# Patient Record
Sex: Male | Born: 1955
Health system: Southern US, Community
[De-identification: ages and names within clinical notes are randomized; demographics above are authoritative.]

## PROBLEM LIST (undated history)

## (undated) DIAGNOSIS — E669 Obesity, unspecified: Secondary | ICD-10-CM

## (undated) DIAGNOSIS — E785 Hyperlipidemia, unspecified: Secondary | ICD-10-CM

## (undated) DIAGNOSIS — I10 Essential (primary) hypertension: Secondary | ICD-10-CM

## (undated) DIAGNOSIS — R0602 Shortness of breath: Secondary | ICD-10-CM

## (undated) DIAGNOSIS — I639 Cerebral infarction, unspecified: Secondary | ICD-10-CM

## (undated) DIAGNOSIS — G4700 Insomnia, unspecified: Secondary | ICD-10-CM

## (undated) DIAGNOSIS — F319 Bipolar disorder, unspecified: Secondary | ICD-10-CM

## (undated) DIAGNOSIS — R0902 Hypoxemia: Secondary | ICD-10-CM

## (undated) DIAGNOSIS — F1910 Other psychoactive substance abuse, uncomplicated: Secondary | ICD-10-CM

## (undated) DIAGNOSIS — K219 Gastro-esophageal reflux disease without esophagitis: Secondary | ICD-10-CM

## (undated) HISTORY — DX: Bipolar disorder, unspecified: F31.9

## (undated) HISTORY — DX: Other psychoactive substance abuse, uncomplicated: F19.10

## (undated) HISTORY — DX: Obesity, unspecified: E66.9

## (undated) HISTORY — DX: Gastro-esophageal reflux disease without esophagitis: K21.9

## (undated) HISTORY — DX: Hypoxemia: R09.02

## (undated) HISTORY — DX: Hyperlipidemia, unspecified: E78.5

## (undated) HISTORY — PX: TONSILLECTOMY: SUR1361

## (undated) HISTORY — DX: Essential (primary) hypertension: I10

## (undated) HISTORY — DX: Insomnia, unspecified: G47.00

## (undated) HISTORY — DX: Shortness of breath: R06.02

## (undated) HISTORY — PX: CARPAL TUNNEL RELEASE: SHX101

## (undated) HISTORY — DX: Cerebral infarction, unspecified: I63.9

---

## 2004-02-14 ENCOUNTER — Other Ambulatory Visit: Payer: Self-pay

## 2004-08-05 ENCOUNTER — Emergency Department: Payer: Self-pay | Admitting: Emergency Medicine

## 2006-04-27 ENCOUNTER — Ambulatory Visit: Payer: Self-pay | Admitting: Family Medicine

## 2010-04-08 ENCOUNTER — Inpatient Hospital Stay: Payer: Self-pay | Admitting: Psychiatry

## 2010-09-18 ENCOUNTER — Inpatient Hospital Stay: Payer: Self-pay | Admitting: Psychiatry

## 2011-02-21 ENCOUNTER — Emergency Department: Payer: Self-pay | Admitting: Emergency Medicine

## 2011-04-02 ENCOUNTER — Inpatient Hospital Stay: Payer: Self-pay | Admitting: Psychiatry

## 2011-04-11 ENCOUNTER — Inpatient Hospital Stay: Payer: Self-pay | Admitting: Psychiatry

## 2011-04-21 ENCOUNTER — Inpatient Hospital Stay: Payer: Self-pay | Admitting: Unknown Physician Specialty

## 2012-08-05 LAB — ETHANOL
Ethanol %: <0.003 % (ref 0.000–0.080)
Ethanol: <3 mg/dL

## 2012-08-05 LAB — COMPREHENSIVE METABOLIC PANEL
Albumin: 3.9 g/dL (ref 3.4–5.0)
Alkaline Phosphatase: 164 U/L — ABNORMAL HIGH (ref 50–136)
Anion Gap: 10 (ref 7–16)
BUN: 7 mg/dL (ref 7–18)
Bilirubin,Total: 0.2 mg/dL (ref 0.2–1.0)
Calcium, Total: 8.4 mg/dL — ABNORMAL LOW (ref 8.5–10.1)
Chloride: 102 mmol/L (ref 98–107)
Co2: 23 mmol/L (ref 21–32)
Creatinine: 0.84 mg/dL (ref 0.60–1.30)
EGFR (African American): >60
EGFR (Non-African Amer.): >60
Glucose: 88 mg/dL (ref 65–99)
Osmolality: 267 (ref 275–301)
Potassium: 3.5 mmol/L (ref 3.5–5.1)
SGOT(AST): 16 U/L (ref 15–37)
SGPT (ALT): 19 U/L (ref 12–78)
Sodium: 135 mmol/L — ABNORMAL LOW (ref 136–145)
Total Protein: 7.9 g/dL (ref 6.4–8.2)

## 2012-08-05 LAB — CBC
HCT: 40.4 % (ref 40.0–52.0)
HGB: 13.8 g/dL (ref 13.0–18.0)
MCH: 29.9 pg (ref 26.0–34.0)
MCHC: 34.3 g/dL (ref 32.0–36.0)
MCV: 87 fL (ref 80–100)
Platelet: 250 10*3/uL (ref 150–440)
RBC: 4.63 10*6/uL (ref 4.40–5.90)
RDW: 13.8 % (ref 11.5–14.5)
WBC: 13.7 10*3/uL — ABNORMAL HIGH (ref 3.8–10.6)

## 2012-08-05 LAB — CK TOTAL AND CKMB (NOT AT ARMC)
CK, Total: 94 U/L (ref 35–232)
CK-MB: 1.2 ng/mL (ref 0.5–3.6)

## 2012-08-05 LAB — TSH: Thyroid Stimulating Horm: 0.78 u[IU]/mL

## 2012-08-05 LAB — TROPONIN I: Troponin-I: <0.02 ng/mL

## 2012-08-06 ENCOUNTER — Inpatient Hospital Stay: Payer: Self-pay | Admitting: Psychiatry

## 2012-08-06 LAB — URINALYSIS, COMPLETE
Bacteria: NONE SEEN
Bilirubin,UR: NEGATIVE
Blood: NEGATIVE
Glucose,UR: NEGATIVE mg/dL (ref 0–75)
Hyaline Cast: 1
Ketone: NEGATIVE
Leukocyte Esterase: NEGATIVE
Nitrite: NEGATIVE
Ph: 7 (ref 4.5–8.0)
Protein: 30
RBC,UR: 1 /HPF (ref 0–5)
Specific Gravity: 1.011 (ref 1.003–1.030)
Squamous Epithelial: NONE SEEN
WBC UR: 1 /HPF (ref 0–5)

## 2012-08-06 LAB — DRUG SCREEN, URINE
Amphetamines, Ur Screen: NEGATIVE (ref ?–1000)
Barbiturates, Ur Screen: NEGATIVE (ref ?–200)
Benzodiazepine, Ur Scrn: NEGATIVE (ref ?–200)
Cannabinoid 50 Ng, Ur ~~LOC~~: NEGATIVE (ref ?–50)
Cocaine Metabolite,Ur ~~LOC~~: POSITIVE (ref ?–300)
MDMA (Ecstasy)Ur Screen: NEGATIVE (ref ?–500)
Methadone, Ur Screen: NEGATIVE (ref ?–300)
Opiate, Ur Screen: NEGATIVE (ref ?–300)
Phencyclidine (PCP) Ur S: NEGATIVE (ref ?–25)
Tricyclic, Ur Screen: NEGATIVE (ref ?–1000)

## 2012-08-06 LAB — CK TOTAL AND CKMB (NOT AT ARMC)
CK, Total: 85 U/L (ref 35–232)
CK-MB: 1.3 ng/mL (ref 0.5–3.6)

## 2012-08-06 LAB — TROPONIN I: Troponin-I: <0.02 ng/mL

## 2012-08-09 DIAGNOSIS — I639 Cerebral infarction, unspecified: Secondary | ICD-10-CM

## 2012-08-09 HISTORY — DX: Cerebral infarction, unspecified: I63.9

## 2013-03-23 DIAGNOSIS — I10 Essential (primary) hypertension: Secondary | ICD-10-CM | POA: Diagnosis not present

## 2013-03-23 DIAGNOSIS — H60399 Other infective otitis externa, unspecified ear: Secondary | ICD-10-CM | POA: Diagnosis not present

## 2013-03-23 DIAGNOSIS — R000 Tachycardia, unspecified: Secondary | ICD-10-CM | POA: Diagnosis not present

## 2013-03-30 DIAGNOSIS — H60399 Other infective otitis externa, unspecified ear: Secondary | ICD-10-CM | POA: Diagnosis not present

## 2013-03-30 DIAGNOSIS — R5381 Other malaise: Secondary | ICD-10-CM | POA: Diagnosis not present

## 2013-03-30 DIAGNOSIS — R7309 Other abnormal glucose: Secondary | ICD-10-CM | POA: Diagnosis not present

## 2013-03-30 DIAGNOSIS — I10 Essential (primary) hypertension: Secondary | ICD-10-CM | POA: Diagnosis not present

## 2013-04-26 DIAGNOSIS — R5381 Other malaise: Secondary | ICD-10-CM | POA: Diagnosis not present

## 2013-04-26 DIAGNOSIS — I10 Essential (primary) hypertension: Secondary | ICD-10-CM | POA: Diagnosis not present

## 2013-05-02 DIAGNOSIS — F39 Unspecified mood [affective] disorder: Secondary | ICD-10-CM | POA: Diagnosis not present

## 2013-05-04 DIAGNOSIS — Z23 Encounter for immunization: Secondary | ICD-10-CM | POA: Diagnosis not present

## 2013-05-04 DIAGNOSIS — J069 Acute upper respiratory infection, unspecified: Secondary | ICD-10-CM | POA: Diagnosis not present

## 2013-05-04 DIAGNOSIS — E785 Hyperlipidemia, unspecified: Secondary | ICD-10-CM | POA: Diagnosis not present

## 2013-05-04 DIAGNOSIS — I10 Essential (primary) hypertension: Secondary | ICD-10-CM | POA: Diagnosis not present

## 2013-05-04 DIAGNOSIS — R7309 Other abnormal glucose: Secondary | ICD-10-CM | POA: Diagnosis not present

## 2013-05-16 DIAGNOSIS — F39 Unspecified mood [affective] disorder: Secondary | ICD-10-CM | POA: Diagnosis not present

## 2013-09-14 DIAGNOSIS — I10 Essential (primary) hypertension: Secondary | ICD-10-CM | POA: Diagnosis not present

## 2013-09-14 DIAGNOSIS — E785 Hyperlipidemia, unspecified: Secondary | ICD-10-CM | POA: Diagnosis not present

## 2013-09-14 DIAGNOSIS — R079 Chest pain, unspecified: Secondary | ICD-10-CM | POA: Diagnosis not present

## 2013-09-14 DIAGNOSIS — R7309 Other abnormal glucose: Secondary | ICD-10-CM | POA: Diagnosis not present

## 2013-09-17 DIAGNOSIS — R079 Chest pain, unspecified: Secondary | ICD-10-CM | POA: Diagnosis not present

## 2013-09-19 DIAGNOSIS — I10 Essential (primary) hypertension: Secondary | ICD-10-CM | POA: Diagnosis not present

## 2013-09-19 DIAGNOSIS — J449 Chronic obstructive pulmonary disease, unspecified: Secondary | ICD-10-CM | POA: Diagnosis not present

## 2013-09-19 DIAGNOSIS — R079 Chest pain, unspecified: Secondary | ICD-10-CM | POA: Diagnosis not present

## 2013-09-20 ENCOUNTER — Inpatient Hospital Stay: Payer: Self-pay | Admitting: Internal Medicine

## 2013-09-20 DIAGNOSIS — R55 Syncope and collapse: Secondary | ICD-10-CM | POA: Diagnosis not present

## 2013-09-20 DIAGNOSIS — M79609 Pain in unspecified limb: Secondary | ICD-10-CM | POA: Diagnosis not present

## 2013-09-20 DIAGNOSIS — F17200 Nicotine dependence, unspecified, uncomplicated: Secondary | ICD-10-CM | POA: Diagnosis not present

## 2013-09-20 DIAGNOSIS — I10 Essential (primary) hypertension: Secondary | ICD-10-CM | POA: Diagnosis not present

## 2013-09-20 DIAGNOSIS — I2699 Other pulmonary embolism without acute cor pulmonale: Secondary | ICD-10-CM | POA: Diagnosis not present

## 2013-09-20 DIAGNOSIS — E785 Hyperlipidemia, unspecified: Secondary | ICD-10-CM | POA: Diagnosis not present

## 2013-09-20 DIAGNOSIS — R0789 Other chest pain: Secondary | ICD-10-CM | POA: Diagnosis not present

## 2013-09-20 DIAGNOSIS — J449 Chronic obstructive pulmonary disease, unspecified: Secondary | ICD-10-CM | POA: Diagnosis not present

## 2013-09-20 DIAGNOSIS — I200 Unstable angina: Secondary | ICD-10-CM | POA: Diagnosis not present

## 2013-09-20 DIAGNOSIS — R079 Chest pain, unspecified: Secondary | ICD-10-CM | POA: Diagnosis not present

## 2013-09-20 LAB — CBC WITH DIFFERENTIAL/PLATELET
Basophil #: 0.2 10*3/uL — ABNORMAL HIGH (ref 0.0–0.1)
Basophil %: 2.7 %
Eosinophil #: 0.5 10*3/uL (ref 0.0–0.7)
Eosinophil %: 5.7 %
HCT: 37.2 % — ABNORMAL LOW (ref 40.0–52.0)
HGB: 12.8 g/dL — ABNORMAL LOW (ref 13.0–18.0)
Lymphocyte #: 2.3 10*3/uL (ref 1.0–3.6)
Lymphocyte %: 25.4 %
MCH: 30.5 pg (ref 26.0–34.0)
MCHC: 34.4 g/dL (ref 32.0–36.0)
MCV: 88 fL (ref 80–100)
Monocyte #: 0.9 x10 3/mm (ref 0.2–1.0)
Monocyte %: 10.4 %
Neutrophil #: 5 10*3/uL (ref 1.4–6.5)
Neutrophil %: 55.8 %
Platelet: 237 10*3/uL (ref 150–440)
RBC: 4.21 10*6/uL — ABNORMAL LOW (ref 4.40–5.90)
RDW: 15.2 % — ABNORMAL HIGH (ref 11.5–14.5)
WBC: 9 10*3/uL (ref 3.8–10.6)

## 2013-09-20 LAB — DRUG SCREEN, URINE
Amphetamines, Ur Screen: NEGATIVE (ref ?–1000)
Barbiturates, Ur Screen: NEGATIVE (ref ?–200)
Benzodiazepine, Ur Scrn: POSITIVE (ref ?–200)
Cannabinoid 50 Ng, Ur ~~LOC~~: NEGATIVE (ref ?–50)
Cocaine Metabolite,Ur ~~LOC~~: POSITIVE (ref ?–300)
MDMA (Ecstasy)Ur Screen: NEGATIVE (ref ?–500)
Methadone, Ur Screen: NEGATIVE (ref ?–300)
Opiate, Ur Screen: NEGATIVE (ref ?–300)
Phencyclidine (PCP) Ur S: NEGATIVE (ref ?–25)
Tricyclic, Ur Screen: NEGATIVE (ref ?–1000)

## 2013-09-20 LAB — COMPREHENSIVE METABOLIC PANEL
Albumin: 3.5 g/dL (ref 3.4–5.0)
Alkaline Phosphatase: 107 U/L
Anion Gap: 7 (ref 7–16)
BUN: 16 mg/dL (ref 7–18)
Bilirubin,Total: 0.2 mg/dL (ref 0.2–1.0)
Calcium, Total: 8.5 mg/dL (ref 8.5–10.1)
Chloride: 105 mmol/L (ref 98–107)
Co2: 26 mmol/L (ref 21–32)
Creatinine: 1.01 mg/dL (ref 0.60–1.30)
EGFR (African American): >60
EGFR (Non-African Amer.): >60
Glucose: 81 mg/dL (ref 65–99)
Osmolality: 276 (ref 275–301)
Potassium: 3.5 mmol/L (ref 3.5–5.1)
SGOT(AST): 9 U/L — ABNORMAL LOW (ref 15–37)
SGPT (ALT): 16 U/L (ref 12–78)
Sodium: 138 mmol/L (ref 136–145)
Total Protein: 7.3 g/dL (ref 6.4–8.2)

## 2013-09-20 LAB — TROPONIN I
Troponin-I: <0.02 ng/mL
Troponin-I: <0.02 ng/mL
Troponin-I: <0.02 ng/mL

## 2013-09-20 LAB — URINALYSIS, COMPLETE
Bacteria: NONE SEEN
Bilirubin,UR: NEGATIVE
Blood: NEGATIVE
Glucose,UR: NEGATIVE mg/dL (ref 0–75)
Ketone: NEGATIVE
Leukocyte Esterase: NEGATIVE
Nitrite: NEGATIVE
Ph: 6 (ref 4.5–8.0)
Protein: NEGATIVE
RBC,UR: NONE SEEN /HPF (ref 0–5)
Specific Gravity: 1.01 (ref 1.003–1.030)
Squamous Epithelial: NONE SEEN
WBC UR: NONE SEEN /HPF (ref 0–5)

## 2013-09-20 LAB — APTT
Activated PTT: 39.2 secs — ABNORMAL HIGH (ref 23.6–35.9)
Activated PTT: 42.8 secs — ABNORMAL HIGH (ref 23.6–35.9)

## 2013-09-20 LAB — PRO B NATRIURETIC PEPTIDE: B-Type Natriuretic Peptide: 1167 pg/mL — ABNORMAL HIGH (ref 0–125)

## 2013-09-21 DIAGNOSIS — I2699 Other pulmonary embolism without acute cor pulmonale: Secondary | ICD-10-CM | POA: Diagnosis not present

## 2013-09-21 DIAGNOSIS — R079 Chest pain, unspecified: Secondary | ICD-10-CM | POA: Diagnosis not present

## 2013-09-21 DIAGNOSIS — R222 Localized swelling, mass and lump, trunk: Secondary | ICD-10-CM | POA: Diagnosis not present

## 2013-09-21 DIAGNOSIS — R0602 Shortness of breath: Secondary | ICD-10-CM | POA: Diagnosis not present

## 2013-09-21 DIAGNOSIS — I200 Unstable angina: Secondary | ICD-10-CM | POA: Diagnosis not present

## 2013-09-21 DIAGNOSIS — J9600 Acute respiratory failure, unspecified whether with hypoxia or hypercapnia: Secondary | ICD-10-CM | POA: Diagnosis not present

## 2013-09-21 LAB — LIPID PANEL
Cholesterol: 210 mg/dL — ABNORMAL HIGH (ref 0–200)
HDL Cholesterol: 59 mg/dL (ref 40–60)
Ldl Cholesterol, Calc: 130 mg/dL — ABNORMAL HIGH (ref 0–100)
Triglycerides: 106 mg/dL (ref 0–200)
VLDL Cholesterol, Calc: 21 mg/dL (ref 5–40)

## 2013-09-21 LAB — BASIC METABOLIC PANEL
Anion Gap: 4 — ABNORMAL LOW (ref 7–16)
BUN: 17 mg/dL (ref 7–18)
Calcium, Total: 8.5 mg/dL (ref 8.5–10.1)
Chloride: 103 mmol/L (ref 98–107)
Co2: 28 mmol/L (ref 21–32)
Creatinine: 1.12 mg/dL (ref 0.60–1.30)
EGFR (African American): >60
EGFR (Non-African Amer.): >60
Glucose: 146 mg/dL — ABNORMAL HIGH (ref 65–99)
Osmolality: 274 (ref 275–301)
Potassium: 4.1 mmol/L (ref 3.5–5.1)
Sodium: 135 mmol/L — ABNORMAL LOW (ref 136–145)

## 2013-09-21 LAB — CBC WITH DIFFERENTIAL/PLATELET
Basophil #: 0.1 10*3/uL (ref 0.0–0.1)
Basophil %: 1.1 %
Eosinophil #: 0 10*3/uL (ref 0.0–0.7)
Eosinophil %: 0 %
HCT: 36.4 % — ABNORMAL LOW (ref 40.0–52.0)
HGB: 12.2 g/dL — ABNORMAL LOW (ref 13.0–18.0)
Lymphocyte #: 1.1 10*3/uL (ref 1.0–3.6)
Lymphocyte %: 9.7 %
MCH: 29.7 pg (ref 26.0–34.0)
MCHC: 33.3 g/dL (ref 32.0–36.0)
MCV: 89 fL (ref 80–100)
Monocyte #: 0.3 x10 3/mm (ref 0.2–1.0)
Monocyte %: 2.3 %
Neutrophil #: 9.8 10*3/uL — ABNORMAL HIGH (ref 1.4–6.5)
Neutrophil %: 86.9 %
Platelet: 250 10*3/uL (ref 150–440)
RBC: 4.09 10*6/uL — ABNORMAL LOW (ref 4.40–5.90)
RDW: 15.6 % — ABNORMAL HIGH (ref 11.5–14.5)
WBC: 11.3 10*3/uL — ABNORMAL HIGH (ref 3.8–10.6)

## 2013-09-21 LAB — APTT
Activated PTT: 54.2 secs — ABNORMAL HIGH (ref 23.6–35.9)
Activated PTT: 45.8 secs — ABNORMAL HIGH (ref 23.6–35.9)

## 2013-09-24 DIAGNOSIS — E785 Hyperlipidemia, unspecified: Secondary | ICD-10-CM | POA: Diagnosis not present

## 2013-09-24 DIAGNOSIS — R7309 Other abnormal glucose: Secondary | ICD-10-CM | POA: Diagnosis not present

## 2013-09-24 DIAGNOSIS — M949 Disorder of cartilage, unspecified: Secondary | ICD-10-CM | POA: Diagnosis not present

## 2013-09-24 DIAGNOSIS — F209 Schizophrenia, unspecified: Secondary | ICD-10-CM | POA: Diagnosis not present

## 2013-09-24 DIAGNOSIS — R079 Chest pain, unspecified: Secondary | ICD-10-CM | POA: Diagnosis not present

## 2013-09-24 DIAGNOSIS — Z79899 Other long term (current) drug therapy: Secondary | ICD-10-CM | POA: Diagnosis not present

## 2013-09-24 DIAGNOSIS — R0789 Other chest pain: Secondary | ICD-10-CM | POA: Diagnosis not present

## 2013-09-24 DIAGNOSIS — F17200 Nicotine dependence, unspecified, uncomplicated: Secondary | ICD-10-CM | POA: Diagnosis not present

## 2013-09-24 DIAGNOSIS — J441 Chronic obstructive pulmonary disease with (acute) exacerbation: Secondary | ICD-10-CM | POA: Diagnosis not present

## 2013-09-24 DIAGNOSIS — Z86711 Personal history of pulmonary embolism: Secondary | ICD-10-CM | POA: Diagnosis not present

## 2013-09-24 DIAGNOSIS — F319 Bipolar disorder, unspecified: Secondary | ICD-10-CM | POA: Diagnosis not present

## 2013-09-24 DIAGNOSIS — I10 Essential (primary) hypertension: Secondary | ICD-10-CM | POA: Diagnosis not present

## 2013-09-24 DIAGNOSIS — R0602 Shortness of breath: Secondary | ICD-10-CM | POA: Diagnosis not present

## 2013-09-24 DIAGNOSIS — R918 Other nonspecific abnormal finding of lung field: Secondary | ICD-10-CM | POA: Diagnosis not present

## 2013-09-24 DIAGNOSIS — I2699 Other pulmonary embolism without acute cor pulmonale: Secondary | ICD-10-CM | POA: Diagnosis not present

## 2013-09-24 DIAGNOSIS — M899 Disorder of bone, unspecified: Secondary | ICD-10-CM | POA: Diagnosis not present

## 2013-09-24 DIAGNOSIS — J449 Chronic obstructive pulmonary disease, unspecified: Secondary | ICD-10-CM | POA: Diagnosis not present

## 2013-09-26 ENCOUNTER — Inpatient Hospital Stay: Payer: Self-pay | Admitting: Internal Medicine

## 2013-09-26 DIAGNOSIS — I6350 Cerebral infarction due to unspecified occlusion or stenosis of unspecified cerebral artery: Secondary | ICD-10-CM | POA: Diagnosis not present

## 2013-09-26 DIAGNOSIS — I10 Essential (primary) hypertension: Secondary | ICD-10-CM | POA: Diagnosis not present

## 2013-09-26 DIAGNOSIS — R29818 Other symptoms and signs involving the nervous system: Secondary | ICD-10-CM | POA: Diagnosis not present

## 2013-09-26 DIAGNOSIS — I6529 Occlusion and stenosis of unspecified carotid artery: Secondary | ICD-10-CM | POA: Diagnosis not present

## 2013-09-26 DIAGNOSIS — R4789 Other speech disturbances: Secondary | ICD-10-CM | POA: Diagnosis not present

## 2013-09-26 DIAGNOSIS — R5381 Other malaise: Secondary | ICD-10-CM | POA: Diagnosis not present

## 2013-09-26 DIAGNOSIS — I658 Occlusion and stenosis of other precerebral arteries: Secondary | ICD-10-CM | POA: Diagnosis not present

## 2013-09-26 DIAGNOSIS — M6281 Muscle weakness (generalized): Secondary | ICD-10-CM | POA: Diagnosis not present

## 2013-09-26 LAB — CBC
HCT: 36.3 % — ABNORMAL LOW (ref 40.0–52.0)
HGB: 12.5 g/dL — ABNORMAL LOW (ref 13.0–18.0)
MCH: 30.7 pg (ref 26.0–34.0)
MCHC: 34.4 g/dL (ref 32.0–36.0)
MCV: 89 fL (ref 80–100)
Platelet: 230 10*3/uL (ref 150–440)
RBC: 4.06 10*6/uL — ABNORMAL LOW (ref 4.40–5.90)
RDW: 15.4 % — ABNORMAL HIGH (ref 11.5–14.5)
WBC: 9.3 10*3/uL (ref 3.8–10.6)

## 2013-09-26 LAB — DRUG SCREEN, URINE
Amphetamines, Ur Screen: NEGATIVE (ref ?–1000)
Barbiturates, Ur Screen: NEGATIVE (ref ?–200)
Benzodiazepine, Ur Scrn: POSITIVE (ref ?–200)
Cannabinoid 50 Ng, Ur ~~LOC~~: NEGATIVE (ref ?–50)
Cocaine Metabolite,Ur ~~LOC~~: POSITIVE (ref ?–300)
MDMA (Ecstasy)Ur Screen: NEGATIVE (ref ?–500)
Methadone, Ur Screen: NEGATIVE (ref ?–300)
Opiate, Ur Screen: NEGATIVE (ref ?–300)
Phencyclidine (PCP) Ur S: NEGATIVE (ref ?–25)
Tricyclic, Ur Screen: NEGATIVE (ref ?–1000)

## 2013-09-26 LAB — URINALYSIS, COMPLETE
Bacteria: NONE SEEN
Bilirubin,UR: NEGATIVE
Blood: NEGATIVE
Glucose,UR: NEGATIVE mg/dL (ref 0–75)
Hyaline Cast: 1
Ketone: NEGATIVE
Leukocyte Esterase: NEGATIVE
Nitrite: NEGATIVE
Ph: 6 (ref 4.5–8.0)
Protein: NEGATIVE
RBC,UR: 1 /HPF (ref 0–5)
Specific Gravity: 1.015 (ref 1.003–1.030)
Squamous Epithelial: NONE SEEN
WBC UR: 1 /HPF (ref 0–5)

## 2013-09-26 LAB — PROTIME-INR
INR: 1
Prothrombin Time: 12.9 secs (ref 11.5–14.7)

## 2013-09-26 LAB — COMPREHENSIVE METABOLIC PANEL
Albumin: 3.4 g/dL (ref 3.4–5.0)
Alkaline Phosphatase: 108 U/L
Anion Gap: 3 — ABNORMAL LOW (ref 7–16)
BUN: 11 mg/dL (ref 7–18)
Bilirubin,Total: 0.1 mg/dL — ABNORMAL LOW (ref 0.2–1.0)
Calcium, Total: 8.2 mg/dL — ABNORMAL LOW (ref 8.5–10.1)
Chloride: 106 mmol/L (ref 98–107)
Co2: 30 mmol/L (ref 21–32)
Creatinine: 1.12 mg/dL (ref 0.60–1.30)
EGFR (African American): >60
EGFR (Non-African Amer.): >60
Glucose: 86 mg/dL (ref 65–99)
Osmolality: 276 (ref 275–301)
Potassium: 3.3 mmol/L — ABNORMAL LOW (ref 3.5–5.1)
SGOT(AST): 19 U/L (ref 15–37)
SGPT (ALT): 15 U/L (ref 12–78)
Sodium: 139 mmol/L (ref 136–145)
Total Protein: 6.9 g/dL (ref 6.4–8.2)

## 2013-09-26 LAB — TROPONIN I: Troponin-I: <0.02 ng/mL

## 2013-09-27 LAB — BASIC METABOLIC PANEL
Anion Gap: 4 — ABNORMAL LOW (ref 7–16)
BUN: 8 mg/dL (ref 7–18)
Calcium, Total: 8.6 mg/dL (ref 8.5–10.1)
Chloride: 107 mmol/L (ref 98–107)
Co2: 27 mmol/L (ref 21–32)
Creatinine: 0.95 mg/dL (ref 0.60–1.30)
EGFR (African American): >60
EGFR (Non-African Amer.): >60
Glucose: 82 mg/dL (ref 65–99)
Osmolality: 273 (ref 275–301)
Potassium: 3.6 mmol/L (ref 3.5–5.1)
Sodium: 138 mmol/L (ref 136–145)

## 2013-09-27 LAB — CBC WITH DIFFERENTIAL/PLATELET
Basophil #: 0.1 10*3/uL (ref 0.0–0.1)
Basophil %: 0.6 %
Eosinophil #: 0.3 10*3/uL (ref 0.0–0.7)
Eosinophil %: 3.7 %
HCT: 37 % — ABNORMAL LOW (ref 40.0–52.0)
HGB: 12.6 g/dL — ABNORMAL LOW (ref 13.0–18.0)
Lymphocyte #: 1.7 10*3/uL (ref 1.0–3.6)
Lymphocyte %: 18.3 %
MCH: 30.3 pg (ref 26.0–34.0)
MCHC: 34.1 g/dL (ref 32.0–36.0)
MCV: 89 fL (ref 80–100)
Monocyte #: 0.8 x10 3/mm (ref 0.2–1.0)
Monocyte %: 8.3 %
Neutrophil #: 6.4 10*3/uL (ref 1.4–6.5)
Neutrophil %: 69.1 %
Platelet: 207 10*3/uL (ref 150–440)
RBC: 4.16 10*6/uL — ABNORMAL LOW (ref 4.40–5.90)
RDW: 15.8 % — ABNORMAL HIGH (ref 11.5–14.5)
WBC: 9.2 10*3/uL (ref 3.8–10.6)

## 2013-09-27 LAB — LIPID PANEL
Cholesterol: 182 mg/dL (ref 0–200)
HDL Cholesterol: 34 mg/dL — ABNORMAL LOW (ref 40–60)
Ldl Cholesterol, Calc: 81 mg/dL (ref 0–100)
Triglycerides: 334 mg/dL — ABNORMAL HIGH (ref 0–200)
VLDL Cholesterol, Calc: 67 mg/dL — ABNORMAL HIGH (ref 5–40)

## 2013-09-28 ENCOUNTER — Other Ambulatory Visit: Payer: Self-pay | Admitting: Physical Medicine and Rehabilitation

## 2013-09-28 ENCOUNTER — Inpatient Hospital Stay (HOSPITAL_COMMUNITY)
Admission: RE | Admit: 2013-09-28 | Discharge: 2013-10-11 | DRG: 945 | Disposition: A | Discharge status: Home Health | Payer: Medicare Other | Source: Intra-hospital | Attending: Physical Medicine & Rehabilitation | Admitting: Physical Medicine & Rehabilitation

## 2013-09-28 ENCOUNTER — Encounter: Payer: Self-pay | Admitting: Physical Medicine and Rehabilitation

## 2013-09-28 ENCOUNTER — Encounter: Payer: Self-pay | Admitting: *Deleted

## 2013-09-28 DIAGNOSIS — J440 Chronic obstructive pulmonary disease with acute lower respiratory infection: Secondary | ICD-10-CM | POA: Diagnosis not present

## 2013-09-28 DIAGNOSIS — R51 Headache: Secondary | ICD-10-CM | POA: Diagnosis present

## 2013-09-28 DIAGNOSIS — I639 Cerebral infarction, unspecified: Secondary | ICD-10-CM | POA: Insufficient documentation

## 2013-09-28 DIAGNOSIS — R29810 Facial weakness: Secondary | ICD-10-CM | POA: Diagnosis present

## 2013-09-28 DIAGNOSIS — J4489 Other specified chronic obstructive pulmonary disease: Secondary | ICD-10-CM | POA: Diagnosis present

## 2013-09-28 DIAGNOSIS — D72829 Elevated white blood cell count, unspecified: Secondary | ICD-10-CM | POA: Diagnosis not present

## 2013-09-28 DIAGNOSIS — R29898 Other symptoms and signs involving the musculoskeletal system: Secondary | ICD-10-CM | POA: Diagnosis present

## 2013-09-28 DIAGNOSIS — I6930 Unspecified sequelae of cerebral infarction: Secondary | ICD-10-CM | POA: Diagnosis present

## 2013-09-28 DIAGNOSIS — F17200 Nicotine dependence, unspecified, uncomplicated: Secondary | ICD-10-CM | POA: Diagnosis present

## 2013-09-28 DIAGNOSIS — Z91199 Patient's noncompliance with other medical treatment and regimen due to unspecified reason: Secondary | ICD-10-CM

## 2013-09-28 DIAGNOSIS — G4700 Insomnia, unspecified: Secondary | ICD-10-CM | POA: Diagnosis present

## 2013-09-28 DIAGNOSIS — J209 Acute bronchitis, unspecified: Secondary | ICD-10-CM | POA: Diagnosis not present

## 2013-09-28 DIAGNOSIS — Z5189 Encounter for other specified aftercare: Principal | ICD-10-CM

## 2013-09-28 DIAGNOSIS — G8190 Hemiplegia, unspecified affecting unspecified side: Secondary | ICD-10-CM | POA: Diagnosis present

## 2013-09-28 DIAGNOSIS — K219 Gastro-esophageal reflux disease without esophagitis: Secondary | ICD-10-CM | POA: Diagnosis present

## 2013-09-28 DIAGNOSIS — R471 Dysarthria and anarthria: Secondary | ICD-10-CM | POA: Diagnosis present

## 2013-09-28 DIAGNOSIS — J449 Chronic obstructive pulmonary disease, unspecified: Secondary | ICD-10-CM | POA: Diagnosis present

## 2013-09-28 DIAGNOSIS — G2581 Restless legs syndrome: Secondary | ICD-10-CM | POA: Diagnosis present

## 2013-09-28 DIAGNOSIS — F32A Depression, unspecified: Secondary | ICD-10-CM

## 2013-09-28 DIAGNOSIS — R4789 Other speech disturbances: Secondary | ICD-10-CM | POA: Diagnosis present

## 2013-09-28 DIAGNOSIS — I6350 Cerebral infarction due to unspecified occlusion or stenosis of unspecified cerebral artery: Secondary | ICD-10-CM | POA: Insufficient documentation

## 2013-09-28 DIAGNOSIS — E785 Hyperlipidemia, unspecified: Secondary | ICD-10-CM | POA: Diagnosis present

## 2013-09-28 DIAGNOSIS — F1410 Cocaine abuse, uncomplicated: Secondary | ICD-10-CM | POA: Diagnosis present

## 2013-09-28 DIAGNOSIS — F319 Bipolar disorder, unspecified: Secondary | ICD-10-CM | POA: Diagnosis present

## 2013-09-28 DIAGNOSIS — I10 Essential (primary) hypertension: Secondary | ICD-10-CM | POA: Diagnosis not present

## 2013-09-28 DIAGNOSIS — Z9119 Patient's noncompliance with other medical treatment and regimen: Secondary | ICD-10-CM | POA: Diagnosis not present

## 2013-09-28 DIAGNOSIS — E871 Hypo-osmolality and hyponatremia: Secondary | ICD-10-CM | POA: Diagnosis not present

## 2013-09-28 DIAGNOSIS — F329 Major depressive disorder, single episode, unspecified: Secondary | ICD-10-CM

## 2013-09-28 LAB — GLUCOSE, CAPILLARY
Glucose-Capillary: 87 mg/dL (ref 70–99)
Glucose-Capillary: 79 mg/dL (ref 70–99)

## 2013-09-28 MED ORDER — ALUM & MAG HYDROXIDE-SIMETH 200-200-20 MG/5ML PO SUSP: 30.0000 mL | ORAL | Status: DC | PRN

## 2013-09-28 MED ORDER — GUAIFENESIN-DM 100-10 MG/5ML PO SYRP
5.0000 mL | ORAL_SOLUTION | Freq: Four times a day (QID) | ORAL | Status: DC | PRN
  Administered 2013-10-02 – 2013-10-08 (×10): 10 mL via ORAL
  Administered 2013-10-09 – 2013-10-10 (×2): 5 mL via ORAL
  Filled 2013-09-28 (×5): qty 10
  Filled 2013-09-28: qty 5
  Filled 2013-09-28 (×3): qty 10
  Filled 2013-09-28: qty 5
  Filled 2013-09-28 (×2): qty 10

## 2013-09-28 MED ORDER — LISINOPRIL 20 MG PO TABS
20.0000 mg | ORAL_TABLET | Freq: Every day | ORAL | Status: DC
  Filled 2013-09-28 (×2): qty 1

## 2013-09-28 MED ORDER — DIAZEPAM 5 MG PO TABS
10.0000 mg | ORAL_TABLET | Freq: Two times a day (BID) | ORAL | Status: DC
  Administered 2013-09-28 – 2013-10-11 (×26): 10 mg via ORAL
  Filled 2013-09-28 (×26): qty 2

## 2013-09-28 MED ORDER — PROCHLORPERAZINE EDISYLATE 5 MG/ML IJ SOLN
5.0000 mg | Freq: Four times a day (QID) | INTRAMUSCULAR | Status: DC | PRN
  Filled 2013-09-28: qty 2

## 2013-09-28 MED ORDER — ASPIRIN EC 325 MG PO TBEC
325.0000 mg | DELAYED_RELEASE_TABLET | Freq: Every day | ORAL | Status: DC
  Administered 2013-09-28 – 2013-10-11 (×14): 325 mg via ORAL
  Filled 2013-09-28 (×15): qty 1

## 2013-09-28 MED ORDER — INSULIN ASPART 100 UNIT/ML ~~LOC~~ SOLN
0.0000 [IU] | Freq: Three times a day (TID) | SUBCUTANEOUS | Status: DC
  Administered 2013-09-30 – 2013-10-01 (×2): 1 [IU] via SUBCUTANEOUS

## 2013-09-28 MED ORDER — FLUOXETINE HCL 20 MG PO CAPS
20.0000 mg | ORAL_CAPSULE | Freq: Every day | ORAL | Status: DC
  Administered 2013-09-29 – 2013-10-11 (×13): 20 mg via ORAL
  Filled 2013-09-28 (×14): qty 1

## 2013-09-28 MED ORDER — PANTOPRAZOLE SODIUM 40 MG PO TBEC
40.0000 mg | DELAYED_RELEASE_TABLET | Freq: Every day | ORAL | Status: DC
  Administered 2013-09-28 – 2013-10-11 (×14): 40 mg via ORAL
  Filled 2013-09-28 (×15): qty 1

## 2013-09-28 MED ORDER — BISACODYL 10 MG RE SUPP
10.0000 mg | Freq: Every day | RECTAL | Status: DC | PRN
  Filled 2013-09-28: qty 1

## 2013-09-28 MED ORDER — DIPHENHYDRAMINE HCL 12.5 MG/5ML PO ELIX
12.5000 mg | ORAL_SOLUTION | Freq: Four times a day (QID) | ORAL | Status: DC | PRN
  Administered 2013-09-30 – 2013-10-10 (×14): 25 mg via ORAL
  Filled 2013-09-28 (×16): qty 10

## 2013-09-28 MED ORDER — ENOXAPARIN SODIUM 40 MG/0.4ML ~~LOC~~ SOLN
40.0000 mg | SUBCUTANEOUS | Status: DC
  Administered 2013-09-28 – 2013-10-07 (×10): 40 mg via SUBCUTANEOUS
  Filled 2013-09-28 (×11): qty 0.4

## 2013-09-28 MED ORDER — PROCHLORPERAZINE 25 MG RE SUPP
12.5000 mg | Freq: Four times a day (QID) | RECTAL | Status: DC | PRN
  Filled 2013-09-28: qty 1

## 2013-09-28 MED ORDER — ZOLPIDEM TARTRATE 5 MG PO TABS: 10.0000 mg | ORAL_TABLET | Freq: Every evening | ORAL | Status: DC | PRN

## 2013-09-28 MED ORDER — HYDROCHLOROTHIAZIDE 12.5 MG PO CAPS
12.5000 mg | ORAL_CAPSULE | Freq: Every day | ORAL | Status: DC
  Filled 2013-09-28 (×2): qty 1

## 2013-09-28 MED ORDER — ACETAMINOPHEN 325 MG PO TABS
325.0000 mg | ORAL_TABLET | ORAL | Status: DC | PRN
  Administered 2013-09-28 – 2013-10-11 (×29): 650 mg via ORAL
  Filled 2013-09-28 (×33): qty 2

## 2013-09-28 MED ORDER — METOPROLOL TARTRATE 50 MG PO TABS
150.0000 mg | ORAL_TABLET | Freq: Two times a day (BID) | ORAL | Status: DC
  Administered 2013-09-28 – 2013-10-10 (×25): 150 mg via ORAL
  Filled 2013-09-28 (×28): qty 1

## 2013-09-28 MED ORDER — CARBAMAZEPINE 200 MG PO TABS: 200.0000 mg | ORAL_TABLET | Freq: Two times a day (BID) | ORAL | Status: DC

## 2013-09-28 MED ORDER — CARBAMAZEPINE 200 MG PO TABS
200.0000 mg | ORAL_TABLET | Freq: Every day | ORAL | Status: DC
  Administered 2013-09-28 – 2013-10-11 (×14): 200 mg via ORAL
  Filled 2013-09-28 (×15): qty 1

## 2013-09-28 MED ORDER — FLEET ENEMA 7-19 GM/118ML RE ENEM: 1.0000 | ENEMA | Freq: Once | RECTAL | Status: AC | PRN

## 2013-09-28 MED ORDER — ISOSORBIDE MONONITRATE ER 30 MG PO TB24
30.0000 mg | ORAL_TABLET | Freq: Every day | ORAL | Status: DC
  Administered 2013-09-29 – 2013-10-07 (×9): 30 mg via ORAL
  Filled 2013-09-28 (×11): qty 1

## 2013-09-28 MED ORDER — CARBAMAZEPINE 200 MG PO TABS
400.0000 mg | ORAL_TABLET | Freq: Every day | ORAL | Status: DC
  Administered 2013-09-28 – 2013-10-10 (×13): 400 mg via ORAL
  Filled 2013-09-28 (×14): qty 2

## 2013-09-28 MED ORDER — PROCHLORPERAZINE MALEATE 5 MG PO TABS
5.0000 mg | ORAL_TABLET | Freq: Four times a day (QID) | ORAL | Status: DC | PRN
  Filled 2013-09-28: qty 2

## 2013-09-28 MED ORDER — ATORVASTATIN CALCIUM 40 MG PO TABS
40.0000 mg | ORAL_TABLET | Freq: Every day | ORAL | Status: DC
  Administered 2013-09-28 – 2013-10-10 (×13): 40 mg via ORAL
  Filled 2013-09-28 (×14): qty 1

## 2013-09-28 MED ORDER — ZOLPIDEM TARTRATE 5 MG PO TABS
10.0000 mg | ORAL_TABLET | Freq: Every day | ORAL | Status: DC
  Administered 2013-09-28 – 2013-10-10 (×13): 10 mg via ORAL
  Filled 2013-09-28 (×14): qty 2

## 2013-09-28 MED ORDER — ALBUTEROL SULFATE (2.5 MG/3ML) 0.083% IN NEBU
3.0000 mL | INHALATION_SOLUTION | Freq: Four times a day (QID) | RESPIRATORY_TRACT | Status: DC | PRN
  Administered 2013-10-07: 3 mL via RESPIRATORY_TRACT
  Filled 2013-09-28: qty 3

## 2013-09-28 MED ORDER — PNEUMOCOCCAL VAC POLYVALENT 25 MCG/0.5ML IJ INJ
0.5000 mL | INJECTION | INTRAMUSCULAR | Status: DC
  Filled 2013-09-28: qty 0.5

## 2013-09-28 NOTE — PMR Pre-admission (Signed)
[place] PMR Admission Coordinator Pre-Admission Assessment  Patient: David Hall is an 58 y.o., male MRN: [id number] DOB: 08/19/1955 Height: 5' 10"  (177.8 cm) Weight: 89.812 kg (198 lb)  Insurance Information HMO:     PPO:      PCP:      IPA:      80/20:      OTHER:  PRIMARY: Medicare A & B      Policy#: [id number]      Subscriber: self  CM Name:       Phone#:      Fax#:  Pre-Cert#: verified in Visual merchandiser: on disability Benefits:  Phone #:      Name:  Eff. Date: A & B: 09-09-12     Deduct: $1260      Out of Pocket Max: none      Life Max: unlimited CIR: 100%      SNF: 100% days 1-20; 80% days 21-100 Outpatient: 80%     Co-Pay: 20% Home Health: 100%      Co-Pay: none DME: 80%     Co-Pay: 20% Providers: pt's preference SECONDARY: Medicaid [place] Access      Policy#: [id number]      Subscriber: self  Confirmed with PCP: Carlyon Prows. NPI: [id number] per Terri 09/28/13  Emergency Contact Information Contact Information   Name Relation Home Work Mobile   [place] Daughter   [phone]      Current Medical History  Patient Admitting Diagnosis: L Frontal and L corona radiata CVA  History of Present Illness: This 58 yo male presented to [facility] on 09-26-13 with right sided weakness and slurred speech. Pt's symptoms occurred for more than 12 hours prior to admit. Pt did not call EMS because he felt he could get over it. With no improvement, he called EMS eventually. Pt presented to ER with new weakness in right UE/LE, impaired slurred speech and unsteady gait. Initial head CT negative for acute findings. MRI demonstrated L frontal and corona radiata CVA. Note that pt had recent admit at [facility] on 09-20-13 for chest pain and shortness of breath and received treatment for PE. Pt left AMA during that admit. Pt does have history of using cocaine. Pt has been participating well with skilled PT, OT and SLP and further acute  inpatient rehab was recommended. Pt is motivated to maximize his independence at inpatient rehab.   Patient's medical record from [facility] has been reviewed by the rehabilitation admission coordinator and physician.  Past Medical History  Hypertension Hyperlipidemia Prediabetes GERD Bipolar H/O Substance abuse ? H/O PE  Family History  CAD CVA  Prior Rehab/Hospitalizations: none   Current Medications See MAR  Patients Current Diet:  Mechanical soft, thin liquids, strict aspiration precautions, meds in puree  Precautions / Restrictions Precautions Precautions: Fall Precaution Comments: Aspiration precautions Restrictions Other Position/Activity Restrictions: flaccid R UE   Prior Activity Level Limited Community (1-2x/wk): was driving, independent with single point cane and mainly stayed at home (has been on disability due to HTN and is a household ambulator)  Development worker, international aid / [place] Devices/Equipment:  (pt lives in handicap accessible home (built that way for his mother)   Prior Functional Level Current Functional Level  Bed Mobility  Independent  Min assist   Transfers  Independent  Mod assist (Mod assist x 2) Hand held assist x 2  Mobility - Walk/Wheelchair  Independent (used a single point cane)  Other   Upper Body Dressing  Independent  Mod assist   Lower Body Dressing  Independent  Mod assist   Grooming  Independent  Mod assist   Eating/Drinking  Independent  Min assist (using L hand)   Toilet Transfer  Independent  Mod assist (mod assist x 2 to [facility])   Bladder Continence   WFL  using foley catheter   Bowel Management  WFL  very small BM on 09-27-13 Using [facility]  Stair Climbing  Independent   (not assessed)   Communication  WFL  dysarthric   Memory  [facility]  WFL   Cooking/Meal Prep  Independent      Housework  Independent    Money Management  Independent    Driving   Independent     Special needs/care consideration BiPAP/CPAP no CPM no Continuous Drip IV no Dialysis no          Life Vest no Oxygen no Special Bed no Trach Size no Wound Vac (area) no      Skin - has dry skin                            Bowel mgmt: very small BM on 09-27-13 using [facility] (mod A x 2) Bladder mgmt: using foley catheter Diabetic mgmt no  Previous Home Environment Living Arrangements: Children  Lives With: Daughter Available Help at Discharge: Family (Dtr Vishnu Moeller is avail. 24-7) Type of Home: [place]: One level Home Access: Stairs to enter Entrance Stairs-Rails: None Entrance Stairs-Number of Steps: 1 Bathroom Accessibility: Yes How Accessible: Accessible via wheelchair  Discharge Living Setting Plans for Discharge Living Setting: Patient's home Type of Home at Discharge: House Discharge Home Layout: One level Discharge Home Access: Stairs to enter Entrance Stairs-Rails: None Discharge Bathroom Accessibility: Yes How Accessible: Accessible via wheelchair Does the patient have any problems obtaining your medications?: No  Social/Family/Support Systems Contact Information: Cliff Damiani, dtr Anticipated Caregiver: Dtr Anticipated Caregiver's Contact Information: cell [phone] Ability/Limitations of Caregiver: no limitations, [name] is avail 24-7 Caregiver Availability: 24/7 Discharge Plan Discussed with Primary Caregiver: Yes Is Caregiver In Agreement with Plan?:  (discussed with pt and [name] on 09-27-13) Does Caregiver/Family have Issues with Lodging/Transportation while Pt is in Rehab?: No  Goals/Additional Needs Patient/Family Goal for Rehab: Min A to sup with PT/OT and sup w/ SLP Expected length of stay: 14-18 days Cultural Considerations: none Dietary Needs: mech soft, thin liquids, strict aspiration precautions, meds in puree Equipment Needs: to be determined Pt/Family Agrees to Admission and willing to participate: Yes Program  Orientation Provided & Reviewed with Pt/Caregiver Including Roles  & Responsibilities: Yes  Patient Condition: I met with pt and his daugther at [facility] on 09-27-13 and explained the purpose of acute inpatient rehabilitation. This patient was previously independent in mobility and in basic ADLs prior to his recent L frontal and corona radiata CVA. Pt is currently requiring moderate assist of 2 for limited transfers and basic ADL tasks. In addition, his speech is dysarthric and he is on a mechanical soft diet with strict aspiration precautions. Patient will also benefit from further skilled speech services to determine any cognitive issues associated with his frontal lobe CVA. He will benefit greatly from the multi-disciplinary team of skilled PT, OT,  SLP and rehab nursing to maximize his functional return following this new CVA. PT, OT, and rehab nursing to focus on increasing strength for greater independence in transfers, bed mobility, and ambulation.  In addition, pt will benefit from rehab physician intervention to monitor his recently diagnosed PE, hx of Bipolar and HTN issues in light of his new L frontal and corona radiata CVA. Discussed case with Dr. Naaman Plummer who stated that pt is a great inpatient rehab candidate and received medical clearance from Dr. Verdell Carmine at [facility]. Pt and his daughter are motivated to come to inpatient rehab and will benefit from the intensive services of skilled therapy under rehab physician guidance. Pt will be admitted today on 09-28-13.   Preadmission Screen Completed By:  Nanetta Batty, PT, 09/28/2013 10:32 AM ______________________________________________________________________   Discussed status with Dr. Naaman Plummer on 09-28-13 at 1110 and received telephone approval for admission today.  Admission Coordinator:  Nanetta Batty, PT time 1110/Date 09-28-13    Assessment/Plan: Diagnosis: left CVA 1. Does the need for close, 24 hr/day  Medical supervision in concert with the  patient's rehab needs make it unreasonable for this patient to be served in a less intensive setting? Yes 2. Co-Morbidities requiring supervision/potential complications: htn, GERD, bipolar 3. Due to bladder management, bowel management, safety, skin/wound care, disease management, medication administration, pain management and patient education, does the patient require 24 hr/day rehab nursing? Yes 4. Does the patient require coordinated care of a physician, rehab nurse, PT (1-2 hrs/day, 5 days/week), OT (1-2 hrs/day, 5 days/week) and SLP (1-2 hrs/day, 5 days/week) to address physical and functional deficits in the context of the above medical diagnosis(es)? Yes Addressing deficits in the following areas: balance, endurance, locomotion, strength, transferring, bowel/bladder control, bathing, dressing, feeding, grooming, toileting, cognition, speech and psychosocial support 5. Can the patient actively participate in an intensive therapy program of at least 3 hrs of therapy 5 days a week? Yes 6. The potential for patient to make measurable gains while on inpatient rehab is excellent 7. Anticipated functional outcomes upon discharge from inpatients are supervision to min assist PT, supervision to min assist OT, supervisionSLP 8. Estimated rehab length of stay to reach the above functional goals is: 14-18 days 9. Does the patient have adequate social supports to accommodate these discharge functional goals? Yes 10. Anticipated D/C setting: Home 11. Anticipated post D/C treatments: [place] therapy 12. Overall Rehab/Functional Prognosis: excellent    RECOMMENDATIONS: This patient's condition is appropriate for continued rehabilitative care in the following setting: CIR Patient has agreed to participate in recommended program. Yes Note that insurance prior authorization may be required for reimbursement for recommended care.  Comment: Admit to inpatient rehab today  Meredith Staggers, MD, [place], [place] 09/28/2013

## 2013-09-28 NOTE — H&P (Signed)
Physical Medicine and Rehabilitation Admission H&P      CC: Right sided weakness and slurred speech.    HPI: [name] is a 58 year old male with history of HTN, bipolar disorder, Crack cocaine use who was admitted on 02/181/5 with right sided weakness and slurred speech. Patient with history of medical non-compliance and left AMA past diagnosis of PE but reported to have negative work up at [facility]. UDS positive for cocaine (reported to have used 2 days PTA)  and benzos. MRI brain with acute lacunar infarct left corona radiata involving lentiform neucleus as well as IC and small subcortical infarct left anterior frontal lobe. Carotid dopplers with less than 50% B- ICA stenosis. 2D echo with EF of 50-55% with mild LVH, mild MVR. Patient placed on ASA for secondary stroke prevention and with resultant right sided weakness, right facial droop and mild dysarthria. He was evaluated by rehab team and CIR recommended for follow up therapies.    Review of Systems  HENT: Negative for hearing loss.   Eyes: Negative for blurred vision and double vision.  Respiratory: Positive for shortness of breath. Negative for cough.   Cardiovascular: Negative for chest pain and palpitations.  Gastrointestinal: Positive for heartburn and nausea. Negative for vomiting.  Genitourinary: Negative for urgency and frequency.  Musculoskeletal: Negative for back pain and myalgias.  Neurological: Positive for speech change and focal weakness. Negative for headaches.  Psychiatric/Behavioral: The patient is nervous/anxious and has insomnia.      Past Medical History   Diagnosis  Date   .  Hypertension     .  GERD (gastroesophageal reflux disease)     .  Bipolar disorder     .  COPD (chronic obstructive pulmonary disease)     .  Shortness of breath     .  Insomnia, controlled      No past surgical history on file.    Family History   Problem  Relation  Age of Onset   .  CAD  Mother     .  Lung disease  Father         Social History:  Lives with daughter. Disabled due to " medical issues". Used cane for ambulation PTA.  Smokes 1PPD. Does not use alcohol. Uses crack cocaine occasionally.       Allergies   Allergen  Reactions   .  Seroquel [Quetiapine Fumarate]      (Not in a hospital admission)   Home:    Functional History: Independent with cane PTA.   Functional Status:   Mobility: Min assist for bed mobility. Mod assist +2 persons with cues for safety/sequecing. Mod assist +2 to stand and step to chair. R-knee instability with weightbearing.          ADL:   Cognition:       Physical Exam  Nursing note and vitals reviewed. Constitutional: He is oriented to person, place, and time. He appears well-developed and well-nourished.  HENT:   Head: Normocephalic and atraumatic. Ear ring left ear Eyes: Conjunctivae are normal. Pupils are equal, round, and reactive to light.  Neck: Normal range of motion. Neck supple.  Cardiovascular: Normal rate and regular rhythm.  No murmurs Respiratory: Effort normal and breath sounds normal. No respiratory distress. He has no wheezes. No rales GI: Soft. Bowel sounds are normal. He exhibits no distension. There is no tenderness.  Musculoskeletal: He exhibits trace edema RUE.[name]  Neurological: He is alert and oriented to person, place, and time.  Mild dysarthria with occasional word finding difficulties. Able to ID 3/3 simple objects, but sometimes stutters.  Mild facial weakness with minimal tongue deviation.  Follows basic commands without difficulty. Right hemiparesis: RUE 0/5 deltoid, bicep, tricep, HI. RLE 2- HF, 1+ KE, 1+ ADF and APF. Sensation appears grossly intact RUE and RLE. DTR's 2+ Right arm and leg. 1+ Left side. Reasonable insight and awareness. Skin: Skin is warm and dry.  Psych: generally pleasant and cooperative   No results found for this or any previous visit (from the past 48 hour(s)). Na-138   K+ 3.6    Cl-107     Co2-27  BUN-8  Cr-0.95 Chol 210    Trig- 106    HDL- 59   LDL- 130   Post Admission Physician Evaluation: Functional deficits secondary  to left lacunar CR , BG, and IC infarcts, left subcortical anterior frontal infarct. Patient is admitted to receive collaborative, interdisciplinary care between the physiatrist, rehab nursing staff, and therapy team. Patient's level of medical complexity and substantial therapy needs in context of that medical necessity cannot be provided at a lesser intensity of care such as a SNF. Patient has experienced substantial functional loss from his/her baseline which was documented above under the "Functional History" and "Functional Status" headings.  Judging by the patient's diagnosis, physical exam, and functional history, the patient has potential for functional progress which will result in measurable gains while on inpatient rehab.  These gains will be of substantial and practical use upon discharge  in facilitating mobility and self-care at the household level. Physiatrist will provide 24 hour management of medical needs as well as oversight of the therapy plan/treatment and provide guidance as appropriate regarding the interaction of the two. 24 hour rehab nursing will assist with bladder management, bowel management, safety, skin/wound care, disease management, medication administration, pain management and patient education  and help integrate therapy concepts, techniques,education, etc. PT will assess and treat for/with: Lower extremity strength, range of motion, stamina, balance, functional mobility, safety, adaptive techniques and equipment, NMR, education.   Goals are: mod I to supervision. OT will assess and treat for/with: ADL's, functional mobility, safety, upper extremity strength, adaptive techniques and equipment, NMR, education.   Goals are: mod I to supervision. SLP will assess and treat for/with: language, dysphagia.  Goals are: mod I. Case  Management and Social Worker will assess and treat for psychological issues and discharge planning. Team conference will be held weekly to assess progress toward goals and to determine barriers to discharge. Patient will receive at least 3 hours of therapy per day at least 5 days per week. ELOS: 12-18 days        Prognosis:  excellent     Medical Problem List and Plan: 1. DVT Prophylaxis/Anticoagulation: Pharmaceutical: Lovenox 2. Pain Management: prn tylenol.   3. Bipolar disorder/Mood:  Continue tegretol, ambien,  as well as Ambien for chronic insomnia. Team to provide ego support. LSCW to follow for evaluation and support.   4. Neuropsych: This patient is capable of making decisions on his own behalf. 5. HTN: Reports that it's been hard to manage. Continue prinizide, metoprolol and imdur 6. COPD: will add albuterol MDI prn SOB. 7. Hyperglycemia: will check Hgb A1C--check CBG for trends.   8. Dyslipidemia: continue lipitor.  9. Drug/Tobacco abuse: education provided today regarding stroke risks, etc. Education ongoing     Meredith Staggers, MD, [facility]  09/28/2013

## 2013-09-29 ENCOUNTER — Inpatient Hospital Stay (HOSPITAL_COMMUNITY): Payer: Medicare Other

## 2013-09-29 ENCOUNTER — Inpatient Hospital Stay (HOSPITAL_COMMUNITY): Payer: Medicare Other | Admitting: *Deleted

## 2013-09-29 ENCOUNTER — Inpatient Hospital Stay (HOSPITAL_COMMUNITY): Payer: Medicare Other | Admitting: Physical Therapy

## 2013-09-29 DIAGNOSIS — I6350 Cerebral infarction due to unspecified occlusion or stenosis of unspecified cerebral artery: Secondary | ICD-10-CM

## 2013-09-29 DIAGNOSIS — I10 Essential (primary) hypertension: Secondary | ICD-10-CM

## 2013-09-29 DIAGNOSIS — F319 Bipolar disorder, unspecified: Secondary | ICD-10-CM

## 2013-09-29 LAB — GLUCOSE, CAPILLARY
Glucose-Capillary: 85 mg/dL (ref 70–99)
Glucose-Capillary: 111 mg/dL — ABNORMAL HIGH (ref 70–99)
Glucose-Capillary: 107 mg/dL — ABNORMAL HIGH (ref 70–99)
Glucose-Capillary: 86 mg/dL (ref 70–99)

## 2013-09-29 LAB — HEMOGLOBIN A1C
Hgb A1c MFr Bld: 5.4 % (ref <5.7)
Mean Plasma Glucose: 108 mg/dL (ref <117)

## 2013-09-29 MED ORDER — LISINOPRIL 20 MG PO TABS
20.0000 mg | ORAL_TABLET | ORAL | Status: AC
  Administered 2013-09-29: 20 mg via ORAL
  Filled 2013-09-29: qty 1

## 2013-09-29 MED ORDER — LISINOPRIL 40 MG PO TABS
40.0000 mg | ORAL_TABLET | Freq: Every day | ORAL | Status: DC
  Administered 2013-09-30 – 2013-10-03 (×4): 40 mg via ORAL
  Filled 2013-09-29 (×5): qty 1

## 2013-09-29 MED ORDER — HYDROCHLOROTHIAZIDE 25 MG PO TABS
25.0000 mg | ORAL_TABLET | Freq: Every day | ORAL | Status: DC
  Filled 2013-09-29: qty 1

## 2013-09-29 MED ORDER — HYDROCHLOROTHIAZIDE 25 MG PO TABS
25.0000 mg | ORAL_TABLET | Freq: Every day | ORAL | Status: DC
  Administered 2013-09-29 – 2013-10-03 (×5): 25 mg via ORAL
  Filled 2013-09-29 (×6): qty 1

## 2013-09-29 NOTE — Progress Notes (Signed)
OccupationalTherapy Note  Patient Details  Name: David Hall MRN: [id number] Date of Birth: Oct 05, 1955 Today's Date: 09/29/2013 Time:1630-1730  (60 min) Pain:  none Individual session Engaged in sitting balance static and dynamic, standing balance, transfers.  Pt. Lying in bed and came from supine to sit with verbal cues and minimal assist.  Sat EOB unsupported for 30 minutes while engaging in trunk exercises, dynamic sitting.  Addressed RUE NMRE with self ROM, weight bearing  Exercises.   Transferred bed to chair with minimal assist, stand pivot and cues for weight bearing and transitional movements.  Pt. Left in chair with safety belt on and dinner tray.       Lisa Roca 09/29/2013, 6:04 PM

## 2013-09-29 NOTE — Evaluation (Signed)
Physical Therapy Assessment and Plan  Patient Details  Name: David Hall MRN: [id number] Date of Birth: Jan 08, 1956  PT Diagnosis: Abnormality of gait, Hemiplegia dominant and Pain in Hall (headache due to HBP) Rehab Potential: Good ELOS: 14 to 18 days   Today's Date: 09/29/2013 Time: 0800-0900 Time Calculation (min): 60 min  Problem List:  Patient Active Problem List   Diagnosis Date Noted  . CVA (cerebral infarction) 09/28/2013    Past Medical History:  Past Medical History  Diagnosis Date  . Hypertension   . GERD (gastroesophageal reflux disease)   . Bipolar disorder   . COPD (chronic obstructive pulmonary disease)   . Shortness of breath   . Insomnia, controlled    Past Surgical History: No past surgical history on file.  Assessment & Plan Clinical Impression: [name] is a 58 year old male with history of HTN, bipolar disorder, Crack cocaine use who was admitted on 02/181/5 with right sided weakness and slurred speech. Patient with history of medical non-compliance and left AMA past diagnosis of PE but reported to have negative work up at [facility]. UDS positive for cocaine (reported to have used 2 days PTA) and benzos. MRI brain with acute lacunar infarct left corona radiata involving lentiform neucleus as well as IC and small subcortical infarct left anterior frontal lobe. Carotid dopplers with less than 50% B- ICA stenosis. 2D echo with EF of 50-55% with mild LVH, mild MVR. Patient placed on ASA for secondary stroke prevention and with resultant right sided weakness, right facial droop and mild dysarthria.   Patient transferred to CIR on 09/28/2013 .   Patient currently requires mod with mobility secondary to abnormal tone.  Prior to hospitalization, patient was independent  with mobility and lived with Daughter (64yo not working during the day) in a House home.  Home access is 1/2 step to enterStairs to enter.  Patient will benefit from skilled PT intervention to  maximize safe functional mobility, minimize fall risk and decrease caregiver burden for planned discharge home with 24 hour supervision.  Anticipate patient will benefit from follow up [place] at discharge.  PT - End of Session Activity Tolerance: Tolerates 30+ min activity with multiple rests Endurance Deficit: Yes PT Assessment Rehab Potential: Good Barriers to Discharge: Decreased caregiver support (37yo daughter to provide 24/7 support) PT Patient demonstrates impairments in the following area(s): Balance;Endurance;Motor;Safety PT Transfers Functional Problem(s): Bed Mobility;Bed to Chair;Car;Furniture;Floor PT Locomotion Functional Problem(s): Ambulation;Wheelchair Mobility PT Plan PT Intensity: Minimum of 1-2 x/day ,45 to 90 minutes PT Frequency: 5 out of 7 days PT Duration Estimated Length of Stay: 14 to 18 days PT Treatment/Interventions: Ambulation/gait training;Balance/vestibular training;DME/adaptive equipment instruction;Functional mobility training;Neuromuscular re-education;Patient/family education;Therapeutic Activities;Therapeutic Exercise;UE/LE Strength taining/ROM;UE/LE Coordination activities;Wheelchair propulsion/positioning PT Transfers Anticipated Outcome(s): S/Mod-I PT Locomotion Anticipated Outcome(s): S/Mod-I using LRAD (?hemiwalker) x 30'. Propel w/c Independently inside home. PT Recommendation Follow Up Recommendations: Home health PT Patient destination: Home Equipment Recommended: Wheelchair (measurements);Wheelchair cushion (measurements);Other (comment) (To be determined as patient progresses for LRAD)   PT Evaluation Precautions/Restrictions Precautions Precautions: Fall Precaution Comments: impulsive Restrictions Weight Bearing Restrictions: No General Chart Reviewed: Yes Family/Caregiver Present: No  Vital SignsTherapy Vitals BP: 181/89 mmHg Pain Pain Assessment Pain Assessment: 0-10 Pain Score: 8  Pain Type: Acute pain Pain Location: Hall Pain  Orientation: Upper Pain Descriptors / Indicators: Constant;Pressure;Throbbing Pain Onset: On-going Patients Stated Pain Goal: 3 Pain Intervention(s): Medication (See eMAR);RN made aware;Repositioned Multiple Pain Sites: No Home Living/Prior Functioning Home Living Available Help at Discharge: Family Type  of Home: House Home Access: Stairs to enter [employer] of Steps: 1/2 step to enter Entrance Stairs-Rails: None Home Layout: One level  Lives With: Daughter (30yo not working during the day) Prior Function Level of Independence: Independent with basic ADLs;Independent with gait;Independent with transfers;Requires assistive device for independence (occasionally used cane for ambulation)  Able to Take Stairs?: Yes Driving: Yes Vocation: On disability 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) Leisure: Hobbies-no Vision/Perception  Vision - History Baseline Vision: Wears glasses all the time Patient Visual Report: No change from baseline Vision - Assessment Eye Alignment: Within Functional Limits Perception Perception: Within Functional Limits Praxis Praxis: Intact  Cognition Overall Cognitive Status: Impaired/Different from baseline (impulsive) Arousal/Alertness: Awake/alert Orientation Level: Oriented X4 Attention: Alternating Alternating Attention: Appears intact Memory: Appears intact Awareness: Appears intact Problem Solving: Impaired Problem Solving Impairment: Functional basic Safety/Judgment: Appears intact Sensation Sensation Light Touch: Appears Intact Stereognosis: Impaired Detail Stereognosis Impaired Details: Absent RUE Hot/Cold: Appears Intact Proprioception: Appears Intact Coordination Gross Motor Movements are Fluid and Coordinated: No (R UE/LE hemiparesis) Fine Motor Movements are Fluid and Coordinated: No Coordination and Movement Description:  (R LE hemiparesis) Motor  Motor Motor: Hemiplegia (Right  hemiplegia)  Mobility Transfers Sit to Stand: 3: Mod assist Sit to Stand Details: Verbal cues for precautions/safety;Verbal cues for safe use of DME/AE;Tactile cues for placement Stand to Sit: 3: Mod assist Stand to Sit Details (indicate cue type and reason): Tactile cues for placement Locomotion  Ambulation Ambulation: No Gait Gait: No Stairs / Additional Locomotion Stairs: No Wheelchair Mobility Wheelchair Mobility: No  Trunk/Postural Assessment  Cervical Assessment Cervical Assessment: Within Functional Limits Thoracic Assessment Thoracic Assessment: Within Functional Limits Lumbar Assessment Lumbar Assessment: Within Functional Limits Postural Control Postural Control: Within Functional Limits  Balance Balance Balance Assessed: Yes Static Sitting Balance Static Sitting - Balance Support: Feet supported;Left upper extremity supported Static Sitting - Level of Assistance: 5: Stand by assistance Dynamic Sitting Balance Dynamic Sitting - Balance Support: Feet supported;Left upper extremity supported Dynamic Sitting - Level of Assistance: 4: Min assist Dynamic Sitting - Balance Activities: Lateral lean/weight shifting;Forward lean/weight shifting Static Standing Balance Static Standing - Balance Support: Left upper extremity supported Static Standing - Level of Assistance: 3: Mod assist Extremity Assessment  RLE Assessment RLE Assessment: Exceptions to WFL (ROM WFL, MMT grossly 1+ to 2-/5) LLE Assessment LLE Assessment: Within Functional Limits  FIM:  FIM - Bed/Chair Transfer Bed/Chair Transfer: 3: Supine > Sit: Mod A (lifting assist/Pt. 50-74%/lift 2 legs;3: Sit > Supine: Mod A (lifting assist/Pt. 50-74%/lift 2 legs);3: Bed > Chair or W/C: Mod A (lift or lower assist);3: Chair or W/C > Bed: Mod A (lift or lower assist) FIM - Locomotion: Wheelchair Locomotion: Wheelchair: 0: Activity did not occur FIM - Locomotion: Ambulation Locomotion: Ambulation: 0: Activity did  not occur FIM - Locomotion: Stairs Locomotion: Stairs: 0: Activity did not occur (no stairs at home (landing to enter))   Refer to Care Plan for Long Term Goals  Recommendations for other services: None  Discharge Criteria: Patient will be discharged from PT if patient refuses treatment 3 consecutive times without medical reason, if treatment goals not met, if there is a change in medical status, if patient makes no progress towards goals or if patient is discharged from hospital.  The above assessment, treatment plan, treatment alternatives and goals were discussed and mutually agreed upon: by patient  Clearence Ped 09/29/2013, 5:33 PM

## 2013-09-29 NOTE — Evaluation (Signed)
Occupational Therapy Assessment and Plan  Patient Details  Name: David Hall MRN: [id number] Date of Birth: Mar 23, 1956  OT Diagnosis: flaccid hemiplegia and hemiparesis Rehab Potential: Rehab Potential: Good ELOS: 14-18 days   Today's Date: 09/29/2013 Time: 0800-0900 Time Calculation (min): 60 min  Problem List:  Patient Active Problem List   Diagnosis Date Noted  . CVA (cerebral infarction) 09/28/2013    Past Medical History:  Past Medical History  Diagnosis Date  . Hypertension   . GERD (gastroesophageal reflux disease)   . Bipolar disorder   . COPD (chronic obstructive pulmonary disease)   . Shortness of breath   . Insomnia, controlled    Past Surgical History: No past surgical history on file.  Assessment & Plan Clinical Impression: Patient is a 58 y.o. year old male who presented to [facility] on 09-26-13 with right sided weakness and slurred speech. Pt's symptoms occurred for more than 12 hours prior to admit. Pt did not call EMS because he felt he could get over it. With no improvement, he called EMS eventually. Pt presented to ER with new weakness in right UE/LE, impaired slurred speech and unsteady gait. Initial head CT negative for acute findings. MRI demonstrated L frontal and corona radiata CVA. Note that pt had recent admit at [facility] on 09-20-13 for chest pain and shortness of breath and received treatment for PE. Pt left AMA during that admit. Pt does have history of using cocaine. Pt has been participating well with skilled PT, OT and SLP and further acute inpatient rehab was recommended. Pt is motivated to maximize his independence at inpatient rehab.  Patient transferred to CIR on 09/28/2013.  Patient currently requires max assist with basic self-care skills secondary to unbalanced muscle activation and decreased coordination.  Prior to hospitalization, patient could complete BADL independently.  Patient will benefit from skilled  intervention to increase independence with basic self-care skills prior to discharge home with care partner.  Anticipate patient will require minimal physical assistance and follow up home health.  OT - End of Session Activity Tolerance: Improving Endurance Deficit: Yes OT Assessment Rehab Potential: Good OT Patient demonstrates impairments in the following area(s): Balance;Endurance;Motor;Safety OT Basic ADL's Functional Problem(s): Eating;Grooming;Bathing;Dressing;Toileting OT Advanced ADL's Functional Problem(s): Simple Meal Preparation OT Transfers Functional Problem(s): Toilet;Tub/Shower OT Plan OT Intensity: Minimum of 1-2 x/day, 45 to 90 minutes OT Frequency: 5 out of 7 days OT Duration/Estimated Length of Stay: 14-18 days OT Treatment/Interventions: Balance/vestibular training;Self Care/advanced ADL retraining;Therapeutic Activities;Therapeutic Exercise;Wheelchair propulsion/positioning;Neuromuscular re-education;Discharge planning;Functional mobility training;Patient/family education;DME/adaptive equipment instruction;UE/LE Coordination activities OT Self Feeding Anticipated Outcome(s): Mod I OT Basic Self-Care Anticipated Outcome(s): Min A OT Toileting Anticipated Outcome(s): Mod I OT Bathroom Transfers Anticipated Outcome(s): Supervision OT Recommendation Patient destination: Home Equipment Recommended: To be determined   Skilled Therapeutic Intervention 1:1 OT initial evaluation completed with treatment provided to focus on functional transfers, management of flaccid R-UE, and adapted bathing and dressing skills.   Patient was aware of deficits, attentive to instruction and able to participate in bathing and dressing session at w/c level, sink side, sitting and standing supported, with mod assist to maintain standing balance, and hand-over-hand guidance with verbal cues for technique for right upper and lower extremities.  OT Evaluation Precautions/Restrictions   Precautions Precautions: Fall Precaution Comments: Aspiration precautions Restrictions Weight Bearing Restrictions: No  General Chart Reviewed: Yes Family/Caregiver Present: No  Vital Signs Therapy Vitals Temp: 97 F (36.1 C) Temp src: Oral Pulse Rate: 60 Resp: 17 BP: 191/86 mmHg Oxygen Therapy  SpO2: 95 % O2 Device: None (Room air)  Pain Pain Assessment Pain Assessment: No/denies pain  Home Living/Prior Functioning Home Living Available Help at Discharge: Family Type of Home: House Home Access: Stairs to enter [employer] of Steps: 1/2 step up Entrance Stairs-Rails: None Home Layout: One level  Lives With: Daughter IADL History Homemaking Responsibilities: Yes Meal Prep Responsibility: Primary Laundry Responsibility: Secondary Cleaning Responsibility: Secondary Bill Paying/Finance Responsibility: Primary Shopping Responsibility: Secondary Child Care Responsibility: No Current License: Yes Mode of Transportation: Car Education: 10th grade Occupation: On disability Type of Occupation: Cabin crew, Probation officer Leisure and Hobbies: watch TV, history channel Prior Function Level of Independence: Independent with basic ADLs;Requires assistive device for independence (intermittent use of cane at home)  Able to Take Stairs?: Yes Driving: Yes Vocation: On disability Leisure: Hobbies-no  ADL ADL ADL Comments: see FIM  Vision/Perception  Vision - History Baseline Vision: Wears glasses all the time Patient Visual Report: No change from baseline Vision - Assessment Eye Alignment: Within Functional Limits Perception Perception: Within Functional Limits Praxis Praxis: Intact   Cognition Overall Cognitive Status: Within Functional Limits for tasks assessed Arousal/Alertness: Awake/alert Orientation Level: Oriented X4 Attention: Alternating Alternating Attention: Appears intact Memory: Appears intact Awareness: Impaired Awareness  Impairment: Intellectual Behavior: Impaired Behavioral Impairment: Impulsive Problem Solving: Impaired Problem Solving Impairment: Functional basic Safety/Judgment: Impaired  Sensation Sensation Light Touch: Appears Intact Stereognosis: Impaired Detail Stereognosis Impaired Details: Absent RUE Hot/Cold: Appears Intact Proprioception: Appears Intact Coordination Gross Motor Movements are Fluid and Coordinated: No Fine Motor Movements are Fluid and Coordinated: No Coordination and Movement Description: flaccid R-UE  Motor  Motor Motor: Hemiplegia (Right hemiplegia)  Mobility  Transfers Transfers: Sit to Stand;Stand to Sit Sit to Stand: 3: Mod assist Sit to Stand Details: Verbal cues for precautions/safety;Verbal cues for safe use of DME/AE;Tactile cues for placement Stand to Sit: 3: Mod assist Stand to Sit Details (indicate cue type and reason): Tactile cues for placement   Trunk/Postural Assessment  Cervical Assessment Cervical Assessment: Within Functional Limits Thoracic Assessment Thoracic Assessment: Within Functional Limits Lumbar Assessment Lumbar Assessment: Within Functional Limits Postural Control Postural Control: Within Functional Limits   Balance    Extremity/Trunk Assessment RUE Assessment RUE Assessment: Exceptions to [facility] RUE Strength RUE Overall Strength: Deficits RUE Tone RUE Tone: Flaccid LUE Assessment LUE Assessment: Within Functional Limits  FIM:  FIM - Eating Eating Activity: 5: Set-up assist for open containers FIM - Grooming Grooming Steps: Wash, rinse, dry face;Wash, rinse, dry hands Grooming: 3: Patient completes 2 of 4 or 3 of 5 steps FIM - Bathing Bathing Steps Patient Completed: Chest;Right Arm;Abdomen;Front perineal area Bathing: 2: Max-Patient completes 3-4 [id number] parts or 25-49% FIM - Upper Body Dressing/Undressing Upper body dressing/undressing steps patient completed: Thread/unthread right sleeve of pullover  shirt/dresss;Thread/unthread left sleeve of pullover shirt/dress;Put head through opening of pull over shirt/dress Upper body dressing/undressing: 4: Min-Patient completed 75 plus % of tasks FIM - Lower Body Dressing/Undressing Lower body dressing/undressing steps patient completed: Thread/unthread left pants leg Lower body dressing/undressing: 1: Total-Patient completed less than 25% of tasks FIM - TRadio producerDevices: Grab bars Toilet Transfers: 3-To toilet/BSC: Mod A (lift or lower assist);3-From toilet/BSC: Mod A (lift or lower assist) FIM - Tub/Shower Transfers Tub/shower Transfers: 0-Activity did not occur or was simulated   Refer to Care Plan for Long Term Goals  Recommendations for other services: None  Discharge Criteria: Patient will be discharged from OT if patient refuses treatment 3 consecutive times without medical  reason, if treatment goals not met, if there is a change in medical status, if patient makes no progress towards goals or if patient is discharged from hospital.  The above assessment, treatment plan, treatment alternatives and goals were discussed and mutually agreed upon: by patient  [facility] 09/29/2013, 4:47 PM

## 2013-09-29 NOTE — Progress Notes (Signed)
Physical Therapy Session Note  Patient Details  Name: David Hall MRN: [id number] Date of Birth: November 27, 1955  Today's Date: 09/29/2013 Time: 1600-1630 Time Calculation (min): 30 min   Therapy Documentation Precautions:  Precautions Precautions: Fall Precaution Comments: impulsive Restrictions Weight Bearing Restrictions: No Pain: denies pain  Therapeutic Activity:(15') bed mobility min/Mod-assist, transfers bed<->w/c and w/c<->chair with Mod-assist Therapeutic Exercise:(15') Nu-Step x 10' Level 4 with L hand grip attachment and rest break x1.   Therapy/Group: Individual Therapy  Clearence Ped 09/29/2013, 5:51 PM

## 2013-09-29 NOTE — Progress Notes (Addendum)
         Subjective/Complaints: No complaints. bp high. Able to sleep. A 12 point review of systems has been performed and if not noted above is otherwise negative.   Objective: Vital Signs: Blood pressure 191/86, pulse 60, temperature 97 F (36.1 C), temperature source Oral, resp. rate 17, SpO2 95.00%. No results found. No results found for this basename: WBC, HGB, HCT, PLT,  in the last 72 hours No results found for this basename: NA, K, CL, CO, GLUCOSE, BUN, CREATININE, CALCIUM,  in the last 72 hours CBG (last 3)   Recent Labs  09/28/13 1836 09/28/13 2115 09/29/13 0718  GLUCAP 87 79 85    Wt Readings from Last 3 Encounters:  09/28/13 89.812 kg (198 lb)    Physical Exam:  Constitutional: . He appears well-developed and well-nourished.  HENT:  Head: Normocephalic and atraumatic. Ear ring left ear  Eyes: Conjunctivae are normal. Pupils are equal, round, and reactive to light.  Neck: Normal range of motion. Neck supple.  Cardiovascular: Normal rate and regular rhythm. No murmurs  Respiratory: Effort normal and breath sounds normal. No respiratory distress. He has no wheezes. No rales  GI: Soft. Bowel sounds are normal. He exhibits no distension.  no tenderness.  Musculoskeletal: He exhibits trace edema RUE.[name]  Neurological: exam unchanged: He is alert and oriented to person, place, and time.  Mild dysarthria with occasional word finding difficulties. Able to ID 3/3 simple objects, but sometimes stutters. Mild facial weakness with minimal tongue deviation. Follows basic commands without difficulty. Right hemiparesis: RUE 0/5 deltoid, bicep, tricep, HI. RLE 2- HF, 1+ KE, 1+ ADF and APF. Sensation appears grossly intact RUE and RLE. DTR's 2+ Right arm and leg. 1+ Left side. Reasonable insight and awareness.  Skin: Skin is warm and dry.  Psych: just waking up   Assessment/Plan: 1. Functional deficits secondary to left lacunar CR, BG, and IC infarcts, left subcortical frontal  infarct which require 3+ hours per day of interdisciplinary therapy in a comprehensive inpatient rehab setting. Physiatrist is providing close team supervision and 24 hour management of active medical problems listed below. Physiatrist and rehab team continue to assess barriers to discharge/monitor patient progress toward functional and medical goals. FIM:                                  Medical Problem List and Plan:  1. DVT Prophylaxis/Anticoagulation: Pharmaceutical: Lovenox  2. Pain Management: prn tylenol.  3. Bipolar disorder/Mood: Continue tegretol, ambien, as well as Ambien for chronic insomnia. Team to provide ego support. LSCW to follow for evaluation and support.  4. Neuropsych: This patient is capable of making decisions on his own behalf.  5. HTN: continued poor control. increase prinizide to 40, continue metoprolol and imdur. Increase HCTZ to 25mg   6. COPD: will add albuterol MDI prn SOB.  7. Hyperglycemia: will check Hgb A1C--check CBG for trends.  8. Dyslipidemia: continue lipitor.  9. Drug/Tobacco abuse: education provided today regarding stroke risks, etc. Education ongoing  LOS (Days) 1 A FACE TO FACE EVALUATION WAS PERFORMED  Carren Blakley T 09/29/2013 8:32 AM

## 2013-09-30 DIAGNOSIS — I6350 Cerebral infarction due to unspecified occlusion or stenosis of unspecified cerebral artery: Secondary | ICD-10-CM | POA: Diagnosis not present

## 2013-09-30 DIAGNOSIS — F319 Bipolar disorder, unspecified: Secondary | ICD-10-CM | POA: Diagnosis not present

## 2013-09-30 DIAGNOSIS — I10 Essential (primary) hypertension: Secondary | ICD-10-CM | POA: Diagnosis not present

## 2013-09-30 LAB — GLUCOSE, CAPILLARY
Glucose-Capillary: 84 mg/dL (ref 70–99)
Glucose-Capillary: 113 mg/dL — ABNORMAL HIGH (ref 70–99)
Glucose-Capillary: 124 mg/dL — ABNORMAL HIGH (ref 70–99)
Glucose-Capillary: 113 mg/dL — ABNORMAL HIGH (ref 70–99)

## 2013-09-30 NOTE — IPOC Note (Addendum)
Overall Plan of Care [facility]) Patient Details Name: David Hall MRN: [id number] DOB: 12-Mar-1956  Admitting Diagnosis: Frontal CVA  Hospital Problems: Active Problems:   CVA (cerebral infarction)     Functional Problem List: Nursing Endurance;Medication Management;Motor;Pain;Safety;Skin Integrity  PT Balance;Endurance;Motor;Safety  OT Balance;Endurance;Motor;Safety  SLP    TR  activity tolerance, balance, safety       Basic ADL's: OT Eating;Grooming;Bathing;Dressing;Toileting     Advanced  ADL's: OT Simple Meal Preparation     Transfers: PT Bed Mobility;Bed to Chair;Car;Furniture;Floor  OT Toilet;Tub/Shower     Locomotion: PT Ambulation;Wheelchair Mobility     Additional Impairments: OT    SLP        TR      Anticipated Outcomes Item Anticipated Outcome  Self Feeding Mod I  Swallowing      Basic self-care  Min A  Toileting  Mod I   Bathroom Transfers Supervision  Bowel/Bladder  patient will be continent of bowel and bladder with min assist  Transfers  S/Mod-I  Locomotion  S/Mod-I using LRAD (?hemiwalker) x 30'. Propel w/c Independently inside home.  Communication     Cognition     Pain  pain will be at a level of 3 or less on a scale of 0-10  Safety/Judgment  Pt will remain free from falls/injury with min assist   Therapy Plan: PT Intensity: Minimum of 1-2 x/day ,45 to 90 minutes PT Frequency: 5 out of 7 days PT Duration Estimated Length of Stay: 14 to 18 days OT Intensity: Minimum of 1-2 x/day, 45 to 90 minutes OT Frequency: 5 out of 7 days OT Duration/Estimated Length of Stay: 14-18 days         Team Interventions: Nursing Interventions Patient/Family Education;Disease Management/Prevention;Pain Management;Medication Management;Skin Care/Wound Management;Bladder Management;Bowel Management  PT interventions Ambulation/gait training;Balance/vestibular training;DME/adaptive equipment instruction;Functional mobility training;Neuromuscular  re-education;Patient/family education;Therapeutic Activities;Therapeutic Exercise;UE/LE Strength taining/ROM;UE/LE Coordination activities;Wheelchair propulsion/positioning  OT Interventions Balance/vestibular training;Self Care/advanced ADL retraining;Therapeutic Activities;Therapeutic Exercise;Wheelchair propulsion/positioning;Neuromuscular re-education;Discharge planning;Functional mobility training;Patient/family education;DME/adaptive equipment instruction;UE/LE Coordination activities  SLP Interventions    TR Interventions  rec/leisure participation, therapeutic activities, UE/LE strength/coordination, community reintegration, pt/family education, discharge planning, leisure education, adaptive equipment use, psychosocial support  SW/CM Interventions      Team Discharge Planning: Destination: PT-Home ,OT- Home , SLP-  Projected Follow-up: PT-Home health PT, OT-   , SLP-  Projected Equipment Needs: PT-Wheelchair (measurements);Wheelchair cushion (measurements);Other (comment) (To be determined as patient progresses for LRAD), OT- To be determined, SLP-  Equipment Details: PT- , OT-  Patient/family involved in discharge planning: PT- Patient,  OT-Patient, SLP-   MD ELOS: 14-18 days Medical Rehab Prognosis:  Excellent Assessment: The patient has been admitted for CIR therapies. The team will be addressing, functional mobility, strength, stamina, balance, safety, adaptive techniques/equipment, self-care, bowel and bladder mgt, patient and caregiver education, NMR, secondary prevention education. Goals have been set at supervision to mod I with mobility and min assist to mod I with basic self-care.    Meredith Staggers, MD, FAAPMR      See Team Conference Notes for weekly updates to the plan of care

## 2013-09-30 NOTE — Progress Notes (Signed)
Subjective/Complaints: Had a good day yesterday. Things went well. He reports no problems. Eating breakfast this am. A 12 point review of systems has been performed and if not noted above is otherwise negative.   Objective: Vital Signs: Blood pressure 174/79, pulse 63, temperature 98 F (36.7 C), temperature source Oral, resp. rate 19, SpO2 97.00%. No results found. No results found for this basename: WBC, HGB, HCT, PLT,  in the last 72 hours No results found for this basename: NA, K, CL, CO, GLUCOSE, BUN, CREATININE, CALCIUM,  in the last 72 hours CBG (last 3)   Recent Labs  09/29/13 1707 09/29/13 2044 09/30/13 0722  GLUCAP 107* 86 84    Wt Readings from Last 3 Encounters:  09/28/13 89.812 kg (198 lb)    Physical Exam:  Constitutional: . He appears well-developed and well-nourished.  HENT:  Head: Normocephalic and atraumatic. Ear ring left ear  Eyes: Conjunctivae are normal. Pupils are equal, round, and reactive to light.  Neck: Normal range of motion. Neck supple.  Cardiovascular: Normal rate and regular rhythm. No murmurs  Respiratory: Effort normal and breath sounds normal. No respiratory distress. He has no wheezes. No rales  GI: Soft. Bowel sounds are normal. He exhibits no distension.  no tenderness.  Musculoskeletal: He exhibits trace edema RUE.[name]  Neurological: exam unchanged: He is alert and oriented to person, place, and time.  Mild dysarthria with occasional word finding difficulties. Able to ID 3/3 simple objects, but sometimes stutters. Mild facial weakness with minimal tongue deviation. Follows basic commands without difficulty. Right hemiparesis: RUE 0/5 deltoid, bicep, tricep, HI. RLE 2- HF, 1+ KE, 1+ ADF and APF. Sensation appears grossly intact RUE and RLE. DTR's 2+ Right arm and leg. 1+ Left side. Reasonable insight and awareness.  Skin: Skin is warm and dry.  Psych: just waking up   Assessment/Plan: 1. Functional deficits secondary to left  lacunar CR, BG, and IC infarcts, left subcortical frontal infarct which require 3+ hours per day of interdisciplinary therapy in a comprehensive inpatient rehab setting. Physiatrist is providing close team supervision and 24 hour management of active medical problems listed below. Physiatrist and rehab team continue to assess barriers to discharge/monitor patient progress toward functional and medical goals. FIM: FIM - Bathing Bathing Steps Patient Completed: Chest;Right Arm;Abdomen;Front perineal area Bathing: 2: Max-Patient completes 3-4 54f 10 parts or 25-49%  FIM - Upper Body Dressing/Undressing Upper body dressing/undressing steps patient completed: Thread/unthread right sleeve of pullover shirt/dresss;Thread/unthread left sleeve of pullover shirt/dress;Put head through opening of pull over shirt/dress Upper body dressing/undressing: 4: Min-Patient completed 75 plus % of tasks FIM - Lower Body Dressing/Undressing Lower body dressing/undressing steps patient completed: Thread/unthread left pants leg Lower body dressing/undressing: 1: Total-Patient completed less than 25% of tasks     FIM - Radio producer Devices: Grab bars Toilet Transfers: 3-To toilet/BSC: Mod A (lift or lower assist);3-From toilet/BSC: Mod A (lift or lower assist)  FIM - Bed/Chair Transfer Bed/Chair Transfer: 3: Supine > Sit: Mod A (lifting assist/Pt. 50-74%/lift 2 legs;3: Sit > Supine: Mod A (lifting assist/Pt. 50-74%/lift 2 legs);3: Bed > Chair or W/C: Mod A (lift or lower assist);3: Chair or W/C > Bed: Mod A (lift or lower assist)  FIM - Locomotion: Wheelchair Locomotion: Wheelchair: 0: Activity did not occur FIM - Locomotion: Ambulation Locomotion: Ambulation: 0: Activity did not occur  Comprehension Comprehension Mode: Auditory Comprehension: 5-Follows basic conversation/direction: With extra time/assistive device  Expression Expression Mode: Verbal  Expression: 5-Expresses  basic 90% of the time/requires cueing < 10% of the time.  Social Interaction Social Interaction: 5-Interacts appropriately 90% of the time - Needs monitoring or encouragement for participation or interaction.  Problem Solving Problem Solving: 4-Solves basic 75 - 89% of the time/requires cueing 10 - 24% of the time  Memory Memory: 7-Complete Independence: No helper  Medical Problem List and Plan:  1. DVT Prophylaxis/Anticoagulation: Pharmaceutical: Lovenox  2. Pain Management: prn tylenol.  3. Bipolar disorder/Mood: Continue tegretol, ambien, as well as Ambien for chronic insomnia. Team to provide ego support. LSCW to follow for evaluation and support.  4. Neuropsych: This patient is capable of making decisions on his own behalf.  5. HTN: continued poor control. increase prinizide to 40, continue metoprolol and imdur. Increased HCTZ to 25mg  yesterday 6. COPD: will add albuterol MDI prn SOB.  7. Hyperglycemia:  check Hgb A1C--check CBG for trends.  8. Dyslipidemia: continue lipitor.  9. Drug/Tobacco abuse: education provided today regarding stroke risks, etc. Education ongoing  LOS (Days) 2 A FACE TO FACE EVALUATION WAS PERFORMED  David Hall T 09/30/2013 10:20 AM

## 2013-10-01 ENCOUNTER — Inpatient Hospital Stay (HOSPITAL_COMMUNITY): Payer: Medicare Other | Admitting: Physical Therapy

## 2013-10-01 ENCOUNTER — Inpatient Hospital Stay (HOSPITAL_COMMUNITY): Payer: Medicare Other

## 2013-10-01 ENCOUNTER — Encounter (HOSPITAL_COMMUNITY): Payer: Medicare Other | Admitting: Occupational Therapy

## 2013-10-01 DIAGNOSIS — Z8673 Personal history of transient ischemic attack (TIA), and cerebral infarction without residual deficits: Secondary | ICD-10-CM | POA: Diagnosis not present

## 2013-10-01 DIAGNOSIS — I6350 Cerebral infarction due to unspecified occlusion or stenosis of unspecified cerebral artery: Secondary | ICD-10-CM | POA: Diagnosis not present

## 2013-10-01 DIAGNOSIS — I10 Essential (primary) hypertension: Secondary | ICD-10-CM

## 2013-10-01 DIAGNOSIS — F319 Bipolar disorder, unspecified: Secondary | ICD-10-CM | POA: Diagnosis not present

## 2013-10-01 LAB — COMPREHENSIVE METABOLIC PANEL
ALT: 17 U/L (ref 0–53)
AST: 27 U/L (ref 0–37)
Albumin: 3.5 g/dL (ref 3.5–5.2)
Alkaline Phosphatase: 84 U/L (ref 39–117)
BUN: 17 mg/dL (ref 6–23)
CO2: 23 mEq/L (ref 19–32)
Calcium: 8.7 mg/dL (ref 8.4–10.5)
Chloride: 96 mEq/L (ref 96–112)
Creatinine, Ser: 0.96 mg/dL (ref 0.50–1.35)
GFR calc Af Amer: >90 mL/min (ref >90)
GFR calc non Af Amer: >90 mL/min (ref >90)
Glucose, Bld: 103 mg/dL — ABNORMAL HIGH (ref 70–99)
Potassium: 3.7 mEq/L (ref 3.7–5.3)
Sodium: 132 mEq/L — ABNORMAL LOW (ref 137–147)
Total Bilirubin: 0.2 mg/dL — ABNORMAL LOW (ref 0.3–1.2)
Total Protein: 6.8 g/dL (ref 6.0–8.3)

## 2013-10-01 LAB — CBC WITH DIFFERENTIAL/PLATELET
Basophils Absolute: 0 10*3/uL (ref 0.0–0.1)
Basophils Relative: 0 % (ref 0–1)
Eosinophils Absolute: 0.4 10*3/uL (ref 0.0–0.7)
Eosinophils Relative: 5 % (ref 0–5)
HCT: 36.9 % — ABNORMAL LOW (ref 39.0–52.0)
Hemoglobin: 12.4 g/dL — ABNORMAL LOW (ref 13.0–17.0)
Lymphocytes Relative: 26 % (ref 12–46)
Lymphs Abs: 2.1 10*3/uL (ref 0.7–4.0)
MCH: 29.5 pg (ref 26.0–34.0)
MCHC: 33.6 g/dL (ref 30.0–36.0)
MCV: 87.6 fL (ref 78.0–100.0)
Monocytes Absolute: 0.9 10*3/uL (ref 0.1–1.0)
Monocytes Relative: 11 % (ref 3–12)
Neutro Abs: 4.6 10*3/uL (ref 1.7–7.7)
Neutrophils Relative %: 58 % (ref 43–77)
Platelets: 210 10*3/uL (ref 150–400)
RBC: 4.21 MIL/uL — ABNORMAL LOW (ref 4.22–5.81)
RDW: 14.5 % (ref 11.5–15.5)
WBC: 7.9 10*3/uL (ref 4.0–10.5)

## 2013-10-01 LAB — GLUCOSE, CAPILLARY
Glucose-Capillary: 79 mg/dL (ref 70–99)
Glucose-Capillary: 132 mg/dL — ABNORMAL HIGH (ref 70–99)

## 2013-10-01 NOTE — Care Management Note (Signed)
Inpatient [facility] Individual Statement of Services  Patient Name:  [name]  Date:  10/01/2013  Welcome to the [facility].  Our goal is to provide you with an individualized program based on your diagnosis and situation, designed to meet your specific needs.  With this comprehensive rehabilitation program, you will be expected to participate in at least 3 hours of rehabilitation therapies Monday-Friday, with modified therapy programming on the weekends.  Your rehabilitation program will include the following services:  Physical Therapy (PT), Occupational Therapy (OT), Speech Therapy (ST), 24 hour per day rehabilitation nursing, Case Management (Social Worker), Rehabilitation Medicine, Nutrition Services and Pharmacy Services  Weekly team conferences will be held on Wednesday to discuss your progress.  Your Social Worker will talk with you frequently to get your input and to update you on team discussions.  Team conferences with you and your family in attendance may also be held.  Expected length of stay: 14-18 days Overall anticipated outcome: supervision/mod/i level  Depending on your progress and recovery, your program may change. Your Social Worker will coordinate services and will keep you informed of any changes. Your Social Worker's name and contact numbers are listed  below.  The following services may also be recommended but are not provided by the [facility] will be made to provide these services after discharge if needed.  Arrangements include referral to agencies that provide these services.  Your insurance has been verified to be:  Medicare & Medicaid Your primary doctor is:  Dr Park Liter  Pertinent information will be shared with your doctor and your insurance company.  Social Worker:  Ovidio Kin, [place] or [phone]  Information discussed with and copy given to patient by: [name], 10/01/2013, 9:48 AM

## 2013-10-01 NOTE — Progress Notes (Signed)
Patient information reviewed and entered into eRehab system by Stassi Fadely, RN, CRRN, PPS Coordinator.  Information including medical coding and functional independence measure will be reviewed and updated through discharge.     Per nursing patient was given "Data Collection Information Summary for Patients in Inpatient Rehabilitation Facilities with attached "Privacy Act Statement-Health Care Records" upon admission.  

## 2013-10-01 NOTE — Progress Notes (Signed)
Occupational Therapy Session Note  Patient Details  Name: David Hall MRN: [id number] Date of Birth: 1955/10/14  Today's Date: 10/01/2013 Time: 4627-0350 Time Calculation (min): 28 min  Short Term Goals: Week 1:  OT Short Term Goal 1 (Week 1): Patient will complete upper body bathing and dressing with setup assist OT Short Term Goal 2 (Week 1): Patient will complete lower body bathing and dressing with mod assist OT Short Term Goal 3 (Week 1): Patient will complete transfer, w/c <> toilet, with min assist OT Short Term Goal 4 (Week 1): Patient will complete seated grooming and hygiene with min assist  Skilled Therapeutic Interventions/Progress Updates:    Pt seen for 1:1 OT session with focus on NMR to RUE. Engaged in ROM exercises and PNF patterns while supine on mat table to decrease compensatory movements at shoulder and facilitate motor learning. Participated in reaching activity while weight bearing through RUE in side lying and focus on strengthening core and using RUE to assist with pushing up to sitting upright during rest breaks. Provided muscle tapping and AAROM to RUE while sitting upright. Pt demonstrating good tricep activation and some bicep activation following muscle tapping. Pt with min shoulder flexion noted. At end of session pt returned to room and left with all needs in reach.   Therapy Documentation Precautions:  Precautions Precautions: Fall Precaution Comments: impulsive Restrictions Weight Bearing Restrictions: No General:   Vital Signs:   Pain: No report of pain during therapy session.   See FIM for current functional status  Therapy/Group: Individual Therapy  Duayne Cal 10/01/2013, 2:35 PM

## 2013-10-01 NOTE — Progress Notes (Signed)
Subjective/Complaints: Practicing movements, able to push R arm forward, denies numbness on Right side. A 12 point review of systems has been performed and if not noted above is otherwise negative.   Objective: Vital Signs: Blood pressure 152/74, pulse 54, temperature 97.6 F (36.4 C), temperature source Oral, resp. rate 18, SpO2 96.00%. No results found. No results found for this basename: WBC, HGB, HCT, PLT,  in the last 72 hours No results found for this basename: NA, K, CL, CO, GLUCOSE, BUN, CREATININE, CALCIUM,  in the last 72 hours CBG (last 3)   Recent Labs  09/30/13 1112 09/30/13 1636 09/30/13 2051  GLUCAP 113* 124* 113*    Wt Readings from Last 3 Encounters:  09/28/13 89.812 kg (198 lb)    Physical Exam:  Constitutional: . He appears well-developed and well-nourished.  HENT:  Head: Normocephalic and atraumatic. Ear ring left ear  Eyes: Conjunctivae are normal. Pupils are equal, round, and reactive to light.  Neck: Normal range of motion. Neck supple.  Cardiovascular: Normal rate and regular rhythm. No murmurs  Respiratory: Effort normal and breath sounds normal. No respiratory distress. He has no wheezes. No rales  GI: Soft. Bowel sounds are normal. He exhibits no distension.  no tenderness.  Musculoskeletal: He exhibits trace edema RUE.[name]  Neurological: exam unchanged: He is alert and oriented to person, place, and time.  Mild dysarthria with occasional word finding difficulties. . Mild facial weakness with minimal tongue deviation. Follows basic commands without difficulty. Right hemiparesis: RUE 0/5 deltoid, bicep,trace tricep,0 HI. RLE 2- HF, 1+ KE, 1+ ADF and APF. Sensation appears grossly intact RUE and RLE. DTR's 2+ Right arm and leg. 1+ Left side. Reasonable insight and awareness.  Skin: Skin is warm and dry.  Psych: no lability   Assessment/Plan: 1. Functional deficits secondary to left lacunar CR, BG, and IC infarcts, left subcortical frontal  infarct which require 3+ hours per day of interdisciplinary therapy in a comprehensive inpatient rehab setting. Physiatrist is providing close team supervision and 24 hour management of active medical problems listed below. Physiatrist and rehab team continue to assess barriers to discharge/monitor patient progress toward functional and medical goals. FIM: FIM - Bathing Bathing Steps Patient Completed: Chest;Right Arm;Abdomen;Front perineal area Bathing: 2: Max-Patient completes 3-4 21f 10 parts or 25-49%  FIM - Upper Body Dressing/Undressing Upper body dressing/undressing steps patient completed: Thread/unthread right sleeve of pullover shirt/dresss;Thread/unthread left sleeve of pullover shirt/dress;Put head through opening of pull over shirt/dress Upper body dressing/undressing: 4: Min-Patient completed 75 plus % of tasks FIM - Lower Body Dressing/Undressing Lower body dressing/undressing steps patient completed: Thread/unthread left pants leg Lower body dressing/undressing: 1: Total-Patient completed less than 25% of tasks     FIM - Radio producer Devices: Grab bars Toilet Transfers: 3-To toilet/BSC: Mod A (lift or lower assist);3-From toilet/BSC: Mod A (lift or lower assist)  FIM - Bed/Chair Transfer Bed/Chair Transfer: 3: Supine > Sit: Mod A (lifting assist/Pt. 50-74%/lift 2 legs;3: Sit > Supine: Mod A (lifting assist/Pt. 50-74%/lift 2 legs);3: Bed > Chair or W/C: Mod A (lift or lower assist);3: Chair or W/C > Bed: Mod A (lift or lower assist)  FIM - Locomotion: Wheelchair Locomotion: Wheelchair: 0: Activity did not occur FIM - Locomotion: Ambulation Locomotion: Ambulation: 0: Activity did not occur  Comprehension Comprehension Mode: Auditory Comprehension: 5-Follows basic conversation/direction: With extra time/assistive device  Expression Expression Mode: Verbal Expression: 5-Expresses basic 90% of the time/requires cueing < 10% of the  time.  Social Interaction Social Interaction: 5-Interacts appropriately 90% of the time - Needs monitoring or encouragement for participation or interaction.  Problem Solving Problem Solving: 4-Solves basic 75 - 89% of the time/requires cueing 10 - 24% of the time  Memory Memory: 7-Complete Independence: No helper  Medical Problem List and Plan:  1. DVT Prophylaxis/Anticoagulation: Pharmaceutical: Lovenox  2. Pain Management: prn tylenol.  3. Bipolar disorder/Mood: Continue tegretol, ambien, as well as Ambien for chronic insomnia. Team to provide ego support. LSCW to follow for evaluation and support.  4. Neuropsych: This patient is capable of making decisions on his own behalf.  5. HTN: continued poor control. increase prinizide to 40, continue metoprolol and imdur. Increased HCTZ to 25mg  yesterday 6. COPD: will add albuterol MDI prn SOB.  7. Hyperglycemia:  check Hgb A1C--check CBG for trends.  8. Dyslipidemia: continue lipitor.  9. Drug/Tobacco abuse: education provided today regarding stroke risks, etc. Education ongoing  LOS (Days) 3 A FACE TO Isabel E 10/01/2013 6:24 AM

## 2013-10-01 NOTE — Evaluation (Signed)
Speech Language Pathology Assessment and Plan  Patient Details  Name: David Hall MRN: [id number] Date of Birth: 08-13-1955  SLP Diagnosis: Dysarthria;Dysphagia  Rehab Potential: Excellent ELOS: 14-18 days   Today's Date: 10/01/2013 Time: 1300-1400 Time Calculation (min): 60 min  Problem List:  Patient Active Problem List   Diagnosis Date Noted  . CVA (cerebral infarction) 09/28/2013   Past Medical History:  Past Medical History  Diagnosis Date  . Hypertension   . GERD (gastroesophageal reflux disease)   . Bipolar disorder   . COPD (chronic obstructive pulmonary disease)   . Shortness of breath   . Insomnia, controlled    Past Surgical History: No past surgical history on file.  Assessment / Plan / Recommendation Clinical Impression Pt is a 58 year old male with h/o HTN, bipolar disorder, Crack cocaine use who was admitted on 02/18 with right sided weakness and slurred speech. Patient with h/o medical non-compliance and left AMA past diagnosis of PE but reported to have negative work up at [facility]. UDS positive for cocaine (reported to have used 2 days PTA) and benzos. MRI brain with acute lacunar infarct left corona radiata involving lentiform neucleus as well as IC and small subcortical infarct left anterior frontal lobe. Carotid dopplers with less than 50% B- ICA stenosis. 2D echo with EF of 50-55% with mild LVH, mild MVR. Patient placed on ASA for secondary stroke prevention and with resultant right sided weakness, right facial droop and mild dysarthria. He was evaluated by rehab team and CIR recommended for follow up therapies. SLP bedside-swallow evaluation revealed oropharyngeal swallow that appears [facility]. Pt was advanced to regular textures and pills whole with thin liquid. Will follow briefly for tolerance. SLP cognitive-linguistic evaluation also reveals cognitive-linguistic functional that appears to be at baseline level of functioning. Pt does exhibit a mild-moderate  dysarthria characterized primarily by imprecise articulation. Pt will benefit from skilled SLP services to maximize swallowing safety and functional communication prior to discharge home with family.   Skilled Therapeutic Interventions          SLP bedside-swallow and cognitive-linguistic evaluations were administered with results and recommendations reviewed with patient. Education was also offered regarding general reflux precautions and safe swallowing strategies.   SLP Assessment  Patient will need skilled Speech Lanaguage Pathology Services during CIR admission    Recommendations  Diet Recommendations: Regular;Thin liquid Liquid Administration via: Cup;Straw Medication Administration: Whole meds with liquid Supervision: Patient able to self feed;Intermittent supervision to cue for compensatory strategies Compensations: Slow rate;Small sips/bites Postural Changes and/or Swallow Maneuvers: Seated upright 90 degrees;Upright 30-60 min after meal Oral Care Recommendations: Oral care BID Patient destination: Home Follow up Recommendations: Home Health SLP;Outpatient SLP Equipment Recommended: None recommended by SLP    SLP Frequency 5 out of 7 days   SLP Treatment/Interventions Cueing hierarchy;Dysphagia/aspiration precaution training;Functional tasks;Patient/family education;Speech/Language facilitation;Therapeutic Activities    Pain Pain Assessment Pain Assessment: No/denies pain Prior Functioning Cognitive/Linguistic Baseline: Within functional limits Type of Home: House  Lives With: Daughter Available Help at Discharge: Family Education: 10th grade Vocation: On disability  Short Term Goals: Week 1: SLP Short Term Goal 1 (Week 1): Pt will uutilize safe swallowing strategies with current diet with Mod I SLP Short Term Goal 2 (Week 1): Pt will utilize speech intelligibility strategies at the conversational level with supervision level verbal cues.  See FIM for current functional  status Refer to Care Plan for Long Term Goals  Recommendations for other services: None  Discharge Criteria: Patient will be discharged  from SLP if patient refuses treatment 3 consecutive times without medical reason, if treatment goals not met, if there is a change in medical status, if patient makes no progress towards goals or if patient is discharged from hospital.  The above assessment, treatment plan, treatment alternatives and goals were discussed and mutually agreed upon: by patient   Germain Osgood, M.A. CCC-SLP [phone]  Germain Osgood 10/01/2013, 2:25 PM

## 2013-10-01 NOTE — Progress Notes (Signed)
Physical Therapy Session Note  Patient Details  Name: David Hall MRN: [id number] Date of Birth: 1956/02/16  Today's Date: 10/01/2013 Time: 0800-0858 Time Calculation (min): 58 min  Short Term Goals: Week 1:  PT Short Term Goal 1 (Week 1): Patient will be able to perform Bed Mobility with min-assist. PT Short Term Goal 2 (Week 1): Patient will be able to perform Transfers with min-assist. PT Short Term Goal 3 (Week 1): Patient will be able to propel manual w/c x 100' on flat surfaces with min-assist. PT Short Term Goal 4 (Week 1): Patient will be able to perform ambulate 30' with Mod-assist x 2 using LRAD.  Skilled Therapeutic Interventions/Progress Updates:    Pt received semi-reclined in bed; agreeable to therapy. Session focused on initiating gait training, stair negotiation, w/c mobility as well as addressing stability/independence with functional transfers and bed mobility. Supine<>sit with min A, HOB flat, no rails. Therapist explained L hemi-technique for w/c propulsion. Pt performed w/c mobility x130' in controlled environment with mod A using R hemi technique; manual facilitation of RLE technique provided during initial 40'; tactile cueing at R knee provided during subsequent 30' to increase RLE weightbearing, facilitate effectiveness of RLE propulsion technique. Pt performed w/c mobility for remainder of w/c mobility trial.  Gait x15' in controlled environment with LUE support at L halllway hand rail with +2A for w/c follow. Therapist positioned at pt's R side providing weight shifting (bilaterally). Pt able to actively initiate RLE advancement; however, therapist manually positioned RLE step secondary to limited step length, narrow BOS. Negotiation of 5 stairs with L hand rail, forward-facing with step-to pattern and +2A for safety, min verbal cues for technique, sequencing with effective pt carryover. Min manual stabilization provided anterior to L knee to prevent L knee buckling,  manual placement of RLE required during descent. Pt transported to room in w/c secondary to pt fatigue. Therapist departed with pt semi-reclined in bed with 3 bed rails up, bed alarm on, and all needs within reach.  Long term goals addressing dynamic sitting balance and w/c mobility downgraded to modified independent secondary to necessity of assistive devices, increased time for said aspects of mobility.  Therapy Documentation Precautions:  Fall; impulsive Pain:  7/10 headache pain in upper head; RN made aware Locomotion : Ambulation Ambulation/Gait Assistance: 1: +2 Total assist;Other (comment) (w/c follow) Wheelchair Mobility Distance: 130   See FIM for current functional status  Therapy/Group: Individual Therapy  Hobble, Malva Cogan 10/01/2013, 12:22 PM

## 2013-10-01 NOTE — Progress Notes (Signed)
Occupational Therapy Session Note  Patient Details  Name: David Hall MRN: [id number] Date of Birth: [birth year]  Today's Date: 10/01/2013 Time: 1020-1115 Time Calculation (min): 55 min  Short Term Goals: Week 1:  OT Short Term Goal 1 (Week 1): Patient will complete upper body bathing and dressing with setup assist OT Short Term Goal 2 (Week 1): Patient will complete lower body bathing and dressing with mod assist OT Short Term Goal 3 (Week 1): Patient will complete transfer, w/c <> toilet, with min assist OT Short Term Goal 4 (Week 1): Patient will complete seated grooming and hygiene with min assist  Skilled Therapeutic Interventions/Progress Updates:  Upon entering room, patient found supine in bed. Patient engaged in bed mobility with min assist and sat EOB. From here, patient transferred EOB> w/c with minimal assistance. Patient a bit impulsive, therapist talked with patient on this and patient receptive to following directions. Therapist explained all therapy disciplines and their role. Patient concerned with loosing his independence. Patient doffed shirt with supervision, completed UB bathing with set-up, then donned shirt with supervision. From here, patient completed grooming tasks seated at sink in w/c with minimal assistance from therapist. Therapist educated patient on some RUE exercises (PROM using left UE to assist). Patient donned bilateral socks and bilateral shoes, but needed assistance with tying shoes. Recommend elastic shoe strings as a compensatory strategy to increase independence with donning of shoes. Therapist donned bilateral leg rests & hemi tray and left patient seated in w/c beside bed with all needed items within reach.   Precautions:  Precautions Precautions: Fall Precaution Comments: impulsive Restrictions Weight Bearing Restrictions: No  See FIM for current functional status  Therapy/Group: Individual Therapy  Jordon Kristiansen 10/01/2013, 12:05 PM

## 2013-10-01 NOTE — Progress Notes (Signed)
Social Work Assessment and Plan Social Work Assessment and Plan  Patient Details  Name: David Hall MRN: [id number] Date of Birth: Jun 19, 1956  Today's Date: 10/01/2013  Problem List:  Patient Active Problem List   Diagnosis Date Noted  . CVA (cerebral infarction) 09/28/2013   Past Medical History:  Past Medical History  Diagnosis Date  . Hypertension   . GERD (gastroesophageal reflux disease)   . Bipolar disorder   . COPD (chronic obstructive pulmonary disease)   . Shortness of breath   . Insomnia, controlled    Past Surgical History: No past surgical history on file. Social History:  reports that he has been smoking Cigarettes.  He has been smoking about 1.00 pack per day. He does not have any smokeless tobacco history on file. He reports that he uses illicit drugs ("Crack" cocaine). His alcohol history is not on file.  Family / Support Systems Marital Status: Divorced Patient Roles: Parent Children: David Hall-daughter  [phone]  Other Supports: Two other children but they are busy with jobs and children Anticipated Caregiver: David Hall Ability/Limitations of Caregiver: David Hall is currently unemployed and able to provide assist to pt. Caregiver Availability: 24/7 Family Dynamics: Pt is close with David Hall and does see his other two children but they are busy with jobs and childtren.  He has a sister he has no contact with, his parents are deceased.  He has friends who are supportive also.  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     Abuse/Neglect Physical Abuse: Denies Verbal Abuse: Denies Sexual Abuse: Denies Exploitation of patient/patient's  resources: Denies Self-Neglect: Denies  Emotional Status Pt's affect, behavior adn adjustment status: Pt is encouraged by the progress he has made already, he was not able to move his right side when he got here, but is moving now.  He feels his length of stay will be shortened due to his recovery is quicker than staff thought.  He exercises in the bed and feels this is helping him.  He feels better regaridng his recovery now. Recent Psychosocial Issues: Other medical issues-been dealing with for years Pyschiatric History: Chart states-Bipolar pt denies-reports not treated for or takes any medicines for this.  He feels he does ok on his own and takes each day at a time.  Deferred depression screen due to pt felt it was not necessary.  Will monitor and ask team for inputinto pt's coping. Substance Abuse History: Pt denies drug use-and reports it was in the past.  Discussed the resources in the community and he felt they were not necessary.  Aware of risks and has ben spoken with by MD regarding this.  Patient / Family Perceptions, Expectations & Goals Pt/Family understanding of illness & functional limitations: Pt is able to explain his stroke and deficits.  He is pleased with the progress he has alreayd made and feels he will not be here long.  He doesn;'t like hospitals and his daughter will help him at home.  Will ask daughte rto come in and observe pt in therapies to prepare her for him coming home. Premorbid pt/family roles/activities: Father, Retiree, Home owner, Friend, etc Anticipated changes in roles/activities/participation: resume these roles once home Pt/family expectations/goals: Pt states: " I want to be as independent as I can be before  I leave, dont' want to burden my daughter."  David Hall states; " I will help with what he needs me to do."  [employer]: None Premorbid Home Care/DME Agencies: None Transportation available at discharge: David Hall can transport-pt  drove PTA Resource referrals recommended: Support group (specify) (CVA Support group)  Discharge Planning Living Arrangements: Children Support Systems: Children;Friends/neighbors Type of Residence: Private residence Insurance Resources: Medicare;Medicaid (specify county) 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  Pt hopes he will not require care at discharge and will do what he can to prevent this.  Will ask daughter to come in soon since pt will be a short length of stay due to fast progress he is making. Social Work Anticipated Follow Up Needs: HH/OP;Support Group  Clinical Impression Pleasant gentleman who is motivated and will do what he can to regain his independence before he leaves here.  His daughter is supportive and willing to assist him at home. Will encourage her to come in and go through therapies with pt so will be ready once discharge, pt moving quickly and will probably be a short length of stay.  Will try to address drug issues As build a rapport with pt.  David Hall 10/01/2013, 10:46 AM

## 2013-10-02 ENCOUNTER — Inpatient Hospital Stay (HOSPITAL_COMMUNITY): Payer: Medicare Other | Admitting: Occupational Therapy

## 2013-10-02 ENCOUNTER — Inpatient Hospital Stay (HOSPITAL_COMMUNITY): Payer: Medicare Other | Admitting: Physical Therapy

## 2013-10-02 ENCOUNTER — Inpatient Hospital Stay (HOSPITAL_COMMUNITY): Payer: Medicare Other

## 2013-10-02 DIAGNOSIS — I6350 Cerebral infarction due to unspecified occlusion or stenosis of unspecified cerebral artery: Secondary | ICD-10-CM | POA: Diagnosis not present

## 2013-10-02 DIAGNOSIS — I10 Essential (primary) hypertension: Secondary | ICD-10-CM | POA: Diagnosis not present

## 2013-10-02 DIAGNOSIS — F319 Bipolar disorder, unspecified: Secondary | ICD-10-CM | POA: Diagnosis not present

## 2013-10-02 NOTE — Progress Notes (Signed)
Occupational Therapy Session Note  Patient Details  Name: David Hall MRN: [id number] Date of Birth: 1956/03/08  Today's Date: 10/02/2013 Time: 1000-1100 Time Calculation (min): 60 min  Short Term Goals: Week 1:  OT Short Term Goal 1 (Week 1): Patient will complete upper body bathing and dressing with setup assist OT Short Term Goal 2 (Week 1): Patient will complete lower body bathing and dressing with mod assist OT Short Term Goal 3 (Week 1): Patient will complete transfer, w/c <> toilet, with min assist OT Short Term Goal 4 (Week 1): Patient will complete seated grooming and hygiene with min assist  Skilled Therapeutic Interventions/Progress Updates:  Patient resting in w/c upon arrival and declined shower.  Engaged in self care retraining to include sponge bath at sink, dress and groom.  Focused session on forced use and management of RUE and RLE, sit><stands, dynamic sitting and standing balance,  RLE and trunk sustained muscle activation during postural control challenges, lateral weight shifts and weight bearing in standing.  Patient able to lock right w/c break after set up and able to activate shoulder horizontal ADduction to initiate use of RUE for bathing.  Static standing assistance varied from Supervision to mod assist.  Therapy Documentation Precautions:  Precautions Precautions: Fall Precaution Comments: impulsive Restrictions Weight Bearing Restrictions: No Pain: Denies pain ADL: See FIM for current functional status  Therapy/Group: Individual Therapy  Jerik Falletta 10/02/2013, 12:12 PM

## 2013-10-02 NOTE — Progress Notes (Signed)
Subjective/Complaints: Practicing movements, able to push R arm forward, denies numbness on Right side. A 12 point review of systems has been performed and if not noted above is otherwise negative.   Objective: Vital Signs: Blood pressure 174/63, pulse 60, temperature 98.1 F (36.7 C), temperature source Oral, resp. rate 18, SpO2 98.00%. No results found.  Recent Labs  10/01/13 0615  WBC 7.9  HGB 12.4*  HCT 36.9*  PLT 210    Recent Labs  10/01/13 0615  NA 132*  K 3.7  CL 96  GLUCOSE 103*  BUN 17  CREATININE 0.96  CALCIUM 8.7   CBG (last 3)   Recent Labs  09/30/13 2051 10/01/13 0744 10/01/13 1149  GLUCAP 113* 79 132*    Wt Readings from Last 3 Encounters:  09/28/13 89.812 kg (198 lb)    Physical Exam:  Constitutional: . He appears well-developed and well-nourished.  HENT:  Head: Normocephalic and atraumatic. Ear ring left ear  Eyes: Conjunctivae are normal. Pupils are equal, round, and reactive to light.  Neck: Normal range of motion. Neck supple.  Cardiovascular: Normal rate and regular rhythm. No murmurs  Respiratory: Effort normal and breath sounds normal. No respiratory distress. He has occ wheezes. No rales  GI: Soft. Bowel sounds are normal. He exhibits no distension.  no tenderness.  Musculoskeletal: He exhibits trace edema RUE.[name]  Neurological: exam unchanged: He is alert and oriented to person, place, and time.  Mild dysarthria with occasional word finding difficulties. . Mild facial weakness with minimal tongue deviation. Follows basic commands without difficulty. Right hemiparesis: RUE 0/5 deltoid, bicep,trace tricep,0 HI. RLE 2- HF, 1+ KE, 1+ ADF and APF. Sensation appears grossly intact RUE and RLE. DTR's 2+ Right arm and leg. 1+ Left side. Reasonable insight and awareness.  Skin: Skin is warm and dry.  Psych: no lability   Assessment/Plan: 1. Functional deficits secondary to left lacunar CR, BG, and IC infarcts, left subcortical  frontal infarct which require 3+ hours per day of interdisciplinary therapy in a comprehensive inpatient rehab setting. Physiatrist is providing close team supervision and 24 hour management of active medical problems listed below. Physiatrist and rehab team continue to assess barriers to discharge/monitor patient progress toward functional and medical goals. FIM: FIM - Bathing Bathing Steps Patient Completed: Chest;Right Arm;Left Arm;Abdomen Bathing: 2: Max-Patient completes 3-4 7f 10 parts or 25-49%  FIM - Upper Body Dressing/Undressing Upper body dressing/undressing steps patient completed: Thread/unthread right sleeve of pullover shirt/dresss;Thread/unthread left sleeve of pullover shirt/dress;Put head through opening of pull over shirt/dress;Pull shirt over trunk Upper body dressing/undressing: 5: Supervision: Safety issues/verbal cues FIM - Lower Body Dressing/Undressing Lower body dressing/undressing steps patient completed: Don/Doff right sock;Don/Doff left sock;Don/Doff right shoe;Don/Doff left shoe Lower body dressing/undressing: 4: Min-Patient completed 75 plus % of tasks (min assist for socks and shoes only)     FIM - Radio producer Devices: Grab bars Toilet Transfers: 3-To toilet/BSC: Mod A (lift or lower assist);3-From toilet/BSC: Mod A (lift or lower assist)  FIM - Bed/Chair Transfer Bed/Chair Transfer Assistive Devices: Arm rests Bed/Chair Transfer: 3: Bed > Chair or W/C: Mod A (lift or lower assist);4: Supine > Sit: Min A (steadying Pt. > 75%/lift 1 leg);4: Sit > Supine: Min A (steadying pt. > 75%/lift 1 leg);4: Chair or W/C > Bed: Min A (steadying Pt. > 75%)  FIM - Locomotion: Wheelchair Distance: 130 Locomotion: Wheelchair: 2: Travels 50 - 149 ft with moderate assistance (Pt: 50 -  74%) FIM - Locomotion: Ambulation Locomotion: Ambulation Assistive Devices: Other (comment) (L hallway hand rail) Ambulation/Gait Assistance: 1: +2 Total  assist;Other (comment) (w/c follow) Locomotion: Ambulation: 1: Two helpers  Comprehension Comprehension Mode: Auditory Comprehension: 6-Follows complex conversation/direction: With extra time/assistive device  Expression Expression Mode: Verbal Expression: 4-Expresses basic 75 - 89% of the time/requires cueing 10 - 24% of the time. Needs helper to occlude trach/needs to repeat words.  Social Interaction Social Interaction: 6-Interacts appropriately with others with medication or extra time (anti-anxiety, antidepressant).  Problem Solving Problem Solving: 6-Solves complex problems: With extra time  Memory Memory: 6-More than reasonable amt of time  Medical Problem List and Plan:  1. DVT Prophylaxis/Anticoagulation: Pharmaceutical: Lovenox  2. Pain Management: prn tylenol.  3. Bipolar disorder/Mood: Continue tegretol, ambien, as well as Ambien for chronic insomnia. Team to provide ego support. LSCW to follow for evaluation and support.  4. Neuropsych: This patient is capable of making decisions on his own behalf.  5. HTN: continued poor control. increase prinizide to 40, continue metoprolol and imdur. Increased HCTZ to 25mg  yesterday 6. COPD: will add albuterol MDI prn SOB.  7. Hyperglycemia:  check Hgb A1C--check CBG , am value low today, rec hs snack.  8. Dyslipidemia: continue lipitor.  9. Drug/Tobacco abuse: education provided today regarding stroke risks, etc. Education ongoing  LOS (Days) [place] E 10/02/2013 7:33 AM

## 2013-10-02 NOTE — Progress Notes (Signed)
Speech Language Pathology Daily Session Note  Patient Details  Name: David Hall MRN: [id number] Date of Birth: 10/11/1955  Today's Date: 10/02/2013 Time: 0830-0900 Time Calculation (min): 30 min  Short Term Goals: Week 1: SLP Short Term Goal 1 (Week 1): Pt will uutilize safe swallowing strategies with current diet with Mod I SLP Short Term Goal 2 (Week 1): Pt will utilize speech intelligibility strategies at the conversational level with supervision level verbal cues.  Skilled Therapeutic Interventions: Skilled treatment focused on speech goals. SLP facilitated session with introduction of speech intelligibility strategies and education including examples regarding how to use them. Pt practiced strategies at the word level, and then utilized strategies at the conversational level with Min cues. 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 He continues to report that his speech is more slurred now, and that after he talks for a while he feels more fatigued. Pt also reported that he has been consuming regular textures without incidence, and that he prefers these textures. Continue plan of care.   FIM:  Comprehension Comprehension Mode: Auditory Comprehension: 6-Follows complex conversation/direction: With extra time/assistive device Expression Expression Mode: Verbal Expression: 4-Expresses basic 75 - 89% of the time/requires cueing 10 - 24% of the time. Needs helper to occlude trach/needs to repeat words. Social Interaction Social Interaction: 6-Interacts appropriately with others with medication or extra time (anti-anxiety, antidepressant). Problem Solving Problem Solving: 5-Solves basic problems: With no assist Memory Memory: 6-More than reasonable amt of time  Pain Pain Assessment Pain Assessment: No/denies pain  Therapy/Group: Individual Therapy   Germain Osgood, M.A.  CCC-SLP [phone]  Germain Osgood 10/02/2013, 9:49 AM

## 2013-10-02 NOTE — Progress Notes (Signed)
Physical Therapy Session Note  Patient Details  Name: David Hall MRN: [id number] Date of Birth: April 10, 1956  Today's Date: 10/02/2013 Time: [phone] and 1125-1204 Time Calculation (min): 57 min and 39 min  Short Term Goals: Week 1:  PT Short Term Goal 1 (Week 1): Patient will be able to perform Bed Mobility with min-assist. PT Short Term Goal 2 (Week 1): Patient will be able to perform Transfers with min-assist. PT Short Term Goal 3 (Week 1): Patient will be able to propel manual w/c x 100' on flat surfaces with min-assist. PT Short Term Goal 4 (Week 1): Patient will be able to perform ambulate 30' with Mod-assist x 2 using LRAD.  Skilled Therapeutic Interventions/Progress Updates:    Treatment Session 1: Pt received seated in w/c with quick release belt on. Pt agreeable to therapy. Session focused on increasingly stability/independence with gait, functional transfers. Pt performed self-propulsion of w/c x150' in controlled environment with L hemi technique requiring min A, tactile cueing at L knee to increase LLE weightbearing.   Performed gait x30' with LUE support at hallway hand rail with min-mod A. Pt able to initiate RLE advancement but requires assist for effective placement (increased BOS, increase step length). Attempted gait x15' with rolling walker, R hand orthosis with max A for stability/balance, bilat weight shifting, RLE placement. Gait trial ended secondary to pt fatigue, difficulty actively initiating RLE step length despite adequate L weight shift.  Pt performed w/c mobility x150' in controlled environment with supervision to return to room. Once in room, this PT demonstrated, explained technique for squat pivot transfer. Pt performed squat pivot from bed<>w/c with mod A. Pt performed multiple sit<>stand transfers from EOB without UE support initially with max A, manual facilitation of anterior weight shift, and verbal cueing for L hand placement on bed. Final sit<>stand  transfer performed with min A. Static standing x4 trials (x10-30 seconds each) with LUE support, min-mod A. Performed stand pivot from bed>w/c with min A. Therapist departed room with pt seated in w/c with quick release belt on for safety and all needs within reach.   Treatment Session 2: Pt received seated in w/c with quick release belt on ; agreeable to therapy. Session focused on RLE NMR. Pt performed self-propulsion of w/c x150' in controlled environment with L hemi technique requiring min A. Once in gym, pt performed w/c<>mat table with min A, supine<>sit on mat table with min A. Performed supine<>sit again with mod verbal cueing to decrease speed of movement, multimodal cueing for technique. Pt performed supine<>sit third time with supervision, effective carryover of technique. Once in L side lying on mat, performed RLE NMR. See below for detailed description of interventions. Returned to room via w/c mobility x150' in controlled environment with L hemi technique, supervision. Performed toilet transfer with mod A using grab bar. Static sitting on toilet x60 seconds without UE support, supervision for stability/balance. Per RN, pt has been utilizing call bell appropriately for past 2 days; therefore, safety plan modified to omit quick release belt. Therapist departed room with pt seated in w/c with all needs within reach.   Therapy Documentation Precautions:  Precautions Precautions: Fall Precaution Comments: impulsive Restrictions Weight Bearing Restrictions: No Vital Signs: Therapy Vitals Pulse Rate: 59 BP: 164/68 mmHg Patient Position, if appropriate: Sitting Pain: Pain Assessment Pain Assessment: No/denies pain Pain Score: 0-No pain Pain Type: Acute pain Pain Location: Head Pain Descriptors / Indicators: Headache Pain Frequency: Intermittent Pain Onset: Gradual Patients Stated Pain Goal: 3 Pain Intervention(s): Medication (See  eMAR) Locomotion : Ambulation Ambulation/Gait  Assistance: 2: Max Oceanographer: 150  NMR: L side lying on mat with mirror anterior to pt for visual feedback: performed RLE AAROM using powder board to eliminate friction, gravity. Pt exhibits ability to perform active R hip flexion/extension and knee flexion through full ROM. Pt able to perform active R knee extension, ankle plantarflexon through >50% of ROM. Trace R ankle dorsiflexion noted.  See FIM for current functional status  Therapy/Group: Individual Therapy  Lakecia Deschamps, Malva Cogan 10/02/2013, 12:19 PM

## 2013-10-02 NOTE — Progress Notes (Signed)
Patient states he has had Restless Leg Syndrome for many years and did not realize that there was medication for this type of pain. Is there anything that we can give him to help with his shaking legs and restlessness?

## 2013-10-02 NOTE — Progress Notes (Signed)
Occupational Therapy Session Note  Patient Details  Name: TROI FLORENDO MRN: [id number] Date of Birth: 1955-12-27  Today's Date: 10/02/2013 Time: 1330-1400 Time Calculation (min): 30 min  Skilled Therapeutic Interventions/Progress Updates:    Pt went down to the OT gym via wheelchair with therapist pushing secondary to short session.  Transferred to mat stand pivot with mod facilitation as the right knee buckled during transfer to the left.  Began first by working on closed chain weightbearing placing the RUE on the mat and having the pt reach across with his left hand, while supporting himself with the right arm.  Pt tends to use more of his left trunk to self correct his balance so had him just work on coming 1/2 way down onto his right arm and then pushing back to midline while keeping his right ear down to his shoulder.  This eliminated the use of his left trunk and put more emphasis on a lateral weight shift in the right trunk as well as activation of the right arm.  Progressed to using tilted stool and having him push the stool forward and backward in controlled motion to target without over compensation as the trunk.  He was able to advance the stool with minimal compensation.  Returned to chair with mod assist stand pivot to finish session.    Therapy Documentation Precautions:  Precautions Precautions: Fall Precaution Comments: impulsive Restrictions Weight Bearing Restrictions: No  Pain: Pain Assessment Pain Assessment: No/denies pain Pain Score: 7  Pain Type: Acute pain Pain Location: Head Pain Descriptors / Indicators: Headache Pain Frequency: Intermittent Pain Onset: Gradual Patients Stated Pain Goal: 3 Pain Intervention(s): Medication (See eMAR)  See FIM for current functional status  Therapy/Group: Individual Therapy  Renea Schoonmaker OTR/L 10/02/2013, 3:35 PM

## 2013-10-03 ENCOUNTER — Inpatient Hospital Stay (HOSPITAL_COMMUNITY): Payer: Medicare Other

## 2013-10-03 ENCOUNTER — Inpatient Hospital Stay (HOSPITAL_COMMUNITY): Payer: Medicare Other | Admitting: Physical Therapy

## 2013-10-03 ENCOUNTER — Inpatient Hospital Stay (HOSPITAL_COMMUNITY): Payer: Medicare Other | Admitting: Occupational Therapy

## 2013-10-03 DIAGNOSIS — F319 Bipolar disorder, unspecified: Secondary | ICD-10-CM | POA: Diagnosis not present

## 2013-10-03 DIAGNOSIS — I10 Essential (primary) hypertension: Secondary | ICD-10-CM | POA: Diagnosis not present

## 2013-10-03 DIAGNOSIS — I6350 Cerebral infarction due to unspecified occlusion or stenosis of unspecified cerebral artery: Secondary | ICD-10-CM | POA: Diagnosis not present

## 2013-10-03 LAB — BASIC METABOLIC PANEL
BUN: 17 mg/dL (ref 6–23)
CO2: 25 mEq/L (ref 19–32)
Calcium: 8.6 mg/dL (ref 8.4–10.5)
Chloride: 96 mEq/L (ref 96–112)
Creatinine, Ser: 1.11 mg/dL (ref 0.50–1.35)
GFR calc Af Amer: 83 mL/min — ABNORMAL LOW (ref >90)
GFR calc non Af Amer: 72 mL/min — ABNORMAL LOW (ref >90)
Glucose, Bld: 94 mg/dL (ref 70–99)
Potassium: 4.2 mEq/L (ref 3.7–5.3)
Sodium: 135 mEq/L — ABNORMAL LOW (ref 137–147)

## 2013-10-03 MED ORDER — LISINOPRIL 20 MG PO TABS
20.0000 mg | ORAL_TABLET | Freq: Two times a day (BID) | ORAL | Status: DC
  Administered 2013-10-04 – 2013-10-11 (×15): 20 mg via ORAL
  Filled 2013-10-03 (×17): qty 1

## 2013-10-03 MED ORDER — ROPINIROLE HCL 0.25 MG PO TABS
0.2500 mg | ORAL_TABLET | Freq: Every day | ORAL | Status: DC
  Administered 2013-10-03 – 2013-10-10 (×8): 0.25 mg via ORAL
  Filled 2013-10-03 (×9): qty 1

## 2013-10-03 MED ORDER — ROPINIROLE HCL 0.25 MG PO TABS: 0.2500 mg | ORAL_TABLET | Freq: Every day | ORAL | Status: DC

## 2013-10-03 MED ORDER — TRAMADOL HCL 50 MG PO TABS
50.0000 mg | ORAL_TABLET | Freq: Four times a day (QID) | ORAL | Status: DC | PRN
  Administered 2013-10-03 – 2013-10-11 (×19): 50 mg via ORAL
  Filled 2013-10-03 (×21): qty 1

## 2013-10-03 MED ORDER — HYDROCHLOROTHIAZIDE 12.5 MG PO CAPS
12.5000 mg | ORAL_CAPSULE | Freq: Every day | ORAL | Status: DC
  Administered 2013-10-04: 12.5 mg via ORAL
  Filled 2013-10-03 (×3): qty 1

## 2013-10-03 NOTE — Progress Notes (Signed)
Physical Therapy Note  Patient Details  Name: David Hall MRN: [id number] Date of Birth: 1955/12/26 Today's Date: 10/03/2013  1355-1425 (30 minutes) individual Pain : no reported pain Focus of treatment: gait training Treatment: Sit to stand with vcs for hand placement; gait 25 feet x 2, 50 feet X 1 RW + hand splint Rt + walk on AFO min/mod assist to guide AD + min vcs for Rt knee extension in stance .  Elois Averitt,JIM 10/03/2013, 2:17 PM

## 2013-10-03 NOTE — Progress Notes (Signed)
Occupational Therapy Session Note  Patient Details  Name: David Hall MRN: [id number] Date of Birth: [birth year]  Today's Date: 10/03/2013 Time: 0930-1030 Time Calculation (min): 60 min  Short Term Goals: Week 1:  OT Short Term Goal 1 (Week 1): Patient will complete upper body bathing and dressing with setup assist OT Short Term Goal 2 (Week 1): Patient will complete lower body bathing and dressing with mod assist OT Short Term Goal 3 (Week 1): Patient will complete transfer, w/c <> toilet, with min assist OT Short Term Goal 4 (Week 1): Patient will complete seated grooming and hygiene with min assist  Skilled Therapeutic Interventions/Progress Updates:  Patient resting in w/c upon arrival.  Engaged in shower and dressing with focus on safe stand pivot transfers w/c><shower chair in walk in shower and heavily relying on grab bar.  Also focused on sit><stands and dynamic standing balance. Patient chose to remain seated today for entire bath and performed a lean to wash his buttocks.  Patient requires min cues to manage RUE as he often leave it hanging off the side of the w/c during BADL tasks.  Therapy Documentation Precautions:  Precautions Precautions: Fall Precaution Comments: impulsive Restrictions Weight Bearing Restrictions: No  Pain: Denies pain ADL: See FIM for current functional status  Therapy/Group: Individual Therapy  Annibelle Brazie 10/03/2013, 12:25 PM

## 2013-10-03 NOTE — Progress Notes (Signed)
Social Work Patient ID: David Hall, male   DOB: 25-Jun-1956, 58 y.o.   MRN: [id number] Met with pt to inform of team conference goals-mod/i to supervision for ambulation.  Discharge date on 3/6.  Discussed having daughter come in next Week and go through therapies with pt prior to discharge.  He was agreeable to this and will talk with daughter about a good day next week.  He is pleased With his progress and feels 3/6 is a good date.

## 2013-10-03 NOTE — Progress Notes (Signed)
Subjective/Complaints: Practicing movements, able to push R arm forward, denies numbness on Right side. Discussed deficits with speech, no aphasia, mainly dysarthria A 12 point review of systems has been performed and if not noted above is otherwise negative.   Objective: Vital Signs: Blood pressure 167/72, pulse 55, temperature 98 F (36.7 C), temperature source Oral, resp. rate 18, weight 90.266 kg (199 lb), SpO2 97.00%. No results found.  Recent Labs  10/01/13 0615  WBC 7.9  HGB 12.4*  HCT 36.9*  PLT 210    Recent Labs  10/01/13 0615  NA 132*  K 3.7  CL 96  GLUCOSE 103*  BUN 17  CREATININE 0.96  CALCIUM 8.7   CBG (last 3)   Recent Labs  09/30/13 2051 10/01/13 0744 10/01/13 1149  GLUCAP 113* 79 132*    Wt Readings from Last 3 Encounters:  10/03/13 90.266 kg (199 lb)  09/28/13 89.812 kg (198 lb)    Physical Exam:  Constitutional: . He appears well-developed and well-nourished.  HENT:  Head: Normocephalic and atraumatic. Ear ring left ear  Eyes: Conjunctivae are normal. Pupils are equal, round, and reactive to light.  Neck: Normal range of motion. Neck supple.  Cardiovascular: Normal rate and regular rhythm. No murmurs  Respiratory: Effort normal and breath sounds normal. No respiratory distress. He has occ wheezes. No rales  GI: Soft. Bowel sounds are normal. He exhibits no distension.  no tenderness.  Musculoskeletal: He exhibits trace edema RUE.[name]  Neurological: exam unchanged: He is alert and oriented to person, place, and time.  Mild dysarthria with occasional word finding difficulties. . Mild facial weakness with minimal tongue deviation. Follows basic commands without difficulty. Right hemiparesis: RUE 0/5 deltoid, bicep,trace tricep,0 HI. RLE 2- HF, 1+ KE, 1+ ADF and APF. Sensation appears grossly intact RUE and RLE. DTR's 2+ Right arm and leg. 1+ Left side. Reasonable insight and awareness.  Skin: Skin is warm and dry.  Psych: no  lability   Assessment/Plan: 1. Functional deficits secondary to left lacunar CR, BG, and IC infarcts, left subcortical frontal infarct which require 3+ hours per day of interdisciplinary therapy in a comprehensive inpatient rehab setting. Physiatrist is providing close team supervision and 24 hour management of active medical problems listed below. Physiatrist and rehab team continue to assess barriers to discharge/monitor patient progress toward functional and medical goals. FIM: FIM - Bathing Bathing Steps Patient Completed: Chest;Right Arm;Left Arm;Abdomen;Front perineal area;Buttocks;Right upper leg;Left upper leg;Right lower leg (including foot);Left lower leg (including foot) Bathing: 4: Steadying assist  FIM - Upper Body Dressing/Undressing Upper body dressing/undressing steps patient completed: Thread/unthread right sleeve of pullover shirt/dresss;Thread/unthread left sleeve of pullover shirt/dress;Put head through opening of pull over shirt/dress;Pull shirt over trunk Upper body dressing/undressing: 5: Supervision: Safety issues/verbal cues FIM - Lower Body Dressing/Undressing Lower body dressing/undressing steps patient completed: Don/Doff right sock;Don/Doff left sock;Don/Doff right shoe;Don/Doff left shoe;Thread/unthread right pants leg;Thread/unthread left pants leg;Pull pants up/down Lower body dressing/undressing: 4: Steadying Assist  FIM - Toileting Toileting steps completed by patient: Adjust clothing prior to toileting;Adjust clothing after toileting;Performs perineal hygiene Toileting Assistive Devices: Grab bar or rail for support Toileting: 6: Assistive device: No helper  FIM - Radio producer Devices: Grab bars Toilet Transfers: 3-To toilet/BSC: Mod A (lift or lower assist);3-From toilet/BSC: Mod A (lift or lower assist)  FIM - Bed/Chair Transfer Bed/Chair Transfer Assistive Devices: Arm rests Bed/Chair Transfer: 3: Bed > Chair or W/C:  Mod A (lift or lower  assist);3: Chair or W/C > Bed: Mod A (lift or lower assist)  FIM - Locomotion: Wheelchair Distance: 150 Locomotion: Wheelchair: 4: Travels 150 ft or more: maneuvers on rugs and over door sillls with minimal assistance (Pt.>75%) FIM - Locomotion: Ambulation Locomotion: Ambulation Assistive Devices: Other (comment);Orthosis;Walker - Rolling (L hallway hand rail then rolling walker with R hand orthosis) Ambulation/Gait Assistance: 2: Max assist Locomotion: Ambulation: 1: Travels less than 50 ft with maximal assistance (Pt: 25 - 49%)  Comprehension Comprehension Mode: Auditory Comprehension: 6-Follows complex conversation/direction: With extra time/assistive device  Expression Expression Mode: Verbal Expression: 4-Expresses basic 75 - 89% of the time/requires cueing 10 - 24% of the time. Needs helper to occlude trach/needs to repeat words.  Social Interaction Social Interaction: 6-Interacts appropriately with others with medication or extra time (anti-anxiety, antidepressant).  Problem Solving Problem Solving: 5-Solves basic problems: With no assist  Memory Memory: 6-More than reasonable amt of time  Medical Problem List and Plan:  1. DVT Prophylaxis/Anticoagulation: Pharmaceutical: Lovenox  2. Pain Management: prn tylenol.  3. Bipolar disorder/Mood: Continue tegretol, ambien, as well as Ambien for chronic insomnia. Team to provide ego support. LSCW to follow for evaluation and support.  4. Neuropsych: This patient is capable of making decisions on his own behalf.  5. HTN: continued poor control. increase prinizide to 40, continue metoprolol and imdur. Increased HCTZ to 25mg   6. COPD: will add albuterol MDI prn SOB.  7. Hyperglycemia:  check Hgb A1C--check CBG , am value low today, rec hs snack.  8. Dyslipidemia: continue lipitor.  9. Drug/Tobacco abuse: education provided today regarding stroke risks, etc. Education ongoing  LOS (Days) [place] E 10/03/2013 8:33 AM

## 2013-10-03 NOTE — Progress Notes (Signed)
Patient with history of RLS and disrupted sleep due to symptoms. Will start low dose Requip to help with symptoms.

## 2013-10-03 NOTE — Progress Notes (Signed)
Speech Language Pathology Daily Session Note  Patient Details  Name: David Hall MRN: [id number] Date of Birth: 06-Apr-1956  Today's Date: 10/03/2013 Time: 0830-0900 Time Calculation (min): 30 min  Short Term Goals: Week 1: SLP Short Term Goal 1 (Week 1): Pt will uutilize safe swallowing strategies with current diet with Mod I SLP Short Term Goal 2 (Week 1): Pt will utilize speech intelligibility strategies at the conversational level with supervision level verbal cues.  Skilled Therapeutic Interventions: Skilled treatment focused on communication goals. SLP facilitated session with review of speech intelligibility strategies. Pt utilized speech intelligibility strategies at the sentence-conversational level throughout 6-step sequencing task with Min cues. Pt completed sequencing task with extra time for self-correction. Pt communicated at the conversational level with SLP with supervision verbal cues. Continue plan of care.   FIM:  Comprehension Comprehension Mode: Auditory Comprehension: 6-Follows complex conversation/direction: With extra time/assistive device Expression Expression Mode: Verbal Expression: 4-Expresses basic 75 - 89% of the time/requires cueing 10 - 24% of the time. Needs helper to occlude trach/needs to repeat words. Social Interaction Social Interaction: 6-Interacts appropriately with others with medication or extra time (anti-anxiety, antidepressant). Problem Solving Problem Solving: 6-Solves complex problems: With extra time Memory Memory: 6-More than reasonable amt of time  Pain Pain Assessment Pain Assessment: 0-10 Pain Score: 7  Pain Type: Acute pain Pain Location: Head Pain Descriptors / Indicators: Headache Pain Frequency: Intermittent Pain Onset: Gradual Patients Stated Pain Goal: 3 Pain Intervention(s): Medication (See eMAR)  Therapy/Group: Individual Therapy   Germain Osgood, M.A. CCC-SLP [phone]  Germain Osgood 10/03/2013,  10:57 AM

## 2013-10-03 NOTE — Progress Notes (Signed)
Physical Therapy Session Note  Patient Details  Name: David Hall MRN: [id number] Date of Birth: 09/09/1955  Today's Date: 10/03/2013 Time: 1128-1206 and 1433-1500 Time Calculation (min): 38 min and 27 min  Short Term Goals: Week 1:  PT Short Term Goal 1 (Week 1): Patient will be able to perform Bed Mobility with min-assist. PT Short Term Goal 2 (Week 1): Patient will be able to perform Transfers with min-assist. PT Short Term Goal 3 (Week 1): Patient will be able to propel manual w/c x 100' on flat surfaces with min-assist. PT Short Term Goal 4 (Week 1): Patient will be able to perform ambulate 30' with Mod-assist x 2 using LRAD.  Skilled Therapeutic Interventions/Progress Updates:    Treatment Session 1: Pt received seated in w/c; agreeable to PT. Session focused on attempting gait with R AFO. Performed self-propulsion of w/c 2x150' in controlled environment using L hemi technique. Donned R AFO ([place]) to promote R ankle dorsiflexion. Performed sit<>stand x3 reps from w/c with LUE support at hallway hand rail with min A, tactile cueing at R knee to promote RLE weightbearing. Performed gait 1x10', 2x15' in controlled environment with LUE support at L hallway hand rial requiring mod A. Manual facilitation at R axilla, L ribs for upright posture, manual facilitation for bilat weight shifting, manual repositioning of RLE during initial 10' gait trial. Mod verbal cueing for upright posture and to promote RLE stance stability. Pt exhibits difficulty maintaining R knee extension during RLE stance phase of gait. Will re-attempt gait with R toe off to promote R anle dorsiflexion while allowing R knee extension during stance phase. Session ended in pt room, where pt was left seated in w/c with all needs within reach.  Treatment Session 2: Pt received seated in w/c in gym having just finished PT session. Daughter present. Pt agreeable to session. No c/o pain until post-session, when pt reported  headache; RN notified. Session focused on assessing/addressing gait stability with R AFO different from that used in AM session. Performed gait x41' in controlled environment wearing R AFO ([place] WalkOn for ankle dorsiflexion assist) with rolling walker, R hand orthosis requiring min A. Gait trial ended due to loss of balance secondary to R toe catch. Pt transported to room in w/c secondary to pt fatigue. Once in room, performed squat pivot transfer from w/c>bed with min A. Sit>supine with supervision, verbal cueing for technique. Therapist departed with pt semi-reclined in bed with 3 bed rails up, bed alarm on, daughter present, and all needs within reach.   Therapy Documentation Precautions:  Precautions Precautions: Fall Precaution Comments: impulsive Restrictions Weight Bearing Restrictions: No Pain: Pain Assessment Pain Assessment: No/denies pain Pain Score: 0-No pain Pain Type: Acute pain Pain Location: Head Pain Descriptors / Indicators: Headache Pain Frequency: Intermittent Pain Onset: Gradual Patients Stated Pain Goal: 3 Pain Intervention(s): Medication (See eMAR) Locomotion : Ambulation Ambulation/Gait Assistance: 3: Mod assist Wheelchair Mobility Distance: 150   See FIM for current functional status  Therapy/Group: Individual Therapy  Tammie Yanda, Malva Cogan 10/03/2013, 12:17 PM

## 2013-10-03 NOTE — Patient Care Conference (Signed)
Inpatient RehabilitationTeam Conference and Plan of Care Update Date: 10/03/2013   Time: 10;30 AM    Patient Name: David Hall      Medical Record Number: [id number]  Date of Birth: 1955-12-13 Sex: Male         Room/Bed: 4W07C/4W07C-01 Payor Info: Payor: MEDICARE / Plan: MEDICARE PART A AND B / Product Type: *No Product type* /    Admitting Diagnosis: Frontal CVA  Admit Date/Time:  09/28/2013  2:54 PM Admission Comments: No comment available   Primary Diagnosis:  <principal problem not specified> Principal Problem: <principal problem not specified>  Patient Active Problem List   Diagnosis Date Noted  . CVA (cerebral infarction) 09/28/2013    Expected Discharge Date: Expected Discharge Date: 10/12/13  Team Members Present: Physician leading conference: Dr. Alysia Penna Social Worker Present: Alfonse Alpers, LCSW;Becky Naphtali Riede, LCSW Nurse Present: Other (comment) Jearl Klinefelter) PT Present: Georjean Mode, PT;Other (comment) Jilda Roche) OT Present: Antony Salmon, Roland Earl, OT SLP Present: Germain Osgood, SLP PPS Coordinator present : Ileana Ladd, Lelan Pons, RN, CRRN     Current Status/Progress Goal Weekly Team Focus  Medical   low frustration tolerance  improve mood  neuropsych eval   Bowel/Bladder   Cont. Bowel and bladder, LBM 10/02/13   continent of bladder and bowel  Remain continent of bladder and bowel   Swallow/Nutrition/ Hydration   regular textures and thin liquids with intermittent supervision  Mod I with least restrictive PO  diet tolerance, education   ADL's   min assist bathing, grooming, and lower body dressing, supervision UB dressing, mod assist functional transfers  supervision bathing, shower transfer, and  LB dressing, Mod I toilet tranfers, toilet task, and UB dressing  R NMR, safety awareness, functional transfers, standing balance, coordination, activity tolerance   Mobility   Min-Mod A with transfers, Max A with gait,  +2A with stair negotiation, supervision with w/c mobility  Mod I with transfers and w/c mobility, supervision with gait  Transfer/gait training, standing balance, stair negotiation, R NMR, D/C planning, functional endurance   Communication   Min cues for speech strategies at the sentence-conversational level  Mod I  increase use of speech strategies   Safety/Cognition/ Behavioral Observations    No unsafe behaviors        Pain   No c/o pain   Pain <3  Assess for and treat pain q shift with prn pain meds   Skin   No skin breakdown  No skin breakdown  Assess skin q shift for any skin breakdown      *See Care Plan and progress notes for long and short-term goals.  Barriers to Discharge: needs to be Mod/I toileting    Possible Resolutions to Barriers:  daughter caregiver    Discharge Planning/Teaching Needs:  Home with daughter who can provide assist to pt.  Pt feels he is doing well      Team Discussion:  Restless leg-MD working on.  Neuro-psych to see on Monday.  Making good progress.  Will need family education next week with daughter.  Revisions to Treatment Plan:  None   Continued Need for Acute Rehabilitation Level of Care: The patient requires daily medical management by a physician with specialized training in physical medicine and rehabilitation for the following conditions: Daily direction of a multidisciplinary physical rehabilitation program to ensure safe treatment while eliciting the highest outcome that is of practical value to the patient.: Yes Daily medical management of patient stability for increased activity during participation in  an intensive rehabilitation regime.: Yes Daily analysis of laboratory values and/or radiology reports with any subsequent need for medication adjustment of medical intervention for : Neurological problems  Abrial Arrighi, Gardiner Rhyme 10/03/2013, 1:26 PM

## 2013-10-03 NOTE — Progress Notes (Signed)
Social Work [name], LCSW Social Worker Signed  Patient Care Conference Service date: 10/03/2013 1:26 PM  Inpatient RehabilitationTeam Conference and Plan of Care Update Date: 10/03/2013   Time: 10;30 AM     Patient Name: David Hall       Medical Record Number: [id number]   Date of Birth: 10-Jul-1956 Sex: Male         Room/Bed: 4W07C/4W07C-01 Payor Info: Payor: MEDICARE / Plan: MEDICARE PART A AND B / Product Type: *No Product type* /   Admitting Diagnosis: Frontal CVA   Admit Date/Time:  09/28/2013  2:54 PM Admission Comments: No comment available   Primary Diagnosis:  <principal problem not specified> Principal Problem: <principal problem not specified>    Patient Active Problem List     Diagnosis  Date Noted   .  CVA (cerebral infarction)  09/28/2013     Expected Discharge Date: Expected Discharge Date: 10/12/13  Team Members Present: Physician leading conference: Dr. Alysia Penna Social Worker Present: Alfonse Alpers, LCSW;Becky Tiwan Schnitker, LCSW Nurse Present: Other (comment) Jearl Klinefelter) PT Present: Georjean Mode, PT;Other (comment) Jilda Roche) OT Present: Antony Salmon, Roland Earl, OT SLP Present: Germain Osgood, SLP PPS Coordinator present : Ileana Ladd, Lelan Pons, RN, CRRN        Current Status/Progress  Goal  Weekly Team Focus   Medical     low frustration tolerance  improve mood  neuropsych eval   Bowel/Bladder     Cont. Bowel and bladder, LBM 10/02/13  continent of bladder and bowel  Remain continent of bladder and bowel   Swallow/Nutrition/ Hydration     regular textures and thin liquids with intermittent supervision  Mod I with least restrictive PO  diet tolerance, education   ADL's     min assist bathing, grooming, and lower body dressing, supervision UB dressing, mod assist functional transfers  supervision bathing, shower transfer, and  LB dressing, Mod I toilet tranfers, toilet task, and UB dressing  R NMR, safety  awareness, functional transfers, standing balance, coordination, activity tolerance   Mobility     Min-Mod A with transfers, Max A with gait, +2A with stair negotiation, supervision with w/c mobility  Mod I with transfers and w/c mobility, supervision with gait  Transfer/gait training, standing balance, stair negotiation, R NMR, D/C planning, functional endurance   Communication     Min cues for speech strategies at the sentence-conversational level  Mod I  increase use of speech strategies   Safety/Cognition/ Behavioral Observations    No unsafe behaviors       Pain     No c/o pain   Pain <3  Assess for and treat pain q shift with prn pain meds   Skin     No skin breakdown  No skin breakdown  Assess skin q shift for any skin breakdown     *See Care Plan and progress notes for long and short-term goals.    Barriers to Discharge:  needs to be Mod/I toileting      Possible Resolutions to Barriers:    daughter caregiver      Discharge Planning/Teaching Needs:    Home with daughter who can provide assist to pt.  Pt feels he is doing well      Team Discussion:    Restless leg-MD working on.  Neuro-psych to see on Monday.  Making good progress.  Will need family education next week with daughter.   Revisions to Treatment Plan:    None  Continued Need for Acute Rehabilitation Level of Care: The patient requires daily medical management by a physician with specialized training in physical medicine and rehabilitation for the following conditions: Daily direction of a multidisciplinary physical rehabilitation program to ensure safe treatment while eliciting the highest outcome that is of practical value to the patient.: Yes Daily medical management of patient stability for increased activity during participation in an intensive rehabilitation regime.: Yes Daily analysis of laboratory values and/or radiology reports with any subsequent need for medication adjustment of medical intervention  for : Neurological problems  [name] 10/03/2013, 1:26 PM          Patient ID: Lillia Pauls, male   DOB: 02-22-1956, 58 y.o.   MRN: [id number]

## 2013-10-04 ENCOUNTER — Inpatient Hospital Stay (HOSPITAL_COMMUNITY): Payer: Medicare Other | Admitting: Physical Therapy

## 2013-10-04 ENCOUNTER — Inpatient Hospital Stay (HOSPITAL_COMMUNITY): Payer: Medicare Other

## 2013-10-04 DIAGNOSIS — I10 Essential (primary) hypertension: Secondary | ICD-10-CM | POA: Diagnosis not present

## 2013-10-04 DIAGNOSIS — I6350 Cerebral infarction due to unspecified occlusion or stenosis of unspecified cerebral artery: Secondary | ICD-10-CM | POA: Diagnosis not present

## 2013-10-04 DIAGNOSIS — F319 Bipolar disorder, unspecified: Secondary | ICD-10-CM | POA: Diagnosis not present

## 2013-10-04 NOTE — Progress Notes (Signed)
Subjective/Complaints: Slept poor restless leg and headache Discussed deficits with speech, no aphasia, mainly dysarthria A 12 point review of systems has been performed and if not noted above is otherwise negative.   Objective: Vital Signs: Blood pressure 162/64, pulse 56, temperature 97.6 F (36.4 C), temperature source Oral, resp. rate 16, weight 90.266 kg (199 lb), SpO2 98.00%. No results found. No results found for this basename: WBC, HGB, HCT, PLT,  in the last 72 hours  Recent Labs  10/03/13 1355  NA 135*  K 4.2  CL 96  GLUCOSE 94  BUN 17  CREATININE 1.11  CALCIUM 8.6   CBG (last 3)   Recent Labs  10/01/13 1149  GLUCAP 132*    Wt Readings from Last 3 Encounters:  10/03/13 90.266 kg (199 lb)  09/28/13 89.812 kg (198 lb)    Physical Exam:  Constitutional: . He appears well-developed and well-nourished.  HENT:  Head: Normocephalic and atraumatic. Ear ring left ear  Eyes: Conjunctivae are normal. Pupils are equal, round, and reactive to light.  Neck: Normal range of motion. Neck supple.  Cardiovascular: Normal rate and regular rhythm. No murmurs  Respiratory: Effort normal and breath sounds normal. No respiratory distress. He has occ wheezes. No rales  GI: Soft. Bowel sounds are normal. He exhibits no distension.  no tenderness.  Musculoskeletal: He exhibits trace edema RUE.[name]  Neurological: exam unchanged: He is alert and oriented to person, place, and time.  Mild dysarthria with occasional word finding difficulties. . Mild facial weakness with minimal tongue deviation. Follows basic commands without difficulty. Right hemiparesis: RUE 0/5 deltoid, bicep,2- tricep,0 HI. RLE 2- HF, 3- KE, 1+ ADF and APF. Sensation appears grossly intact RUE and RLE. DTR's 2+ Right arm and leg. 1+ Left side. Reasonable insight and awareness.  Skin: Skin is warm and dry.  Psych: no lability   Assessment/Plan: 1. Functional deficits secondary to left lacunar CR, BG,  and IC infarcts, left subcortical frontal infarct which require 3+ hours per day of interdisciplinary therapy in a comprehensive inpatient rehab setting. Physiatrist is providing close team supervision and 24 hour management of active medical problems listed below. Physiatrist and rehab team continue to assess barriers to discharge/monitor patient progress toward functional and medical goals. FIM: FIM - Bathing Bathing Steps Patient Completed: Chest;Right Arm;Left Arm;Abdomen;Front perineal area;Buttocks;Right upper leg;Left upper leg;Right lower leg (including foot);Left lower leg (including foot) Bathing: 5: Supervision: Safety issues/verbal cues (lean while seated in shower to wash buttocks)  FIM - Upper Body Dressing/Undressing Upper body dressing/undressing steps patient completed: Thread/unthread right sleeve of pullover shirt/dresss;Thread/unthread left sleeve of pullover shirt/dress;Put head through opening of pull over shirt/dress;Pull shirt over trunk Upper body dressing/undressing: 5: Supervision: Safety issues/verbal cues FIM - Lower Body Dressing/Undressing Lower body dressing/undressing steps patient completed: Don/Doff right sock;Don/Doff left sock;Don/Doff right shoe;Don/Doff left shoe;Thread/unthread right pants leg;Thread/unthread left pants leg;Pull pants up/down Lower body dressing/undressing: 4: Min-Patient completed 75 plus % of tasks  FIM - Toileting Toileting steps completed by patient: Adjust clothing prior to toileting;Adjust clothing after toileting;Performs perineal hygiene Toileting Assistive Devices: Grab bar or rail for support Toileting: 6: Assistive device: No helper  FIM - Radio producer Devices: Grab bars Toilet Transfers: 3-To toilet/BSC: Mod A (lift or lower assist);3-From toilet/BSC: Mod A (lift or lower assist)  FIM - Engineer, site Assistive Devices: Arm rests Bed/Chair Transfer: 4: Chair or W/C >  Bed: Min A (steadying Pt. > 75%);5: Sit >  Supine: Supervision (verbal cues/safety issues)  FIM - Locomotion: Wheelchair Distance: 150 Locomotion: Wheelchair: 1: Total Assistance/staff pushes wheelchair (Pt<25%) FIM - Locomotion: Ambulation Locomotion: Ambulation Assistive Devices: Orthosis;Other (comment);Walker - Rolling (R hand orthosis, R AFO) Ambulation/Gait Assistance: 4: Min assist Locomotion: Ambulation: 1: Travels less than 50 ft with minimal assistance (Pt.>75%)  Comprehension Comprehension Mode: Auditory Comprehension: 6-Follows complex conversation/direction: With extra time/assistive device  Expression Expression Mode: Verbal Expression: 4-Expresses basic 75 - 89% of the time/requires cueing 10 - 24% of the time. Needs helper to occlude trach/needs to repeat words.  Social Interaction Social Interaction: 6-Interacts appropriately with others with medication or extra time (anti-anxiety, antidepressant).  Problem Solving Problem Solving: 6-Solves complex problems: With extra time  Memory Memory: 6-More than reasonable amt of time  Medical Problem List and Plan:  1. DVT Prophylaxis/Anticoagulation: Pharmaceutical: Lovenox  2. Pain Management: prn tylenol.  3. Bipolar disorder/Mood: Continue tegretol, ambien, as well as Ambien for chronic insomnia. Team to provide ego support. LSCW to follow for evaluation and support.  4. Neuropsych: This patient is capable of making decisions on his own behalf.  5. HTN: continued poor control. increase prinizide to 40, continue metoprolol and imdur. Increased HCTZ to 25mg   6. COPD: will add albuterol MDI prn SOB.  7. Hyperglycemia:  check Hgb A1C--check CBG , am value low today, rec hs snack.  8. Dyslipidemia: continue lipitor.  9. Drug/Tobacco abuse: education provided today regarding stroke risks, etc. Education ongoing 10.  RLS trial requip, no help last noc, cont current dose for now , may need to increase LOS (Days) 6 A FACE TO  FACE EVALUATION WAS PERFORMED  Kaitland Lewellyn E 10/04/2013 9:00 AM

## 2013-10-04 NOTE — Progress Notes (Signed)
Speech Language Pathology Daily Session Note  Patient Details  Name: David Hall MRN: [id number] Date of Birth: 1955-12-06  Today's Date: 10/04/2013 Time: 4854-6270 Time Calculation (min): 32 min  Short Term Goals: Week 1: SLP Short Term Goal 1 (Week 1): Pt will uutilize safe swallowing strategies with current diet with Mod I SLP Short Term Goal 2 (Week 1): Pt will utilize speech intelligibility strategies at the conversational level with supervision level verbal cues.  Skilled Therapeutic Interventions: Skilled treatment focused on swallowing and communication goals in group setting. SLP facilitated session with observation of thin liquids via cup sips as well as medication administered by RN, which pt consumed whole, multiple at a time, with thin liquid. Pt consumed trials with Mod I with no overt s/s of aspiration. Pt's speech was intelligible at the conversational level >90% of the time, without cues from SLP to utilize strategies. Pt reported that he was feeling more comfortable utilizing them. Continue plan of care.   FIM:  Comprehension Comprehension Mode: Auditory Comprehension: 6-Follows complex conversation/direction: With extra time/assistive device Expression Expression Mode: Verbal Expression: 5-Expresses basic needs/ideas: With extra time/assistive device Social Interaction Social Interaction: 6-Interacts appropriately with others with medication or extra time (anti-anxiety, antidepressant). Problem Solving Problem Solving: 5-Solves basic problems: With no assist Memory Memory: 6-More than reasonable amt of time FIM - Eating Eating Activity: 6: Swallowing techniques: self-managed  Pain Pain Assessment Pain Assessment: No/denies pain Pain Score: 0-No pain Pain Location: Head Pain Intervention(s): RN made aware Multiple Pain Sites: No  Therapy/Group: Group Therapy   Germain Osgood, M.A. CCC-SLP [phone]  Germain Osgood 10/04/2013, 12:33  PM

## 2013-10-04 NOTE — Progress Notes (Signed)
Occupational Therapy Session Note  Patient Details  Name: David Hall MRN: [id number] Date of Birth: June 21, 1956  Today's Date: 10/04/2013 Time: 0915-1000 Time Calculation (min): 45 min  Short Term Goals: Week 1:  OT Short Term Goal 1 (Week 1): Patient will complete upper body bathing and dressing with setup assist OT Short Term Goal 2 (Week 1): Patient will complete lower body bathing and dressing with mod assist OT Short Term Goal 3 (Week 1): Patient will complete transfer, w/c <> toilet, with min assist OT Short Term Goal 4 (Week 1): Patient will complete seated grooming and hygiene with min assist  Skilled Therapeutic Interventions/Progress Updates:    Pt seen for ADL retraining with focus on positioning of RUE during self-care tasks, dynamic standing balance, and safety awareness. Pt received sitting in w/c requesting to complete bathing at sink this AM. Pt slightly impulsive throughout session requiring min cues for safety awareness. Pt required min cues for positioning of RUE during sit<>stand however demonstrated improved awareness while sitting in w/c. Pt with carryover of technique to wash LUE via rubbing arm on wash cloth and therapist provided [facility] assist to RUE for thoroughness. Completed sit<>stand at sink approx 4x for hygiene and clothing management with min assist. Pt recalled hemi dressing techniques however required assist for shirt on this date as it was a long sleeved shirt. Completed shaving while sitting in w/c with HOH assist to apply shaving cream with LUE. Practiced tub transfer with min assist and discussed discharge plans. Pt reporting he was planning on getting TTB prior to admission here. Plans for family training with daughter next week. At end of session pt left sitting in w/c with all needs in reach and lap tray placed.   Therapy Documentation Precautions:  Precautions Precautions: Fall Precaution Comments: impulsive Restrictions Weight Bearing  Restrictions: No General:   Vital Signs: Therapy Vitals Pulse Rate: 52 BP: 135/61 mmHg Patient Position, if appropriate: Sitting Pain: No report of pain during therapy session.   See FIM for current functional status  Therapy/Group: Individual Therapy  Duayne Cal 10/04/2013, 12:15 PM

## 2013-10-04 NOTE — Progress Notes (Signed)
Physical Therapy Session Note  Patient Details  Name: David Hall MRN: [id number] Date of Birth: 1956-02-16  Today's Date: 10/04/2013 Time: 1119-1202 and 1856-3149 Time Calculation (min): 43 min and 60 min  Short Term Goals: Week 1:  PT Short Term Goal 1 (Week 1): Patient will be able to perform Bed Mobility with min-assist. PT Short Term Goal 2 (Week 1): Patient will be able to perform Transfers with min-assist. PT Short Term Goal 3 (Week 1): Patient will be able to propel manual w/c x 100' on flat surfaces with min-assist. PT Short Term Goal 4 (Week 1): Patient will be able to perform ambulate 30' with Mod-assist x 2 using LRAD.  Skilled Therapeutic Interventions/Progress Updates:    Treatment Session 1: Pt received seated in w/c; agreeable to session. Reports elevated BP this am. Pre-treatment BP WNL; see vitals signs for details. Session focused on increasing stability/independence with gait, functional transfers, and stair negotiation.   Pt performed w/c mobility 2x150' (to/from pt room) in controlled environment with supervision using L hemi technique; no rest breaks required. Donned R AFO ([place] WalkOn) for R ankle dorsiflexion assist. Performed gait x50' in controlled environment with mod A using rolling walker, R hand orthosis; manual facilitation at R axilla and L ribs for upright posture; manual facilitation of bilat weight shifting.   Performed multiple sit<>stand transfers with min A, tactile cueing at R knee to increase RLE weightbearing. Squat pivot transfers from w/c<>mat table with min A, effective between-session carryover of setup and technique. Negotiation of 3 stairs with LUE at L rail, forward-facing with step-to pattern and mod A, mod verbal/tactile cueing for sequencing. No assist required for R knee control; however, pt does exhibit significant  R knee flexion during RLE stance during stair training. Session ended in pt room, where R AFO was doffed and pt was left  seated in w/c with all needs within reach. Overall, pt exhibited improved safety awareness, decreased impulsivity during session.  Treatment Session 2: Pt received seated in w/c; agreeable to PT. Session focused on increasing stability/independence with functional transfers. Pt performed w/c mobility x180' in controlled environment with L hemi technique, supervision, increased time. Car transfer (squat pivot from w/c<>car) with min A, min cueing for safe hand placement, increased time. Performed squat pivot transfers x2 from w/c<>couch (apartment) with min A during initial trial, close supervision during subsequent trial.   Transported pt to gym in w/c, where pt performed multiple squat pivot transfers from w/c<>mat table (increased height with each transfer) with min A to close supervision. After initial transfer, donned R GivMorh sling secondary to protect RUE and control RUE movement during transfers.  Upon returning to pt room, performed squat pivot from w/c>bed with close supervision. Sit>supine with supervision, HOB flat, no rails. Therapist departed with pt semi-reclined in bed with 3 bed rails up, bed alarm on, and all needs within reach.  Therapy Documentation Precautions:  Precautions Precautions: Fall Precaution Comments: impulsive Restrictions Weight Bearing Restrictions: No Vital Signs: Therapy Vitals Pulse Rate: 52 BP: 135/61 mmHg Patient Position, if appropriate: Sitting Pain: Pain Assessment Pain Assessment: No/denies pain Pain Score: 0-No pain  See FIM for current functional status  Therapy/Group: Individual Therapy  Hobble, Malva Cogan 10/04/2013, 12:06 PM

## 2013-10-05 ENCOUNTER — Inpatient Hospital Stay (HOSPITAL_COMMUNITY): Payer: Medicare Other

## 2013-10-05 ENCOUNTER — Inpatient Hospital Stay (HOSPITAL_COMMUNITY): Payer: Medicare Other | Admitting: *Deleted

## 2013-10-05 ENCOUNTER — Inpatient Hospital Stay (HOSPITAL_COMMUNITY): Payer: Medicare Other | Admitting: Physical Therapy

## 2013-10-05 DIAGNOSIS — I10 Essential (primary) hypertension: Secondary | ICD-10-CM | POA: Diagnosis not present

## 2013-10-05 DIAGNOSIS — I6350 Cerebral infarction due to unspecified occlusion or stenosis of unspecified cerebral artery: Secondary | ICD-10-CM | POA: Diagnosis not present

## 2013-10-05 DIAGNOSIS — F319 Bipolar disorder, unspecified: Secondary | ICD-10-CM | POA: Diagnosis not present

## 2013-10-05 LAB — CREATININE, SERUM
Creatinine, Ser: 0.94 mg/dL (ref 0.50–1.35)
GFR calc Af Amer: >90 mL/min (ref >90)
GFR calc non Af Amer: >90 mL/min (ref >90)

## 2013-10-05 MED ORDER — HYDROCHLOROTHIAZIDE 25 MG PO TABS
25.0000 mg | ORAL_TABLET | Freq: Every day | ORAL | Status: DC
  Administered 2013-10-05 – 2013-10-11 (×7): 25 mg via ORAL
  Filled 2013-10-05 (×8): qty 1

## 2013-10-05 NOTE — Progress Notes (Signed)
Speech Language Pathology Daily Session Note  Patient Details  Name: David Hall MRN: [id number] Date of Birth: September 11, 1955  Today's Date: 10/05/2013 Time: 1130-1200 Time Calculation (min): 30 min  Short Term Goals: Week 1: SLP Short Term Goal 1 (Week 1): Pt will uutilize safe swallowing strategies with current diet with Mod I SLP Short Term Goal 2 (Week 1): Pt will utilize speech intelligibility strategies at the conversational level with supervision level verbal cues.  Skilled Therapeutic Interventions: Treatment focused on swallowing and speech goals. SLP facilitated session with observation of lunch meal consisting of regular textures and thin liquids. Pt utilized safe swallowing strategies with Mod I with no overt s/s of aspiration. Pt does not need further SLP intervention for dysphagia at this time. Pt utilized speech intelligibility strategies at the conversational level throughout meal with supervision level verbal cues. Continue plan of care.   FIM:  Comprehension Comprehension Mode: Auditory Comprehension: 6-Follows complex conversation/direction: With extra time/assistive device Expression Expression Mode: Verbal Expression: 5-Expresses basic needs/ideas: With no assist Social Interaction Social Interaction: 6-Interacts appropriately with others with medication or extra time (anti-anxiety, antidepressant). Problem Solving Problem Solving: 5-Solves basic problems: With no assist Memory Memory: 6-More than reasonable amt of time FIM - Eating Eating Activity: 7: Complete independence:no helper;6: Assistive device: dentures  Pain  No/denies pain  Therapy/Group: Individual Therapy   David Hall, M.A. CCC-SLP [phone]  David Hall 10/05/2013, 3:49 PM

## 2013-10-05 NOTE — Progress Notes (Signed)
Occupational Therapy Session Note  Patient Details  Name: David Hall MRN: [id number] Date of Birth: 11/20/1955  Today's Date: 10/05/2013 Time: 4098-1191 Time Calculation (min): 43 min  Short Term Goals: Week 1:  OT Short Term Goal 1 (Week 1): Patient will complete upper body bathing and dressing with setup assist OT Short Term Goal 2 (Week 1): Patient will complete lower body bathing and dressing with mod assist OT Short Term Goal 3 (Week 1): Patient will complete transfer, w/c <> toilet, with min assist OT Short Term Goal 4 (Week 1): Patient will complete seated grooming and hygiene with min assist  Skilled Therapeutic Interventions/Progress Updates:    Pt seen for 1:1 OT session with focus on NMR to RUE and standing balance. Pt received sitting in w/c declining bathing this AM. Engaged in card matching game in standing while weight bearing through [place]. Placed cards to right side to facilitate weight shift through RLE and increase weight bearing. Pt required 2 rest breaks during task. Transitioned to finishing task while sitting EOM and weight bearing through RUE in extension and external rotation then progressed to completing while in side lying on elbow for increased proprioceptive input. In sitting, pt with active shoulder protraction/retraction and extension, and tricep activation. Engaged in UE ranger exercises for shoulder protraction/retraction and abd/adduction with emphasis on slow controlled movement and manual facilitation to prevent compensatory movements with trunk. At end of session pt returned to room and left with all needs in reach. Pt very motivated by progress with UE movement.   Therapy Documentation Precautions:  Precautions Precautions: Fall Precaution Comments: impulsive Restrictions Weight Bearing Restrictions: No General:   Vital Signs:   Pain: Pt with no report of pain during therapy session.   See FIM for current functional status  Therapy/Group:  Individual Therapy  Duayne Cal 10/05/2013, 12:08 PM

## 2013-10-05 NOTE — Progress Notes (Signed)
Physical Therapy Weekly Progress Note  Patient Details  Name: David Hall MRN: [id number] Date of Birth: May 06, 1956  Today's Date: 10/05/2013 Time: 0804-0900 Time Calculation (min): 57 min  Patient has met 4 of 4 short term goals.  Pt demonstrates improved independence with bed mobility, functional transfers, gait, and w/c mobility secondary to ability to compensate for deficits, increased functional use of RLE, improved safety awareness, and increased postural stability.  Patient continues to demonstrate the following deficits: R hemiplegia, decreased postural stability, unbalanced muscle activation, impaired timing and sequencing and therefore will continue to benefit from skilled PT intervention to enhance overall performance with activity tolerance, balance, postural control, ability to compensate for deficits and functional use of  right lower extremity.  Patient progressing toward long term goals..  Plan of care revisions: long term goal addressing car transfer modified secondary to slow pt progress.  PT Short Term Goals Week 1:  PT Short Term Goal 1 (Week 1): Patient will be able to perform Bed Mobility with min-assist. PT Short Term Goal 1 - Progress (Week 1): Met PT Short Term Goal 2 (Week 1): Patient will be able to perform Transfers with min-assist. PT Short Term Goal 2 - Progress (Week 1): Met PT Short Term Goal 3 (Week 1): Patient will be able to propel manual w/c x 100' on flat surfaces with min-assist. PT Short Term Goal 3 - Progress (Week 1): Met PT Short Term Goal 4 (Week 1): Patient will be able to perform ambulate 30' with Mod-assist x 2 using LRAD. PT Short Term Goal 4 - Progress (Week 1): Met Week 2:  PT Short Term Goal 1 (Week 2): STG's=LTG's secondary to ELOS  Skilled Therapeutic Interventions/Progress Updates:     Pt received semi-reclined in bed; agreeable to PT. Session focused on basic transfers, car transfers, and gait training. Supine>sit with supervision,  HOB flat, no rails, min cueing for technique. Seated EOM, donned pants, shirt, R AFO (O[place]ock WalkOn), and R GivMohr sling. Squat pivot from bed<>w/c with min A; no cueing required for transfer setup. Pt performed w/c mobility x200' in controlled environment with L hemi technique and supervision.  Car transfer (stand pivot) x2. Initial transfer with rolling walker and mod A; subsequent transfer without rolling walker requiring min A. Doffed GivMohr sling. Performed gait x25' in controlled environment with rolling walker, R hand orthosis with mod A, manual facilitation at R axilla, L ribcage for upright posture; mod verbal cueing to promote R knee extension during RLE initial contact. Multimodal cueing for R stance stability (focus on hip extension). Transported pt to room via w/c secondary to pt fatigue. Therapist departed room with pt seated in w/c with all needs within reach.  Therapy Documentation Precautions:  Precautions Precautions: Fall Precaution Comments: impulsive Restrictions Weight Bearing Restrictions: No Vital Signs: Therapy Vitals Temp: 97.5 F (36.4 C) Temp src: Oral Pulse Rate: 63 Resp: 18 BP: 172/70 mmHg Patient Position, if appropriate: Sitting Oxygen Therapy SpO2: 100 % O2 Device: None (Room air) Pain: Pain Assessment Pain Score: 6  Pain Type: Acute pain Pain Location: Head Pain Descriptors / Indicators: Aching Pain Onset: Gradual Pain Intervention(s): Medication (See eMAR)  See FIM for current functional status  Therapy/Group: Individual Therapy  Stefano Gaul 10/05/2013, 6:39 PM

## 2013-10-05 NOTE — Progress Notes (Addendum)
Subjective/Complaints: Slept better , less restless leg symptoms A 12 point review of systems has been performed and if not noted above is otherwise negative.   Objective: Vital Signs: Blood pressure 164/79, pulse 57, temperature 97.5 F (36.4 C), temperature source Oral, resp. rate 18, weight 90.266 kg (199 lb), SpO2 97.00%. No results found. No results found for this basename: WBC, HGB, HCT, PLT,  in the last 72 hours  Recent Labs  10/03/13 1355 10/05/13 0458  NA 135*  --   K 4.2  --   CL 96  --   GLUCOSE 94  --   BUN 17  --   CREATININE 1.11 0.94  CALCIUM 8.6  --    CBG (last 3)  No results found for this basename: GLUCAP,  in the last 72 hours  Wt Readings from Last 3 Encounters:  10/03/13 90.266 kg (199 lb)  09/28/13 89.812 kg (198 lb)    Physical Exam:  Constitutional: . He appears well-developed and well-nourished.  HENT:  Head: Normocephalic and atraumatic. Ear ring left ear  Eyes: Conjunctivae are normal. Pupils are equal, round, and reactive to light.  Neck: Normal range of motion. Neck supple.  Cardiovascular: Normal rate and regular rhythm. No murmurs  Respiratory: Effort normal and breath sounds normal. No respiratory distress. He has occ wheezes. No rales  GI: Soft. Bowel sounds are normal. He exhibits no distension.  no tenderness.  Musculoskeletal: He exhibits trace edema RUE.[name]  Neurological: exam unchanged: He is alert and oriented to person, place, and time.  Mild dysarthria with occasional word finding difficulties. . Mild facial weakness with minimal tongue deviation. Follows basic commands without difficulty. Right hemiparesis: RUE 0/5 deltoid, bicep,2- tricep,0 HI. RLE 2- HF, 3- KE, 1+ ADF and APF. Sensation appears grossly intact RUE and RLE. DTR's 2+ Right arm and leg. 1+ Left side. Reasonable insight and awareness.  Skin: Skin is warm and dry.  Psych: no lability   Assessment/Plan: 1. Functional deficits secondary to left lacunar  CR, BG, and IC infarcts, left subcortical frontal infarct which require 3+ hours per day of interdisciplinary therapy in a comprehensive inpatient rehab setting. Physiatrist is providing close team supervision and 24 hour management of active medical problems listed below. Physiatrist and rehab team continue to assess barriers to discharge/monitor patient progress toward functional and medical goals. FIM: FIM - Bathing Bathing Steps Patient Completed: Chest;Abdomen;Front perineal area;Buttocks;Right upper leg;Left upper leg;Right lower leg (including foot);Left lower leg (including foot);Right Arm Bathing: 4: Min-Patient completes 8-9 86f 10 parts or 75+ percent  FIM - Upper Body Dressing/Undressing Upper body dressing/undressing steps patient completed: Thread/unthread left sleeve of pullover shirt/dress;Put head through opening of pull over shirt/dress;Pull shirt over trunk Upper body dressing/undressing: 4: Min-Patient completed 75 plus % of tasks FIM - Lower Body Dressing/Undressing Lower body dressing/undressing steps patient completed: Don/Doff right sock;Don/Doff left sock;Thread/unthread right pants leg;Thread/unthread left pants leg;Pull pants up/down Lower body dressing/undressing: 4: Steadying Assist  FIM - Toileting Toileting steps completed by patient: Adjust clothing prior to toileting;Adjust clothing after toileting;Performs perineal hygiene Toileting Assistive Devices: Grab bar or rail for support Toileting: 6: Assistive device: No helper  FIM - Radio producer Devices: Grab bars Toilet Transfers: 3-To toilet/BSC: Mod A (lift or lower assist);3-From toilet/BSC: Mod A (lift or lower assist)  FIM - Engineer, site Assistive Devices: Arm rests Bed/Chair Transfer: 4: Chair or W/C > Bed: Min A (steadying Pt. > 75%);4: Bed >  Chair or W/C: Min A (steadying Pt. > 75%);5: Sit > Supine: Supervision (verbal cues/safety issues)  FIM -  Locomotion: Wheelchair Distance: 180 Locomotion: Wheelchair: 5: Travels 150 ft or more: maneuvers on rugs and over door sills with supervision, cueing or coaxing FIM - Locomotion: Ambulation Locomotion: Ambulation Assistive Devices: Orthosis;Other (comment);Walker - Rolling (R hand orthosis, R AFO) Ambulation/Gait Assistance: 3: Mod assist Locomotion: Ambulation: 0: Activity did not occur  Comprehension Comprehension Mode: Auditory Comprehension: 6-Follows complex conversation/direction: With extra time/assistive device  Expression Expression Mode: Verbal Expression: 5-Expresses basic needs/ideas: With no assist  Social Interaction Social Interaction: 6-Interacts appropriately with others with medication or extra time (anti-anxiety, antidepressant).  Problem Solving Problem Solving: 5-Solves basic problems: With no assist  Memory Memory: 6-More than reasonable amt of time  Medical Problem List and Plan:  1. DVT Prophylaxis/Anticoagulation: Pharmaceutical: Lovenox  2. Pain Management: prn tylenol.  3. Bipolar disorder/Mood: Continue tegretol, ambien, as well as Ambien for chronic insomnia. Team to provide ego support. LSCW to follow for evaluation and support.  4. Neuropsych: This patient is capable of making decisions on his own behalf.  5. HTN: continued poor control. increase prinizide to 40, continue metoprolol and imdur. Increased HCTZ to 25mg  Monitor 6. COPD: will add albuterol MDI prn SOB.  7. Hyperglycemia:  check Hgb A1C--check CBG , am value low today, rec hs snack.  8. Dyslipidemia: continue lipitor.  9. Drug/Tobacco abuse: education provided today regarding stroke risks, etc. Education ongoing 10.  RLS trial requip, no help last noc, cont current dose for now , may need to increase LOS (Days) 7 A FACE TO FACE EVALUATION WAS PERFORMED  Charlett Blake 10/05/2013 7:16 AM

## 2013-10-05 NOTE — Progress Notes (Signed)
Occupational Therapy Note  Patient Details  Name: David Hall MRN: [id number] Date of Birth: 1956-05-22 Today's Date: 10/05/2013  Time:  1515-1615 (60 min) Pain: headache 7/10 Individual session  Engaged in [place], transfers, sit to stand, standing balance.  BP sitting=  168/87.  Transferred wc to bed with min assist.  Did RUE weight bearing with sit to stand (half stand).  Attempted half standing balance with weight on right UE and LE with pt lifting LLE.  Did stabilizing stool with RUE control. Addressed static holding big ball with right hand and max assist.  Transferred to right going to wc with minimal assist (squat pivot).  Pt propelled self to room and left with call bell in reach.   Lisa Roca 10/05/2013, 4:12 PM

## 2013-10-06 ENCOUNTER — Inpatient Hospital Stay (HOSPITAL_COMMUNITY): Payer: Medicare Other | Admitting: *Deleted

## 2013-10-06 ENCOUNTER — Inpatient Hospital Stay (HOSPITAL_COMMUNITY): Payer: Medicare Other | Admitting: Speech Pathology

## 2013-10-06 DIAGNOSIS — F329 Major depressive disorder, single episode, unspecified: Secondary | ICD-10-CM | POA: Diagnosis not present

## 2013-10-06 DIAGNOSIS — F3289 Other specified depressive episodes: Secondary | ICD-10-CM | POA: Diagnosis not present

## 2013-10-06 DIAGNOSIS — I10 Essential (primary) hypertension: Secondary | ICD-10-CM | POA: Diagnosis not present

## 2013-10-06 DIAGNOSIS — I6350 Cerebral infarction due to unspecified occlusion or stenosis of unspecified cerebral artery: Secondary | ICD-10-CM | POA: Diagnosis not present

## 2013-10-06 NOTE — Plan of Care (Signed)
Problem: RH PAIN MANAGEMENT Goal: RH STG PAIN MANAGED AT OR BELOW PT'S PAIN GOAL Pain level will be less than 3 on a scale of 0-10  Outcome: Not Progressing On going complaints of headache.

## 2013-10-06 NOTE — Progress Notes (Signed)
Speech Language Pathology Daily Session Note  Patient Details  Name: DREVON PLOG MRN: [id number] Date of Birth: [birth year]  Today's Date: 10/06/2013 Time: 2563-8937 Time Calculation (min): 30 min  Short Term Goals: Week 1: SLP Short Term Goal 1 (Week 1): Pt will uutilize safe swallowing strategies with current diet with Mod I SLP Short Term Goal 2 (Week 1): Pt will utilize speech intelligibility strategies at the conversational level with supervision level verbal cues.  Skilled Therapeutic Interventions: Therapeutic intervention complete with short term goals addressed. The patient required min verbal cues to use speech intelligibility strategies during conversational speech.  Continue with current treatment plan.    FIM:  Comprehension Comprehension Mode: Auditory Comprehension: 5-Follows basic conversation/direction: With extra time/assistive device Expression Expression Mode: Verbal Expression: 5-Expresses basic needs/ideas: With no assist  Pain Pain Assessment Pain Assessment: No/denies pain Pain Score: 7  Pain Type: Acute pain Pain Location: Head Pain Descriptors / Indicators: Headache Pain Frequency: Intermittent Pain Onset: On-going Patients Stated Pain Goal: 2 Pain Intervention(s): Medication (See eMAR)  Therapy/Group: Individual Therapy  David Hall 10/06/2013, 5:10 PM

## 2013-10-06 NOTE — Progress Notes (Signed)
David Hall is a 58 y.o. male 07/19/1956 [id number]  Subjective: No new complaints. No new problems. Slept well. Feeling OK.  Objective: Vital signs in last 24 hours: Temp:  [97.5 F (36.4 C)-97.8 F (36.6 C)] 97.8 F (36.6 C) (02/28 2778) Pulse Rate:  [60-63] 60 (02/28 0638) Resp:  [18-20] 20 (02/28 2423) BP: (154-199)/(70-87) 197/82 mmHg (02/28 0638) SpO2:  [100 %] 100 % (02/28 5361) Weight change:  Last BM Date: 10/04/13  Intake/Output from previous day: 02/27 0701 - 02/28 0700 In: 1680 [P.O.:1680] Out: 2500 [Urine:2500] Last cbgs: CBG (last 3)  No results found for this basename: GLUCAP,  in the last 72 hours   Physical Exam General: No apparent distress   HEENT: not dry Lungs: Normal effort. Lungs clear to auscultation, no crackles or wheezes. Cardiovascular: Regular rate and rhythm, no edema Abdomen: S/NT/ND; BS(+) Musculoskeletal:  unchanged Neurological: No new neurological deficits Wounds: N/A    Skin: clear  Aging changes Mental state: Alert, oriented, cooperative    Lab Results: BMET    Component Value Date/Time   NA 135* 10/03/2013 1355   K 4.2 10/03/2013 1355   CL 96 10/03/2013 1355   CO2 25 10/03/2013 1355   GLUCOSE 94 10/03/2013 1355   BUN 17 10/03/2013 1355   CREATININE 0.94 10/05/2013 0458   CALCIUM 8.6 10/03/2013 1355   GFRNONAA >90 10/05/2013 0458   GFRAA >90 10/05/2013 0458   CBC    Component Value Date/Time   WBC 7.9 10/01/2013 0615   RBC 4.21* 10/01/2013 0615   HGB 12.4* 10/01/2013 0615   HCT 36.9* 10/01/2013 0615   PLT 210 10/01/2013 0615   MCV 87.6 10/01/2013 0615   MCH 29.5 10/01/2013 0615   MCHC 33.6 10/01/2013 0615   RDW 14.5 10/01/2013 0615   LYMPHSABS 2.1 10/01/2013 0615   MONOABS 0.9 10/01/2013 0615   EOSABS 0.4 10/01/2013 0615   BASOSABS 0.0 10/01/2013 0615    Studies/Results: No results found.  Medications: I have reviewed the patient's current medications.  Assessment/Plan:  1. DVT Prophylaxis/Anticoagulation:  Pharmaceutical: Lovenox  2. Pain Management: prn tylenol.  3. Bipolar disorder/Mood: Continue tegretol, ambien, as well as Ambien for chronic insomnia. Team to provide ego support. LSCW to follow for evaluation and support.  4. Neuropsych: This patient is capable of making decisions on his own behalf.  5. HTN: continued poor control. increase prinizide to 40, continue metoprolol and imdur. Increased HCTZ to 25mg  Monitor  6. COPD: will add albuterol MDI prn SOB.  7. Hyperglycemia: check Hgb A1C--check CBG , am value low today, rec hs snack.  8. Dyslipidemia: continue lipitor.  9. Drug/Tobacco abuse: education provided today regarding stroke risks, etc. Education ongoing  10. RLS trial requip, no help last noc, cont current dose for now , may need to increase  Cont current Rx     Length of stay, days: 8  Walker Kehr , MD 10/06/2013, 10:52 AM

## 2013-10-06 NOTE — Progress Notes (Signed)
Occupational Therapy Note  Patient Details  Name: David Hall MRN: [id number] Date of Birth: 10-30-1955 Today's Date: 10/06/2013  Time:1330-1400 (30 min) Pain:  none Individual session   Engaged in EOB activities with focus on sit to half stand with both hands on yoga blocks, transferred to mat with minimal assist, and weight bearing on RUE.  Pt had minimal Right arm activation with pushing down, but more with pushing outward.  Returned to room and postioned in wc with call bell and phone in reach.     Lisa Roca 10/06/2013, 1:55 PM

## 2013-10-07 DIAGNOSIS — F3289 Other specified depressive episodes: Secondary | ICD-10-CM | POA: Diagnosis not present

## 2013-10-07 DIAGNOSIS — I10 Essential (primary) hypertension: Secondary | ICD-10-CM | POA: Diagnosis not present

## 2013-10-07 DIAGNOSIS — I6350 Cerebral infarction due to unspecified occlusion or stenosis of unspecified cerebral artery: Secondary | ICD-10-CM | POA: Diagnosis not present

## 2013-10-07 DIAGNOSIS — F329 Major depressive disorder, single episode, unspecified: Secondary | ICD-10-CM | POA: Diagnosis not present

## 2013-10-07 NOTE — Plan of Care (Signed)
Problem: RH PAIN MANAGEMENT Goal: RH STG PAIN MANAGED AT OR BELOW PT'S PAIN GOAL Pain level will be less than 3 on a scale of 0-10  Outcome: Not Progressing Pt complains of 7/10 pain even with tylenol and ultram

## 2013-10-07 NOTE — Progress Notes (Signed)
David Hall is a 58 y.o. male 08-23-1955 [id number]  Subjective: No new complaints. No new problems. Slept well. Feeling OK.  Objective: Vital signs in last 24 hours: Temp:  [98.4 F (36.9 C)-98.7 F (37.1 C)] 98.7 F (37.1 C) (03/01 1500) Pulse Rate:  [61-74] 74 (03/01 1500) Resp:  [16-18] 18 (03/01 1500) BP: (148-183)/(68-83) 175/83 mmHg (03/01 1500) SpO2:  [96 %-100 %] 100 % (03/01 1500) Weight change:  Last BM Date: 10/06/13  Intake/Output from previous day: 02/28 0701 - 03/01 0700 In: 960 [P.O.:960] Out: 1075 [Urine:1075] Last cbgs: CBG (last 3)  No results found for this basename: GLUCAP,  in the last 72 hours   Physical Exam General: No apparent distress   HEENT: not dry Lungs: Normal effort. Lungs clear to auscultation, no crackles or wheezes. Cardiovascular: Regular rate and rhythm, no edema Abdomen: S/NT/ND; BS(+) Musculoskeletal:  unchanged Neurological: No new neurological deficits Wounds: N/A    Skin: clear  Aging changes Mental state: Alert, oriented, cooperative    Lab Results: BMET    Component Value Date/Time   NA 135* 10/03/2013 1355   K 4.2 10/03/2013 1355   CL 96 10/03/2013 1355   CO2 25 10/03/2013 1355   GLUCOSE 94 10/03/2013 1355   BUN 17 10/03/2013 1355   CREATININE 0.94 10/05/2013 0458   CALCIUM 8.6 10/03/2013 1355   GFRNONAA >90 10/05/2013 0458   GFRAA >90 10/05/2013 0458   CBC    Component Value Date/Time   WBC 7.9 10/01/2013 0615   RBC 4.21* 10/01/2013 0615   HGB 12.4* 10/01/2013 0615   HCT 36.9* 10/01/2013 0615   PLT 210 10/01/2013 0615   MCV 87.6 10/01/2013 0615   MCH 29.5 10/01/2013 0615   MCHC 33.6 10/01/2013 0615   RDW 14.5 10/01/2013 0615   LYMPHSABS 2.1 10/01/2013 0615   MONOABS 0.9 10/01/2013 0615   EOSABS 0.4 10/01/2013 0615   BASOSABS 0.0 10/01/2013 0615    Studies/Results: No results found.  Medications: I have reviewed the patient's current medications.  Assessment/Plan:  1. DVT Prophylaxis/Anticoagulation:  Pharmaceutical: Lovenox  2. Pain Management: prn tylenol.  3. Bipolar disorder/Mood: Continue tegretol, ambien, as well as Ambien for chronic insomnia. Team to provide ego support. LSCW to follow for evaluation and support.  4. Neuropsych: This patient is capable of making decisions on his own behalf.  5. HTN: continued poor control. increase prinizide to 40, continue metoprolol and imdur. Increased HCTZ to 25mg  Monitor  6. COPD: will add albuterol MDI prn SOB.  7. Hyperglycemia: check Hgb A1C--check CBG , am value low today, rec hs snack.  8. Dyslipidemia: continue lipitor.  9. Drug/Tobacco abuse: education provided today regarding stroke risks, etc. Education ongoing  10. RLS trial requip, no help last noc, cont current dose for now , may need to increase   Cont current Rx     Length of stay, days: [place] , MD 10/07/2013, 3:34 PM

## 2013-10-07 NOTE — Plan of Care (Signed)
Problem: RH PAIN MANAGEMENT Goal: RH STG PAIN MANAGED AT OR BELOW PT'S PAIN GOAL Pain level will be less than 3 on a scale of 0-10  Outcome: Not Progressing Continues to complain of headache 6/10

## 2013-10-08 ENCOUNTER — Encounter (HOSPITAL_COMMUNITY): Payer: Medicare Other

## 2013-10-08 ENCOUNTER — Inpatient Hospital Stay (HOSPITAL_COMMUNITY): Payer: Medicare Other

## 2013-10-08 ENCOUNTER — Inpatient Hospital Stay (HOSPITAL_COMMUNITY): Payer: Medicare Other | Admitting: Physical Therapy

## 2013-10-08 DIAGNOSIS — I6350 Cerebral infarction due to unspecified occlusion or stenosis of unspecified cerebral artery: Secondary | ICD-10-CM | POA: Diagnosis not present

## 2013-10-08 DIAGNOSIS — F319 Bipolar disorder, unspecified: Secondary | ICD-10-CM | POA: Diagnosis not present

## 2013-10-08 DIAGNOSIS — R911 Solitary pulmonary nodule: Secondary | ICD-10-CM | POA: Diagnosis not present

## 2013-10-08 DIAGNOSIS — I10 Essential (primary) hypertension: Secondary | ICD-10-CM | POA: Diagnosis not present

## 2013-10-08 LAB — BASIC METABOLIC PANEL
BUN: 14 mg/dL (ref 6–23)
CO2: 25 mEq/L (ref 19–32)
Calcium: 8.8 mg/dL (ref 8.4–10.5)
Chloride: 94 mEq/L — ABNORMAL LOW (ref 96–112)
Creatinine, Ser: 1.2 mg/dL (ref 0.50–1.35)
GFR calc Af Amer: 76 mL/min — ABNORMAL LOW (ref >90)
GFR calc non Af Amer: 65 mL/min — ABNORMAL LOW (ref >90)
Glucose, Bld: 79 mg/dL (ref 70–99)
Potassium: 4.4 mEq/L (ref 3.7–5.3)
Sodium: 133 mEq/L — ABNORMAL LOW (ref 137–147)

## 2013-10-08 LAB — CBC
HCT: 33 % — ABNORMAL LOW (ref 39.0–52.0)
Hemoglobin: 11.3 g/dL — ABNORMAL LOW (ref 13.0–17.0)
MCH: 30.4 pg (ref 26.0–34.0)
MCHC: 34.2 g/dL (ref 30.0–36.0)
MCV: 88.7 fL (ref 78.0–100.0)
Platelets: 283 10*3/uL (ref 150–400)
RBC: 3.72 MIL/uL — ABNORMAL LOW (ref 4.22–5.81)
RDW: 14.1 % (ref 11.5–15.5)
WBC: 17.2 10*3/uL — ABNORMAL HIGH (ref 4.0–10.5)

## 2013-10-08 MED ORDER — FLUTICASONE PROPIONATE HFA 44 MCG/ACT IN AERO
1.0000 | INHALATION_SPRAY | Freq: Two times a day (BID) | RESPIRATORY_TRACT | Status: DC
  Administered 2013-10-08 – 2013-10-11 (×4): 1 via RESPIRATORY_TRACT
  Filled 2013-10-08 (×2): qty 10.6

## 2013-10-08 MED ORDER — IPRATROPIUM-ALBUTEROL 20-100 MCG/ACT IN AERS
1.0000 | INHALATION_SPRAY | Freq: Four times a day (QID) | RESPIRATORY_TRACT | Status: DC
Start: 2013-10-08 — End: 2013-10-08

## 2013-10-08 MED ORDER — IPRATROPIUM-ALBUTEROL 0.5-2.5 (3) MG/3ML IN SOLN
3.0000 mL | Freq: Four times a day (QID) | RESPIRATORY_TRACT | Status: DC
  Administered 2013-10-08 (×2): 3 mL via RESPIRATORY_TRACT
  Filled 2013-10-08: qty 3

## 2013-10-08 MED ORDER — ISOSORBIDE MONONITRATE ER 60 MG PO TB24
60.0000 mg | ORAL_TABLET | Freq: Every day | ORAL | Status: DC
  Administered 2013-10-08 – 2013-10-11 (×4): 60 mg via ORAL
  Filled 2013-10-08 (×5): qty 1

## 2013-10-08 NOTE — Progress Notes (Signed)
Patient reports some SOB last pm that resolved with nebulizer treatment. Feels better this morning and no change in chronic intermittent cough. Lungs with rhonchi and occasional wheeze. No fevers but noted to have lower BP today.  PT reports patient with malaise question due to BP issues.   Will check lytes to rule out dehydration. Will also check CXR to rule out acute process. Patient with COPD and was supposed to start an "inhaler". Called his pharmacy to check on meds--'Qvar" ordered. Will add steroid inhaler as well as Combivent and monitor.

## 2013-10-08 NOTE — Progress Notes (Signed)
Occupational Therapy Weekly Progress Note  Patient Details  Name: David Hall MRN: [id number] Date of Birth: March 25, 1956  Today's Date: 10/08/2013 Time: 0830-0930 Time Calculation (min): 60 min  Patient has met 3 of 4 short term goals.  Patient has progressed well in rehab this past week. He has improved his transfers to [facility] supervision and minimal cueing for positioning of RUE and RLE. Patient requires min assist for standing balance as he demonstrates posterior lean at times. Patient demonstrates good carryover of hemi dressing technique and has improved to setup assist for UB dressing.  Patient continues to demonstrate the following deficits: R hemiplegia, decreased standing balance, decreased functional use of RUE/RLE, decreased coordination, safety awareness, decreased postural control in standing, decreased balance reactions and therefore will continue to benefit from skilled OT intervention to enhance overall performance with BADLs, ability to compensate for deficits, safety awareness, standing balance, functional use of RUE.  Patient progressing toward long term goals..  Continue plan of care.  OT Short Term Goals Week 1:  OT Short Term Goal 1 (Week 1): Patient will complete upper body bathing and dressing with setup assist OT Short Term Goal 1 - Progress (Week 1): Partly met OT Short Term Goal 2 (Week 1): Patient will complete lower body bathing and dressing with mod assist OT Short Term Goal 2 - Progress (Week 1): Met OT Short Term Goal 3 (Week 1): Patient will complete transfer, w/c <> toilet, with min assist OT Short Term Goal 3 - Progress (Week 1): Met OT Short Term Goal 4 (Week 1): Patient will complete seated grooming and hygiene with min assist OT Short Term Goal 4 - Progress (Week 1): Met Week 2:  OT Short Term Goal 1 (Week 2): Focus on LTGs   Skilled Therapeutic Interventions/Progress Updates:  Pt seen for ADL retraining with focus on functional transfers,  standing balance, hemi dressing techniques, and safety awareness. Pt received supine in bed and agreeable to shower. Pt required min assist for sit<>stand throughout session d/t posterior lean. Pt utilizes grab bars to assist in shower and reports having grab bars of same placement in shower at home. Pt required min cues for awareness of RUE and RLE prior to transfers and sit<>stand. Provided HOH assist to wash LUE using RUE. Pt with min cues for use of crossover technique for LB dressing and recalled hemi dressing technique. Practiced toilet transfer using grab bar to assist with balance and min cues to push up from w/c arm rest rather then pulling self up. Pt initially min assist and progressed to close supervision for stand pivot transfer. At end of session pt completed shaving task from w/c at sink. Pt required min assist for brushing hair and reports he may cut his hair to allow him to be able to care for it himself. Pt left sitting in w/c with all needs in reach.   Therapy Documentation Precautions:  Precautions Precautions: Fall Precaution Comments: impulsive Restrictions Weight Bearing Restrictions: No General:   Vital Signs: Therapy Vitals Pulse Rate: 55 BP: 113/56 mmHg Patient Position, if appropriate: Sitting Oxygen Therapy SpO2: 97 % O2 Device: None (Room air) Pain: Pain Assessment Pain Assessment: 0-10 Pain Score: 8  Pain Type: Acute pain Pain Location: Head Pain Orientation: Upper Pain Descriptors / Indicators: Headache Pain Onset: On-going Pain Intervention(s): Medication (See eMAR) Multiple Pain Sites: No Other Treatments:    See FIM for current functional status  Therapy/Group: Individual Therapy  Duayne Cal 10/08/2013, 11:48 AM

## 2013-10-08 NOTE — Progress Notes (Signed)
Speech Language Pathology Weekly Progress and Session Notes  Patient Details  Name: David Hall MRN: [id number] Date of Birth: 06-May-1956  Today's Date: 10/08/2013 Time: 3149-7026 Time Calculation (min): 28 min  Short Term Goals: Week 1: SLP Short Term Goal 1 (Week 1): Pt will uutilize safe swallowing strategies with current diet with Mod I SLP Short Term Goal 1 - Progress (Week 1): Met SLP Short Term Goal 2 (Week 1): Pt will utilize speech intelligibility strategies at the conversational level with supervision level verbal cues. SLP Short Term Goal 2 - Progress (Week 1): Met    New Short Term Goals: Week 2: SLP Short Term Goal 1 (Week 2): Pt will utilize speech intelligibility strategies at the conversational level with Mod I  Weekly Progress Updates: Pt has met 2 out of 2 STGs during this reporting period. He is consuming a regular diet and thin liquids with Mod I, and no longer requires f/u for dysphagia. Pt is utilizing his speech intelligibility strategies at the sentence-conversational level with supervision level cueing. Education is ongoing. Pt will benefit from continued SLP services to maximize functional communication.   Intensity: Minumum of 1-2 x/day, 30 to 90 minutes Frequency: 5 out of 7 days Duration/Length of Stay: 14-18 days Treatment/Interventions: Cueing hierarchy;Functional tasks;Patient/family education;Speech/Language facilitation;Therapeutic Activities   Daily Session Skilled Therapeutic Interventions: Treatment focused on speech goals. SLP facilitated session with supervision level verbal cues to utilize speech intelligibility strategies throughout structured task. Pt was able to communicate his wants/needs throughout session with Mod I. Pt continued to utilize speech strategies with introduction of background noise with reminder x1 to increase vocal intensity.       FIM:  Comprehension Comprehension Mode: Auditory Comprehension: 6-Follows complex  conversation/direction: With extra time/assistive device Expression Expression Mode: Verbal Expression: 5-Expresses complex 90% of the time/cues < 10% of the time Social Interaction Social Interaction: 5-Interacts appropriately 90% of the time - Needs monitoring or encouragement for participation or interaction. Problem Solving Problem Solving: 5-Solves complex 90% of the time/cues < 10% of the time Memory Memory: 6-More than reasonable amt of time General    Pain Pain Assessment Pain Assessment: 0-10 Pain Score: Asleep Pain Type: Acute pain Pain Location: Head Pain Descriptors / Indicators: Headache Pain Frequency: Intermittent Pain Onset: On-going Patients Stated Pain Goal: 3 Pain Intervention(s): Medication (See eMAR);Repositioned;Emotional support Multiple Pain Sites: No  Therapy/Group: Individual Therapy   Germain Osgood, M.A. CCC-SLP [phone]  Germain Osgood 10/08/2013, 4:32 PM

## 2013-10-08 NOTE — Progress Notes (Signed)
Occupational Therapy Note  Patient Details  Name: David Hall MRN: [id number] Date of Birth: [birth year] Today's Date: 10/08/2013  Patient missing 30 min skilled OT intervention secondary to patient reporting "not feeling good" and "feeling drunk." BP 115/62 with HR 67 and RN notified. Respiratory therapist entered and pt requesting to complete breathing treatment at this time.   Marlaina Coburn N 10/08/2013, 1:21 PM

## 2013-10-08 NOTE — Progress Notes (Signed)
Subjective/Complaints: Slept better , no restless leg symptoms A 12 point review of systems has been performed and if not noted above is otherwise negative.   Objective: Vital Signs: Blood pressure 186/69, pulse 64, temperature 97.4 F (36.3 C), temperature source Oral, resp. rate 18, weight 90.266 kg (199 lb), SpO2 97.00%. No results found. No results found for this basename: WBC, HGB, HCT, PLT,  in the last 72 hours No results found for this basename: NA, K, CL, CO, GLUCOSE, BUN, CREATININE, CALCIUM,  in the last 72 hours CBG (last 3)  No results found for this basename: GLUCAP,  in the last 72 hours  Wt Readings from Last 3 Encounters:  10/03/13 90.266 kg (199 lb)  09/28/13 89.812 kg (198 lb)    Physical Exam:  Constitutional: . He appears well-developed and well-nourished.  HENT:  Head: Normocephalic and atraumatic. Ear ring left ear  Eyes: Conjunctivae are normal. Pupils are equal, round, and reactive to light.  Neck: Normal range of motion. Neck supple.  Cardiovascular: Normal rate and regular rhythm. No murmurs  Respiratory: Effort normal and breath sounds normal. No respiratory distress. He has occ wheezes. No rales  GI: Soft. Bowel sounds are normal. He exhibits no distension.  no tenderness.  Musculoskeletal: He exhibits trace edema RUE.[name]  Neurological: exam unchanged: He is alert and oriented to person, place, and time.  Mild dysarthria with occasional word finding difficulties. . Mild facial weakness with minimal tongue deviation. Follows basic commands without difficulty. Right hemiparesis: RUE 0/5 deltoid, bicep,2- tricep,0 HI. RLE 2- HF, 3- KE, 1+ ADF and APF. Sensation appears grossly intact RUE and RLE. DTR's 2+ Right arm and leg. 1+ Left side. Reasonable insight and awareness.  Skin: Skin is warm and dry.  Psych: no lability   Assessment/Plan: 1. Functional deficits secondary to left lacunar CR, BG, and IC infarcts, left subcortical frontal infarct  which require 3+ hours per day of interdisciplinary therapy in a comprehensive inpatient rehab setting. Physiatrist is providing close team supervision and 24 hour management of active medical problems listed below. Physiatrist and rehab team continue to assess barriers to discharge/monitor patient progress toward functional and medical goals. FIM: FIM - Bathing Bathing Steps Patient Completed: Chest;Abdomen;Front perineal area;Buttocks;Right upper leg;Left upper leg;Right lower leg (including foot);Left lower leg (including foot);Right Arm Bathing: 4: Min-Patient completes 8-9 20f 10 parts or 75+ percent  FIM - Upper Body Dressing/Undressing Upper body dressing/undressing steps patient completed: Thread/unthread left sleeve of pullover shirt/dress;Put head through opening of pull over shirt/dress;Pull shirt over trunk Upper body dressing/undressing: 4: Min-Patient completed 75 plus % of tasks FIM - Lower Body Dressing/Undressing Lower body dressing/undressing steps patient completed: Don/Doff right sock;Don/Doff left sock;Thread/unthread right pants leg;Thread/unthread left pants leg;Pull pants up/down Lower body dressing/undressing: 4: Steadying Assist  FIM - Toileting Toileting steps completed by patient: Adjust clothing prior to toileting;Performs perineal hygiene;Adjust clothing after toileting Toileting Assistive Devices: Grab bar or rail for support Toileting: 6: Assistive device: No helper  FIM - Radio producer Devices: Grab bars Toilet Transfers: 4-From toilet/BSC: Min A (steadying Pt. > 75%);4-To toilet/BSC: Min A (steadying Pt. > 75%)  FIM - Bed/Chair Transfer Bed/Chair Transfer Assistive Devices: Arm rests Bed/Chair Transfer: 4: Bed > Chair or W/C: Min A (steadying Pt. > 75%);4: Chair or W/C > Bed: Min A (steadying Pt. > 75%)  FIM - Locomotion: Wheelchair Distance: 200 Locomotion: Wheelchair: 5: Travels 150 ft or more: maneuvers on rugs and over  door sills with supervision, cueing or coaxing FIM - Locomotion: Ambulation Locomotion: Ambulation Assistive Devices: Orthosis;Other (comment);Walker - Rolling (R hand orthosis, R AFO) Ambulation/Gait Assistance: 4: Min assist Locomotion: Ambulation: 1: Travels less than 50 ft with minimal assistance (Pt.>75%)  Comprehension Comprehension Mode: Auditory Comprehension: 6-Follows complex conversation/direction: With extra time/assistive device  Expression Expression Mode: Verbal Expression: 5-Expresses complex 90% of the time/cues < 10% of the time  Social Interaction Social Interaction: 5-Interacts appropriately 90% of the time - Needs monitoring or encouragement for participation or interaction.  Problem Solving Problem Solving: 5-Solves complex 90% of the time/cues < 10% of the time  Memory Memory: 6-More than reasonable amt of time  Medical Problem List and Plan:  1. DVT Prophylaxis/Anticoagulation: Pharmaceutical: Lovenox  2. Pain Management: prn tylenol.  3. Bipolar disorder/Mood: Continue tegretol, ambien, as well as Ambien for chronic insomnia. Team to provide ego support. LSCW to follow for evaluation and support.  4. Neuropsych: This patient is capable of making decisions on his own behalf.  5. HTN: continued poor control. increase prinizide to 40, continue metoprolol and imdur. Increased HCTZ to 25mg  titrate meds 6. COPD: will add albuterol MDI prn SOB.  7. Hyperglycemia:  check Hgb A1C--check CBG , am value low today, rec hs snack.  8. Dyslipidemia: continue lipitor.  9. Drug/Tobacco abuse: education provided today regarding stroke risks, etc. Education ongoing 10.  RLS trial requip, no help last noc, cont current dose for now , may need to increase LOS (Days) 10 A FACE TO FACE EVALUATION WAS PERFORMED  Charlett Blake 10/08/2013 6:53 AM

## 2013-10-08 NOTE — Progress Notes (Signed)
Physical Therapy Session Note  Patient Details  Name: David Hall MRN: [id number] Date of Birth: 1956/01/15  Today's Date: 10/08/2013 Time: 0459-9774 Time Calculation (min): 69 min  Short Term Goals: Week 2:  PT Short Term Goal 1 (Week 2): STG's=LTG's secondary to ELOS  Skilled Therapeutic Interventions/Progress Updates:    Pt received seated in w/c; agreeable to session. Performed w/c mobility x215' total in controlled and home environments using L hemi technique with supervision, increased time. Donned R AFO (WalkOn) and shoes. Performed gait x20' in controlled environment with rolling walker, R hand orthosis requiring mod A, multimodal cueing for RLE stance stability (focus on hip/knee extension). Doffed R AFO, per pt report of AFO feeling "different," since last session. Performed gait x15' in controlled environment with LUE support at hallway hand rail, mod A, and manual facilitation at R axilla, L rib cage for postural normalization.   Pt requires increased assistance, time with functional mobility during current session as compared with previous sessions. When cued, pt states feeling "really tired" and "not quite right" today. Seated vitals reveal BP markedly lower than in past sessions; see vital signs for detailed readings. PA and RN notified of pt lethargy, vital signs.  Remainder of session focused on functional transfers. Pt performed multiple squat pivot transfers from mat table<>w/c<>couch (rehab apartment) with min guard-min A. Pt able to perform >80% of transfer setup without cueing; requires cues to decrease distance between w/c and mat table. Returned to pt room, where pt was left seated in w/c with all needs within reach.  Therapy Documentation Precautions:  Precautions Precautions: Fall Precaution Comments: impulsive Restrictions Weight Bearing Restrictions: No Vital Signs: Therapy Vitals Pulse Rate: 56 BP: 120/63 mmHg Patient Position, if appropriate:  Sitting Oxygen Therapy SpO2: 96 % O2 Device: None (Room air) Pain: Pain Assessment Pain Assessment: 0-10 Pain Score: 8  Pain Type: Acute pain Pain Location: Head Pain Orientation: Upper Pain Descriptors / Indicators: Headache Pain Onset: On-going Pain Intervention(s): Medication (See eMAR) Multiple Pain Sites: No  See FIM for current functional status  Therapy/Group: Individual Therapy  Hobble, Malva Cogan 10/08/2013, 5:42 PM

## 2013-10-09 ENCOUNTER — Inpatient Hospital Stay (HOSPITAL_COMMUNITY): Payer: Medicare Other | Admitting: *Deleted

## 2013-10-09 ENCOUNTER — Inpatient Hospital Stay (HOSPITAL_COMMUNITY): Payer: Medicare Other

## 2013-10-09 ENCOUNTER — Inpatient Hospital Stay (HOSPITAL_COMMUNITY): Payer: Medicare Other | Admitting: Physical Therapy

## 2013-10-09 DIAGNOSIS — F319 Bipolar disorder, unspecified: Secondary | ICD-10-CM | POA: Diagnosis not present

## 2013-10-09 DIAGNOSIS — I6350 Cerebral infarction due to unspecified occlusion or stenosis of unspecified cerebral artery: Secondary | ICD-10-CM | POA: Diagnosis not present

## 2013-10-09 DIAGNOSIS — I10 Essential (primary) hypertension: Secondary | ICD-10-CM | POA: Diagnosis not present

## 2013-10-09 LAB — URINALYSIS, ROUTINE W REFLEX MICROSCOPIC
Bilirubin Urine: NEGATIVE
Glucose, UA: NEGATIVE mg/dL
Hgb urine dipstick: NEGATIVE
Ketones, ur: NEGATIVE mg/dL
Leukocytes, UA: NEGATIVE
Nitrite: NEGATIVE
Protein, ur: NEGATIVE mg/dL
Specific Gravity, Urine: 1.016 (ref 1.005–1.030)
Urobilinogen, UA: 0.2 mg/dL (ref 0.0–1.0)
pH: 6.5 (ref 5.0–8.0)

## 2013-10-09 MED ORDER — LEVOFLOXACIN 500 MG PO TABS
500.0000 mg | ORAL_TABLET | Freq: Every day | ORAL | Status: DC
  Administered 2013-10-09 – 2013-10-11 (×3): 500 mg via ORAL
  Filled 2013-10-09 (×4): qty 1

## 2013-10-09 MED ORDER — LEVOFLOXACIN 750 MG PO TABS: 750.0000 mg | ORAL_TABLET | Freq: Every day | ORAL | Status: DC

## 2013-10-09 NOTE — Consult Note (Signed)
INITIAL DIAGNOSTIC EVALUATION - [facility]   MEDICAL NECESSITY:  Mr. Aeric Burnham was seen on the [facility] for an initial diagnostic evaluation owing to the patient's diagnosis of stroke.   According to medical records, Mr. Burch was admitted to the rehab unit owing to "functional deficits secondary to left lacunar CR, BG, and IC infarcts, left subcortical anterior frontal infarct." He has a history of HTN, bipolar disorder, and crack cocaine use. He was admitted on 09/26/13 with right sided weakness and slurred speech. Patient has a history of medical non-compliance and left AMA. UDS was positive for cocaine and benzodiazepines. MRI brain with acute lacunar infarct left corona radiata involving lentiform nucleus as well as IC and small subcortical infarct left anterior frontal lobe. He was placed on ASA for secondary stroke prevention.   During today's visit, Mr. Faircloth denied experiencing any cognitive issues. His only complaint was right-sided motor deficits and certain speech changes. And while he exhibited significantly slurred speech, he said that it has always been slurred.   Mr. Buckles described his mood as "exceptionally well" and better than he expected. He has a history of mental health treatment for Bipolar disorder and mentioned that he has been collecting disability benefits owing to this diagnosis. He reported being prescribed multiple benzodiazepines that he has taken for years and during this stay. He admits to feeling "drunk" today and that today is the only day he has not been feeling generally well. He said he discussed these feelings with his nurse and did not believe they required urgent care.     Mr. Mozley described himself as stubborn and very independent. He is not happy about the fact that he will have to rely on someone else upon discharge. He called himself a "cripple" but he said he understands  that he may be able to eventually function more independently after he has time to recover.   Mr. Dentremont said that the rehab staff has been "great" and he feels that he is making strides in therapy. He is glad that he decided to participate in rehab as he was initially going to try and just discharge home. He says he does not have a great social support system. He has few friends and family to rely on and actually said that his daughter recently moved in with him and he was planning to "help her."    PROCEDURES ADMINISTERED: [1 unit [phone] on 10/08/13] Diagnostic clinical interview  Review of available records  Emotional & Behavioral Evaluation: Mr. Yurkovich was appropriately dressed for season and situation. He was generally friendly and rapport was adequately established. His speech was very slurred and he tended to sway back and forth in his wheelchair. His affect was somewhat blunted. Attention and motivation were adequate.   From an emotional standpoint, Mr. Magnussen admitted to a history of treatment for Bipolar disorder. He has reportedly been taking multiple anxiolytics that he finds to be helpful. He is reportedly adjusting well to his hospital stay with little to no depressive or anxiety-related issues. Suicidal/homicidal ideation, plan or intent was denied. No manic or hypomanic episodes were reported. The patient denied ever experiencing any auditory/visual hallucinations. No major behavioral or personality changes were endorsed.    Overall, Mr. Schnitzer appears to be adjusting well to his hospital stay. He expressed mild trepidation about relying on others when he discharges home but understands the need. He does not identify any barriers to therapy and has been compliant.  No major issues with mood or cognition were endorsed. However, given the nature and placement of the strokes he suffered, neuropsychological testing as an outpatient would be valuable.   RECOMMENDATIONS    Follow-up as  needed during his admission.    Schedule outpatient neuropsychological evaluation for 8-12 months.    Rutha Bouchard, Psy.D.  Clinical Neuropsychologist

## 2013-10-09 NOTE — Progress Notes (Signed)
Subjective/Complaints: Slept better , + restless leg symptoms this am, felt "drunk" yesterday, pt states BP low at that time A 12 point review of systems has been performed and if not noted above is otherwise negative.   Objective: Vital Signs: Blood pressure 182/86, pulse 61, temperature 98.4 F (36.9 C), temperature source Oral, resp. rate 17, weight 90.266 kg (199 lb), SpO2 96.00%. Dg Chest 2 View  10/09/2013   CLINICAL DATA:  Dyspnea  EXAM: CHEST  2 VIEW  COMPARISON:  09/26/2013  FINDINGS: An ill-defined nodule persists at the the left base, indistinct appearance making size estimation difficult. The nodule correlates with abnormality seen on previous chest CT. No cardiomegaly. Unchanged upper mediastinal contours. No edema, consolidation, effusion, or pneumothorax.  IMPRESSION: 1. No edema or consolidation. 2. Persistent nodule at the left base, reference chest CT 09/20/2013.   Electronically Signed   By: Jorje Guild M.D.   On: 10/09/2013 05:52    Recent Labs  10/08/13 1218  WBC 17.2*  HGB 11.3*  HCT 33.0*  PLT 283    Recent Labs  10/08/13 1218  NA 133*  K 4.4  CL 94*  GLUCOSE 79  BUN 14  CREATININE 1.20  CALCIUM 8.8   CBG (last 3)  No results found for this basename: GLUCAP,  in the last 72 hours  Wt Readings from Last 3 Encounters:  10/03/13 90.266 kg (199 lb)  09/28/13 89.812 kg (198 lb)    Physical Exam:  Constitutional: . He appears well-developed and well-nourished.  HENT:  Head: Normocephalic and atraumatic. Ear ring left ear  Eyes: Conjunctivae are normal. Pupils are equal, round, and reactive to light.  Neck: Normal range of motion. Neck supple.  Cardiovascular: Normal rate and regular rhythm. No murmurs  Respiratory: Effort normal and breath sounds normal. No respiratory distress. He has occ wheezes. No rales  GI: Soft. Bowel sounds are normal. He exhibits no distension.  no tenderness.  Musculoskeletal: He exhibits trace edema RUE.[name]   Neurological: exam unchanged: He is alert and oriented to person, place, and time.  Mild dysarthria with occasional word finding difficulties. . Mild facial weakness with minimal tongue deviation. Follows basic commands without difficulty. Right hemiparesis: RUE 0/5 deltoid, bicep,2- tricep,0 HI. RLE 2- HF, 3- KE, 1+ ADF and APF. Sensation appears grossly intact RUE and RLE. DTR's 2+ Right arm and leg. 1+ Left side. Reasonable insight and awareness.  Skin: Skin is warm and dry.  Psych: no lability   Assessment/Plan: 1. Functional deficits secondary to left lacunar CR, BG, and IC infarcts, left subcortical frontal infarct which require 3+ hours per day of interdisciplinary therapy in a comprehensive inpatient rehab setting. Physiatrist is providing close team supervision and 24 hour management of active medical problems listed below. Physiatrist and rehab team continue to assess barriers to discharge/monitor patient progress toward functional and medical goals. FIM: FIM - Bathing Bathing Steps Patient Completed: Chest;Abdomen;Front perineal area;Buttocks;Right upper leg;Left upper leg;Right lower leg (including foot);Left lower leg (including foot);Right Arm Bathing: 4: Min-Patient completes 8-9 48f 10 parts or 75+ percent  FIM - Upper Body Dressing/Undressing Upper body dressing/undressing steps patient completed: Thread/unthread left sleeve of pullover shirt/dress;Put head through opening of pull over shirt/dress;Pull shirt over trunk;Thread/unthread right sleeve of pullover shirt/dresss Upper body dressing/undressing: 5: Set-up assist to: Obtain clothing/put away FIM - Lower Body Dressing/Undressing Lower body dressing/undressing steps patient completed: Don/Doff right sock;Don/Doff left sock;Thread/unthread right pants leg;Thread/unthread left pants leg;Pull pants up/down;Don/Doff right  shoe;Don/Doff left shoe Lower body dressing/undressing: 4: Min-Patient completed 75 plus % of tasks  FIM  - Toileting Toileting steps completed by patient: Adjust clothing prior to toileting;Performs perineal hygiene;Adjust clothing after toileting Toileting Assistive Devices: Grab bar or rail for support Toileting: 6: Assistive device: No helper  FIM - Radio producer Devices: Grab bars Toilet Transfers: 5-To toilet/BSC: Supervision (verbal cues/safety issues);4-To toilet/BSC: Min A (steadying Pt. > 75%);5-From toilet/BSC: Supervision (verbal cues/safety issues);4-From toilet/BSC: Min A (steadying Pt. > 75%)  FIM - Bed/Chair Transfer Bed/Chair Transfer Assistive Devices: Arm rests Bed/Chair Transfer: 4: Bed > Chair or W/C: Min A (steadying Pt. > 75%);4: Chair or W/C > Bed: Min A (steadying Pt. > 75%)  FIM - Locomotion: Wheelchair Distance: 225 Locomotion: Wheelchair: 5: Travels 150 ft or more: maneuvers on rugs and over door sills with supervision, cueing or coaxing FIM - Locomotion: Ambulation Locomotion: Ambulation Assistive Devices: Orthosis;Other (comment);Walker - Rolling (R AFO; R hand orthosis) Ambulation/Gait Assistance: 3: Mod assist Locomotion: Ambulation: 1: Travels less than 50 ft with moderate assistance (Pt: 50 - 74%)  Comprehension Comprehension Mode: Auditory Comprehension: 6-Follows complex conversation/direction: With extra time/assistive device  Expression Expression Mode: Verbal Expression: 5-Expresses complex 90% of the time/cues < 10% of the time  Social Interaction Social Interaction: 5-Interacts appropriately 90% of the time - Needs monitoring or encouragement for participation or interaction.  Problem Solving Problem Solving: 5-Solves complex 90% of the time/cues < 10% of the time  Memory Memory: 6-More than reasonable amt of time  Medical Problem List and Plan:  1. DVT Prophylaxis/Anticoagulation: Pharmaceutical: Lovenox  2. Pain Management: prn tylenol.  3. Bipolar disorder/Mood: Continue tegretol, ambien, as well as Ambien  for chronic insomnia. Team to provide ego support. LSCW to follow for evaluation and support.  4. Neuropsych: This patient is capable of making decisions on his own behalf.  5. HTN: continued poor control. increase prinizide to 40, continue metoprolol and imdur. Increased HCTZ to 25mg  titrate meds, BPs fluctuating, monitor 6. COPD: will add albuterol MDI prn SOB.  7. Hyperglycemia:  check Hgb A1C--check CBG , am value low today, rec hs snack.  8. Dyslipidemia: continue lipitor.  9. Drug/Tobacco abuse: education provided today regarding stroke risks, etc. Education ongoing 10.  RLS trial requip, no help last noc, cont current dose for now , may need to increase LOS (Days) 11 A FACE TO FACE EVALUATION WAS PERFORMED  Charlett Blake 10/09/2013 7:33 AM

## 2013-10-09 NOTE — Plan of Care (Signed)
Problem: RH Ambulation Goal: LTG Patient will ambulate in community environment (PT) LTG: Patient will ambulate in community environment, # of feet with assistance (PT).  Outcome: Not Applicable Date Met:  36/43/83 N/A, as community gait will not be functional for patient at time of discharge.

## 2013-10-09 NOTE — Progress Notes (Signed)
Physical Therapy Session Note  Patient Details  Name: David Hall MRN: [id number] Date of Birth: 1956-04-01  Today's Date: 10/09/2013 Time: 1000-1058 Time Calculation (min): 58 min  Short Term Goals: Week 2:  PT Short Term Goal 1 (Week 2): STG's=LTG's secondary to ELOS  Skilled Therapeutic Interventions/Progress Updates:    Pt received seated in w/c; agreeable to therapy. Pt reports feeling "better" today. Pt received R AFO (WalkOn) yesterday. Session focused on gait training using customized AFO. Pt performed w/c mobility x120' in controlled environment with supervision, increased time. Gait x8' with LUE support at parallel bars with mod A, multimodal cueing focused on R single limb stance stability. Tactile cueing provided at distal R quadriceps during RLE stance phase to facilitate R knee extension, R gluteus maximus during RLE stance phase to promote hip extension. Transitioned to performing multiple gait trials x10-15' in controlled environment with LUE support at hallway handrail with mod A, manual facilitation/cueing as described above. Final gait trial x12' in controlled environment with rolling walker, R hand orthosis, min-mod A, manual facilitation at R axilla, inferior to L ribcage for postural normalization; pt with effective within-session carryover of RLE extension during gait.   Pt performed w/c mobility 2x155' (to/from pt room) in controlled environment using L hemi technique with supervision. Performed multiple squat pivot transfers with close supervision, cueing for safety awareness (focus on hand placement, speed of movement) with ~50% of transfers. In pt room, pt performed sit>supine with supervision, no rails, HOB flat. Scooting to [place] with supervision, mod verbal cueing for technique. Therapist departed with pt semi-reclined in bed with 3 bed rails up, bed alarm on, and all needs within reach.   The following long term goals were downgraded secondary to slow pt progress:  gait in community and home environments and w/c mobility. Long term goal addressing community gait was discharged, as gait will not be a functional means of mobility in community at discharge.  Therapy Documentation Precautions:  Precautions Precautions: Fall Precaution Comments: impulsive Restrictions Weight Bearing Restrictions: No Pain: Pain Assessment Pain Assessment: No/denies pain Pain Score: 0-No pain Locomotion : Ambulation Ambulation/Gait Assistance: 3: Mod assist Wheelchair Mobility Distance: 155   See FIM for current functional status  Therapy/Group: Individual Therapy  Hobble, Malva Cogan 10/09/2013, 12:36 PM

## 2013-10-09 NOTE — Progress Notes (Signed)
Occupational Therapy Session Note  Patient Details  Name: David Hall MRN: [id number] Date of Birth: Feb 20, 1956  Today's Date: 10/09/2013 Time: [phone] and [phone]  Time Calculation (min): 55 min and 30 min   Short Term Goals: Week 1:  OT Short Term Goal 1 (Week 1): Patient will complete upper body bathing and dressing with setup assist OT Short Term Goal 1 - Progress (Week 1): Partly met OT Short Term Goal 2 (Week 1): Patient will complete lower body bathing and dressing with mod assist OT Short Term Goal 2 - Progress (Week 1): Met OT Short Term Goal 3 (Week 1): Patient will complete transfer, w/c <> toilet, with min assist OT Short Term Goal 3 - Progress (Week 1): Met OT Short Term Goal 4 (Week 1): Patient will complete seated grooming and hygiene with min assist OT Short Term Goal 4 - Progress (Week 1): Met Week 2: Focus on LTGs  Skilled Therapeutic Interventions/Progress Updates:    Session 1: Pt seen for ADL retraining with focus on functional transfers, functional use of RUE, and awareness of RUE/RLE. Pt received sitting EOB and requesting to sponge bathe this AM. BP 142/69 and HR 60. Pt reported feeling much better this AM. Completed squat pivot transfer bed>w/c to left with min guard. Completed bathing requiring min assist to wash upper LUE with HOH assist. Pt with min cues for positioning of RUE during bathing. Completed dressing with assist for donning orthosis and fastening shoes. Pt propelled self to ADL apartment with increased time and no rest break. Practiced tub transfer with min assist initially for squat pivot transfer then progressing to supervision with min verbal cues for positioning of RLE and anterior weight shift. Practiced squat pivot transfers bed<>w/c at supervision level. At end of session pt returned to room and left with all needs in reach. Pt's SpO2>95% throughout session and HR 58-61.   Session 2: Pt seen for 1:1 OT session with focus on safety  awareness, standing balance, and awareness of RUE/RLE during home management task. Engaged in simple meal prep task with focus on safe retrieval of items from various heights and transportation of food from w/c level. Discussed kitchen setup and keeping most used items on counter so pt can reach from w/c level. Pt very open to all suggestions from therapist and reports having most items in easy to reach places. Practiced standing at refrigerator to transport item from top freezer with min cues for not pulling up on handle of refrigerator. Pt verbalized why this was unsafe and demonstrated carryover throughout remainder of session. Practiced placing and removing pan from oven with good safety for positioning of w/c and using oven mitt. Discussed approaching oven from left side (unaffected) for increased safety and pt reports able to do so in his kitchen. Practiced transporting items to kitchen table and pt able to do so safely. Pt with min cues for positioning of w/c for sit<>stand at countertop to retrieve items from overhead cabinet. Pt required min cues for positioning of RUE throughout session. At end of session engaged in towel glide exercises to RUE with min assist for shoulder protraction and abduction and pt with active shoulder retraction and adduction. Pt returned to room and left with all items in reach.   Therapy Documentation Precautions:  Precautions Precautions: Fall Precaution Comments: impulsive Restrictions Weight Bearing Restrictions: No General:   Vital Signs: Therapy Vitals Temp: 98.4 F (36.9 C) Temp src: Oral Pulse Rate: 61 Resp: 17 BP: 182/86 mmHg Patient Position, if  appropriate: Standing Oxygen Therapy SpO2: 97 % O2 Device: None (Room air) Pain: Pt with report of headache and not providing numerical pain.   See FIM for current functional status  Therapy/Group: Individual Therapy  Duayne Cal 10/09/2013, 9:29 AM

## 2013-10-09 NOTE — Progress Notes (Signed)
Recreational Therapy Assessment and Plan  Patient Details  Name: David Hall MRN: [id number] Date of Birth: May 12, 1956 Today's Date: 10/09/2013  Rehab Potential: Good ELOS: 2 weeks   Assessment Clinical Impression:Problem List:  Patient Active Problem List    Diagnosis  Date Noted   .  CVA (cerebral infarction)  09/28/2013    Past Medical History:  Past Medical History   Diagnosis  Date   .  Hypertension    .  GERD (gastroesophageal reflux disease)    .  Bipolar disorder    .  COPD (chronic obstructive pulmonary disease)    .  Shortness of breath    .  Insomnia, controlled     Past Surgical History: No past surgical history on file.  Assessment & Plan  Clinical Impression: Patient is a 58 y.o. year old male who presented to [facility] on [facility] with right sided weakness and slurred speech. Pt's symptoms occurred for more than 12 hours prior to admit. Pt did not call EMS because he felt he could get over it. With no improvement, he called EMS eventually. Pt presented to ER with new weakness in right UE/LE, impaired slurred speech and unsteady gait. Initial head CT negative for acute findings. MRI demonstrated L frontal and corona radiata CVA. Note that pt had recent admit at [facility] on 09-20-13 for chest pain and shortness of breath and received treatment for PE. Pt left AMA during that admit. Pt does have history of using cocaine. Pt has been participating well with skilled PT, OT and SLP and further acute inpatient rehab was recommended. Pt is motivated to maximize his independence at inpatient rehab. Patient transferred to CIR on 09/28/2013.   Pt presents with decreased activity tolerance, decreased functional mobility, decreased balance, dysarthria, right sided weakness Limiting pt's independence with leisure/community pursuits.   Leisure History/Participation Premorbid leisure interest/current participation: [place] - Fishing;Community -  Grocery store;Community - Travel (Comment) Other Leisure Interests: Television Leisure Participation Style: Alone Futures trader Resources: Fair-identify 2 post discharge leisure resources Psychosocial / Spiritual Social interaction - Mood/Behavior: Cooperative Engineer, drilling for Education?: Yes Recreational Therapy Orientation Orientation -Reviewed with patient: Available activity resources Strengths/Weaknesses Patient Strengths/Abilities: Willingness to participate Patient weaknesses: Physical limitations;Minimal Premorbid Leisure Activity TR Patient demonstrates impairments in the following area(s): Endurance;Motor;Safety TR Additional Impairment(s): None  Plan Rec Therapy Plan Is patient appropriate for Therapeutic Recreation?: Yes Rehab Potential: Good Treatment times per week: Min 1 time per week >20 minutes Estimated Length of Stay: 2 weeks TR Treatment/Interventions: Adaptive equipment instruction;1:1 session;Balance/vestibular training;Functional mobility training;Community reintegration;Patient/family education;Therapeutic activities;Recreation/leisure participation;Therapeutic exercise;UE/LE Coordination activities Recommendations for other services: Neuropsych  Recommendations for other services: Neuropsych  Discharge Criteria: Patient will be discharged from TR if patient refuses treatment 3 consecutive times without medical reason.  If treatment goals not met, if there is a change in medical status, if patient makes no progress towards goals or if patient is discharged from hospital.  The above assessment, treatment plan, treatment alternatives and goals were discussed and mutually agreed upon: by patient  [place] 10/09/2013, 4:11 PM

## 2013-10-09 NOTE — Progress Notes (Signed)
Patient with increase in coughing episodes for the past few days with complaints of chest tightness. Will start on levaquin for bronchitis treatment. With with history of lung nodule and was in process of getting work up at time of admission. To follow up past discharge.

## 2013-10-09 NOTE — Progress Notes (Signed)
Speech Language Pathology Daily Session Note  Patient Details  Name: David Hall MRN: [id number] Date of Birth: [birth year]  Today's Date: 10/09/2013 Time: 8127-5170 Time Calculation (min): 42 min  Short Term Goals: Week 2: SLP Short Term Goal 1 (Week 2): Pt will utilize speech intelligibility strategies at the conversational level with Mod I  Skilled Therapeutic Interventions: Treatment focused on speech goals. SLP facilitated session with descriptive language task. Pt participated in task, utilizing speech strategies at the sentence-conversational level with supervision level cueing. During spontaneous, casual conversation about personal hobbies and interests, pt utilized his strategies with Mod I and was intelligible >90% of the time. Continue plan of care.   FIM:  Comprehension Comprehension Mode: Auditory Comprehension: 6-Follows complex conversation/direction: With extra time/assistive device Expression Expression Mode: Verbal Expression: 5-Expresses complex 90% of the time/cues < 10% of the time Social Interaction Social Interaction: 5-Interacts appropriately 90% of the time - Needs monitoring or encouragement for participation or interaction. Problem Solving Problem Solving: 5-Solves complex 90% of the time/cues < 10% of the time Memory Memory: 6-More than reasonable amt of time  Pain Pain Assessment Pain Assessment: No/denies pain  Therapy/Group: Individual Therapy   Germain Osgood, M.A. CCC-SLP [phone]  Germain Osgood 10/09/2013, 4:32 PM

## 2013-10-10 ENCOUNTER — Ambulatory Visit (HOSPITAL_COMMUNITY): Payer: Medicare Other | Admitting: *Deleted

## 2013-10-10 ENCOUNTER — Inpatient Hospital Stay (HOSPITAL_COMMUNITY): Payer: Medicare Other

## 2013-10-10 ENCOUNTER — Inpatient Hospital Stay (HOSPITAL_COMMUNITY): Payer: Medicare Other | Admitting: Physical Therapy

## 2013-10-10 DIAGNOSIS — I10 Essential (primary) hypertension: Secondary | ICD-10-CM | POA: Diagnosis not present

## 2013-10-10 DIAGNOSIS — I6350 Cerebral infarction due to unspecified occlusion or stenosis of unspecified cerebral artery: Secondary | ICD-10-CM | POA: Diagnosis not present

## 2013-10-10 DIAGNOSIS — F319 Bipolar disorder, unspecified: Secondary | ICD-10-CM | POA: Diagnosis not present

## 2013-10-10 LAB — URINE CULTURE
Colony Count: NO GROWTH
Culture: NO GROWTH

## 2013-10-10 MED ORDER — ENOXAPARIN SODIUM 40 MG/0.4ML ~~LOC~~ SOLN
40.0000 mg | Freq: Every day | SUBCUTANEOUS | Status: DC
  Administered 2013-10-10 – 2013-10-11 (×2): 40 mg via SUBCUTANEOUS
  Filled 2013-10-10 (×3): qty 0.4

## 2013-10-10 NOTE — Progress Notes (Addendum)
Physical Therapy Session Note  Patient Details  Name: David Hall MRN: [id number] Date of Birth: 10-24-1955  Today's Date: 10/10/2013 Time: 0805-0900 and 1431-1530 Time Calculation (min): 55 min and 59 min  Short Term Goals: Week 2:  PT Short Term Goal 1 (Week 2): STG's=LTG's secondary to ELOS  Skilled Therapeutic Interventions/Progress Updates:    Treatment Session 1: Co-treatment with rec therapist. Pt received seated EOB eating breakfast. No c/o pain. Session focused on negotiation of ramp with w/c, negotiation of small step to enter home. Seated EOM, donned pants, shirt with SBA for dynamic sitting balance. Therapist donned R AFO. Pt performed w/c mobility x220' total in controlled environment using L hemi-technique with supervision. Negotiated ramp x3 trials total; initial trial forward-facing to ascend/descend with min A, max verbal cueing for technique. Second trial backward to ascend, forward-facing to descend with min guard. Final trial of ramp negotiation: ascended forward-facing with min A and min verbal cueing; descended with supervision.  Negotiation single 4" step with rolling walker and mod A, mod verbal cueing for technique.  PT recommended that daughter bump w/c up/down small step to enter home. Pt verbalized agreement. Returned to pt room, where pt was left seated in w/c with all needs within reach.   Treatment Session 2: Individual treatment. Pt received seated on toilet with nurse tech present. Performed transfer from toilet>w/c with min A. Session focused on gait training and ramp negotiation in w/c. Pt performed w/c mobility x200' in controlled, home, and community environments with mod I.   Therapist modified (lowered) heel lift in R shoe in attempt to facilitate R knee extension during RLE initial contact, single limb stance phase of gait. Performed gait x20' in controlled environment with rolling walker, R hand orthosis, and R AFO with decreased height of heel lift; pt  with increased R knee extension during RLE stance phase of gait. Removed heel lift from shoe. Performed gait x35' in controlled environment with rolling walker, R hand orthosis, no heel lift, and min guard; R knee extension improved and pt reports perception of increased gait stability.   Transported pt outside hospital lobby in w/c, where pt negotiated 3% grade ramp in w/c (ascended backward, descended forward) with mod I. Transported pt back to rehab unit to return to pt room, where pt performed squat pivot transfer from w/c>bed with mod I, sit>supine with mod I. Therapist departed with pt supine in bed with bed alarm on, 3 bed rails up, and all needs within reach.   Long term goals addressing the following were modified: dynamic standing balance downgraded secondary to pt progress; negotiation of 1/2 step landing to enter home was discharged secondary to mod A required to negotiate landing during AM session. Long term goal added to address family member independently bumping w/c up small step.   Addendum: Downgraded long term goal addressing floor transfer to min A secondary to slow pt progress.  Therapy Documentation Precautions:  Precautions Precautions: Fall Precaution Comments: impulsive Restrictions Weight Bearing Restrictions: No Pain: Pain Assessment Pain Assessment: No/denies pain Locomotion : Wheelchair Mobility Distance: 220   See FIM for current functional status  Therapy/Group: Individual Therapy and Co-Treatment  Lucien Budney, Malva Cogan 10/10/2013, 12:38 PM

## 2013-10-10 NOTE — Progress Notes (Signed)
Speech Language Pathology Daily Session Note  Patient Details  Name: ESAU FRIDMAN MRN: [id number] Date of Birth: 1955/12/08  Today's Date: 10/10/2013 Time: 0900-0930 Time Calculation (min): 30 min  Short Term Goals: Week 2: SLP Short Term Goal 1 (Week 2): Pt will utilize speech intelligibility strategies at the conversational level with Mod I  Skilled Therapeutic Interventions: Treatment focused on speech goals. SLP facilitated session with tongue twister exercises. Pt used his speech intelligibility strategies during exercises and throughout functional conversation with Mod I. Continue plan of care.   FIM:  Comprehension Comprehension Mode: Auditory Comprehension: 6-Follows complex conversation/direction: With extra time/assistive device Expression Expression Mode: Verbal Expression: 6-Expresses complex ideas: With extra time/assistive device Social Interaction Social Interaction: 5-Interacts appropriately 90% of the time - Needs monitoring or encouragement for participation or interaction. Problem Solving Problem Solving: 5-Solves complex 90% of the time/cues < 10% of the time Memory Memory: 6-More than reasonable amt of time  Pain Pain Assessment Pain Assessment: No/denies pain  Therapy/Group: Individual Therapy   Germain Osgood, M.A. CCC-SLP [phone]  Germain Osgood 10/10/2013, 11:36 AM

## 2013-10-10 NOTE — Progress Notes (Signed)
Social Work Patient ID: David Hall, male   DOB: 1956-06-11, 59 y.o.   MRN: [id number] Pt requires a light weight wheelchair due to he is unable to self propel a standard weight wheelchair.  He uses the wheelchair for self care tasks also.

## 2013-10-10 NOTE — Progress Notes (Signed)
Social Work Patient ID: David Hall, male   DOB: 07/17/1956, 58 y.o.   MRN: [id number] Met with pt to discuss team conference and once family education completed tomorrow with daughter will be ready for discharge Friday. Pt is glad to be going home soon he is ready and tired of being in a hospital.  Will see how daughter does tomorrow.

## 2013-10-10 NOTE — Progress Notes (Signed)
Recreational Therapy Session Note  Patient Details  Name: VENCE LALOR MRN: [id number] Date of Birth: 1955-10-27 Today's Date: 10/10/2013  Pain: no c/o Skilled Therapeutic Interventions/Progress Updates: Session focused on w/c mobility and ramp negotiation in preparation for community reintegration.  Pt propelled w/c with supervision, min cues and required min assist & max cues for ramp negotiation.  Discussion/education provided about community pursuits. Pt stated understanding  Brieana Shimmin 10/10/2013, 4:48 PM

## 2013-10-10 NOTE — Progress Notes (Signed)
Occupational Therapy Session Note  Patient Details  Name: David Hall MRN: [id number] Date of Birth: 17-Aug-1955  Today's Date: 10/10/2013 Time: 1120-1201 Time Calculation (min): 41 min  Short Term Goals: Week 1:  OT Short Term Goal 1 (Week 1): Patient will complete upper body bathing and dressing with setup assist OT Short Term Goal 1 - Progress (Week 1): Partly met OT Short Term Goal 2 (Week 1): Patient will complete lower body bathing and dressing with mod assist OT Short Term Goal 2 - Progress (Week 1): Met OT Short Term Goal 3 (Week 1): Patient will complete transfer, w/c <> toilet, with min assist OT Short Term Goal 3 - Progress (Week 1): Met OT Short Term Goal 4 (Week 1): Patient will complete seated grooming and hygiene with min assist OT Short Term Goal 4 - Progress (Week 1): Met Focus on LTGs  Skilled Therapeutic Interventions/Progress Updates:    Pt seen for 1:1 OT session with focus on functional transfers, standing balance, and NMR to RUE. Pt received sitting in w/c reporting he already completed bathing at sink this AM. Practiced toilet transfer and simulated toilet task. Pt initially attempting to complete stand pivot transfer required min assist. Used questioning cues for correction of transfer technique and pt self corrected to squat pivot transfers completing 2x at supervision level. Pt completed sit<>stand each time with supervision for clothing management. Completed tub transfer with pt setting w/c up. Completed min assist initially for management of RLE into tub. Educated on using towel to assist with lifting RLE and pt successful. Pt then progressed to completing at supervision level. Engaged in reaching task in standing with focus on weight bearing through RUE for strengthening and proprioceptive input. At end of session pt left sitting in w/c with all needs in reach.   Therapy Documentation Precautions:  Precautions Precautions: Fall Precaution Comments:  impulsive Restrictions Weight Bearing Restrictions: No General:   Vital Signs:   Pain: No report of pain during therapy session.  See FIM for current functional status  Therapy/Group: Individual Therapy  Duayne Cal 10/10/2013, 12:10 PM

## 2013-10-10 NOTE — Progress Notes (Signed)
Subjective/Complaints: Slept better , + restless leg symptoms this am, felt "drunk" yesterday, pt states BP low at that time A 12 point review of systems has been performed and if not noted above is otherwise negative.   Objective: Vital Signs: Blood pressure 183/78, pulse 66, temperature 98 F (36.7 C), temperature source Oral, resp. rate 18, weight 90.3 kg (199 lb 1.2 oz), SpO2 96.00%. Dg Chest 2 View  10/09/2013   CLINICAL DATA:  Dyspnea  EXAM: CHEST  2 VIEW  COMPARISON:  09/26/2013  FINDINGS: An ill-defined nodule persists at the the left base, indistinct appearance making size estimation difficult. The nodule correlates with abnormality seen on previous chest CT. No cardiomegaly. Unchanged upper mediastinal contours. No edema, consolidation, effusion, or pneumothorax.  IMPRESSION: 1. No edema or consolidation. 2. Persistent nodule at the left base, reference chest CT 09/20/2013.   Electronically Signed   By: Jorje Guild M.D.   On: 10/09/2013 05:52    Recent Labs  10/08/13 1218  WBC 17.2*  HGB 11.3*  HCT 33.0*  PLT 283    Recent Labs  10/08/13 1218  NA 133*  K 4.4  CL 94*  GLUCOSE 79  BUN 14  CREATININE 1.20  CALCIUM 8.8   CBG (last 3)  No results found for this basename: GLUCAP,  in the last 72 hours  Wt Readings from Last 3 Encounters:  10/10/13 90.3 kg (199 lb 1.2 oz)  09/28/13 89.812 kg (198 lb)    Physical Exam:  Constitutional: . He appears well-developed and well-nourished.  HENT:  Head: Normocephalic and atraumatic. Ear ring left ear  Eyes: Conjunctivae are normal. Pupils are equal, round, and reactive to light.  Neck: Normal range of motion. Neck supple.  Cardiovascular: Normal rate and regular rhythm. No murmurs  Respiratory: Effort normal and breath sounds normal. No respiratory distress. He has occ wheezes. No rales  GI: Soft. Bowel sounds are normal. He exhibits no distension.  no tenderness.  Musculoskeletal: He exhibits trace edema  RUE.[name]  Neurological: exam unchanged: He is alert and oriented to person, place, and time.  Mild dysarthria with occasional word finding difficulties. . Mild facial weakness with minimal tongue deviation. Follows basic commands without difficulty. Right hemiparesis: RUE 0/5 deltoid, bicep,2- tricep,0 HI. RLE 2- HF, 3- KE, 1+ ADF and APF. Sensation appears grossly intact RUE and RLE. DTR's 2+ Right arm and leg. 1+ Left side. Reasonable insight and awareness.  Skin: Skin is warm and dry.  Psych: no lability   Assessment/Plan: 1. Functional deficits secondary to left lacunar CR, BG, and IC infarcts, left subcortical frontal infarct which require 3+ hours per day of interdisciplinary therapy in a comprehensive inpatient rehab setting. Physiatrist is providing close team supervision and 24 hour management of active medical problems listed below. Physiatrist and rehab team continue to assess barriers to discharge/monitor patient progress toward functional and medical goals. FIM: FIM - Bathing Bathing Steps Patient Completed: Chest;Abdomen;Front perineal area;Buttocks;Right upper leg;Left upper leg;Right lower leg (including foot);Left lower leg (including foot);Right Arm Bathing: 4: Min-Patient completes 8-9 19f 10 parts or 75+ percent  FIM - Upper Body Dressing/Undressing Upper body dressing/undressing steps patient completed: Thread/unthread left sleeve of pullover shirt/dress;Put head through opening of pull over shirt/dress;Pull shirt over trunk;Thread/unthread right sleeve of pullover shirt/dresss Upper body dressing/undressing: 5: Set-up assist to: Obtain clothing/put away FIM - Lower Body Dressing/Undressing Lower body dressing/undressing steps patient completed: Don/Doff right sock;Don/Doff left sock;Thread/unthread right pants leg;Thread/unthread left pants  leg;Pull pants up/down;Don/Doff left shoe Lower body dressing/undressing: 4: Min-Patient completed 75 plus % of tasks  FIM -  Toileting Toileting steps completed by patient: Adjust clothing prior to toileting;Performs perineal hygiene;Adjust clothing after toileting Toileting Assistive Devices: Grab bar or rail for support Toileting: 6: Assistive device: No helper  FIM - Radio producer Devices: Grab bars Toilet Transfers: 5-To toilet/BSC: Supervision (verbal cues/safety issues);4-To toilet/BSC: Min A (steadying Pt. > 75%);5-From toilet/BSC: Supervision (verbal cues/safety issues);4-From toilet/BSC: Min A (steadying Pt. > 75%)  FIM - Bed/Chair Transfer Bed/Chair Transfer Assistive Devices: Arm rests Bed/Chair Transfer: 5: Chair or W/C > Bed: Supervision (verbal cues/safety issues);5: Bed > Chair or W/C: Supervision (verbal cues/safety issues);5: Sit > Supine: Supervision (verbal cues/safety issues) (close supervision)  FIM - Locomotion: Wheelchair Distance: 155 Locomotion: Wheelchair: 5: Travels 150 ft or more: maneuvers on rugs and over door sills with supervision, cueing or coaxing FIM - Locomotion: Ambulation Locomotion: Ambulation Assistive Devices: Orthosis;Other (comment);Walker - Rolling (R AFO, R hand orthosis) Ambulation/Gait Assistance: 3: Mod assist Locomotion: Ambulation: 1: Travels less than 50 ft with moderate assistance (Pt: 50 - 74%)  Comprehension Comprehension Mode: Auditory Comprehension: 6-Follows complex conversation/direction: With extra time/assistive device  Expression Expression Mode: Verbal Expression: 5-Expresses complex 90% of the time/cues < 10% of the time  Social Interaction Social Interaction: 5-Interacts appropriately 90% of the time - Needs monitoring or encouragement for participation or interaction.  Problem Solving Problem Solving: 5-Solves complex 90% of the time/cues < 10% of the time  Memory Memory: 6-More than reasonable amt of time  Medical Problem List and Plan:  1. DVT Prophylaxis/Anticoagulation: Pharmaceutical: Lovenox  2. Pain  Management: prn tylenol.  3. Bipolar disorder/Mood: Continue tegretol, ambien, as well as Ambien for chronic insomnia. Team to provide ego support. LSCW to follow for evaluation and support.  4. Neuropsych: This patient is capable of making decisions on his own behalf.  5. HTN: continued poor control. increase prinizide to 40, continue metoprolol and imdur. Increased HCTZ to 25mg  titrate meds, BPs fluctuating, monitor 6. COPD: will add albuterol MDI prn SOB.  7. Hyperglycemia:  check Hgb A1C--check CBG , am value low today, rec hs snack.  8. Dyslipidemia: continue lipitor.  9. Drug/Tobacco abuse: education provided today regarding stroke risks, etc. Education ongoing 10.  RLS trial requip, no help last noc, cont current dose for now , may need to increase 11.  Lung nodule left base, repeat CT chest as outpt,f/u with PCP on this LOS (Days) 12 A FACE TO FACE EVALUATION WAS PERFORMED  Charlett Blake 10/10/2013 8:33 AM

## 2013-10-10 NOTE — Patient Care Conference (Signed)
Inpatient RehabilitationTeam Conference and Plan of Care Update Date: 10/10/2013   Time: 10;30 AM    Patient Name: David Hall      Medical Record Number: [id number]  Date of Birth: 07-30-1956 Sex: Male         Room/Bed: 4M08C/4M08C-01 Payor Info: Payor: MEDICARE / Plan: MEDICARE PART A AND B / Product Type: *No Product type* /    Admitting Diagnosis: Frontal CVA  Admit Date/Time:  09/28/2013  2:54 PM Admission Comments: No comment available   Primary Diagnosis:  <principal problem not specified> Principal Problem: <principal problem not specified>  Patient Active Problem List   Diagnosis Date Noted  . CVA (cerebral infarction) 09/28/2013    Expected Discharge Date: Expected Discharge Date: 10/12/13  Team Members Present: Physician leading conference: Dr. Alysia Penna Social Worker Present: Alfonse Alpers, LCSW;Becky Duey Liller, LCSW Nurse Present: Other (comment) Loree Fee Reardon-RN) PT Present: Georjean Mode, PT;Other (comment) Jilda Roche) OT Present: Antony Salmon, Roland Earl, OT SLP Present: Germain Osgood, SLP PPS Coordinator present : Ileana Ladd, Lelan Pons, RN, CRRN     Current Status/Progress Goal Weekly Team Focus  Medical   labile, resistance to recs  establish d/c rec  pt and family ed   Bowel/Bladder   cont. bowel and bladder, LBM 10/09/13  cont of bladder and bowel  remain cont of bladder and bowel   Swallow/Nutrition/ Hydration   regular textures and thin liquids with Mod I  Mod I with least restrictive PO  goal level met and education complete   ADL's   min assist bathing and grooming, setup/supervision tub transfer and UB dressing, steadying to sup standing balance during self-care tasks, min-sup toilet transfer  supervision shower transfer, and  LB dressing, Mod I toilet tranfers, toilet task, and UB dressing, min assist grooming  and bathing  R NMR, functional transfers, safety awareness, standing balacnce, family education/training    Mobility   Close supervision with transfers, Min-Mod A with gait, Min A with stair negotiation, supervision with w/c mobility  Mod I with transfers and w/c mobility, supervision with gait  Transfer training, gait training using AFO, stair negotiation, functional balance, R NMR, hands-on family training, activity tolerance   Communication   Mod I-supervision with speech strategies at the sentence-conversational level  Mod I  increase use of speech strategies, education   Safety/Cognition/ Behavioral Observations  no unsafe behavior         Pain   no c/o pain  pain < 3  assess pain q shift    Skin    no skin breakdown  no skin breakdown this admission  assess skin q shift     Rehab Goals Patient on target to meet rehab goals: Yes *See Care Plan and progress notes for long and short-term goals.  Barriers to Discharge: needs to be mod I toleting    Possible Resolutions to Barriers:  cant rehab    Discharge Planning/Teaching Needs:  Home with daughter assisting with his care-to come in for family education tomorrow.  Prepare for discharge on Friday      Team Discussion:  Progressing toward discharge for Friday.  Family education tomorrow with daughter then will be ready for discharge.  Dietician to see to answer questions.  Restless leg better with meds  Revisions to Treatment Plan:  Ready for discharge Friday   Continued Need for Acute Rehabilitation Level of Care: The patient requires daily medical management by a physician with specialized training in physical medicine and rehabilitation for the following  conditions: Daily direction of a multidisciplinary physical rehabilitation program to ensure safe treatment while eliciting the highest outcome that is of practical value to the patient.: Yes Daily medical management of patient stability for increased activity during participation in an intensive rehabilitation regime.: Yes Daily analysis of laboratory values and/or radiology  reports with any subsequent need for medication adjustment of medical intervention for : Neurological problems;Other  Rebekka Lobello, Gardiner Rhyme 10/10/2013, 1:43 PM

## 2013-10-10 NOTE — Plan of Care (Signed)
Problem: RH Stairs Goal: LTG Patient will ambulate up and down stairs w/assist (PT) LTG: Patient will ambulate up and down # of stairs with assistance (PT)  Outcome: Not Applicable Date Met:  11/00/34 N/A, as patient requires mod A to negotiate 1/2 step to enter home. Therapist and pt in agreement that it would be safer for daughter to bump w/c up/down step.

## 2013-10-10 NOTE — Progress Notes (Deleted)
Social Work Patient ID: David Hall, male   DOB: 06-04-1956, 58 y.o.   MRN: [id number] Met with pt and wife to discuss team conference progression toward goals and discharge moving up to this Friday 3/6 after therapy. Both are pleased with this and requested this.  Discussed DME and follow up home health, no preference.  Wife feels comfortable with the care He requires at home and will attend therapies until his discharge date.

## 2013-10-10 NOTE — Progress Notes (Signed)
RD consulted for diet education. Per RN, pt was put on Heart Healthy diet today and pt has questions.   Pt simply asked RD why he can't have french fries. Discussed with pt the reasons why french fries are not on heart healthy diet. Pt says he will go back to eating french fries when he goes home. Encouraged pt to bake instead of fry. Pt refuses additional nutrition information at this time.  Please re-consult RD if pt has any additional questions.  Pryor Ochoa RD, LDN Inpatient Clinical Dietitian Pager: [phone] After Hours Pager: [phone]

## 2013-10-10 NOTE — Progress Notes (Signed)
Social Work [name], LCSW Social Worker Signed  Patient Care Conference Service date: 10/10/2013 1:43 PM  Inpatient RehabilitationTeam Conference and Plan of Care Update Date: 10/10/2013   Time: 10;30 AM     Patient Name: David Hall       Medical Record Number: [id number]   Date of Birth: May 26, 1956 Sex: Male         Room/Bed: 4M08C/4M08C-01 Payor Info: Payor: MEDICARE / Plan: MEDICARE PART A AND B / Product Type: *No Product type* /   Admitting Diagnosis: Frontal CVA   Admit Date/Time:  09/28/2013  2:54 PM Admission Comments: No comment available   Primary Diagnosis:  <principal problem not specified> Principal Problem: <principal problem not specified>    Patient Active Problem List     Diagnosis  Date Noted   .  CVA (cerebral infarction)  09/28/2013     Expected Discharge Date: Expected Discharge Date: 10/12/13  Team Members Present: Physician leading conference: Dr. Alysia Penna Social Worker Present: Alfonse Alpers, LCSW;Becky Gurneet Matarese, LCSW Nurse Present: Other (comment) Loree Fee Reardon-RN) PT Present: Georjean Mode, PT;Other (comment) Jilda Roche) OT Present: Antony Salmon, Roland Earl, OT SLP Present: Germain Osgood, SLP PPS Coordinator present : Ileana Ladd, Lelan Pons, RN, CRRN        Current Status/Progress  Goal  Weekly Team Focus   Medical     labile, resistance to recs  establish d/c rec  pt and family ed   Bowel/Bladder     cont. bowel and bladder, LBM 10/09/13  cont of bladder and bowel  remain cont of bladder and bowel   Swallow/Nutrition/ Hydration     regular textures and thin liquids with Mod I  Mod I with least restrictive PO  goal level met and education complete   ADL's     min assist bathing and grooming, setup/supervision tub transfer and UB dressing, steadying to sup standing balance during self-care tasks, min-sup toilet transfer  supervision shower transfer, and  LB dressing, Mod I toilet tranfers, toilet task,  and UB dressing, min assist grooming  and bathing  R NMR, functional transfers, safety awareness, standing balacnce, family education/training   Mobility     Close supervision with transfers, Min-Mod A with gait, Min A with stair negotiation, supervision with w/c mobility  Mod I with transfers and w/c mobility, supervision with gait  Transfer training, gait training using AFO, stair negotiation, functional balance, R NMR, hands-on family training, activity tolerance   Communication     Mod I-supervision with speech strategies at the sentence-conversational level  Mod I  increase use of speech strategies, education   Safety/Cognition/ Behavioral Observations    no unsafe behavior       Pain     no c/o pain  pain < 3  assess pain q shift    Skin     no skin breakdown  no skin breakdown this admission  assess skin q shift     Rehab Goals Patient on target to meet rehab goals: Yes *See Care Plan and progress notes for long and short-term goals.    Barriers to Discharge:  needs to be mod I toleting      Possible Resolutions to Barriers:    cant rehab      Discharge Planning/Teaching Needs:    Home with daughter assisting with his care-to come in for family education tomorrow.  Prepare for discharge on Friday      Team Discussion:    Progressing toward discharge for Friday.  Family education tomorrow with daughter then will be ready for discharge.  Dietician to see to answer questions.  Restless leg better with meds   Revisions to Treatment Plan:    Ready for discharge Friday    Continued Need for Acute Rehabilitation Level of Care: The patient requires daily medical management by a physician with specialized training in physical medicine and rehabilitation for the following conditions: Daily direction of a multidisciplinary physical rehabilitation program to ensure safe treatment while eliciting the highest outcome that is of practical value to the patient.: Yes Daily medical  management of patient stability for increased activity during participation in an intensive rehabilitation regime.: Yes Daily analysis of laboratory values and/or radiology reports with any subsequent need for medication adjustment of medical intervention for : Neurological problems;Other  [name] 10/10/2013, 1:43 PM         [name], LCSW Social Worker Signed  Patient Care Conference Service date: 10/03/2013 1:26 PM  Inpatient RehabilitationTeam Conference and Plan of Care Update Date: 10/03/2013   Time: 10;30 AM     Patient Name: David Hall       Medical Record Number: [id number]   Date of Birth: October 18, 1955 Sex: Male         Room/Bed: 4W07C/4W07C-01 Payor Info: Payor: MEDICARE / Plan: MEDICARE PART A AND B / Product Type: *No Product type* /   Admitting Diagnosis: Frontal CVA   Admit Date/Time:  09/28/2013  2:54 PM Admission Comments: No comment available   Primary Diagnosis:  <principal problem not specified> Principal Problem: <principal problem not specified>    Patient Active Problem List     Diagnosis  Date Noted   .  CVA (cerebral infarction)  09/28/2013     Expected Discharge Date: Expected Discharge Date: 10/12/13  Team Members Present: Physician leading conference: Dr. Alysia Penna Social Worker Present: Alfonse Alpers, LCSW;Becky Chiana Wamser, LCSW Nurse Present: Other (comment) Jearl Klinefelter) PT Present: Georjean Mode, PT;Other (comment) Jilda Roche) OT Present: Antony Salmon, Roland Earl, OT SLP Present: Germain Osgood, SLP PPS Coordinator present : Ileana Ladd, Lelan Pons, RN, CRRN        Current Status/Progress  Goal  Weekly Team Focus   Medical     low frustration tolerance  improve mood  neuropsych eval   Bowel/Bladder     Cont. Bowel and bladder, LBM 10/02/13  continent of bladder and bowel  Remain continent of bladder and bowel   Swallow/Nutrition/ Hydration     regular textures and thin liquids with  intermittent supervision  Mod I with least restrictive PO  diet tolerance, education   ADL's     min assist bathing, grooming, and lower body dressing, supervision UB dressing, mod assist functional transfers  supervision bathing, shower transfer, and  LB dressing, Mod I toilet tranfers, toilet task, and UB dressing  R NMR, safety awareness, functional transfers, standing balance, coordination, activity tolerance   Mobility     Min-Mod A with transfers, Max A with gait, +2A with stair negotiation, supervision with w/c mobility  Mod I with transfers and w/c mobility, supervision with gait  Transfer/gait training, standing balance, stair negotiation, R NMR, D/C planning, functional endurance   Communication     Min cues for speech strategies at the sentence-conversational level  Mod I  increase use of speech strategies   Safety/Cognition/ Behavioral Observations    No unsafe behaviors       Pain     No c/o pain   Pain <3  Assess for and treat pain q shift with prn pain meds   Skin     No skin breakdown  No skin breakdown  Assess skin q shift for any skin breakdown     *See Care Plan and progress notes for long and short-term goals.    Barriers to Discharge:  needs to be Mod/I toileting      Possible Resolutions to Barriers:    daughter caregiver      Discharge Planning/Teaching Needs:    Home with daughter who can provide assist to pt.  Pt feels he is doing well      Team Discussion:    Restless leg-MD working on.  Neuro-psych to see on Monday.  Making good progress.  Will need family education next week with daughter.   Revisions to Treatment Plan:    None    Continued Need for Acute Rehabilitation Level of Care: The patient requires daily medical management by a physician with specialized training in physical medicine and rehabilitation for the following conditions: Daily direction of a multidisciplinary physical rehabilitation program to ensure safe treatment while eliciting the  highest outcome that is of practical value to the patient.: Yes Daily medical management of patient stability for increased activity during participation in an intensive rehabilitation regime.: Yes Daily analysis of laboratory values and/or radiology reports with any subsequent need for medication adjustment of medical intervention for : Neurological problems  [name] 10/03/2013, 1:26 PM          Patient ID: Lillia Pauls, male   DOB: 1955-10-30, 58 y.o.   MRN: [id number]

## 2013-10-11 ENCOUNTER — Inpatient Hospital Stay (HOSPITAL_COMMUNITY): Payer: Medicare Other

## 2013-10-11 ENCOUNTER — Inpatient Hospital Stay (HOSPITAL_COMMUNITY): Payer: Medicare Other | Admitting: Physical Therapy

## 2013-10-11 DIAGNOSIS — F319 Bipolar disorder, unspecified: Secondary | ICD-10-CM | POA: Diagnosis not present

## 2013-10-11 DIAGNOSIS — E785 Hyperlipidemia, unspecified: Secondary | ICD-10-CM | POA: Diagnosis present

## 2013-10-11 DIAGNOSIS — I6350 Cerebral infarction due to unspecified occlusion or stenosis of unspecified cerebral artery: Secondary | ICD-10-CM | POA: Diagnosis not present

## 2013-10-11 DIAGNOSIS — I10 Essential (primary) hypertension: Secondary | ICD-10-CM | POA: Insufficient documentation

## 2013-10-11 DIAGNOSIS — J209 Acute bronchitis, unspecified: Secondary | ICD-10-CM | POA: Diagnosis not present

## 2013-10-11 LAB — CBC
HCT: 34.3 % — ABNORMAL LOW (ref 39.0–52.0)
Hemoglobin: 11.7 g/dL — ABNORMAL LOW (ref 13.0–17.0)
MCH: 29.2 pg (ref 26.0–34.0)
MCHC: 34.1 g/dL (ref 30.0–36.0)
MCV: 85.5 fL (ref 78.0–100.0)
Platelets: 284 10*3/uL (ref 150–400)
RBC: 4.01 MIL/uL — ABNORMAL LOW (ref 4.22–5.81)
RDW: 13.1 % (ref 11.5–15.5)
WBC: 8.5 10*3/uL (ref 4.0–10.5)

## 2013-10-11 MED ORDER — OMEPRAZOLE 20 MG PO CPDR: 20.0000 mg | DELAYED_RELEASE_CAPSULE | Freq: Every day | ORAL | Status: DC

## 2013-10-11 MED ORDER — METOPROLOL TARTRATE 100 MG PO TABS: 100.0000 mg | ORAL_TABLET | Freq: Two times a day (BID) | ORAL | Status: DC

## 2013-10-11 MED ORDER — NITROGLYCERIN 0.3 MG SL SUBL: 0.3000 mg | SUBLINGUAL_TABLET | SUBLINGUAL | Status: DC | PRN

## 2013-10-11 MED ORDER — ISOSORBIDE MONONITRATE ER 60 MG PO TB24: 60.0000 mg | ORAL_TABLET | Freq: Every day | ORAL | Status: DC

## 2013-10-11 MED ORDER — FLUTICASONE PROPIONATE HFA 44 MCG/ACT IN AERO: 1.0000 | INHALATION_SPRAY | Freq: Two times a day (BID) | RESPIRATORY_TRACT | Status: DC

## 2013-10-11 MED ORDER — CARBAMAZEPINE 200 MG PO TABS: ORAL_TABLET | ORAL | Status: DC

## 2013-10-11 MED ORDER — AMLODIPINE BESYLATE 5 MG PO TABS
5.0000 mg | ORAL_TABLET | Freq: Every day | ORAL | Status: DC
  Administered 2013-10-11: 5 mg via ORAL
  Filled 2013-10-11 (×2): qty 1

## 2013-10-11 MED ORDER — TRAMADOL HCL 50 MG PO TABS: 50.0000 mg | ORAL_TABLET | Freq: Four times a day (QID) | ORAL | Status: DC | PRN

## 2013-10-11 MED ORDER — LISINOPRIL 20 MG PO TABS: 20.0000 mg | ORAL_TABLET | Freq: Two times a day (BID) | ORAL | Status: DC

## 2013-10-11 MED ORDER — ATORVASTATIN CALCIUM 40 MG PO TABS: 40.0000 mg | ORAL_TABLET | Freq: Every day | ORAL | Status: DC

## 2013-10-11 MED ORDER — AMLODIPINE BESYLATE 5 MG PO TABS: 5.0000 mg | ORAL_TABLET | Freq: Every day | ORAL | Status: DC

## 2013-10-11 MED ORDER — METOPROLOL TARTRATE 100 MG PO TABS
100.0000 mg | ORAL_TABLET | Freq: Two times a day (BID) | ORAL | Status: DC
  Administered 2013-10-11: 100 mg via ORAL
  Filled 2013-10-11 (×3): qty 1

## 2013-10-11 MED ORDER — ASPIRIN 325 MG PO TBEC: 325.0000 mg | DELAYED_RELEASE_TABLET | Freq: Every day | ORAL | Status: DC

## 2013-10-11 MED ORDER — FLUOXETINE HCL 20 MG PO CAPS: 20.0000 mg | ORAL_CAPSULE | Freq: Every day | ORAL | Status: DC

## 2013-10-11 MED ORDER — ROPINIROLE HCL 0.25 MG PO TABS: 0.2500 mg | ORAL_TABLET | Freq: Every day | ORAL | Status: DC

## 2013-10-11 MED ORDER — DIAZEPAM 10 MG PO TABS: 10.0000 mg | ORAL_TABLET | Freq: Two times a day (BID) | ORAL | Status: DC

## 2013-10-11 NOTE — Plan of Care (Signed)
Problem: RH Bathing Goal: LTG Patient will bathe with assist, cues/equipment (OT) LTG: Patient will bathe specified number of body parts with assist with/without cues using equipment (position) (OT)  Outcome: Not Met (add Reason) Patient requires assistance to wash LUE secondary to LUE hemiplegia

## 2013-10-11 NOTE — Progress Notes (Signed)
Patient discharged home.  Left floor via wheelchair, escorted by nursing staff and daughter.  Patient and daughter verbalized understanding of discharge instructions, no questions asked.  VSS.  Appears to be in no immediate distress at this time.  Brita Romp, RN

## 2013-10-11 NOTE — Progress Notes (Signed)
Social Work Patient ID: David Hall, male   DOB: May 03, 1956, 58 y.o.   MRN: [id number] Met with pt has spoke with daughter and she is on her way to go through therapies with pt.  He is relieved and aware team anticipates her not always being with him at home, so mod/i w/c level and if ambulating min assist.  He is aware and really wants to go home tomorrow.  See how education goes.

## 2013-10-11 NOTE — Discharge Instructions (Signed)
Inpatient Rehab Discharge Instructions  David Hall Discharge date and time: No discharge date for patient encounter.   Activities/Precautions/ Functional Status: Activity: activity as tolerated Diet: regular diet Wound Care: none needed Functional status:  ___ No restrictions     ___ Walk up steps independently _x__ 24/7 supervision/assistance   ___ Walk up steps with assistance ___ Intermittent supervision/assistance  ___ Bathe/dress independently ___ Walk with walker     ___ Bathe/dress with assistance ___ Walk Independently    ___ Shower independently _x__ Walk with assistance    ___ Shower with assistance ___ No alcohol     ___ Return to work/school ________  Special Instructions:  Followup CT of the chest as outpatient for evaluation of lung nodule   COMMUNITY REFERRALS UPON DISCHARGE:    Home Health:   PT,OT,RN   Agency:[facility] FBPZW:[phone] Date of last service:10/11/2013  Medical Equipment/Items Ordered:WHEELCHAIR, TUB BENCH, [place] RESOURCES FOR PATIENT/FAMILY: Support Groups:[facility] SUPPORT GROUP  My questions have been answered and I understand these instructions. I will adhere to these goals and the provided educational materials after my discharge from the hospital.  Patient/Caregiver Signature _______________________________ Date __________  Clinician Signature _______________________________________ Date __________  Please bring this form and your medication list with you to all your follow-up doctor's appointments.   STROKE/TIA DISCHARGE INSTRUCTIONS SMOKING Cigarette smoking nearly doubles your risk of having a stroke & is the single most alterable risk factor  If you smoke or have smoked in the last 12 months, you are advised to quit smoking for your health.  Most of the excess cardiovascular risk related to smoking disappears within a year of stopping.  Ask you  doctor about anti-smoking medications  [place] Quit Line: 1-800-QUIT NOW  Free Smoking Cessation Classes [phone]  CHOLESTEROL Know your levels; limit fat & cholesterol in your diet  Lipid Panel  No results found for this basename: chol, trig, hdl, cholhdl, vldl, ldlcalc      Many patients benefit from treatment even if their cholesterol is at goal.  Goal: Total Cholesterol (CHOL) less than 160  Goal:  Triglycerides (TRIG) less than 150  Goal:  HDL greater than 40  Goal:  LDL (LDLCALC) less than 100   BLOOD PRESSURE American Stroke Association blood pressure target is less that 120/80 mm/Hg  Your discharge blood pressure is:  BP: 189/79 mmHg  Monitor your blood pressure  Limit your salt and alcohol intake  Many individuals will require more than one medication for high blood pressure  DIABETES (A1c is a blood sugar average for last 3 months) Goal HGBA1c is under 7% (HBGA1c is blood sugar average for last 3 months)  Diabetes: No known diagnosis of diabetes    Lab Results  Component Value Date   HGBA1C 5.4 09/29/2013     Your HGBA1c can be lowered with medications, healthy diet, and exercise.  Check your blood sugar as directed by your physician  Call your physician if you experience unexplained or low blood sugars.  PHYSICAL ACTIVITY/REHABILITATION Goal is 30 minutes at least 4 days per week  Activity: No driving, Therapies: Physical Therapy: Home Health Return to work:   Activity decreases your risk of heart attack and stroke and makes your heart stronger.  It helps control your weight and blood pressure; helps you relax and can improve your mood.  Participate in a regular exercise program.  Talk with your doctor about the best form of  exercise for you (dancing, walking, swimming, cycling).  DIET/WEIGHT Goal is to maintain a healthy weight  Your discharge diet is: Cardiac  liquids Your height is:    Your current weight is: Weight: 90.3 kg (199 lb 1.2 oz) Your  Body Mass Index (BMI) is:     Following the type of diet specifically designed for you will help prevent another stroke.  Your goal weight range is:    Your goal Body Mass Index (BMI) is 19-24.  Healthy food habits can help reduce 3 risk factors for stroke:  High cholesterol, hypertension, and excess weight.  RESOURCES Stroke/Support Group:  Call [phone]   STROKE EDUCATION PROVIDED/REVIEWED AND GIVEN TO PATIENT Stroke warning signs and symptoms How to activate emergency medical system (call 911). Medications prescribed at discharge. Need for follow-up after discharge. Personal risk factors for stroke. Pneumonia vaccine given:  Flu vaccine given:  My questions have been answered, the writing is legible, and I understand these instructions.  I will adhere to these goals & educational materials that have been provided to me after my discharge from the hospital.

## 2013-10-11 NOTE — Discharge Summary (Signed)
Physician Discharge Summary  Patient ID: David Hall MRN: [id number] DOB/AGE: [id number] y.o.  Admit date: 09/28/2013 Discharge date: 10/11/2013  Discharge Diagnoses:  Principal Problem:   CVA (cerebral infarction) Active Problems:   HTN (hypertension)   Dyslipidemia   Acute bronchitis   Bipolar disorder, unspecified   Discharged Condition: Stable  Significant Diagnostic Studies: Dg Chest 2 View  10/09/2013   CLINICAL DATA:  Dyspnea  EXAM: CHEST  2 VIEW  COMPARISON:  09/26/2013  FINDINGS: An ill-defined nodule persists at the the left base, indistinct appearance making size estimation difficult. The nodule correlates with abnormality seen on previous chest CT. No cardiomegaly. Unchanged upper mediastinal contours. No edema, consolidation, effusion, or pneumothorax.  IMPRESSION: 1. No edema or consolidation. 2. Persistent nodule at the left base, reference chest CT 09/20/2013.   Electronically Signed   By: Jorje Guild M.D.   On: 10/09/2013 05:52    Labs:  Basic Metabolic Panel:  Recent Labs Lab 10/05/13 0458 10/08/13 1218  NA  --  133*  K  --  4.4  CL  --  94*  CO2  --  25  GLUCOSE  --  79  BUN  --  14  CREATININE 0.94 1.20  CALCIUM  --  8.8    CBC:  Recent Labs Lab 10/08/13 1218 10/11/13 0500  WBC 17.2* 8.5  HGB 11.3* 11.7*  HCT 33.0* 34.3*  MCV 88.7 85.5  PLT 283 284    CBG: No results found for this basename: GLUCAP,  in the last 168 hours   Brief HPI:   [name] is a 58 year old male with history of HTN, bipolar disorder, Crack cocaine use who was admitted on 09/26/13 with right sided weakness and slurred speech. UDS positive for cocaine (reported to have used 2 days PTA) and benzos. MRI brain with acute lacunar infarct left corona radiata involving lentiform neucleus as well as IC and small subcortical infarct left anterior frontal lobe.  Patient  with resultant right sided weakness, right facial droop and mild dysarthria. He was  placed on ASA for secondary stroke prevention.  He was admitted to [facility] for progressive therapies.    Hospital Course: David Hall was admitted to rehab 09/28/2013 for inpatient therapies to consist of PT, ST and OT at least three hours five days a week. Past admission physiatrist, therapy team and rehab RN have worked together to provide customized collaborative inpatient rehab. His mod has been stable and he has shown good motivation. Blood pressures were monitored on bid basis and medications were titrated for better control. Systolic blood pressures have ranged from 140-180 range.  Requip was added to help with RLS. He developed bronchitis flare and was treated with levaquin x a week. Chest x ray showed NAD and persistent LLL nodule. Check of lytes show mild hyponatremia that is stable on HCTZ and follow up CBC showed that leucocytosis has resolved. He has been educated on heart healthy diet as well as tobacco cessation.  He has made good progress and is independent at wheelchair level. He will follow up with primary MD for resumption of lung nodule workup.    Rehab course: During patient's stay in rehab weekly team conferences were held to monitor patient's progress, set goals and discuss barriers to discharge.Patient has had improvement in activity tolerance, balance, postural control, as well as ability to compensate for deficits. He is has had improvement in functional use RUE and RLE as well as improved awareness. He  requires min assist for LB bathing and dressing tasks. He requires supervision for tub transfers. He is modified independent for squat pivot transfers and is able to navigate WC for 200 feet in controlled environment. He is able to ambulate 64' in controlled environment with RW, R-hand orthosis with min assist.    Disposition: 01-Home or Self Care  Diet: Heart Healthy.   Special Instructions: 1. [facility] to provide PT, OT, RN   Future Appointments Provider  Department Dept Phone   11/13/2013 10:00 AM Charlett Blake, MD [facility] [place] [phone]       Medication List    STOP taking these medications       diphenhydrAMINE 25 MG tablet  Commonly known as:  SOMINEX     zolpidem 10 MG tablet  Commonly known as:  AMBIEN      TAKE these medications       amLODipine 5 MG tablet  Commonly known as:  NORVASC  Take 1 tablet (5 mg total) by mouth daily.     aspirin 325 MG EC tablet  Take 1 tablet (325 mg total) by mouth daily.     atorvastatin 40 MG tablet  Commonly known as:  LIPITOR  Take 1 tablet (40 mg total) by mouth daily.     carbamazepine 200 MG tablet  Commonly known as:  TEGRETOL  200 mg daily and 400 mg each bedtime     diazepam 10 MG tablet  Commonly known as:  VALIUM  Take 1 tablet (10 mg total) by mouth 2 (two) times daily.     FLUoxetine 20 MG capsule  Commonly known as:  PROZAC  Take 1 capsule (20 mg total) by mouth daily.     fluticasone 44 MCG/ACT inhaler  Commonly known as:  FLOVENT HFA  Inhale 1 puff into the lungs 2 (two) times daily.     isosorbide mononitrate 60 MG 24 hr tablet  Commonly known as:  IMDUR  Take 1 tablet (60 mg total) by mouth daily.     lisinopril 20 MG tablet  Commonly known as:  PRINIVIL,ZESTRIL  Take 1 tablet (20 mg total) by mouth 2 (two) times daily.     lisinopril-hydrochlorothiazide 20-12.5 MG per tablet  Commonly known as:  PRINZIDE,ZESTORETIC  Take 1 tablet by mouth daily.     metoprolol 100 MG tablet  Commonly known as:  LOPRESSOR  Take 1 tablet (100 mg total) by mouth 2 (two) times daily.     nitroGLYCERIN 0.3 MG SL tablet  Commonly known as:  NITROSTAT  Place 1 tablet (0.3 mg total) under the tongue every 5 (five) minutes as needed for chest pain.     omeprazole 20 MG capsule  Commonly known as:  PRILOSEC  Take 1 capsule (20 mg total) by mouth daily.     rOPINIRole 0.25 MG tablet  Commonly known as:  REQUIP  Take 1 tablet (0.25 mg total)  by mouth at bedtime.     traMADol 50 MG tablet  Commonly known as:  ULTRAM  Take 1 tablet (50 mg total) by mouth 4 (four) times daily as needed for moderate pain.     VITAMIN B 12 PO  Take 1 tablet by mouth daily.           Follow-up Information   Follow up with Charlett Blake, MD. (Be there at  9;30 am for 9:50 am  appointment)    Specialty:  Physical Medicine and Rehabilitation   Contact information:  [address] [place] [address] [phone]       Follow up On 11/13/2013.      Follow up with Raye Sorrow, MD On 10/26/2013. (APPT @ 1:30 PM)    Specialty:  Family Medicine   Contact information:   [place] [address] [phone]       Signed: Bary Leriche 10/11/2013, 4:37 PM

## 2013-10-11 NOTE — Progress Notes (Signed)
Recreational Therapy Discharge Summary Patient Details  Name: David Hall MRN: [id number] Date of Birth: 10-14-1955 Today's Date: 10/11/2013  Long term goals set: 1  Long term goals met: 1  Comments on progress toward goals: Pt is discharging home today with daughter at overall Mod I w/c level.  Session focused on use of leisure time post discharge & safety with community pursuit.  Pt stated understanding. Reasons for discharge: discharge from hospital Patient/family agrees with progress made and goals achieved: Yes  Anberlin Diez 10/11/2013, 2:45 PM

## 2013-10-11 NOTE — Progress Notes (Signed)
Occupational Therapy Discharge Summary  Patient Details  Name: David Hall MRN: [id number] Date of Birth: [birth year]  Today's Date: 10/11/2013 Time: 4827-0786 and 7544-9201 Time Calculation (min): 58 min and 26 min   Patient has met 10 of 11 long term goals due to improved activity tolerance, improved balance, postural control, ability to compensate for deficits, improved attention and improved awareness.  Patient to discharge at overall modified independent level toilet task, toilet transfer, UB dressing, min assist LB dressing and bathing, supervision level for tub transfer.  Patient's care partner is independent to provide the necessary physical and cognitive assistance at discharge.  Patient's daughter participated in family training and education. Daughter informed of therapist's recommendation of intermittent supervision from w/c level.   Reasons goals not met: Patient did not meet bathing goal as he requires min assist for washing LUE due to weakness in RUE. Daughter educated and assisted with this.    Recommendation:  Patient will benefit from ongoing skilled OT services in outpatient setting to continue to advance functional skills in the area of BADLs, functional use of RUE, postural contol in standing, and safety awareness.  Equipment: TTB  Reasons for discharge: treatment goals met and discharge from hospital  Patient/family agrees with progress made and goals achieved: Yes  Skilled Therapeutic Intervention Session 1: Pt seen for ADL retraining with focus on family training/education, functional transfers, standing balance, and safety awareness. Pt's daughter present for therapy session and actively participated throughout. Pt completed shower this AM in tub shower requiring supervision for transfer with min verbal cues for technique. Pt completed transfer with therapist and daughter. Completed bathing with min assist for washing L shoulder that daughter assisted with.  Daughter also provided close supervision for standing balance during buttocks hygiene and clothing management around waist. Completed dressing with mod I for UB and increased time and daughter providing min assist for donning R shoe with AFO and fastening shoes. Pt completed squat pivot toilet transfer and task at mod I left, demonstrating good awareness of RUE/RLE and positioning of w/c. Discussed with daughter about assisting pt with all meal tasks and she was agreeable of this. Discussed setup of kitchen to allow pt to reach items from w/c level as well. Daughter very attentive during therapy session and no questions or concerns about discharge at this time.  Session 2: Pt seen for 1:1 OT session with focus on functional transfers from new w/c. Pt's daughter present during therapy session. Provided orientation of w/c (locks, lap tray, leg rests). Pt completed squat pivot transfer at Mod I level w/c>new w/c. Pt ambulated to bathroom with less time using new w/c. Completed squat pivot toilet transfer 2x at Mod I level. Pt propelled self back to room with daughter. Daughter inquiring about setup of TTB. Provided education using TTB in room and she reported understanding how to set up. Pt and daughter with no questions or concerns about discharge at this time.  OT Discharge Precautions/Restrictions  Precautions Precautions: Fall Restrictions Weight Bearing Restrictions: No General   Vital Signs   Pain Pain Assessment Pain Assessment: No/denies pain Pain Score: 4  ADL ADL ADL Comments: see FIM Vision/Perception  Vision - History Baseline Vision: Wears glasses all the time Patient Visual Report: No change from baseline Vision - Assessment Eye Alignment: Within Functional Limits Perception Perception: Within Functional Limits Praxis Praxis: Intact  Cognition Overall Cognitive Status: Within Functional Limits for tasks assessed Arousal/Alertness: Awake/alert Orientation Level: Oriented  X4 Attention: Alternating Alternating Attention: Appears intact Memory:  Appears intact Awareness: Appears intact Problem Solving: Appears intact Behaviors: Impulsive Safety/Judgment: Appears intact Sensation Sensation Light Touch: Appears Intact Coordination Gross Motor Movements are Fluid and Coordinated: No Fine Motor Movements are Fluid and Coordinated: No (RUE/RLE hemiplegia) Motor    Mobility     Trunk/Postural Assessment     Balance   Extremity/Trunk Assessment      See FIM for current functional status  David Hall, David Hall 10/11/2013, 12:32 PM

## 2013-10-11 NOTE — Progress Notes (Signed)
Social Work Discharge Note Discharge Note  The overall goal for the admission was met for:   Discharge location: Yes-HOME WITH DAUGHTER WHO IS IN&OUT  Length of Stay: Yes-14 DAYS  Discharge activity level: Yes-MOD/I W/C LEVEL & MIN-AMBULATION WILL STAY IN [place]  Home/community participation: Yes  Services provided included: MD, RD, PT, OT, SLP, RN, CM, TR, Pharmacy, Neuropsych and SW  Financial Services: Medicare and Medicaid  Follow-up services arranged: Home Health: [place], DME: [facility]-WHEELCHAIR & TUB BENCH and Patient/Family has no preference for HH/DME agencies  Comments (or additional information):FAMILY EDUCATION COMPLETED WITH DAUGHTER WHEN FINALLY ARRIVED AND NOW WANTS TO [place] DUE TO FEELS DAUGHTER WILL NOT COME OUT IN THE [place].  PA AWARE AND MD CONTACTED.  FEELS COMFORTABLE WITH HIS CARE.  CANCELLED ROLLING WALKER CAN GET ONE FORM HIS COUSIN.  Patient/Family verbalized understanding of follow-up arrangements: Yes  Individual responsible for coordination of the follow-up plan: SELF  Confirmed correct DME delivered: [name] 10/11/2013    [name]

## 2013-10-11 NOTE — Progress Notes (Signed)
Speech Language Pathology Discharge Summary & Final Treatment Note  Patient Details  Name: MAURICE FOTHERINGHAM MRN: [id number] Date of Birth: June 29, 1956  Today's Date: 10/11/2013 Time: 0830-0900 Time Calculation (min): 30 min  Skilled Therapeutic Interventions:  Treatment focused on education with no family or caregivers present. SLP facilitated session with education regarding current functional status and recommendations. Pt verbalized his understanding and participated in functional conversation regarding discharge planning while using his strategies with Mod I. Pt briefly spoke on the phone without having to repeat himself to his communication partner on the other end of the line.     Patient has met 2 of 2 long term goals.  Patient to discharge at overall Modified Independent level.  Reasons goals not met: N/A   Clinical Impression/Discharge Summary: Pt has met 2 out of 2 LTGs during this admission due to increased swallowing and communication function. He is tolerating a regular diet and thin liquids with Mod I, and is using his speech intelligibility strategies at the sentence-conversational level with Mod I. His speech remains mildly slurred, though intelligible. He will benefit from additional OP SLP services to maximize functional communication. No further f/u recommended for swallowing.   Care Partner:  Caregiver Able to Provide Assistance: Other (comment) (N/A - Mod I)     Recommendation:  Outpatient SLP  Rationale for SLP Follow Up: Maximize functional communication   Equipment: N/A   Reasons for discharge: Treatment goals met;Discharged from hospital   Patient/Family Agrees with Progress Made and Goals Achieved: Yes   See FIM for current functional status  Germain Osgood, M.A. CCC-SLP [phone]  Germain Osgood 10/11/2013, 10:26 AM

## 2013-10-11 NOTE — Progress Notes (Addendum)
Physical Therapy Discharge Summary  Patient Details  Name: David Hall MRN: [id number] Date of Birth: 28-Jul-1956  Today's Date: 10/11/2013 Time: 0900-1012 Time Calculation (min): 72 min  Patient has met 13 of 13 long term goals due to improved activity tolerance, improved balance, improved postural control, increased strength, increased range of motion, ability to compensate for deficits, functional use of  right lower extremity and improved safety awareness.  Patient to discharge at a wheelchair level Modified Independent.   Patient's care partner is independent to provide the necessary physical assistance at discharge.  Reasons goals not met: N/A; all goals met.  Recommendation:  Patient will benefit from ongoing skilled PT services in outpatient setting to continue to advance safe functional mobility, address ongoing impairments in stability/independence with functional mobility and ambulation and minimize fall risk.  Equipment: w/c (18"x18") and cushion; lap tray; rolling walker; right hand orthosis; right ankle-foot orthosis  Reasons for discharge: treatment goals met and discharge from hospital  Patient/family agrees with progress made and goals achieved: Yes  Pt received seated EOB finishing breakfast. Pt agreeable to session but very upset by daughter's absence for scheduled family education. Session focused on fall recovery, floor transfers, and hands-on family training. Supine<>sit with mod l; HOB flat, no rail. Seated EOB, donned pants, shirt, R AFO. Squat pivot from bed<>w/c with mod I. Pt performed w/c mobility x200' in controlled environment with L hemi technique, mod I.    In rehab apartment, therapist verbally explained, demonstrated fall recovery strategy, floor transfer using couch. Pt performed floor transfer with min A, increased time; min cueing for energy conservation with effective pt carryover. After seated rest break, pt performed gait x30' in controlled and  home environments with rolling walker, R hand orthosis, min A; manual facilitation of bilat weight shifting.  Negotiated 5 stairs with LUE support at L hand rail; forward-facing with step-to pattern and min A, min verbal cueing for technique with effective pt carryover.  Daughter arrived with 18 minutes remaining in scheduled session. Therapist spent 30 minutes total providing education and hands-on training to daughter. Therapist, pt, and daughter engaged in interactive discussion of pt's physical impairments and assistance required with all aspects of functional mobility. PT explained that pt was mod I with w/c mobility and level transfers and would require assist with all other mobility, including transfers to surfaces higher than w/c. Therapist reiterated importance of pt ambulating only with assistance of a physical/occupational therapist after D/C; pt and daughter verbalized understanding and were in full agreement. Therapist explained/demonstrated the following: how to break down w/c, rolling walker; technique for bumping w/c up/down 4" step to enter home; stand pivot car transfer with rolling walker, R hand orthosis, min A. Daughter gave effective return demonstration of all said aspects of functional mobility. Session ended in pt room, where pt was left seated in w/c with all needs within reach and daughter present.  PT Discharge Precautions/Restrictions  Fall Pain  Pt reports no pain during session. Cognition Overall Cognitive Status: Within Functional Limits for tasks assessed Arousal/Alertness: Awake/alert Orientation Level: Oriented X4 Attention: Alternating Alternating Attention: Appears intact Memory: Appears intact Awareness: Appears intact Problem Solving: Appears intact Behaviors: Impulsive Safety/Judgment: Appears intact Sensation Sensation Light Touch: Appears Intact Coordination Gross Motor Movements are Fluid and Coordinated: No Fine Motor Movements are Fluid and  Coordinated: No (R hemiplegia) Motor  Motor Motor: Hemiplegia Motor - Discharge Observations: R hemiplegia  Mobility Bed Mobility Bed Mobility: Rolling Right;Rolling Left;Supine to Sit;Sitting - Scoot to [place] of  Bed;Sit to Supine;Scooting to [facility] Rolling Right: 6: Modified independent (Device/Increase time) Rolling Left: 6: Modified independent (Device/Increase time) Supine to Sit: 6: Modified independent (Device/Increase time) Sit to Supine: 6: Modified independent (Device/Increase time) Scooting to [facility]: 6: Modified independent (Device/Increase time) Transfers Transfers: Yes Sit to Stand: 4: Min guard Stand to Sit: 4: Min guard Stand Pivot Transfers: 4: Min guard;With armrests Squat Pivot Transfers: 6: Modified independent (Device/Increase time);With armrests Locomotion  Ambulation Ambulation: Yes Ambulation/Gait Assistance: 4: Min assist Ambulation Distance (Feet): 30 Feet Assistive device: Rolling walker;Other (Comment) (R hand orthosis) Ambulation/Gait Assistance Details: Manual facilitation for weight shifting;Tactile cues for posture Gait Gait: Yes Gait Pattern: Impaired Gait Pattern: Decreased step length - left;Step-through pattern;Decreased stance time - right;Decreased dorsiflexion - right;Decreased weight shift to right;Decreased weight shift to left;Right flexed knee in stance Gait velocity: Less than normal Stairs / Additional Locomotion Stairs: Yes Stairs Assistance: 4: Min assist Stair Management Technique: One rail Left;Step to pattern;Forwards Number of Stairs: 5 Architect: Yes Wheelchair Assistance: 6: Modified independent (Device/Increase time) Wheelchair Propulsion: Left upper extremity;Left lower extremity Wheelchair Parts Management: Independent Distance: 150  Trunk/Postural Assessment  Cervical Assessment Cervical Assessment: Within Functional Limits Thoracic Assessment Thoracic Assessment: Within Functional  Limits Lumbar Assessment Lumbar Assessment: Within Functional Limits Postural Control Postural Control: Within Functional Limits  Balance Balance Balance Assessed: Yes Static Sitting Balance Static Sitting - Balance Support: Feet supported;Left upper extremity supported Static Sitting - Level of Assistance: 7: Independent Dynamic Sitting Balance Dynamic Sitting - Balance Support: Feet supported;Left upper extremity supported Dynamic Sitting - Level of Assistance: 6: Modified independent (Device/Increase time) Dynamic Sitting - Balance Activities: Lateral lean/weight shifting;Forward lean/weight shifting;Reaching for objects Sitting balance - Comments: Removing bilat w/c leg rests while sitting in w/c; mod I. RUE supported at lap tray. Static Standing Balance Static Standing - Balance Support: Bilateral upper extremity supported Static Standing - Level of Assistance: 5: Stand by assistance;4: Min assist Static Standing - Comment/# of Minutes: Static standing with bilat UE support at rolling walker (R hand orthosis) >1 minute with close supervision to min guard; no LOB. Dynamic Standing Balance Dynamic Standing - Balance Support: Bilateral upper extremity supported;During functional activity Dynamic Standing - Level of Assistance: 4: Min assist Extremity Assessment  RUE Assessment RUE Assessment: Exceptions to [facility] (minimal shoulder flexion and extension; good shoulder adduction; minimal elbow flexion; good elbow extension) RUE Strength RUE Overall Strength: Deficits LUE Assessment LUE Assessment: Within Functional Limits RLE Assessment RLE Assessment: Exceptions to [facility] RLE Strength RLE Overall Strength: Deficits RLE Overall Strength Comments: 4-/5 hip extension and ankle plantarflexion; 3+/5 knee flexion; 3/5 hip flexion; 2+/5 knee extension;1/5 ankle dorsiflexion. LLE Assessment LLE Assessment: Within Functional Limits  See FIM for current functional status  David Hall, Malva Cogan 10/11/2013, 6:10 PM

## 2013-10-11 NOTE — Progress Notes (Signed)
Subjective/Complaints: Cannot contact daughter, wants to talk to SW, pt upset "you will never control my BP" A 12 point review of systems has been performed and if not noted above is otherwise negative.   Objective: Vital Signs: Blood pressure 189/79, pulse 57, temperature 97.5 F (36.4 C), temperature source Oral, resp. rate 18, weight 90.3 kg (199 lb 1.2 oz), SpO2 93.00%. No results found.  Recent Labs  10/08/13 1218 10/11/13 0500  WBC 17.2* 8.5  HGB 11.3* 11.7*  HCT 33.0* 34.3*  PLT 283 284    Recent Labs  10/08/13 1218  NA 133*  K 4.4  CL 94*  GLUCOSE 79  BUN 14  CREATININE 1.20  CALCIUM 8.8   CBG (last 3)  No results found for this basename: GLUCAP,  in the last 72 hours  Wt Readings from Last 3 Encounters:  10/10/13 90.3 kg (199 lb 1.2 oz)  09/28/13 89.812 kg (198 lb)    Physical Exam:  Constitutional: . He appears well-developed and well-nourished.  HENT:  Head: Normocephalic and atraumatic. Ear ring left ear  Eyes: Conjunctivae are normal. Pupils are equal, round, and reactive to light.  Neck: Normal range of motion. Neck supple.  Cardiovascular: Normal rate and regular rhythm. No murmurs  Respiratory: Effort normal and breath sounds normal. No respiratory distress. He has occ wheezes. No rales  GI: Soft. Bowel sounds are normal. He exhibits no distension.  no tenderness.  Musculoskeletal: He exhibits trace edema RUE.[name]  Neurological: exam unchanged: He is alert and oriented to person, place, and time.  Mild dysarthria with occasional word finding difficulties. . Mild facial weakness with minimal tongue deviation. Follows basic commands without difficulty. Right hemiparesis: RUE 0/5 deltoid, bicep,2- tricep,0 HI. RLE 2- HF, 3- KE, 1+ ADF and APF. Sensation appears grossly intact RUE and RLE. DTR's 2+ Right arm and leg. 1+ Left side. Reasonable insight and awareness.  Skin: Skin is warm and dry.  Psych: no lability   Assessment/Plan: 1.  Functional deficits secondary to left lacunar CR, BG, and IC infarcts, left subcortical frontal infarct which require 3+ hours per day of interdisciplinary therapy in a comprehensive inpatient rehab setting. Physiatrist is providing close team supervision and 24 hour management of active medical problems listed below. Physiatrist and rehab team continue to assess barriers to discharge/monitor patient progress toward functional and medical goals. FIM: FIM - Bathing Bathing Steps Patient Completed: Chest;Abdomen;Front perineal area;Buttocks;Right upper leg;Left upper leg;Right lower leg (including foot);Left lower leg (including foot);Right Arm Bathing: 4: Min-Patient completes 8-9 50f 10 parts or 75+ percent  FIM - Upper Body Dressing/Undressing Upper body dressing/undressing steps patient completed: Thread/unthread left sleeve of pullover shirt/dress;Put head through opening of pull over shirt/dress;Pull shirt over trunk;Thread/unthread right sleeve of pullover shirt/dresss Upper body dressing/undressing: 5: Set-up assist to: Obtain clothing/put away FIM - Lower Body Dressing/Undressing Lower body dressing/undressing steps patient completed: Don/Doff right sock;Don/Doff left sock;Thread/unthread right pants leg;Thread/unthread left pants leg;Pull pants up/down;Don/Doff left shoe Lower body dressing/undressing: 4: Min-Patient completed 75 plus % of tasks  FIM - Toileting Toileting steps completed by patient: Adjust clothing prior to toileting;Performs perineal hygiene;Adjust clothing after toileting Toileting Assistive Devices: Grab bar or rail for support Toileting: 6: Assistive device: No helper  FIM - Radio producer Devices: Grab bars Toilet Transfers: 4-From toilet/BSC: Min A (steadying Pt. > 75%)  FIM - Bed/Chair Transfer Bed/Chair Transfer Assistive Devices: Arm rests;Orthosis (R AFO) Bed/Chair Transfer: 6: Sit > Supine: No  assist;6: Chair or W/C > Bed: No  assist;5: Bed > Chair or W/C: Supervision (verbal cues/safety issues)  FIM - Locomotion: Wheelchair Distance: 200 Locomotion: Wheelchair: 6: Travels 150 ft or more, turns around, maneuvers to table, bed or toilet, negotiates 3% grade: maneuvers on rugs and over door sills independently FIM - Locomotion: Ambulation Locomotion: Ambulation Assistive Devices: Orthosis;Other (comment);Walker - Rolling (R AFO, R hand orthosis) Ambulation/Gait Assistance: 4: Min assist Locomotion: Ambulation: 1: Travels less than 50 ft with minimal assistance (Pt.>75%)  Comprehension Comprehension Mode: Auditory Comprehension: 6-Follows complex conversation/direction: With extra time/assistive device  Expression Expression Mode: Verbal Expression: 6-Expresses complex ideas: With extra time/assistive device  Social Interaction Social Interaction: 5-Interacts appropriately 90% of the time - Needs monitoring or encouragement for participation or interaction.  Problem Solving Problem Solving: 5-Solves basic 90% of the time/requires cueing < 10% of the time  Memory Memory: 6-More than reasonable amt of time  Medical Problem List and Plan:  1. DVT Prophylaxis/Anticoagulation: Pharmaceutical: Lovenox  2. Pain Management: prn tylenol.  3. Bipolar disorder/Mood: Continue tegretol, ambien, as well as Ambien for chronic insomnia. Team to provide ego support. LSCW to follow for evaluation and support.  4. Neuropsych: This patient is capable of making decisions on his own behalf.  5. HTN: continued poor control. increase prinivil to 40, reduce metoprolol and cont imdur. Increased HCTZ to 25mg  titrate meds, BPs fluctuating, pt stressed about daughter, also add amlodipine 6. COPD: will add albuterol MDI prn SOB.  7. Hyperglycemia:  check Hgb A1C--check CBG , am value low today, rec hs snack.  8. Dyslipidemia: continue lipitor.  9. Drug/Tobacco abuse: education provided today regarding stroke risks, etc. Education  ongoing 10.  RLS trial requip, no help last noc, cont current dose for now , may need to increase 11.  Lung nodule left base, repeat CT chest as outpt,f/u with PCP on this LOS (Days) 13 A FACE TO FACE EVALUATION WAS PERFORMED  KIRSTEINS,ANDREW E 10/11/2013 7:01 AM

## 2013-10-11 NOTE — Progress Notes (Signed)
Social Work Patient ID: David Hall, male   DOB: Dec 20, 1955, 58 y.o.   MRN: [id number] Pt wants to go home today due to impending weather coming.  Dan-PA aware and awaiting MD response. Will make sure DME here, daughter attended therapies with pt today.

## 2013-10-13 DIAGNOSIS — I10 Essential (primary) hypertension: Secondary | ICD-10-CM | POA: Diagnosis not present

## 2013-10-13 DIAGNOSIS — J449 Chronic obstructive pulmonary disease, unspecified: Secondary | ICD-10-CM | POA: Diagnosis not present

## 2013-10-13 DIAGNOSIS — I69922 Dysarthria following unspecified cerebrovascular disease: Secondary | ICD-10-CM | POA: Diagnosis not present

## 2013-10-13 DIAGNOSIS — E785 Hyperlipidemia, unspecified: Secondary | ICD-10-CM | POA: Diagnosis not present

## 2013-10-13 DIAGNOSIS — G2581 Restless legs syndrome: Secondary | ICD-10-CM | POA: Diagnosis not present

## 2013-10-13 DIAGNOSIS — I69959 Hemiplegia and hemiparesis following unspecified cerebrovascular disease affecting unspecified side: Secondary | ICD-10-CM | POA: Diagnosis not present

## 2013-10-13 DIAGNOSIS — K219 Gastro-esophageal reflux disease without esophagitis: Secondary | ICD-10-CM | POA: Diagnosis not present

## 2013-10-13 DIAGNOSIS — F319 Bipolar disorder, unspecified: Secondary | ICD-10-CM | POA: Diagnosis not present

## 2013-10-13 DIAGNOSIS — Z9181 History of falling: Secondary | ICD-10-CM | POA: Diagnosis not present

## 2013-10-15 DIAGNOSIS — I10 Essential (primary) hypertension: Secondary | ICD-10-CM | POA: Diagnosis not present

## 2013-10-15 DIAGNOSIS — I69922 Dysarthria following unspecified cerebrovascular disease: Secondary | ICD-10-CM | POA: Diagnosis not present

## 2013-10-15 DIAGNOSIS — J449 Chronic obstructive pulmonary disease, unspecified: Secondary | ICD-10-CM | POA: Diagnosis not present

## 2013-10-15 DIAGNOSIS — I69959 Hemiplegia and hemiparesis following unspecified cerebrovascular disease affecting unspecified side: Secondary | ICD-10-CM | POA: Diagnosis not present

## 2013-10-15 DIAGNOSIS — F319 Bipolar disorder, unspecified: Secondary | ICD-10-CM | POA: Diagnosis not present

## 2013-10-15 DIAGNOSIS — G2581 Restless legs syndrome: Secondary | ICD-10-CM | POA: Diagnosis not present

## 2013-10-16 ENCOUNTER — Telehealth: Payer: Self-pay

## 2013-10-16 DIAGNOSIS — I69922 Dysarthria following unspecified cerebrovascular disease: Secondary | ICD-10-CM | POA: Diagnosis not present

## 2013-10-16 DIAGNOSIS — J449 Chronic obstructive pulmonary disease, unspecified: Secondary | ICD-10-CM | POA: Diagnosis not present

## 2013-10-16 DIAGNOSIS — F319 Bipolar disorder, unspecified: Secondary | ICD-10-CM | POA: Diagnosis not present

## 2013-10-16 DIAGNOSIS — I10 Essential (primary) hypertension: Secondary | ICD-10-CM | POA: Diagnosis not present

## 2013-10-16 DIAGNOSIS — G2581 Restless legs syndrome: Secondary | ICD-10-CM | POA: Diagnosis not present

## 2013-10-16 DIAGNOSIS — I69959 Hemiplegia and hemiparesis following unspecified cerebrovascular disease affecting unspecified side: Secondary | ICD-10-CM | POA: Diagnosis not present

## 2013-10-16 NOTE — Telephone Encounter (Signed)
FYI:  Patient fell out of bed Sunday.  His shoulder and knee are sore and bruised but ok otherwise.  He was educated on bed transfer.  Ultimate goal is out patient therapy.

## 2013-10-16 NOTE — Telephone Encounter (Signed)
Noted  

## 2013-10-17 ENCOUNTER — Telehealth: Payer: Self-pay

## 2013-10-17 DIAGNOSIS — F319 Bipolar disorder, unspecified: Secondary | ICD-10-CM | POA: Diagnosis not present

## 2013-10-17 DIAGNOSIS — I69922 Dysarthria following unspecified cerebrovascular disease: Secondary | ICD-10-CM | POA: Diagnosis not present

## 2013-10-17 DIAGNOSIS — I10 Essential (primary) hypertension: Secondary | ICD-10-CM | POA: Diagnosis not present

## 2013-10-17 DIAGNOSIS — I69959 Hemiplegia and hemiparesis following unspecified cerebrovascular disease affecting unspecified side: Secondary | ICD-10-CM | POA: Diagnosis not present

## 2013-10-17 DIAGNOSIS — J449 Chronic obstructive pulmonary disease, unspecified: Secondary | ICD-10-CM | POA: Diagnosis not present

## 2013-10-17 DIAGNOSIS — G2581 Restless legs syndrome: Secondary | ICD-10-CM | POA: Diagnosis not present

## 2013-10-17 NOTE — Telephone Encounter (Signed)
Judeen Hammans an Therapist, sports with [facility] called on behalf of patient.  Patient is complaining of increased pain and the tramadol is not helping.  Judeen Hammans did try calling patients PCP but she is [place] of the [place].  Angelica Chessman to have patient do heat and ice.  Any other recommendations?

## 2013-10-17 NOTE — Telephone Encounter (Signed)
David Hall with [facility] called requesting a bedside commode.  Patient daughter also called regarding patients pain.  Spoke with patients daughter and he says his pain in on the side he has fallen, but all over mainly.  Patient daughter says they have been giving him 1000mg  with the tramadol.  Patient says it is not helping, he is staying up all night because of the pain.  Please advise.

## 2013-10-17 NOTE — Telephone Encounter (Signed)
Would use tylenol 650mg  QID along with tramadol ?where is pain

## 2013-10-17 NOTE — Telephone Encounter (Signed)
Looking at my note he has pain diagnosis. He had a CVA.  If he had a new fall needs to go to ED. Has hx of substance abuse, would not use anything stronger.  May get commode

## 2013-10-18 ENCOUNTER — Telehealth: Payer: Self-pay

## 2013-10-18 ENCOUNTER — Other Ambulatory Visit: Payer: Self-pay | Admitting: *Deleted

## 2013-10-18 DIAGNOSIS — I10 Essential (primary) hypertension: Secondary | ICD-10-CM | POA: Diagnosis not present

## 2013-10-18 DIAGNOSIS — R0902 Hypoxemia: Secondary | ICD-10-CM | POA: Diagnosis not present

## 2013-10-18 DIAGNOSIS — J449 Chronic obstructive pulmonary disease, unspecified: Secondary | ICD-10-CM | POA: Diagnosis not present

## 2013-10-18 DIAGNOSIS — G2581 Restless legs syndrome: Secondary | ICD-10-CM | POA: Diagnosis not present

## 2013-10-18 DIAGNOSIS — I69959 Hemiplegia and hemiparesis following unspecified cerebrovascular disease affecting unspecified side: Secondary | ICD-10-CM | POA: Diagnosis not present

## 2013-10-18 DIAGNOSIS — F319 Bipolar disorder, unspecified: Secondary | ICD-10-CM | POA: Diagnosis not present

## 2013-10-18 DIAGNOSIS — I69922 Dysarthria following unspecified cerebrovascular disease: Secondary | ICD-10-CM | POA: Diagnosis not present

## 2013-10-18 NOTE — Telephone Encounter (Signed)
I called and spoke with [name] about Mr Splawn and gave her Dr Letta Pate instructions that he needs to be seen at the ED or at least Urgent Care to follow up on his BP and they can assess his injuries from fall.  I also instructed her to increase his amlodipine from 5 to 10 mg daily.  She says the PCP is out of the country right now and will take him as instructed. I also gave instructions about the parameters.

## 2013-10-18 NOTE — Telephone Encounter (Signed)
Also increase amlodipine to 10mg  qam

## 2013-10-18 NOTE — Telephone Encounter (Signed)
See previous message where I talked with daughter [name].

## 2013-10-18 NOTE — Telephone Encounter (Signed)
This is not normal. He needs PCP followup If systolic greater than 403 should go to ED or urgent care Diastolic greater than 474 should go to the ED or urgent care

## 2013-10-18 NOTE — Telephone Encounter (Signed)
That is okay I also want patient to go to ER or at least urgent care

## 2013-10-18 NOTE — Telephone Encounter (Signed)
Robin with [facility] called regarding patient blood pressure 210/120.  Per patient this is normal.  Shirlean Mylar would like to know what perimeters his blood pressure should be at and any directions.  Please advise.

## 2013-10-18 NOTE — Telephone Encounter (Signed)
Sherry @ [facility] called to inform you that patient's BP was 210/100 this morning. Patient also fell out out bed at 4 am trying to cut his lamp on. He has a lower back abrasion that is 3 cm x 1cm per Judeen Hammans. Patient refuses to go to the ER. Judeen Hammans is requesting a Education officer, museum and a Engineer, manufacturing 3 x a week for the patient. Is this okay?

## 2013-10-19 ENCOUNTER — Telehealth: Payer: Self-pay

## 2013-10-19 DIAGNOSIS — I69922 Dysarthria following unspecified cerebrovascular disease: Secondary | ICD-10-CM | POA: Diagnosis not present

## 2013-10-19 DIAGNOSIS — J449 Chronic obstructive pulmonary disease, unspecified: Secondary | ICD-10-CM | POA: Diagnosis not present

## 2013-10-19 DIAGNOSIS — F319 Bipolar disorder, unspecified: Secondary | ICD-10-CM | POA: Diagnosis not present

## 2013-10-19 DIAGNOSIS — I6350 Cerebral infarction due to unspecified occlusion or stenosis of unspecified cerebral artery: Secondary | ICD-10-CM

## 2013-10-19 DIAGNOSIS — I69959 Hemiplegia and hemiparesis following unspecified cerebrovascular disease affecting unspecified side: Secondary | ICD-10-CM | POA: Diagnosis not present

## 2013-10-19 DIAGNOSIS — I10 Essential (primary) hypertension: Secondary | ICD-10-CM | POA: Diagnosis not present

## 2013-10-19 DIAGNOSIS — G2581 Restless legs syndrome: Secondary | ICD-10-CM | POA: Diagnosis not present

## 2013-10-19 NOTE — Telephone Encounter (Signed)
Ok for bedside commode

## 2013-10-19 NOTE — Telephone Encounter (Signed)
Miranda @ [facility] is requesting a bedside commode for the patient. Is this okay?

## 2013-10-22 NOTE — Telephone Encounter (Signed)
Order for bedside commode placed.

## 2013-10-23 DIAGNOSIS — J984 Other disorders of lung: Secondary | ICD-10-CM | POA: Diagnosis not present

## 2013-10-23 DIAGNOSIS — R059 Cough, unspecified: Secondary | ICD-10-CM | POA: Diagnosis not present

## 2013-10-23 DIAGNOSIS — R911 Solitary pulmonary nodule: Secondary | ICD-10-CM | POA: Diagnosis not present

## 2013-10-23 DIAGNOSIS — R05 Cough: Secondary | ICD-10-CM | POA: Diagnosis not present

## 2013-10-24 DIAGNOSIS — I69959 Hemiplegia and hemiparesis following unspecified cerebrovascular disease affecting unspecified side: Secondary | ICD-10-CM | POA: Diagnosis not present

## 2013-10-24 DIAGNOSIS — I69922 Dysarthria following unspecified cerebrovascular disease: Secondary | ICD-10-CM | POA: Diagnosis not present

## 2013-10-24 DIAGNOSIS — F319 Bipolar disorder, unspecified: Secondary | ICD-10-CM | POA: Diagnosis not present

## 2013-10-24 DIAGNOSIS — G2581 Restless legs syndrome: Secondary | ICD-10-CM | POA: Diagnosis not present

## 2013-10-24 DIAGNOSIS — I10 Essential (primary) hypertension: Secondary | ICD-10-CM | POA: Diagnosis not present

## 2013-10-24 DIAGNOSIS — J449 Chronic obstructive pulmonary disease, unspecified: Secondary | ICD-10-CM | POA: Diagnosis not present

## 2013-10-25 DIAGNOSIS — I69922 Dysarthria following unspecified cerebrovascular disease: Secondary | ICD-10-CM | POA: Diagnosis not present

## 2013-10-25 DIAGNOSIS — I10 Essential (primary) hypertension: Secondary | ICD-10-CM | POA: Diagnosis not present

## 2013-10-25 DIAGNOSIS — I69959 Hemiplegia and hemiparesis following unspecified cerebrovascular disease affecting unspecified side: Secondary | ICD-10-CM | POA: Diagnosis not present

## 2013-10-25 DIAGNOSIS — J449 Chronic obstructive pulmonary disease, unspecified: Secondary | ICD-10-CM | POA: Diagnosis not present

## 2013-10-25 DIAGNOSIS — G2581 Restless legs syndrome: Secondary | ICD-10-CM | POA: Diagnosis not present

## 2013-10-25 DIAGNOSIS — F319 Bipolar disorder, unspecified: Secondary | ICD-10-CM | POA: Diagnosis not present

## 2013-10-26 DIAGNOSIS — E785 Hyperlipidemia, unspecified: Secondary | ICD-10-CM | POA: Diagnosis not present

## 2013-10-26 DIAGNOSIS — Z8673 Personal history of transient ischemic attack (TIA), and cerebral infarction without residual deficits: Secondary | ICD-10-CM | POA: Diagnosis not present

## 2013-10-26 DIAGNOSIS — F319 Bipolar disorder, unspecified: Secondary | ICD-10-CM | POA: Diagnosis not present

## 2013-10-26 DIAGNOSIS — I10 Essential (primary) hypertension: Secondary | ICD-10-CM | POA: Diagnosis not present

## 2013-10-26 DIAGNOSIS — G2581 Restless legs syndrome: Secondary | ICD-10-CM | POA: Diagnosis not present

## 2013-10-26 DIAGNOSIS — R7309 Other abnormal glucose: Secondary | ICD-10-CM | POA: Diagnosis not present

## 2013-10-26 DIAGNOSIS — I69959 Hemiplegia and hemiparesis following unspecified cerebrovascular disease affecting unspecified side: Secondary | ICD-10-CM

## 2013-10-26 DIAGNOSIS — J449 Chronic obstructive pulmonary disease, unspecified: Secondary | ICD-10-CM | POA: Diagnosis not present

## 2013-10-26 DIAGNOSIS — I69922 Dysarthria following unspecified cerebrovascular disease: Secondary | ICD-10-CM | POA: Diagnosis not present

## 2013-10-29 DIAGNOSIS — J449 Chronic obstructive pulmonary disease, unspecified: Secondary | ICD-10-CM | POA: Diagnosis not present

## 2013-10-29 DIAGNOSIS — I10 Essential (primary) hypertension: Secondary | ICD-10-CM | POA: Diagnosis not present

## 2013-10-29 DIAGNOSIS — I69959 Hemiplegia and hemiparesis following unspecified cerebrovascular disease affecting unspecified side: Secondary | ICD-10-CM | POA: Diagnosis not present

## 2013-10-29 DIAGNOSIS — I69922 Dysarthria following unspecified cerebrovascular disease: Secondary | ICD-10-CM | POA: Diagnosis not present

## 2013-10-29 DIAGNOSIS — G2581 Restless legs syndrome: Secondary | ICD-10-CM | POA: Diagnosis not present

## 2013-10-29 DIAGNOSIS — F319 Bipolar disorder, unspecified: Secondary | ICD-10-CM | POA: Diagnosis not present

## 2013-10-30 DIAGNOSIS — F319 Bipolar disorder, unspecified: Secondary | ICD-10-CM | POA: Diagnosis not present

## 2013-10-30 DIAGNOSIS — J449 Chronic obstructive pulmonary disease, unspecified: Secondary | ICD-10-CM | POA: Diagnosis not present

## 2013-10-30 DIAGNOSIS — I69959 Hemiplegia and hemiparesis following unspecified cerebrovascular disease affecting unspecified side: Secondary | ICD-10-CM | POA: Diagnosis not present

## 2013-10-30 DIAGNOSIS — I69922 Dysarthria following unspecified cerebrovascular disease: Secondary | ICD-10-CM | POA: Diagnosis not present

## 2013-10-30 DIAGNOSIS — I10 Essential (primary) hypertension: Secondary | ICD-10-CM | POA: Diagnosis not present

## 2013-10-30 DIAGNOSIS — G2581 Restless legs syndrome: Secondary | ICD-10-CM | POA: Diagnosis not present

## 2013-11-01 DIAGNOSIS — F319 Bipolar disorder, unspecified: Secondary | ICD-10-CM | POA: Diagnosis not present

## 2013-11-01 DIAGNOSIS — I69922 Dysarthria following unspecified cerebrovascular disease: Secondary | ICD-10-CM | POA: Diagnosis not present

## 2013-11-01 DIAGNOSIS — I10 Essential (primary) hypertension: Secondary | ICD-10-CM | POA: Diagnosis not present

## 2013-11-01 DIAGNOSIS — J449 Chronic obstructive pulmonary disease, unspecified: Secondary | ICD-10-CM | POA: Diagnosis not present

## 2013-11-01 DIAGNOSIS — G2581 Restless legs syndrome: Secondary | ICD-10-CM | POA: Diagnosis not present

## 2013-11-01 DIAGNOSIS — I69959 Hemiplegia and hemiparesis following unspecified cerebrovascular disease affecting unspecified side: Secondary | ICD-10-CM | POA: Diagnosis not present

## 2013-11-02 DIAGNOSIS — I69922 Dysarthria following unspecified cerebrovascular disease: Secondary | ICD-10-CM | POA: Diagnosis not present

## 2013-11-02 DIAGNOSIS — I10 Essential (primary) hypertension: Secondary | ICD-10-CM | POA: Diagnosis not present

## 2013-11-02 DIAGNOSIS — I69959 Hemiplegia and hemiparesis following unspecified cerebrovascular disease affecting unspecified side: Secondary | ICD-10-CM | POA: Diagnosis not present

## 2013-11-02 DIAGNOSIS — J449 Chronic obstructive pulmonary disease, unspecified: Secondary | ICD-10-CM | POA: Diagnosis not present

## 2013-11-02 DIAGNOSIS — G2581 Restless legs syndrome: Secondary | ICD-10-CM | POA: Diagnosis not present

## 2013-11-02 DIAGNOSIS — F319 Bipolar disorder, unspecified: Secondary | ICD-10-CM | POA: Diagnosis not present

## 2013-11-03 ENCOUNTER — Emergency Department: Payer: Self-pay | Admitting: Emergency Medicine

## 2013-11-03 DIAGNOSIS — S298XXA Other specified injuries of thorax, initial encounter: Secondary | ICD-10-CM | POA: Diagnosis not present

## 2013-11-03 DIAGNOSIS — M25539 Pain in unspecified wrist: Secondary | ICD-10-CM | POA: Diagnosis not present

## 2013-11-03 DIAGNOSIS — S20219A Contusion of unspecified front wall of thorax, initial encounter: Secondary | ICD-10-CM | POA: Diagnosis not present

## 2013-11-03 DIAGNOSIS — R079 Chest pain, unspecified: Secondary | ICD-10-CM | POA: Diagnosis not present

## 2013-11-03 DIAGNOSIS — M25559 Pain in unspecified hip: Secondary | ICD-10-CM | POA: Diagnosis not present

## 2013-11-03 DIAGNOSIS — Z7982 Long term (current) use of aspirin: Secondary | ICD-10-CM | POA: Diagnosis not present

## 2013-11-03 DIAGNOSIS — S79919A Unspecified injury of unspecified hip, initial encounter: Secondary | ICD-10-CM | POA: Diagnosis not present

## 2013-11-03 DIAGNOSIS — T07XXXA Unspecified multiple injuries, initial encounter: Secondary | ICD-10-CM | POA: Diagnosis not present

## 2013-11-03 DIAGNOSIS — I10 Essential (primary) hypertension: Secondary | ICD-10-CM | POA: Diagnosis not present

## 2013-11-03 DIAGNOSIS — I69998 Other sequelae following unspecified cerebrovascular disease: Secondary | ICD-10-CM | POA: Diagnosis not present

## 2013-11-03 DIAGNOSIS — R52 Pain, unspecified: Secondary | ICD-10-CM | POA: Diagnosis not present

## 2013-11-03 DIAGNOSIS — Z79899 Other long term (current) drug therapy: Secondary | ICD-10-CM | POA: Diagnosis not present

## 2013-11-03 DIAGNOSIS — S7000XA Contusion of unspecified hip, initial encounter: Secondary | ICD-10-CM | POA: Diagnosis not present

## 2013-11-03 DIAGNOSIS — S59909A Unspecified injury of unspecified elbow, initial encounter: Secondary | ICD-10-CM | POA: Diagnosis not present

## 2013-11-03 DIAGNOSIS — S59919A Unspecified injury of unspecified forearm, initial encounter: Secondary | ICD-10-CM | POA: Diagnosis not present

## 2013-11-03 DIAGNOSIS — E119 Type 2 diabetes mellitus without complications: Secondary | ICD-10-CM | POA: Diagnosis not present

## 2013-11-03 DIAGNOSIS — S60219A Contusion of unspecified wrist, initial encounter: Secondary | ICD-10-CM | POA: Diagnosis not present

## 2013-11-05 DIAGNOSIS — J449 Chronic obstructive pulmonary disease, unspecified: Secondary | ICD-10-CM | POA: Diagnosis not present

## 2013-11-05 DIAGNOSIS — I10 Essential (primary) hypertension: Secondary | ICD-10-CM | POA: Diagnosis not present

## 2013-11-05 DIAGNOSIS — G2581 Restless legs syndrome: Secondary | ICD-10-CM | POA: Diagnosis not present

## 2013-11-05 DIAGNOSIS — F319 Bipolar disorder, unspecified: Secondary | ICD-10-CM | POA: Diagnosis not present

## 2013-11-05 DIAGNOSIS — I69922 Dysarthria following unspecified cerebrovascular disease: Secondary | ICD-10-CM | POA: Diagnosis not present

## 2013-11-05 DIAGNOSIS — I69959 Hemiplegia and hemiparesis following unspecified cerebrovascular disease affecting unspecified side: Secondary | ICD-10-CM | POA: Diagnosis not present

## 2013-11-07 DIAGNOSIS — I69959 Hemiplegia and hemiparesis following unspecified cerebrovascular disease affecting unspecified side: Secondary | ICD-10-CM | POA: Diagnosis not present

## 2013-11-07 DIAGNOSIS — I69922 Dysarthria following unspecified cerebrovascular disease: Secondary | ICD-10-CM | POA: Diagnosis not present

## 2013-11-07 DIAGNOSIS — I10 Essential (primary) hypertension: Secondary | ICD-10-CM | POA: Diagnosis not present

## 2013-11-07 DIAGNOSIS — F319 Bipolar disorder, unspecified: Secondary | ICD-10-CM | POA: Diagnosis not present

## 2013-11-07 DIAGNOSIS — G2581 Restless legs syndrome: Secondary | ICD-10-CM | POA: Diagnosis not present

## 2013-11-07 DIAGNOSIS — J449 Chronic obstructive pulmonary disease, unspecified: Secondary | ICD-10-CM | POA: Diagnosis not present

## 2013-11-09 DIAGNOSIS — I10 Essential (primary) hypertension: Secondary | ICD-10-CM | POA: Diagnosis not present

## 2013-11-09 DIAGNOSIS — I69959 Hemiplegia and hemiparesis following unspecified cerebrovascular disease affecting unspecified side: Secondary | ICD-10-CM | POA: Diagnosis not present

## 2013-11-09 DIAGNOSIS — F319 Bipolar disorder, unspecified: Secondary | ICD-10-CM | POA: Diagnosis not present

## 2013-11-09 DIAGNOSIS — I69922 Dysarthria following unspecified cerebrovascular disease: Secondary | ICD-10-CM | POA: Diagnosis not present

## 2013-11-09 DIAGNOSIS — J449 Chronic obstructive pulmonary disease, unspecified: Secondary | ICD-10-CM | POA: Diagnosis not present

## 2013-11-09 DIAGNOSIS — G2581 Restless legs syndrome: Secondary | ICD-10-CM | POA: Diagnosis not present

## 2013-11-11 DIAGNOSIS — Z79899 Other long term (current) drug therapy: Secondary | ICD-10-CM | POA: Diagnosis not present

## 2013-11-11 DIAGNOSIS — G894 Chronic pain syndrome: Secondary | ICD-10-CM | POA: Diagnosis not present

## 2013-11-11 DIAGNOSIS — I10 Essential (primary) hypertension: Secondary | ICD-10-CM | POA: Diagnosis not present

## 2013-11-11 DIAGNOSIS — G8190 Hemiplegia, unspecified affecting unspecified side: Secondary | ICD-10-CM | POA: Diagnosis not present

## 2013-11-11 DIAGNOSIS — M79609 Pain in unspecified limb: Secondary | ICD-10-CM | POA: Diagnosis not present

## 2013-11-11 DIAGNOSIS — F209 Schizophrenia, unspecified: Secondary | ICD-10-CM | POA: Diagnosis not present

## 2013-11-11 DIAGNOSIS — E785 Hyperlipidemia, unspecified: Secondary | ICD-10-CM | POA: Diagnosis not present

## 2013-11-11 DIAGNOSIS — Z86711 Personal history of pulmonary embolism: Secondary | ICD-10-CM | POA: Diagnosis not present

## 2013-11-11 DIAGNOSIS — T1490XA Injury, unspecified, initial encounter: Secondary | ICD-10-CM | POA: Diagnosis not present

## 2013-11-11 DIAGNOSIS — F319 Bipolar disorder, unspecified: Secondary | ICD-10-CM | POA: Diagnosis not present

## 2013-11-11 DIAGNOSIS — I69959 Hemiplegia and hemiparesis following unspecified cerebrovascular disease affecting unspecified side: Secondary | ICD-10-CM | POA: Diagnosis not present

## 2013-11-12 ENCOUNTER — Telehealth: Payer: Self-pay

## 2013-11-12 DIAGNOSIS — I69922 Dysarthria following unspecified cerebrovascular disease: Secondary | ICD-10-CM | POA: Diagnosis not present

## 2013-11-12 DIAGNOSIS — J449 Chronic obstructive pulmonary disease, unspecified: Secondary | ICD-10-CM | POA: Diagnosis not present

## 2013-11-12 DIAGNOSIS — I10 Essential (primary) hypertension: Secondary | ICD-10-CM | POA: Diagnosis not present

## 2013-11-12 DIAGNOSIS — I69959 Hemiplegia and hemiparesis following unspecified cerebrovascular disease affecting unspecified side: Secondary | ICD-10-CM | POA: Diagnosis not present

## 2013-11-12 DIAGNOSIS — G2581 Restless legs syndrome: Secondary | ICD-10-CM | POA: Diagnosis not present

## 2013-11-12 DIAGNOSIS — F319 Bipolar disorder, unspecified: Secondary | ICD-10-CM | POA: Diagnosis not present

## 2013-11-12 NOTE — Telephone Encounter (Signed)
Alice a Education officer, museum with [facility] called requesting orders to see patient again to help him with his daughter.  Verbal given to see patient but patient would need to follow up for any other orders.

## 2013-11-13 ENCOUNTER — Inpatient Hospital Stay: Payer: Medicare Other | Admitting: Physical Medicine & Rehabilitation

## 2013-11-13 ENCOUNTER — Encounter: Payer: Medicare Other | Attending: Physical Medicine & Rehabilitation

## 2013-11-13 DIAGNOSIS — R05 Cough: Secondary | ICD-10-CM | POA: Diagnosis not present

## 2013-11-13 DIAGNOSIS — R0602 Shortness of breath: Secondary | ICD-10-CM | POA: Diagnosis not present

## 2013-11-13 DIAGNOSIS — R918 Other nonspecific abnormal finding of lung field: Secondary | ICD-10-CM | POA: Diagnosis not present

## 2013-11-13 DIAGNOSIS — R059 Cough, unspecified: Secondary | ICD-10-CM | POA: Diagnosis not present

## 2013-11-13 DIAGNOSIS — J449 Chronic obstructive pulmonary disease, unspecified: Secondary | ICD-10-CM | POA: Diagnosis not present

## 2013-11-13 DIAGNOSIS — Z79899 Other long term (current) drug therapy: Secondary | ICD-10-CM | POA: Diagnosis not present

## 2013-11-13 DIAGNOSIS — R911 Solitary pulmonary nodule: Secondary | ICD-10-CM | POA: Diagnosis not present

## 2013-11-13 DIAGNOSIS — C3490 Malignant neoplasm of unspecified part of unspecified bronchus or lung: Secondary | ICD-10-CM | POA: Diagnosis not present

## 2013-11-14 DIAGNOSIS — I69922 Dysarthria following unspecified cerebrovascular disease: Secondary | ICD-10-CM | POA: Diagnosis not present

## 2013-11-14 DIAGNOSIS — G2581 Restless legs syndrome: Secondary | ICD-10-CM | POA: Diagnosis not present

## 2013-11-14 DIAGNOSIS — F323 Major depressive disorder, single episode, severe with psychotic features: Secondary | ICD-10-CM | POA: Diagnosis not present

## 2013-11-14 DIAGNOSIS — F411 Generalized anxiety disorder: Secondary | ICD-10-CM | POA: Diagnosis not present

## 2013-11-14 DIAGNOSIS — J449 Chronic obstructive pulmonary disease, unspecified: Secondary | ICD-10-CM | POA: Diagnosis not present

## 2013-11-14 DIAGNOSIS — I69959 Hemiplegia and hemiparesis following unspecified cerebrovascular disease affecting unspecified side: Secondary | ICD-10-CM | POA: Diagnosis not present

## 2013-11-14 DIAGNOSIS — F319 Bipolar disorder, unspecified: Secondary | ICD-10-CM | POA: Diagnosis not present

## 2013-11-14 DIAGNOSIS — I10 Essential (primary) hypertension: Secondary | ICD-10-CM | POA: Diagnosis not present

## 2013-11-15 DIAGNOSIS — F319 Bipolar disorder, unspecified: Secondary | ICD-10-CM | POA: Diagnosis not present

## 2013-11-15 DIAGNOSIS — I69959 Hemiplegia and hemiparesis following unspecified cerebrovascular disease affecting unspecified side: Secondary | ICD-10-CM | POA: Diagnosis not present

## 2013-11-15 DIAGNOSIS — I69922 Dysarthria following unspecified cerebrovascular disease: Secondary | ICD-10-CM | POA: Diagnosis not present

## 2013-11-15 DIAGNOSIS — J449 Chronic obstructive pulmonary disease, unspecified: Secondary | ICD-10-CM | POA: Diagnosis not present

## 2013-11-15 DIAGNOSIS — I10 Essential (primary) hypertension: Secondary | ICD-10-CM | POA: Diagnosis not present

## 2013-11-15 DIAGNOSIS — G2581 Restless legs syndrome: Secondary | ICD-10-CM | POA: Diagnosis not present

## 2013-11-19 ENCOUNTER — Inpatient Hospital Stay: Payer: Self-pay | Admitting: Student

## 2013-11-19 DIAGNOSIS — Z87891 Personal history of nicotine dependence: Secondary | ICD-10-CM | POA: Diagnosis not present

## 2013-11-19 DIAGNOSIS — R52 Pain, unspecified: Secondary | ICD-10-CM | POA: Diagnosis not present

## 2013-11-19 DIAGNOSIS — A419 Sepsis, unspecified organism: Secondary | ICD-10-CM | POA: Diagnosis not present

## 2013-11-19 DIAGNOSIS — I69922 Dysarthria following unspecified cerebrovascular disease: Secondary | ICD-10-CM | POA: Diagnosis not present

## 2013-11-19 DIAGNOSIS — I69959 Hemiplegia and hemiparesis following unspecified cerebrovascular disease affecting unspecified side: Secondary | ICD-10-CM | POA: Diagnosis not present

## 2013-11-19 DIAGNOSIS — J4489 Other specified chronic obstructive pulmonary disease: Secondary | ICD-10-CM | POA: Diagnosis present

## 2013-11-19 DIAGNOSIS — I69998 Other sequelae following unspecified cerebrovascular disease: Secondary | ICD-10-CM | POA: Diagnosis not present

## 2013-11-19 DIAGNOSIS — R29898 Other symptoms and signs involving the musculoskeletal system: Secondary | ICD-10-CM | POA: Diagnosis present

## 2013-11-19 DIAGNOSIS — I69920 Aphasia following unspecified cerebrovascular disease: Secondary | ICD-10-CM | POA: Diagnosis not present

## 2013-11-19 DIAGNOSIS — F411 Generalized anxiety disorder: Secondary | ICD-10-CM | POA: Diagnosis present

## 2013-11-19 DIAGNOSIS — G2581 Restless legs syndrome: Secondary | ICD-10-CM | POA: Diagnosis not present

## 2013-11-19 DIAGNOSIS — J690 Pneumonitis due to inhalation of food and vomit: Secondary | ICD-10-CM | POA: Diagnosis present

## 2013-11-19 DIAGNOSIS — K219 Gastro-esophageal reflux disease without esophagitis: Secondary | ICD-10-CM | POA: Diagnosis present

## 2013-11-19 DIAGNOSIS — Z7982 Long term (current) use of aspirin: Secondary | ICD-10-CM | POA: Diagnosis not present

## 2013-11-19 DIAGNOSIS — E785 Hyperlipidemia, unspecified: Secondary | ICD-10-CM | POA: Diagnosis present

## 2013-11-19 DIAGNOSIS — J449 Chronic obstructive pulmonary disease, unspecified: Secondary | ICD-10-CM | POA: Diagnosis not present

## 2013-11-19 DIAGNOSIS — J189 Pneumonia, unspecified organism: Secondary | ICD-10-CM | POA: Diagnosis not present

## 2013-11-19 DIAGNOSIS — S0990XA Unspecified injury of head, initial encounter: Secondary | ICD-10-CM | POA: Diagnosis not present

## 2013-11-19 DIAGNOSIS — F3130 Bipolar disorder, current episode depressed, mild or moderate severity, unspecified: Secondary | ICD-10-CM | POA: Diagnosis present

## 2013-11-19 DIAGNOSIS — I10 Essential (primary) hypertension: Secondary | ICD-10-CM | POA: Diagnosis not present

## 2013-11-19 DIAGNOSIS — J984 Other disorders of lung: Secondary | ICD-10-CM | POA: Diagnosis present

## 2013-11-19 DIAGNOSIS — E876 Hypokalemia: Secondary | ICD-10-CM | POA: Diagnosis not present

## 2013-11-19 DIAGNOSIS — N179 Acute kidney failure, unspecified: Secondary | ICD-10-CM | POA: Diagnosis not present

## 2013-11-19 DIAGNOSIS — F319 Bipolar disorder, unspecified: Secondary | ICD-10-CM | POA: Diagnosis not present

## 2013-11-19 DIAGNOSIS — R5381 Other malaise: Secondary | ICD-10-CM | POA: Diagnosis not present

## 2013-11-19 LAB — COMPREHENSIVE METABOLIC PANEL
Albumin: 3.1 g/dL — ABNORMAL LOW (ref 3.4–5.0)
Alkaline Phosphatase: 102 U/L
Anion Gap: 6 — ABNORMAL LOW (ref 7–16)
BUN: 14 mg/dL (ref 7–18)
Bilirubin,Total: 0.4 mg/dL (ref 0.2–1.0)
Calcium, Total: 8.4 mg/dL — ABNORMAL LOW (ref 8.5–10.1)
Chloride: 99 mmol/L (ref 98–107)
Co2: 31 mmol/L (ref 21–32)
Creatinine: 1.12 mg/dL (ref 0.60–1.30)
EGFR (African American): >60
EGFR (Non-African Amer.): >60
Glucose: 87 mg/dL (ref 65–99)
Osmolality: 272 (ref 275–301)
Potassium: 3.2 mmol/L — ABNORMAL LOW (ref 3.5–5.1)
SGOT(AST): 17 U/L (ref 15–37)
SGPT (ALT): 17 U/L (ref 12–78)
Sodium: 136 mmol/L (ref 136–145)
Total Protein: 6.8 g/dL (ref 6.4–8.2)

## 2013-11-19 LAB — CBC
HCT: 35.9 % — ABNORMAL LOW (ref 40.0–52.0)
HGB: 11.8 g/dL — ABNORMAL LOW (ref 13.0–18.0)
MCH: 29.3 pg (ref 26.0–34.0)
MCHC: 32.9 g/dL (ref 32.0–36.0)
MCV: 89 fL (ref 80–100)
Platelet: 250 10*3/uL (ref 150–440)
RBC: 4.03 10*6/uL — ABNORMAL LOW (ref 4.40–5.90)
RDW: 14 % (ref 11.5–14.5)
WBC: 20 10*3/uL — ABNORMAL HIGH (ref 3.8–10.6)

## 2013-11-19 LAB — URINALYSIS, COMPLETE
Bacteria: NONE SEEN
Bilirubin,UR: NEGATIVE
Blood: NEGATIVE
Glucose,UR: NEGATIVE mg/dL (ref 0–75)
Ketone: NEGATIVE
Leukocyte Esterase: NEGATIVE
Nitrite: NEGATIVE
Ph: 7 (ref 4.5–8.0)
Protein: NEGATIVE
RBC,UR: 1 /HPF (ref 0–5)
Specific Gravity: 1.006 (ref 1.003–1.030)
Squamous Epithelial: <1
WBC UR: 1 /HPF (ref 0–5)

## 2013-11-19 LAB — TROPONIN I: Troponin-I: <0.02 ng/mL

## 2013-11-20 LAB — BASIC METABOLIC PANEL
Anion Gap: 6 — ABNORMAL LOW (ref 7–16)
BUN: 16 mg/dL (ref 7–18)
Calcium, Total: 8.4 mg/dL — ABNORMAL LOW (ref 8.5–10.1)
Chloride: 105 mmol/L (ref 98–107)
Co2: 28 mmol/L (ref 21–32)
Creatinine: 1.35 mg/dL — ABNORMAL HIGH (ref 0.60–1.30)
EGFR (African American): >60
EGFR (Non-African Amer.): 58 — ABNORMAL LOW
Glucose: 99 mg/dL (ref 65–99)
Osmolality: 279 (ref 275–301)
Potassium: 3.1 mmol/L — ABNORMAL LOW (ref 3.5–5.1)
Sodium: 139 mmol/L (ref 136–145)

## 2013-11-20 LAB — CBC WITH DIFFERENTIAL/PLATELET
Basophil #: 0 10*3/uL (ref 0.0–0.1)
Basophil %: 0.4 %
Eosinophil #: 0.1 10*3/uL (ref 0.0–0.7)
Eosinophil %: 1.2 %
HCT: 34.7 % — ABNORMAL LOW (ref 40.0–52.0)
HGB: 11.4 g/dL — ABNORMAL LOW (ref 13.0–18.0)
Lymphocyte #: 0.7 10*3/uL — ABNORMAL LOW (ref 1.0–3.6)
Lymphocyte %: 5.7 %
MCH: 29 pg (ref 26.0–34.0)
MCHC: 32.7 g/dL (ref 32.0–36.0)
MCV: 89 fL (ref 80–100)
Monocyte #: 1.3 x10 3/mm — ABNORMAL HIGH (ref 0.2–1.0)
Monocyte %: 10.5 %
Neutrophil #: 10 10*3/uL — ABNORMAL HIGH (ref 1.4–6.5)
Neutrophil %: 82.2 %
Platelet: 191 10*3/uL (ref 150–440)
RBC: 3.92 10*6/uL — ABNORMAL LOW (ref 4.40–5.90)
RDW: 14 % (ref 11.5–14.5)
WBC: 12.2 10*3/uL — ABNORMAL HIGH (ref 3.8–10.6)

## 2013-11-21 LAB — BASIC METABOLIC PANEL
Anion Gap: 4 — ABNORMAL LOW (ref 7–16)
BUN: 20 mg/dL — ABNORMAL HIGH (ref 7–18)
Calcium, Total: 8.9 mg/dL (ref 8.5–10.1)
Chloride: 105 mmol/L (ref 98–107)
Co2: 29 mmol/L (ref 21–32)
Creatinine: 1.51 mg/dL — ABNORMAL HIGH (ref 0.60–1.30)
EGFR (African American): 59 — ABNORMAL LOW
EGFR (Non-African Amer.): 51 — ABNORMAL LOW
Glucose: 139 mg/dL — ABNORMAL HIGH (ref 65–99)
Osmolality: 281 (ref 275–301)
Potassium: 3.7 mmol/L (ref 3.5–5.1)
Sodium: 138 mmol/L (ref 136–145)

## 2013-11-21 LAB — CBC WITH DIFFERENTIAL/PLATELET
Basophil #: 0 10*3/uL (ref 0.0–0.1)
Basophil %: 0.2 %
Eosinophil #: 0 10*3/uL (ref 0.0–0.7)
Eosinophil %: 0 %
HCT: 33.5 % — ABNORMAL LOW (ref 40.0–52.0)
HGB: 11 g/dL — ABNORMAL LOW (ref 13.0–18.0)
Lymphocyte #: 0.6 10*3/uL — ABNORMAL LOW (ref 1.0–3.6)
Lymphocyte %: 8.1 %
MCH: 29.4 pg (ref 26.0–34.0)
MCHC: 32.9 g/dL (ref 32.0–36.0)
MCV: 89 fL (ref 80–100)
Monocyte #: 0.3 x10 3/mm (ref 0.2–1.0)
Monocyte %: 3.7 %
Neutrophil #: 6.4 10*3/uL (ref 1.4–6.5)
Neutrophil %: 88 %
Platelet: 206 10*3/uL (ref 150–440)
RBC: 3.75 10*6/uL — ABNORMAL LOW (ref 4.40–5.90)
RDW: 14 % (ref 11.5–14.5)
WBC: 7.2 10*3/uL (ref 3.8–10.6)

## 2013-11-22 LAB — BASIC METABOLIC PANEL
Anion Gap: 4 — ABNORMAL LOW (ref 7–16)
BUN: 23 mg/dL — ABNORMAL HIGH (ref 7–18)
Calcium, Total: 8.4 mg/dL — ABNORMAL LOW (ref 8.5–10.1)
Chloride: 106 mmol/L (ref 98–107)
Co2: 28 mmol/L (ref 21–32)
Creatinine: 1.3 mg/dL (ref 0.60–1.30)
EGFR (African American): >60
EGFR (Non-African Amer.): >60
Glucose: 139 mg/dL — ABNORMAL HIGH (ref 65–99)
Osmolality: 282 (ref 275–301)
Potassium: 3.5 mmol/L (ref 3.5–5.1)
Sodium: 138 mmol/L (ref 136–145)

## 2013-11-23 DIAGNOSIS — G2581 Restless legs syndrome: Secondary | ICD-10-CM | POA: Diagnosis not present

## 2013-11-23 DIAGNOSIS — F319 Bipolar disorder, unspecified: Secondary | ICD-10-CM | POA: Diagnosis not present

## 2013-11-23 DIAGNOSIS — J449 Chronic obstructive pulmonary disease, unspecified: Secondary | ICD-10-CM | POA: Diagnosis not present

## 2013-11-23 DIAGNOSIS — I69959 Hemiplegia and hemiparesis following unspecified cerebrovascular disease affecting unspecified side: Secondary | ICD-10-CM | POA: Diagnosis not present

## 2013-11-23 DIAGNOSIS — I10 Essential (primary) hypertension: Secondary | ICD-10-CM | POA: Diagnosis not present

## 2013-11-23 DIAGNOSIS — I69922 Dysarthria following unspecified cerebrovascular disease: Secondary | ICD-10-CM | POA: Diagnosis not present

## 2013-11-24 DIAGNOSIS — IMO0002 Reserved for concepts with insufficient information to code with codable children: Secondary | ICD-10-CM | POA: Diagnosis not present

## 2013-11-24 DIAGNOSIS — E785 Hyperlipidemia, unspecified: Secondary | ICD-10-CM | POA: Diagnosis not present

## 2013-11-24 DIAGNOSIS — Z8673 Personal history of transient ischemic attack (TIA), and cerebral infarction without residual deficits: Secondary | ICD-10-CM | POA: Diagnosis not present

## 2013-11-24 DIAGNOSIS — R0789 Other chest pain: Secondary | ICD-10-CM | POA: Diagnosis not present

## 2013-11-24 DIAGNOSIS — Z86711 Personal history of pulmonary embolism: Secondary | ICD-10-CM | POA: Diagnosis not present

## 2013-11-24 DIAGNOSIS — F17200 Nicotine dependence, unspecified, uncomplicated: Secondary | ICD-10-CM | POA: Diagnosis not present

## 2013-11-24 DIAGNOSIS — I6350 Cerebral infarction due to unspecified occlusion or stenosis of unspecified cerebral artery: Secondary | ICD-10-CM | POA: Diagnosis not present

## 2013-11-24 DIAGNOSIS — J9819 Other pulmonary collapse: Secondary | ICD-10-CM | POA: Diagnosis not present

## 2013-11-24 DIAGNOSIS — R071 Chest pain on breathing: Secondary | ICD-10-CM | POA: Diagnosis not present

## 2013-11-24 DIAGNOSIS — I10 Essential (primary) hypertension: Secondary | ICD-10-CM | POA: Diagnosis not present

## 2013-11-24 DIAGNOSIS — I998 Other disorder of circulatory system: Secondary | ICD-10-CM | POA: Diagnosis not present

## 2013-11-24 DIAGNOSIS — G839 Paralytic syndrome, unspecified: Secondary | ICD-10-CM | POA: Diagnosis not present

## 2013-11-24 DIAGNOSIS — F209 Schizophrenia, unspecified: Secondary | ICD-10-CM | POA: Diagnosis not present

## 2013-11-24 DIAGNOSIS — R079 Chest pain, unspecified: Secondary | ICD-10-CM | POA: Diagnosis not present

## 2013-11-24 DIAGNOSIS — J189 Pneumonia, unspecified organism: Secondary | ICD-10-CM | POA: Diagnosis not present

## 2013-11-24 LAB — CULTURE, BLOOD (SINGLE)

## 2013-11-26 DIAGNOSIS — J449 Chronic obstructive pulmonary disease, unspecified: Secondary | ICD-10-CM | POA: Diagnosis not present

## 2013-11-26 DIAGNOSIS — F319 Bipolar disorder, unspecified: Secondary | ICD-10-CM | POA: Diagnosis not present

## 2013-11-26 DIAGNOSIS — I10 Essential (primary) hypertension: Secondary | ICD-10-CM | POA: Diagnosis not present

## 2013-11-26 DIAGNOSIS — G2581 Restless legs syndrome: Secondary | ICD-10-CM | POA: Diagnosis not present

## 2013-11-26 DIAGNOSIS — I69922 Dysarthria following unspecified cerebrovascular disease: Secondary | ICD-10-CM | POA: Diagnosis not present

## 2013-11-26 DIAGNOSIS — I69959 Hemiplegia and hemiparesis following unspecified cerebrovascular disease affecting unspecified side: Secondary | ICD-10-CM | POA: Diagnosis not present

## 2013-11-27 DIAGNOSIS — I10 Essential (primary) hypertension: Secondary | ICD-10-CM | POA: Diagnosis not present

## 2013-11-27 DIAGNOSIS — G2581 Restless legs syndrome: Secondary | ICD-10-CM | POA: Diagnosis not present

## 2013-11-27 DIAGNOSIS — F319 Bipolar disorder, unspecified: Secondary | ICD-10-CM | POA: Diagnosis not present

## 2013-11-27 DIAGNOSIS — I69922 Dysarthria following unspecified cerebrovascular disease: Secondary | ICD-10-CM | POA: Diagnosis not present

## 2013-11-27 DIAGNOSIS — I69959 Hemiplegia and hemiparesis following unspecified cerebrovascular disease affecting unspecified side: Secondary | ICD-10-CM | POA: Diagnosis not present

## 2013-11-27 DIAGNOSIS — J449 Chronic obstructive pulmonary disease, unspecified: Secondary | ICD-10-CM | POA: Diagnosis not present

## 2013-11-28 DIAGNOSIS — J449 Chronic obstructive pulmonary disease, unspecified: Secondary | ICD-10-CM | POA: Diagnosis not present

## 2013-11-28 DIAGNOSIS — G2581 Restless legs syndrome: Secondary | ICD-10-CM | POA: Diagnosis not present

## 2013-11-28 DIAGNOSIS — I10 Essential (primary) hypertension: Secondary | ICD-10-CM | POA: Diagnosis not present

## 2013-11-28 DIAGNOSIS — F319 Bipolar disorder, unspecified: Secondary | ICD-10-CM | POA: Diagnosis not present

## 2013-11-28 DIAGNOSIS — I69959 Hemiplegia and hemiparesis following unspecified cerebrovascular disease affecting unspecified side: Secondary | ICD-10-CM | POA: Diagnosis not present

## 2013-11-28 DIAGNOSIS — I69922 Dysarthria following unspecified cerebrovascular disease: Secondary | ICD-10-CM | POA: Diagnosis not present

## 2013-11-29 DIAGNOSIS — I69959 Hemiplegia and hemiparesis following unspecified cerebrovascular disease affecting unspecified side: Secondary | ICD-10-CM | POA: Diagnosis not present

## 2013-11-29 DIAGNOSIS — J449 Chronic obstructive pulmonary disease, unspecified: Secondary | ICD-10-CM | POA: Diagnosis not present

## 2013-11-29 DIAGNOSIS — I10 Essential (primary) hypertension: Secondary | ICD-10-CM | POA: Diagnosis not present

## 2013-11-29 DIAGNOSIS — I69922 Dysarthria following unspecified cerebrovascular disease: Secondary | ICD-10-CM | POA: Diagnosis not present

## 2013-11-29 DIAGNOSIS — G2581 Restless legs syndrome: Secondary | ICD-10-CM | POA: Diagnosis not present

## 2013-11-29 DIAGNOSIS — F319 Bipolar disorder, unspecified: Secondary | ICD-10-CM | POA: Diagnosis not present

## 2013-11-30 DIAGNOSIS — I10 Essential (primary) hypertension: Secondary | ICD-10-CM | POA: Diagnosis not present

## 2013-11-30 DIAGNOSIS — F319 Bipolar disorder, unspecified: Secondary | ICD-10-CM | POA: Diagnosis not present

## 2013-11-30 DIAGNOSIS — J449 Chronic obstructive pulmonary disease, unspecified: Secondary | ICD-10-CM | POA: Diagnosis not present

## 2013-11-30 DIAGNOSIS — G2581 Restless legs syndrome: Secondary | ICD-10-CM | POA: Diagnosis not present

## 2013-11-30 DIAGNOSIS — I69959 Hemiplegia and hemiparesis following unspecified cerebrovascular disease affecting unspecified side: Secondary | ICD-10-CM | POA: Diagnosis not present

## 2013-11-30 DIAGNOSIS — I69922 Dysarthria following unspecified cerebrovascular disease: Secondary | ICD-10-CM | POA: Diagnosis not present

## 2013-12-04 DIAGNOSIS — I10 Essential (primary) hypertension: Secondary | ICD-10-CM | POA: Diagnosis not present

## 2013-12-04 DIAGNOSIS — I69922 Dysarthria following unspecified cerebrovascular disease: Secondary | ICD-10-CM | POA: Diagnosis not present

## 2013-12-04 DIAGNOSIS — F319 Bipolar disorder, unspecified: Secondary | ICD-10-CM | POA: Diagnosis not present

## 2013-12-04 DIAGNOSIS — I69959 Hemiplegia and hemiparesis following unspecified cerebrovascular disease affecting unspecified side: Secondary | ICD-10-CM | POA: Diagnosis not present

## 2013-12-04 DIAGNOSIS — J449 Chronic obstructive pulmonary disease, unspecified: Secondary | ICD-10-CM | POA: Diagnosis not present

## 2013-12-04 DIAGNOSIS — G2581 Restless legs syndrome: Secondary | ICD-10-CM | POA: Diagnosis not present

## 2013-12-05 DIAGNOSIS — I69922 Dysarthria following unspecified cerebrovascular disease: Secondary | ICD-10-CM | POA: Diagnosis not present

## 2013-12-05 DIAGNOSIS — J449 Chronic obstructive pulmonary disease, unspecified: Secondary | ICD-10-CM | POA: Diagnosis not present

## 2013-12-05 DIAGNOSIS — G2581 Restless legs syndrome: Secondary | ICD-10-CM | POA: Diagnosis not present

## 2013-12-05 DIAGNOSIS — I10 Essential (primary) hypertension: Secondary | ICD-10-CM | POA: Diagnosis not present

## 2013-12-05 DIAGNOSIS — I69959 Hemiplegia and hemiparesis following unspecified cerebrovascular disease affecting unspecified side: Secondary | ICD-10-CM | POA: Diagnosis not present

## 2013-12-05 DIAGNOSIS — F319 Bipolar disorder, unspecified: Secondary | ICD-10-CM | POA: Diagnosis not present

## 2013-12-06 DIAGNOSIS — F319 Bipolar disorder, unspecified: Secondary | ICD-10-CM | POA: Diagnosis not present

## 2013-12-06 DIAGNOSIS — I69959 Hemiplegia and hemiparesis following unspecified cerebrovascular disease affecting unspecified side: Secondary | ICD-10-CM | POA: Diagnosis not present

## 2013-12-06 DIAGNOSIS — I69922 Dysarthria following unspecified cerebrovascular disease: Secondary | ICD-10-CM | POA: Diagnosis not present

## 2013-12-06 DIAGNOSIS — I10 Essential (primary) hypertension: Secondary | ICD-10-CM | POA: Diagnosis not present

## 2013-12-06 DIAGNOSIS — J449 Chronic obstructive pulmonary disease, unspecified: Secondary | ICD-10-CM | POA: Diagnosis not present

## 2013-12-06 DIAGNOSIS — G2581 Restless legs syndrome: Secondary | ICD-10-CM | POA: Diagnosis not present

## 2013-12-07 DIAGNOSIS — J449 Chronic obstructive pulmonary disease, unspecified: Secondary | ICD-10-CM | POA: Diagnosis not present

## 2013-12-07 DIAGNOSIS — I69959 Hemiplegia and hemiparesis following unspecified cerebrovascular disease affecting unspecified side: Secondary | ICD-10-CM | POA: Diagnosis not present

## 2013-12-07 DIAGNOSIS — F319 Bipolar disorder, unspecified: Secondary | ICD-10-CM | POA: Diagnosis not present

## 2013-12-07 DIAGNOSIS — G2581 Restless legs syndrome: Secondary | ICD-10-CM | POA: Diagnosis not present

## 2013-12-07 DIAGNOSIS — K219 Gastro-esophageal reflux disease without esophagitis: Secondary | ICD-10-CM | POA: Diagnosis not present

## 2013-12-07 DIAGNOSIS — G4734 Idiopathic sleep related nonobstructive alveolar hypoventilation: Secondary | ICD-10-CM | POA: Diagnosis not present

## 2013-12-07 DIAGNOSIS — E785 Hyperlipidemia, unspecified: Secondary | ICD-10-CM | POA: Diagnosis not present

## 2013-12-07 DIAGNOSIS — I69922 Dysarthria following unspecified cerebrovascular disease: Secondary | ICD-10-CM | POA: Diagnosis not present

## 2013-12-07 DIAGNOSIS — I10 Essential (primary) hypertension: Secondary | ICD-10-CM | POA: Diagnosis not present

## 2013-12-08 ENCOUNTER — Emergency Department: Payer: Self-pay | Admitting: Emergency Medicine

## 2013-12-08 DIAGNOSIS — I10 Essential (primary) hypertension: Secondary | ICD-10-CM | POA: Diagnosis not present

## 2013-12-08 DIAGNOSIS — S99919A Unspecified injury of unspecified ankle, initial encounter: Secondary | ICD-10-CM | POA: Diagnosis not present

## 2013-12-08 DIAGNOSIS — S93609A Unspecified sprain of unspecified foot, initial encounter: Secondary | ICD-10-CM | POA: Diagnosis not present

## 2013-12-08 DIAGNOSIS — Z79899 Other long term (current) drug therapy: Secondary | ICD-10-CM | POA: Diagnosis not present

## 2013-12-08 DIAGNOSIS — S8990XA Unspecified injury of unspecified lower leg, initial encounter: Secondary | ICD-10-CM | POA: Diagnosis not present

## 2013-12-08 DIAGNOSIS — M79609 Pain in unspecified limb: Secondary | ICD-10-CM | POA: Diagnosis not present

## 2013-12-08 DIAGNOSIS — F17200 Nicotine dependence, unspecified, uncomplicated: Secondary | ICD-10-CM | POA: Diagnosis not present

## 2013-12-08 DIAGNOSIS — E119 Type 2 diabetes mellitus without complications: Secondary | ICD-10-CM | POA: Diagnosis not present

## 2013-12-10 ENCOUNTER — Encounter: Payer: Self-pay | Admitting: Family Medicine

## 2013-12-10 DIAGNOSIS — M6281 Muscle weakness (generalized): Secondary | ICD-10-CM | POA: Diagnosis not present

## 2013-12-10 DIAGNOSIS — G8190 Hemiplegia, unspecified affecting unspecified side: Secondary | ICD-10-CM | POA: Diagnosis not present

## 2013-12-10 DIAGNOSIS — R262 Difficulty in walking, not elsewhere classified: Secondary | ICD-10-CM | POA: Diagnosis not present

## 2013-12-10 DIAGNOSIS — M25579 Pain in unspecified ankle and joints of unspecified foot: Secondary | ICD-10-CM | POA: Diagnosis not present

## 2013-12-10 DIAGNOSIS — IMO0001 Reserved for inherently not codable concepts without codable children: Secondary | ICD-10-CM | POA: Diagnosis not present

## 2013-12-10 DIAGNOSIS — R279 Unspecified lack of coordination: Secondary | ICD-10-CM | POA: Diagnosis not present

## 2013-12-10 DIAGNOSIS — Z5189 Encounter for other specified aftercare: Secondary | ICD-10-CM | POA: Diagnosis not present

## 2013-12-20 ENCOUNTER — Emergency Department: Payer: Self-pay | Admitting: Emergency Medicine

## 2013-12-20 DIAGNOSIS — R55 Syncope and collapse: Secondary | ICD-10-CM | POA: Diagnosis not present

## 2013-12-20 DIAGNOSIS — R079 Chest pain, unspecified: Secondary | ICD-10-CM | POA: Diagnosis not present

## 2013-12-20 DIAGNOSIS — F319 Bipolar disorder, unspecified: Secondary | ICD-10-CM | POA: Diagnosis not present

## 2013-12-20 DIAGNOSIS — F17200 Nicotine dependence, unspecified, uncomplicated: Secondary | ICD-10-CM | POA: Diagnosis not present

## 2013-12-20 DIAGNOSIS — D72829 Elevated white blood cell count, unspecified: Secondary | ICD-10-CM | POA: Diagnosis not present

## 2013-12-20 DIAGNOSIS — R4182 Altered mental status, unspecified: Secondary | ICD-10-CM | POA: Diagnosis not present

## 2013-12-20 DIAGNOSIS — Z8673 Personal history of transient ischemic attack (TIA), and cerebral infarction without residual deficits: Secondary | ICD-10-CM | POA: Diagnosis not present

## 2013-12-20 DIAGNOSIS — R0602 Shortness of breath: Secondary | ICD-10-CM | POA: Diagnosis not present

## 2013-12-20 DIAGNOSIS — Z79899 Other long term (current) drug therapy: Secondary | ICD-10-CM | POA: Diagnosis not present

## 2013-12-20 DIAGNOSIS — R5381 Other malaise: Secondary | ICD-10-CM | POA: Diagnosis not present

## 2013-12-20 DIAGNOSIS — I10 Essential (primary) hypertension: Secondary | ICD-10-CM | POA: Diagnosis not present

## 2013-12-20 DIAGNOSIS — R5383 Other fatigue: Secondary | ICD-10-CM | POA: Diagnosis not present

## 2013-12-20 LAB — COMPREHENSIVE METABOLIC PANEL
Albumin: 3.2 g/dL — ABNORMAL LOW (ref 3.4–5.0)
Alkaline Phosphatase: 115 U/L
Anion Gap: 5 — ABNORMAL LOW (ref 7–16)
BUN: 15 mg/dL (ref 7–18)
Bilirubin,Total: 0.2 mg/dL (ref 0.2–1.0)
Calcium, Total: 8.2 mg/dL — ABNORMAL LOW (ref 8.5–10.1)
Chloride: 104 mmol/L (ref 98–107)
Co2: 29 mmol/L (ref 21–32)
Creatinine: 1.07 mg/dL (ref 0.60–1.30)
EGFR (African American): >60
EGFR (Non-African Amer.): >60
Glucose: 68 mg/dL (ref 65–99)
Osmolality: 275 (ref 275–301)
Potassium: 3.8 mmol/L (ref 3.5–5.1)
SGOT(AST): 17 U/L (ref 15–37)
SGPT (ALT): 18 U/L (ref 12–78)
Sodium: 138 mmol/L (ref 136–145)
Total Protein: 6.5 g/dL (ref 6.4–8.2)

## 2013-12-20 LAB — DRUG SCREEN, URINE

## 2013-12-20 LAB — CBC
HCT: 37 % — ABNORMAL LOW (ref 40.0–52.0)
HGB: 11.9 g/dL — ABNORMAL LOW (ref 13.0–18.0)
MCH: 28.8 pg (ref 26.0–34.0)
MCHC: 32.2 g/dL (ref 32.0–36.0)
MCV: 89 fL (ref 80–100)
Platelet: 173 10*3/uL (ref 150–440)
RBC: 4.14 10*6/uL — ABNORMAL LOW (ref 4.40–5.90)
RDW: 14.3 % (ref 11.5–14.5)
WBC: 14.4 10*3/uL — ABNORMAL HIGH (ref 3.8–10.6)

## 2013-12-20 LAB — URINALYSIS, COMPLETE
Bacteria: NONE SEEN
Bilirubin,UR: NEGATIVE
Blood: NEGATIVE
Glucose,UR: NEGATIVE mg/dL (ref 0–75)
Ketone: NEGATIVE
Leukocyte Esterase: NEGATIVE
Nitrite: NEGATIVE
Ph: 7 (ref 4.5–8.0)
Protein: NEGATIVE
RBC,UR: NONE SEEN /HPF (ref 0–5)
Specific Gravity: 1.014 (ref 1.003–1.030)
Squamous Epithelial: NONE SEEN
WBC UR: <1 /HPF (ref 0–5)

## 2013-12-20 LAB — TROPONIN I: Troponin-I: <0.02 ng/mL

## 2013-12-20 LAB — ETHANOL
Ethanol %: <0.003 % (ref 0.000–0.080)
Ethanol: <3 mg/dL

## 2013-12-21 DIAGNOSIS — I6789 Other cerebrovascular disease: Secondary | ICD-10-CM | POA: Diagnosis not present

## 2013-12-21 DIAGNOSIS — R5381 Other malaise: Secondary | ICD-10-CM | POA: Diagnosis not present

## 2013-12-21 DIAGNOSIS — R5383 Other fatigue: Secondary | ICD-10-CM | POA: Diagnosis not present

## 2014-01-07 ENCOUNTER — Encounter: Payer: Self-pay | Admitting: Family Medicine

## 2014-01-07 DIAGNOSIS — M6281 Muscle weakness (generalized): Secondary | ICD-10-CM | POA: Diagnosis not present

## 2014-01-07 DIAGNOSIS — M25579 Pain in unspecified ankle and joints of unspecified foot: Secondary | ICD-10-CM | POA: Diagnosis not present

## 2014-01-07 DIAGNOSIS — R262 Difficulty in walking, not elsewhere classified: Secondary | ICD-10-CM | POA: Diagnosis not present

## 2014-01-07 DIAGNOSIS — R279 Unspecified lack of coordination: Secondary | ICD-10-CM | POA: Diagnosis not present

## 2014-01-07 DIAGNOSIS — IMO0001 Reserved for inherently not codable concepts without codable children: Secondary | ICD-10-CM | POA: Diagnosis not present

## 2014-01-07 DIAGNOSIS — G8190 Hemiplegia, unspecified affecting unspecified side: Secondary | ICD-10-CM | POA: Diagnosis not present

## 2014-01-07 DIAGNOSIS — Z5189 Encounter for other specified aftercare: Secondary | ICD-10-CM | POA: Diagnosis not present

## 2014-01-11 DIAGNOSIS — F319 Bipolar disorder, unspecified: Secondary | ICD-10-CM | POA: Diagnosis not present

## 2014-01-11 DIAGNOSIS — K219 Gastro-esophageal reflux disease without esophagitis: Secondary | ICD-10-CM | POA: Diagnosis not present

## 2014-01-11 DIAGNOSIS — I10 Essential (primary) hypertension: Secondary | ICD-10-CM | POA: Diagnosis not present

## 2014-01-11 DIAGNOSIS — F17200 Nicotine dependence, unspecified, uncomplicated: Secondary | ICD-10-CM | POA: Diagnosis not present

## 2014-01-15 DIAGNOSIS — F323 Major depressive disorder, single episode, severe with psychotic features: Secondary | ICD-10-CM | POA: Diagnosis not present

## 2014-02-05 DIAGNOSIS — M75100 Unspecified rotator cuff tear or rupture of unspecified shoulder, not specified as traumatic: Secondary | ICD-10-CM | POA: Diagnosis not present

## 2014-02-05 DIAGNOSIS — IMO0002 Reserved for concepts with insufficient information to code with codable children: Secondary | ICD-10-CM | POA: Diagnosis not present

## 2014-02-05 DIAGNOSIS — I6789 Other cerebrovascular disease: Secondary | ICD-10-CM | POA: Diagnosis not present

## 2014-02-06 ENCOUNTER — Encounter: Payer: Self-pay | Admitting: Family Medicine

## 2014-02-06 DIAGNOSIS — R279 Unspecified lack of coordination: Secondary | ICD-10-CM | POA: Diagnosis not present

## 2014-02-06 DIAGNOSIS — M6281 Muscle weakness (generalized): Secondary | ICD-10-CM | POA: Diagnosis not present

## 2014-02-06 DIAGNOSIS — R262 Difficulty in walking, not elsewhere classified: Secondary | ICD-10-CM | POA: Diagnosis not present

## 2014-02-06 DIAGNOSIS — IMO0001 Reserved for inherently not codable concepts without codable children: Secondary | ICD-10-CM | POA: Diagnosis not present

## 2014-02-06 DIAGNOSIS — Z5189 Encounter for other specified aftercare: Secondary | ICD-10-CM | POA: Diagnosis not present

## 2014-02-06 DIAGNOSIS — G8190 Hemiplegia, unspecified affecting unspecified side: Secondary | ICD-10-CM | POA: Diagnosis not present

## 2014-02-06 DIAGNOSIS — M25579 Pain in unspecified ankle and joints of unspecified foot: Secondary | ICD-10-CM | POA: Diagnosis not present

## 2014-02-12 DIAGNOSIS — Z79899 Other long term (current) drug therapy: Secondary | ICD-10-CM | POA: Diagnosis not present

## 2014-02-12 DIAGNOSIS — M719 Bursopathy, unspecified: Secondary | ICD-10-CM | POA: Diagnosis not present

## 2014-02-12 DIAGNOSIS — Z5181 Encounter for therapeutic drug level monitoring: Secondary | ICD-10-CM | POA: Diagnosis not present

## 2014-02-12 DIAGNOSIS — M67919 Unspecified disorder of synovium and tendon, unspecified shoulder: Secondary | ICD-10-CM | POA: Diagnosis not present

## 2014-02-12 DIAGNOSIS — M25519 Pain in unspecified shoulder: Secondary | ICD-10-CM | POA: Diagnosis not present

## 2014-02-12 DIAGNOSIS — M19019 Primary osteoarthritis, unspecified shoulder: Secondary | ICD-10-CM | POA: Diagnosis not present

## 2014-02-12 DIAGNOSIS — F17200 Nicotine dependence, unspecified, uncomplicated: Secondary | ICD-10-CM | POA: Diagnosis not present

## 2014-03-09 ENCOUNTER — Encounter: Payer: Self-pay | Admitting: Family Medicine

## 2014-04-16 ENCOUNTER — Emergency Department: Payer: Self-pay | Admitting: Emergency Medicine

## 2014-04-16 DIAGNOSIS — F17200 Nicotine dependence, unspecified, uncomplicated: Secondary | ICD-10-CM | POA: Diagnosis not present

## 2014-04-16 DIAGNOSIS — S43026A Posterior dislocation of unspecified humerus, initial encounter: Secondary | ICD-10-CM | POA: Diagnosis not present

## 2014-04-16 DIAGNOSIS — Z79899 Other long term (current) drug therapy: Secondary | ICD-10-CM | POA: Diagnosis not present

## 2014-04-16 DIAGNOSIS — Z7982 Long term (current) use of aspirin: Secondary | ICD-10-CM | POA: Diagnosis not present

## 2014-04-16 DIAGNOSIS — I10 Essential (primary) hypertension: Secondary | ICD-10-CM | POA: Diagnosis not present

## 2014-04-16 DIAGNOSIS — S43006A Unspecified dislocation of unspecified shoulder joint, initial encounter: Secondary | ICD-10-CM | POA: Diagnosis not present

## 2014-04-16 DIAGNOSIS — Z791 Long term (current) use of non-steroidal anti-inflammatories (NSAID): Secondary | ICD-10-CM | POA: Diagnosis not present

## 2014-04-16 DIAGNOSIS — M25519 Pain in unspecified shoulder: Secondary | ICD-10-CM | POA: Diagnosis not present

## 2014-04-16 DIAGNOSIS — Z9889 Other specified postprocedural states: Secondary | ICD-10-CM | POA: Diagnosis not present

## 2014-04-17 DIAGNOSIS — F323 Major depressive disorder, single episode, severe with psychotic features: Secondary | ICD-10-CM | POA: Diagnosis not present

## 2014-04-21 ENCOUNTER — Emergency Department: Payer: Self-pay | Admitting: Emergency Medicine

## 2014-04-21 DIAGNOSIS — R52 Pain, unspecified: Secondary | ICD-10-CM | POA: Diagnosis not present

## 2014-04-21 DIAGNOSIS — I6350 Cerebral infarction due to unspecified occlusion or stenosis of unspecified cerebral artery: Secondary | ICD-10-CM | POA: Diagnosis not present

## 2014-04-21 DIAGNOSIS — G8929 Other chronic pain: Secondary | ICD-10-CM | POA: Diagnosis not present

## 2014-04-21 DIAGNOSIS — M79609 Pain in unspecified limb: Secondary | ICD-10-CM | POA: Diagnosis not present

## 2014-04-21 DIAGNOSIS — I69939 Monoplegia of upper limb following unspecified cerebrovascular disease affecting unspecified side: Secondary | ICD-10-CM | POA: Diagnosis not present

## 2014-04-21 DIAGNOSIS — I10 Essential (primary) hypertension: Secondary | ICD-10-CM | POA: Diagnosis not present

## 2014-04-21 DIAGNOSIS — G8190 Hemiplegia, unspecified affecting unspecified side: Secondary | ICD-10-CM | POA: Diagnosis not present

## 2014-04-23 DIAGNOSIS — E785 Hyperlipidemia, unspecified: Secondary | ICD-10-CM | POA: Diagnosis not present

## 2014-04-23 DIAGNOSIS — Z9181 History of falling: Secondary | ICD-10-CM | POA: Diagnosis not present

## 2014-04-23 DIAGNOSIS — M79609 Pain in unspecified limb: Secondary | ICD-10-CM | POA: Diagnosis not present

## 2014-04-23 DIAGNOSIS — Z23 Encounter for immunization: Secondary | ICD-10-CM | POA: Diagnosis not present

## 2014-04-23 DIAGNOSIS — I10 Essential (primary) hypertension: Secondary | ICD-10-CM | POA: Diagnosis not present

## 2014-05-06 DIAGNOSIS — M67919 Unspecified disorder of synovium and tendon, unspecified shoulder: Secondary | ICD-10-CM | POA: Diagnosis not present

## 2014-05-06 DIAGNOSIS — M719 Bursopathy, unspecified: Secondary | ICD-10-CM | POA: Diagnosis not present

## 2014-05-14 DIAGNOSIS — M25421 Effusion, right elbow: Secondary | ICD-10-CM | POA: Diagnosis not present

## 2014-05-14 DIAGNOSIS — F319 Bipolar disorder, unspecified: Secondary | ICD-10-CM | POA: Diagnosis not present

## 2014-05-14 DIAGNOSIS — Z79899 Other long term (current) drug therapy: Secondary | ICD-10-CM | POA: Diagnosis not present

## 2014-05-14 DIAGNOSIS — F209 Schizophrenia, unspecified: Secondary | ICD-10-CM | POA: Diagnosis not present

## 2014-05-14 DIAGNOSIS — Z8673 Personal history of transient ischemic attack (TIA), and cerebral infarction without residual deficits: Secondary | ICD-10-CM | POA: Diagnosis not present

## 2014-05-14 DIAGNOSIS — E785 Hyperlipidemia, unspecified: Secondary | ICD-10-CM | POA: Diagnosis not present

## 2014-05-14 DIAGNOSIS — F17210 Nicotine dependence, cigarettes, uncomplicated: Secondary | ICD-10-CM | POA: Diagnosis not present

## 2014-05-14 DIAGNOSIS — R911 Solitary pulmonary nodule: Secondary | ICD-10-CM | POA: Diagnosis not present

## 2014-05-14 DIAGNOSIS — R7309 Other abnormal glucose: Secondary | ICD-10-CM | POA: Diagnosis not present

## 2014-05-14 DIAGNOSIS — J449 Chronic obstructive pulmonary disease, unspecified: Secondary | ICD-10-CM | POA: Diagnosis not present

## 2014-05-14 DIAGNOSIS — M25521 Pain in right elbow: Secondary | ICD-10-CM | POA: Diagnosis not present

## 2014-05-14 DIAGNOSIS — I10 Essential (primary) hypertension: Secondary | ICD-10-CM | POA: Diagnosis not present

## 2014-05-21 DIAGNOSIS — I10 Essential (primary) hypertension: Secondary | ICD-10-CM | POA: Diagnosis not present

## 2014-05-21 DIAGNOSIS — I639 Cerebral infarction, unspecified: Secondary | ICD-10-CM | POA: Diagnosis not present

## 2014-05-21 DIAGNOSIS — F17210 Nicotine dependence, cigarettes, uncomplicated: Secondary | ICD-10-CM | POA: Diagnosis not present

## 2014-05-21 DIAGNOSIS — E119 Type 2 diabetes mellitus without complications: Secondary | ICD-10-CM | POA: Diagnosis not present

## 2014-05-21 DIAGNOSIS — Z9119 Patient's noncompliance with other medical treatment and regimen: Secondary | ICD-10-CM | POA: Diagnosis not present

## 2014-05-21 DIAGNOSIS — R918 Other nonspecific abnormal finding of lung field: Secondary | ICD-10-CM | POA: Insufficient documentation

## 2014-05-21 DIAGNOSIS — M25521 Pain in right elbow: Secondary | ICD-10-CM | POA: Diagnosis not present

## 2014-05-21 DIAGNOSIS — S43006A Unspecified dislocation of unspecified shoulder joint, initial encounter: Secondary | ICD-10-CM | POA: Insufficient documentation

## 2014-05-21 DIAGNOSIS — F209 Schizophrenia, unspecified: Secondary | ICD-10-CM | POA: Diagnosis not present

## 2014-05-21 DIAGNOSIS — F419 Anxiety disorder, unspecified: Secondary | ICD-10-CM | POA: Diagnosis not present

## 2014-05-21 DIAGNOSIS — M70821 Other soft tissue disorders related to use, overuse and pressure, right upper arm: Secondary | ICD-10-CM | POA: Diagnosis not present

## 2014-05-21 DIAGNOSIS — E785 Hyperlipidemia, unspecified: Secondary | ICD-10-CM | POA: Diagnosis not present

## 2014-05-21 DIAGNOSIS — Z7982 Long term (current) use of aspirin: Secondary | ICD-10-CM | POA: Diagnosis not present

## 2014-05-21 DIAGNOSIS — F319 Bipolar disorder, unspecified: Secondary | ICD-10-CM | POA: Diagnosis not present

## 2014-05-21 DIAGNOSIS — S53104D Unspecified dislocation of right ulnohumeral joint, subsequent encounter: Secondary | ICD-10-CM | POA: Diagnosis not present

## 2014-05-21 DIAGNOSIS — M79601 Pain in right arm: Secondary | ICD-10-CM | POA: Diagnosis not present

## 2014-05-21 DIAGNOSIS — R2231 Localized swelling, mass and lump, right upper limb: Secondary | ICD-10-CM | POA: Diagnosis not present

## 2014-05-21 DIAGNOSIS — R911 Solitary pulmonary nodule: Secondary | ICD-10-CM | POA: Diagnosis not present

## 2014-05-23 DIAGNOSIS — R918 Other nonspecific abnormal finding of lung field: Secondary | ICD-10-CM | POA: Diagnosis not present

## 2014-05-23 DIAGNOSIS — R911 Solitary pulmonary nodule: Secondary | ICD-10-CM | POA: Diagnosis not present

## 2014-05-23 DIAGNOSIS — J9811 Atelectasis: Secondary | ICD-10-CM | POA: Diagnosis not present

## 2014-05-23 DIAGNOSIS — K769 Liver disease, unspecified: Secondary | ICD-10-CM | POA: Diagnosis not present

## 2014-05-24 DIAGNOSIS — J449 Chronic obstructive pulmonary disease, unspecified: Secondary | ICD-10-CM | POA: Diagnosis not present

## 2014-05-24 DIAGNOSIS — I69351 Hemiplegia and hemiparesis following cerebral infarction affecting right dominant side: Secondary | ICD-10-CM | POA: Diagnosis not present

## 2014-05-24 DIAGNOSIS — M159 Polyosteoarthritis, unspecified: Secondary | ICD-10-CM | POA: Diagnosis not present

## 2014-05-24 DIAGNOSIS — K219 Gastro-esophageal reflux disease without esophagitis: Secondary | ICD-10-CM | POA: Diagnosis not present

## 2014-05-24 DIAGNOSIS — F319 Bipolar disorder, unspecified: Secondary | ICD-10-CM | POA: Diagnosis not present

## 2014-05-24 DIAGNOSIS — I10 Essential (primary) hypertension: Secondary | ICD-10-CM | POA: Diagnosis not present

## 2014-05-28 DIAGNOSIS — M25511 Pain in right shoulder: Secondary | ICD-10-CM | POA: Diagnosis not present

## 2014-05-29 DIAGNOSIS — R52 Pain, unspecified: Secondary | ICD-10-CM | POA: Insufficient documentation

## 2014-05-29 DIAGNOSIS — M25519 Pain in unspecified shoulder: Secondary | ICD-10-CM | POA: Insufficient documentation

## 2014-05-29 DIAGNOSIS — M542 Cervicalgia: Secondary | ICD-10-CM | POA: Insufficient documentation

## 2014-05-29 DIAGNOSIS — R296 Repeated falls: Secondary | ICD-10-CM | POA: Insufficient documentation

## 2014-05-29 DIAGNOSIS — M7030 Other bursitis of elbow, unspecified elbow: Secondary | ICD-10-CM | POA: Insufficient documentation

## 2014-05-29 DIAGNOSIS — Z8673 Personal history of transient ischemic attack (TIA), and cerebral infarction without residual deficits: Secondary | ICD-10-CM | POA: Insufficient documentation

## 2014-05-29 DIAGNOSIS — I639 Cerebral infarction, unspecified: Secondary | ICD-10-CM | POA: Insufficient documentation

## 2014-05-30 DIAGNOSIS — F319 Bipolar disorder, unspecified: Secondary | ICD-10-CM | POA: Diagnosis not present

## 2014-05-30 DIAGNOSIS — J449 Chronic obstructive pulmonary disease, unspecified: Secondary | ICD-10-CM | POA: Diagnosis not present

## 2014-05-30 DIAGNOSIS — I69351 Hemiplegia and hemiparesis following cerebral infarction affecting right dominant side: Secondary | ICD-10-CM | POA: Diagnosis not present

## 2014-05-30 DIAGNOSIS — K219 Gastro-esophageal reflux disease without esophagitis: Secondary | ICD-10-CM | POA: Diagnosis not present

## 2014-05-30 DIAGNOSIS — M159 Polyosteoarthritis, unspecified: Secondary | ICD-10-CM | POA: Diagnosis not present

## 2014-05-30 DIAGNOSIS — I10 Essential (primary) hypertension: Secondary | ICD-10-CM | POA: Diagnosis not present

## 2014-06-05 DIAGNOSIS — I10 Essential (primary) hypertension: Secondary | ICD-10-CM | POA: Diagnosis not present

## 2014-06-05 DIAGNOSIS — K219 Gastro-esophageal reflux disease without esophagitis: Secondary | ICD-10-CM | POA: Diagnosis not present

## 2014-06-05 DIAGNOSIS — M159 Polyosteoarthritis, unspecified: Secondary | ICD-10-CM | POA: Diagnosis not present

## 2014-06-05 DIAGNOSIS — I69351 Hemiplegia and hemiparesis following cerebral infarction affecting right dominant side: Secondary | ICD-10-CM | POA: Diagnosis not present

## 2014-06-05 DIAGNOSIS — J449 Chronic obstructive pulmonary disease, unspecified: Secondary | ICD-10-CM | POA: Diagnosis not present

## 2014-06-05 DIAGNOSIS — F319 Bipolar disorder, unspecified: Secondary | ICD-10-CM | POA: Diagnosis not present

## 2014-06-06 DIAGNOSIS — R911 Solitary pulmonary nodule: Secondary | ICD-10-CM | POA: Diagnosis not present

## 2014-06-07 DIAGNOSIS — M159 Polyosteoarthritis, unspecified: Secondary | ICD-10-CM | POA: Diagnosis not present

## 2014-06-07 DIAGNOSIS — K219 Gastro-esophageal reflux disease without esophagitis: Secondary | ICD-10-CM | POA: Diagnosis not present

## 2014-06-07 DIAGNOSIS — I69351 Hemiplegia and hemiparesis following cerebral infarction affecting right dominant side: Secondary | ICD-10-CM | POA: Diagnosis not present

## 2014-06-07 DIAGNOSIS — J449 Chronic obstructive pulmonary disease, unspecified: Secondary | ICD-10-CM | POA: Diagnosis not present

## 2014-06-07 DIAGNOSIS — I10 Essential (primary) hypertension: Secondary | ICD-10-CM | POA: Diagnosis not present

## 2014-06-07 DIAGNOSIS — F319 Bipolar disorder, unspecified: Secondary | ICD-10-CM | POA: Diagnosis not present

## 2014-06-12 DIAGNOSIS — M542 Cervicalgia: Secondary | ICD-10-CM | POA: Diagnosis not present

## 2014-06-12 DIAGNOSIS — I639 Cerebral infarction, unspecified: Secondary | ICD-10-CM | POA: Diagnosis not present

## 2014-06-12 DIAGNOSIS — R296 Repeated falls: Secondary | ICD-10-CM | POA: Diagnosis not present

## 2014-06-12 DIAGNOSIS — Z8673 Personal history of transient ischemic attack (TIA), and cerebral infarction without residual deficits: Secondary | ICD-10-CM | POA: Diagnosis not present

## 2014-06-12 DIAGNOSIS — R52 Pain, unspecified: Secondary | ICD-10-CM | POA: Diagnosis not present

## 2014-06-12 DIAGNOSIS — R911 Solitary pulmonary nodule: Secondary | ICD-10-CM | POA: Diagnosis not present

## 2014-06-12 DIAGNOSIS — I69351 Hemiplegia and hemiparesis following cerebral infarction affecting right dominant side: Secondary | ICD-10-CM | POA: Diagnosis not present

## 2014-06-12 DIAGNOSIS — M7021 Olecranon bursitis, right elbow: Secondary | ICD-10-CM | POA: Diagnosis not present

## 2014-06-12 DIAGNOSIS — M25511 Pain in right shoulder: Secondary | ICD-10-CM | POA: Diagnosis not present

## 2014-06-12 DIAGNOSIS — J449 Chronic obstructive pulmonary disease, unspecified: Secondary | ICD-10-CM | POA: Diagnosis not present

## 2014-06-12 DIAGNOSIS — M25421 Effusion, right elbow: Secondary | ICD-10-CM | POA: Diagnosis not present

## 2014-06-12 DIAGNOSIS — M25411 Effusion, right shoulder: Secondary | ICD-10-CM | POA: Diagnosis not present

## 2014-06-12 DIAGNOSIS — M159 Polyosteoarthritis, unspecified: Secondary | ICD-10-CM | POA: Diagnosis not present

## 2014-06-12 DIAGNOSIS — I10 Essential (primary) hypertension: Secondary | ICD-10-CM | POA: Diagnosis not present

## 2014-06-12 DIAGNOSIS — F319 Bipolar disorder, unspecified: Secondary | ICD-10-CM | POA: Diagnosis not present

## 2014-06-12 DIAGNOSIS — K219 Gastro-esophageal reflux disease without esophagitis: Secondary | ICD-10-CM | POA: Diagnosis not present

## 2014-06-12 DIAGNOSIS — M25521 Pain in right elbow: Secondary | ICD-10-CM | POA: Diagnosis not present

## 2014-06-12 DIAGNOSIS — S43004D Unspecified dislocation of right shoulder joint, subsequent encounter: Secondary | ICD-10-CM | POA: Diagnosis not present

## 2014-06-18 DIAGNOSIS — F209 Schizophrenia, unspecified: Secondary | ICD-10-CM | POA: Diagnosis not present

## 2014-06-18 DIAGNOSIS — I10 Essential (primary) hypertension: Secondary | ICD-10-CM | POA: Diagnosis not present

## 2014-06-18 DIAGNOSIS — M25511 Pain in right shoulder: Secondary | ICD-10-CM | POA: Diagnosis not present

## 2014-06-18 DIAGNOSIS — M7021 Olecranon bursitis, right elbow: Secondary | ICD-10-CM | POA: Diagnosis not present

## 2014-06-18 DIAGNOSIS — M75101 Unspecified rotator cuff tear or rupture of right shoulder, not specified as traumatic: Secondary | ICD-10-CM | POA: Diagnosis not present

## 2014-06-18 DIAGNOSIS — E785 Hyperlipidemia, unspecified: Secondary | ICD-10-CM | POA: Diagnosis not present

## 2014-06-18 DIAGNOSIS — I69398 Other sequelae of cerebral infarction: Secondary | ICD-10-CM | POA: Diagnosis not present

## 2014-06-18 DIAGNOSIS — J449 Chronic obstructive pulmonary disease, unspecified: Secondary | ICD-10-CM | POA: Diagnosis not present

## 2014-06-18 DIAGNOSIS — F17210 Nicotine dependence, cigarettes, uncomplicated: Secondary | ICD-10-CM | POA: Diagnosis not present

## 2014-06-18 DIAGNOSIS — M159 Polyosteoarthritis, unspecified: Secondary | ICD-10-CM | POA: Diagnosis not present

## 2014-06-18 DIAGNOSIS — K219 Gastro-esophageal reflux disease without esophagitis: Secondary | ICD-10-CM | POA: Diagnosis not present

## 2014-06-18 DIAGNOSIS — F319 Bipolar disorder, unspecified: Secondary | ICD-10-CM | POA: Diagnosis not present

## 2014-06-18 DIAGNOSIS — I69351 Hemiplegia and hemiparesis following cerebral infarction affecting right dominant side: Secondary | ICD-10-CM | POA: Diagnosis not present

## 2014-06-18 DIAGNOSIS — R531 Weakness: Secondary | ICD-10-CM | POA: Diagnosis not present

## 2014-06-19 DIAGNOSIS — I69351 Hemiplegia and hemiparesis following cerebral infarction affecting right dominant side: Secondary | ICD-10-CM | POA: Diagnosis not present

## 2014-06-19 DIAGNOSIS — F319 Bipolar disorder, unspecified: Secondary | ICD-10-CM | POA: Diagnosis not present

## 2014-06-19 DIAGNOSIS — I10 Essential (primary) hypertension: Secondary | ICD-10-CM | POA: Diagnosis not present

## 2014-06-19 DIAGNOSIS — J449 Chronic obstructive pulmonary disease, unspecified: Secondary | ICD-10-CM | POA: Diagnosis not present

## 2014-06-19 DIAGNOSIS — M159 Polyosteoarthritis, unspecified: Secondary | ICD-10-CM | POA: Diagnosis not present

## 2014-06-19 DIAGNOSIS — K219 Gastro-esophageal reflux disease without esophagitis: Secondary | ICD-10-CM | POA: Diagnosis not present

## 2014-06-26 ENCOUNTER — Ambulatory Visit: Payer: Self-pay | Admitting: Family Medicine

## 2014-06-26 DIAGNOSIS — R042 Hemoptysis: Secondary | ICD-10-CM | POA: Diagnosis not present

## 2014-06-26 DIAGNOSIS — J45901 Unspecified asthma with (acute) exacerbation: Secondary | ICD-10-CM | POA: Diagnosis not present

## 2014-06-26 DIAGNOSIS — Z993 Dependence on wheelchair: Secondary | ICD-10-CM | POA: Diagnosis not present

## 2014-06-26 DIAGNOSIS — R079 Chest pain, unspecified: Secondary | ICD-10-CM | POA: Diagnosis not present

## 2014-06-26 DIAGNOSIS — Z8673 Personal history of transient ischemic attack (TIA), and cerebral infarction without residual deficits: Secondary | ICD-10-CM | POA: Diagnosis not present

## 2014-06-26 DIAGNOSIS — R911 Solitary pulmonary nodule: Secondary | ICD-10-CM | POA: Diagnosis not present

## 2014-06-26 DIAGNOSIS — I10 Essential (primary) hypertension: Secondary | ICD-10-CM | POA: Diagnosis not present

## 2014-06-26 DIAGNOSIS — R0602 Shortness of breath: Secondary | ICD-10-CM | POA: Diagnosis not present

## 2014-06-26 DIAGNOSIS — R05 Cough: Secondary | ICD-10-CM | POA: Diagnosis not present

## 2014-07-04 ENCOUNTER — Emergency Department: Payer: Self-pay | Admitting: Emergency Medicine

## 2014-07-04 DIAGNOSIS — M25551 Pain in right hip: Secondary | ICD-10-CM | POA: Diagnosis not present

## 2014-07-04 DIAGNOSIS — S8391XA Sprain of unspecified site of right knee, initial encounter: Secondary | ICD-10-CM | POA: Diagnosis not present

## 2014-07-04 DIAGNOSIS — I10 Essential (primary) hypertension: Secondary | ICD-10-CM | POA: Diagnosis not present

## 2014-07-04 DIAGNOSIS — S79911A Unspecified injury of right hip, initial encounter: Secondary | ICD-10-CM | POA: Diagnosis not present

## 2014-07-04 DIAGNOSIS — S7001XA Contusion of right hip, initial encounter: Secondary | ICD-10-CM | POA: Diagnosis not present

## 2014-07-04 DIAGNOSIS — W19XXXA Unspecified fall, initial encounter: Secondary | ICD-10-CM | POA: Diagnosis not present

## 2014-07-04 DIAGNOSIS — S8991XA Unspecified injury of right lower leg, initial encounter: Secondary | ICD-10-CM | POA: Diagnosis not present

## 2014-07-04 DIAGNOSIS — Z720 Tobacco use: Secondary | ICD-10-CM | POA: Diagnosis not present

## 2014-07-04 DIAGNOSIS — E119 Type 2 diabetes mellitus without complications: Secondary | ICD-10-CM | POA: Diagnosis not present

## 2014-07-04 DIAGNOSIS — M25561 Pain in right knee: Secondary | ICD-10-CM | POA: Diagnosis not present

## 2014-07-09 DIAGNOSIS — R911 Solitary pulmonary nodule: Secondary | ICD-10-CM | POA: Diagnosis not present

## 2014-07-09 DIAGNOSIS — F319 Bipolar disorder, unspecified: Secondary | ICD-10-CM | POA: Diagnosis not present

## 2014-07-09 DIAGNOSIS — R7309 Other abnormal glucose: Secondary | ICD-10-CM | POA: Diagnosis not present

## 2014-07-09 DIAGNOSIS — E785 Hyperlipidemia, unspecified: Secondary | ICD-10-CM | POA: Diagnosis not present

## 2014-07-09 DIAGNOSIS — J449 Chronic obstructive pulmonary disease, unspecified: Secondary | ICD-10-CM | POA: Diagnosis not present

## 2014-07-09 DIAGNOSIS — I10 Essential (primary) hypertension: Secondary | ICD-10-CM | POA: Diagnosis not present

## 2014-07-09 DIAGNOSIS — G4734 Idiopathic sleep related nonobstructive alveolar hypoventilation: Secondary | ICD-10-CM | POA: Diagnosis not present

## 2014-07-09 DIAGNOSIS — Z720 Tobacco use: Secondary | ICD-10-CM | POA: Diagnosis not present

## 2014-07-22 DIAGNOSIS — I10 Essential (primary) hypertension: Secondary | ICD-10-CM | POA: Diagnosis not present

## 2014-07-22 DIAGNOSIS — R7309 Other abnormal glucose: Secondary | ICD-10-CM | POA: Diagnosis not present

## 2014-07-22 DIAGNOSIS — K219 Gastro-esophageal reflux disease without esophagitis: Secondary | ICD-10-CM | POA: Diagnosis not present

## 2014-07-22 DIAGNOSIS — R911 Solitary pulmonary nodule: Secondary | ICD-10-CM | POA: Diagnosis not present

## 2014-07-22 DIAGNOSIS — R0989 Other specified symptoms and signs involving the circulatory and respiratory systems: Secondary | ICD-10-CM | POA: Diagnosis not present

## 2014-07-22 DIAGNOSIS — Z720 Tobacco use: Secondary | ICD-10-CM | POA: Diagnosis not present

## 2014-07-22 DIAGNOSIS — E785 Hyperlipidemia, unspecified: Secondary | ICD-10-CM | POA: Diagnosis not present

## 2014-07-22 DIAGNOSIS — G4734 Idiopathic sleep related nonobstructive alveolar hypoventilation: Secondary | ICD-10-CM | POA: Diagnosis not present

## 2014-08-05 ENCOUNTER — Ambulatory Visit: Payer: Self-pay | Admitting: Family Medicine

## 2014-08-05 DIAGNOSIS — W19XXXA Unspecified fall, initial encounter: Secondary | ICD-10-CM | POA: Diagnosis not present

## 2014-08-05 DIAGNOSIS — M19021 Primary osteoarthritis, right elbow: Secondary | ICD-10-CM | POA: Diagnosis not present

## 2014-08-05 DIAGNOSIS — S59901A Unspecified injury of right elbow, initial encounter: Secondary | ICD-10-CM | POA: Diagnosis not present

## 2014-08-05 DIAGNOSIS — I10 Essential (primary) hypertension: Secondary | ICD-10-CM | POA: Diagnosis not present

## 2014-08-05 DIAGNOSIS — S8991XA Unspecified injury of right lower leg, initial encounter: Secondary | ICD-10-CM | POA: Diagnosis not present

## 2014-08-05 DIAGNOSIS — J449 Chronic obstructive pulmonary disease, unspecified: Secondary | ICD-10-CM | POA: Diagnosis not present

## 2014-08-05 DIAGNOSIS — M25521 Pain in right elbow: Secondary | ICD-10-CM | POA: Diagnosis not present

## 2014-08-05 DIAGNOSIS — M25561 Pain in right knee: Secondary | ICD-10-CM | POA: Diagnosis not present

## 2014-08-05 DIAGNOSIS — M25421 Effusion, right elbow: Secondary | ICD-10-CM | POA: Diagnosis not present

## 2014-08-05 DIAGNOSIS — Y92099 Unspecified place in other non-institutional residence as the place of occurrence of the external cause: Secondary | ICD-10-CM | POA: Diagnosis not present

## 2014-08-09 DIAGNOSIS — F1910 Other psychoactive substance abuse, uncomplicated: Secondary | ICD-10-CM

## 2014-08-09 HISTORY — DX: Other psychoactive substance abuse, uncomplicated: F19.10

## 2014-08-21 DIAGNOSIS — I10 Essential (primary) hypertension: Secondary | ICD-10-CM | POA: Diagnosis not present

## 2014-08-27 DIAGNOSIS — F319 Bipolar disorder, unspecified: Secondary | ICD-10-CM | POA: Diagnosis not present

## 2014-08-27 DIAGNOSIS — J449 Chronic obstructive pulmonary disease, unspecified: Secondary | ICD-10-CM | POA: Diagnosis not present

## 2014-08-27 DIAGNOSIS — R7309 Other abnormal glucose: Secondary | ICD-10-CM | POA: Diagnosis not present

## 2014-08-27 DIAGNOSIS — Z720 Tobacco use: Secondary | ICD-10-CM | POA: Diagnosis not present

## 2014-08-27 DIAGNOSIS — K219 Gastro-esophageal reflux disease without esophagitis: Secondary | ICD-10-CM | POA: Diagnosis not present

## 2014-08-27 DIAGNOSIS — E785 Hyperlipidemia, unspecified: Secondary | ICD-10-CM | POA: Diagnosis not present

## 2014-08-27 DIAGNOSIS — I10 Essential (primary) hypertension: Secondary | ICD-10-CM | POA: Diagnosis not present

## 2014-08-27 DIAGNOSIS — M179 Osteoarthritis of knee, unspecified: Secondary | ICD-10-CM | POA: Diagnosis not present

## 2014-08-31 DIAGNOSIS — S299XXA Unspecified injury of thorax, initial encounter: Secondary | ICD-10-CM | POA: Diagnosis not present

## 2014-08-31 DIAGNOSIS — S59901A Unspecified injury of right elbow, initial encounter: Secondary | ICD-10-CM | POA: Diagnosis not present

## 2014-08-31 DIAGNOSIS — R51 Headache: Secondary | ICD-10-CM | POA: Diagnosis not present

## 2014-08-31 DIAGNOSIS — M79601 Pain in right arm: Secondary | ICD-10-CM | POA: Diagnosis not present

## 2014-08-31 DIAGNOSIS — M25561 Pain in right knee: Secondary | ICD-10-CM | POA: Diagnosis not present

## 2014-08-31 DIAGNOSIS — W19XXXA Unspecified fall, initial encounter: Secondary | ICD-10-CM | POA: Diagnosis not present

## 2014-08-31 DIAGNOSIS — R079 Chest pain, unspecified: Secondary | ICD-10-CM | POA: Diagnosis not present

## 2014-08-31 DIAGNOSIS — S5001XA Contusion of right elbow, initial encounter: Secondary | ICD-10-CM | POA: Diagnosis not present

## 2014-08-31 DIAGNOSIS — R45851 Suicidal ideations: Secondary | ICD-10-CM | POA: Diagnosis not present

## 2014-08-31 DIAGNOSIS — S20211A Contusion of right front wall of thorax, initial encounter: Secondary | ICD-10-CM | POA: Diagnosis not present

## 2014-08-31 DIAGNOSIS — M25521 Pain in right elbow: Secondary | ICD-10-CM | POA: Diagnosis not present

## 2014-09-01 DIAGNOSIS — S299XXA Unspecified injury of thorax, initial encounter: Secondary | ICD-10-CM | POA: Diagnosis not present

## 2014-09-01 DIAGNOSIS — M25521 Pain in right elbow: Secondary | ICD-10-CM | POA: Diagnosis not present

## 2014-09-01 DIAGNOSIS — R079 Chest pain, unspecified: Secondary | ICD-10-CM | POA: Diagnosis not present

## 2014-09-01 DIAGNOSIS — S59901A Unspecified injury of right elbow, initial encounter: Secondary | ICD-10-CM | POA: Diagnosis not present

## 2014-09-01 LAB — SALICYLATE LEVEL: Salicylates, Serum: 2.9 mg/dL — ABNORMAL HIGH

## 2014-09-01 LAB — DRUG SCREEN, URINE

## 2014-09-01 LAB — CBC
HCT: 37.6 % — ABNORMAL LOW (ref 40.0–52.0)
HGB: 12.7 g/dL — ABNORMAL LOW (ref 13.0–18.0)
MCH: 28.8 pg (ref 26.0–34.0)
MCHC: 33.9 g/dL (ref 32.0–36.0)
MCV: 85 fL (ref 80–100)
Platelet: 241 10*3/uL (ref 150–440)
RBC: 4.43 10*6/uL (ref 4.40–5.90)
RDW: 14.6 % — ABNORMAL HIGH (ref 11.5–14.5)
WBC: 9.1 10*3/uL (ref 3.8–10.6)

## 2014-09-01 LAB — COMPREHENSIVE METABOLIC PANEL
Albumin: 3.9 g/dL (ref 3.4–5.0)
Alkaline Phosphatase: 133 U/L — ABNORMAL HIGH
Anion Gap: 9 (ref 7–16)
BUN: 8 mg/dL (ref 7–18)
Bilirubin,Total: 0.5 mg/dL (ref 0.2–1.0)
Calcium, Total: 8.6 mg/dL (ref 8.5–10.1)
Chloride: 90 mmol/L — ABNORMAL LOW (ref 98–107)
Co2: 27 mmol/L (ref 21–32)
Creatinine: 0.87 mg/dL (ref 0.60–1.30)
EGFR (African American): >60
EGFR (Non-African Amer.): >60
Glucose: 80 mg/dL (ref 65–99)
Osmolality: 251 (ref 275–301)
Potassium: 3.2 mmol/L — ABNORMAL LOW (ref 3.5–5.1)
SGOT(AST): 27 U/L (ref 15–37)
SGPT (ALT): 23 U/L
Sodium: 126 mmol/L — ABNORMAL LOW (ref 136–145)
Total Protein: 7.5 g/dL (ref 6.4–8.2)

## 2014-09-01 LAB — TSH: Thyroid Stimulating Horm: 0.844 u[IU]/mL

## 2014-09-01 LAB — ETHANOL: Ethanol: <3 mg/dL

## 2014-09-01 LAB — ACETAMINOPHEN LEVEL: Acetaminophen: <2 ug/mL

## 2014-09-02 ENCOUNTER — Inpatient Hospital Stay: Payer: Self-pay | Admitting: Psychiatry

## 2014-09-03 DIAGNOSIS — F17200 Nicotine dependence, unspecified, uncomplicated: Secondary | ICD-10-CM | POA: Diagnosis not present

## 2014-09-03 DIAGNOSIS — F319 Bipolar disorder, unspecified: Secondary | ICD-10-CM | POA: Diagnosis not present

## 2014-09-03 DIAGNOSIS — F0280 Dementia in other diseases classified elsewhere without behavioral disturbance: Secondary | ICD-10-CM | POA: Diagnosis not present

## 2014-09-03 LAB — BASIC METABOLIC PANEL
Anion Gap: 6 — ABNORMAL LOW (ref 7–16)
BUN: 16 mg/dL (ref 7–18)
Calcium, Total: 8.6 mg/dL (ref 8.5–10.1)
Chloride: 102 mmol/L (ref 98–107)
Co2: 26 mmol/L (ref 21–32)
Creatinine: 1.47 mg/dL — ABNORMAL HIGH (ref 0.60–1.30)
EGFR (African American): >60
EGFR (Non-African Amer.): 52 — ABNORMAL LOW
Glucose: 84 mg/dL (ref 65–99)
Osmolality: 269 (ref 275–301)
Potassium: 3.9 mmol/L (ref 3.5–5.1)
Sodium: 134 mmol/L — ABNORMAL LOW (ref 136–145)

## 2014-09-04 DIAGNOSIS — F0280 Dementia in other diseases classified elsewhere without behavioral disturbance: Secondary | ICD-10-CM | POA: Diagnosis not present

## 2014-09-04 DIAGNOSIS — F17200 Nicotine dependence, unspecified, uncomplicated: Secondary | ICD-10-CM | POA: Diagnosis not present

## 2014-09-04 DIAGNOSIS — F319 Bipolar disorder, unspecified: Secondary | ICD-10-CM | POA: Diagnosis not present

## 2014-09-04 LAB — BASIC METABOLIC PANEL
Anion Gap: 9 (ref 7–16)
BUN: 21 mg/dL — ABNORMAL HIGH (ref 7–18)
Calcium, Total: 8.7 mg/dL (ref 8.5–10.1)
Chloride: 103 mmol/L (ref 98–107)
Co2: 24 mmol/L (ref 21–32)
Creatinine: 1.26 mg/dL (ref 0.60–1.30)
EGFR (African American): >60
EGFR (Non-African Amer.): >60
Glucose: 83 mg/dL (ref 65–99)
Osmolality: 274 (ref 275–301)
Potassium: 3.5 mmol/L (ref 3.5–5.1)
Sodium: 136 mmol/L (ref 136–145)

## 2014-09-09 DIAGNOSIS — M94261 Chondromalacia, right knee: Secondary | ICD-10-CM | POA: Diagnosis not present

## 2014-09-09 DIAGNOSIS — M25561 Pain in right knee: Secondary | ICD-10-CM | POA: Diagnosis not present

## 2014-09-17 DIAGNOSIS — F17200 Nicotine dependence, unspecified, uncomplicated: Secondary | ICD-10-CM | POA: Diagnosis not present

## 2014-09-17 DIAGNOSIS — I639 Cerebral infarction, unspecified: Secondary | ICD-10-CM | POA: Diagnosis not present

## 2014-09-17 DIAGNOSIS — I10 Essential (primary) hypertension: Secondary | ICD-10-CM | POA: Diagnosis not present

## 2014-09-17 DIAGNOSIS — E785 Hyperlipidemia, unspecified: Secondary | ICD-10-CM | POA: Diagnosis not present

## 2014-10-10 ENCOUNTER — Other Ambulatory Visit: Payer: Self-pay | Admitting: *Deleted

## 2014-10-10 MED ORDER — ASPIRIN 325 MG PO TBEC: 325.0000 mg | DELAYED_RELEASE_TABLET | Freq: Every day | ORAL | Status: DC

## 2014-11-11 DIAGNOSIS — E871 Hypo-osmolality and hyponatremia: Secondary | ICD-10-CM | POA: Diagnosis not present

## 2014-11-11 DIAGNOSIS — N503 Cyst of epididymis: Secondary | ICD-10-CM | POA: Diagnosis not present

## 2014-11-11 DIAGNOSIS — J449 Chronic obstructive pulmonary disease, unspecified: Secondary | ICD-10-CM | POA: Diagnosis not present

## 2014-11-11 DIAGNOSIS — M799 Soft tissue disorder, unspecified: Secondary | ICD-10-CM | POA: Diagnosis not present

## 2014-11-11 DIAGNOSIS — R195 Other fecal abnormalities: Secondary | ICD-10-CM | POA: Diagnosis not present

## 2014-11-11 DIAGNOSIS — L0291 Cutaneous abscess, unspecified: Secondary | ICD-10-CM | POA: Diagnosis not present

## 2014-11-11 DIAGNOSIS — R911 Solitary pulmonary nodule: Secondary | ICD-10-CM | POA: Diagnosis not present

## 2014-11-11 DIAGNOSIS — I10 Essential (primary) hypertension: Secondary | ICD-10-CM | POA: Diagnosis not present

## 2014-11-11 DIAGNOSIS — F17210 Nicotine dependence, cigarettes, uncomplicated: Secondary | ICD-10-CM | POA: Diagnosis not present

## 2014-11-11 DIAGNOSIS — N492 Inflammatory disorders of scrotum: Secondary | ICD-10-CM | POA: Diagnosis not present

## 2014-11-12 DIAGNOSIS — J431 Panlobular emphysema: Secondary | ICD-10-CM | POA: Insufficient documentation

## 2014-11-12 DIAGNOSIS — N492 Inflammatory disorders of scrotum: Secondary | ICD-10-CM | POA: Diagnosis not present

## 2014-11-12 DIAGNOSIS — E871 Hypo-osmolality and hyponatremia: Secondary | ICD-10-CM | POA: Diagnosis not present

## 2014-11-12 DIAGNOSIS — I10 Essential (primary) hypertension: Secondary | ICD-10-CM | POA: Diagnosis not present

## 2014-11-12 DIAGNOSIS — J9610 Chronic respiratory failure, unspecified whether with hypoxia or hypercapnia: Secondary | ICD-10-CM | POA: Insufficient documentation

## 2014-11-12 DIAGNOSIS — F17210 Nicotine dependence, cigarettes, uncomplicated: Secondary | ICD-10-CM | POA: Diagnosis not present

## 2014-11-21 DIAGNOSIS — D649 Anemia, unspecified: Secondary | ICD-10-CM | POA: Diagnosis not present

## 2014-11-21 DIAGNOSIS — N185 Chronic kidney disease, stage 5: Secondary | ICD-10-CM | POA: Diagnosis not present

## 2014-11-29 DIAGNOSIS — Z7409 Other reduced mobility: Secondary | ICD-10-CM | POA: Diagnosis not present

## 2014-11-29 DIAGNOSIS — N492 Inflammatory disorders of scrotum: Secondary | ICD-10-CM | POA: Diagnosis not present

## 2014-11-29 DIAGNOSIS — Z8673 Personal history of transient ischemic attack (TIA), and cerebral infarction without residual deficits: Secondary | ICD-10-CM | POA: Diagnosis not present

## 2014-11-29 DIAGNOSIS — R7309 Other abnormal glucose: Secondary | ICD-10-CM | POA: Diagnosis not present

## 2014-11-29 DIAGNOSIS — I10 Essential (primary) hypertension: Secondary | ICD-10-CM | POA: Diagnosis not present

## 2014-11-29 DIAGNOSIS — Z1211 Encounter for screening for malignant neoplasm of colon: Secondary | ICD-10-CM | POA: Diagnosis not present

## 2014-11-29 DIAGNOSIS — E785 Hyperlipidemia, unspecified: Secondary | ICD-10-CM | POA: Diagnosis not present
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
## 2014-11-29 NOTE — Discharge Summary (Signed)
PATIENT NAME:  David Hall, WAAGE MR#:  [id number] DATE OF BIRTH:  1956-02-06  DATE OF ADMISSION:  08/06/2012 DATE OF DISCHARGE:  08/08/2012  HOSPITAL COURSE:  See dictated history and physical for details of admission.  This 59 year old man with long-standing history of schizophrenia was admitted to the hospital reporting that his mood was worse and he was having suicidal thoughts and auditory hallucinations.  It appears that he had probably been dabbling in cocaine.  He himself minimized that and remained focused on the idea that the problem was that his blood pressure was too high.  He was completely compliant with medication while he was in the hospital.  Pleasant affect, showed no dangerous behavior.  Quickly returned to his baseline and was able to hold a lucid conversation without disorganized or paranoid thinking.  He denied any suicidal ideation.  At the time of discharge, he was completely agreeable to continuing with outpatient treatment in the community as previously.  He was counseled about the importance of staying off of drugs and alcohol and staying on his medicine.   DISCHARGE MEDICATIONS:  Verapamil extended release 240 mg p.o. q.a.m., trazodone 300 mg p.o. at bedtime, risperidone 2 mg p.o. b.i.d., Seroquel 300 mg at bedtime, lisinopril 30 mg p.o. daily, hydrochlorothiazide 25 mg p.o. q.a.m., Prozac 10 mg p.o. q.a.m., vitamin B12 1000 mcg per day, Lipitor 40 mg p.o. at bedtime, nitroglycerin tablets 0.4 mg sublingual p.r.n. for chest pain, chewable aspirin 81 mg per day.   LABORATORY RESULTS:  Admission labs showed an EKG that was unremarkable.  CBC showed an elevated white count at 13.7.  Chemistry showed a sodium low at 135.  Calcium low at 8.4, alkaline phosphatase elevated at 164.  Alcohol level negative.  TSH normal.  Urinalysis positive for protein, otherwise unremarkable.  Drug screen positive for cocaine.  Rule out for an MI was normal.   MENTAL STATUS EXAMINATION:  Disheveled,  somewhat chronically poor hygiene gentleman, pleasant and cooperative in the interview.  Good eye contact.  Slightly fidgety psychomotor activity.  Speech normal in rate, tone and volume.  Affect constricted, but positive.  Mood stated as being good.  Thoughts were simple and concrete, but generally lucid.  No sign of bizarre or delusional thinking.  Denied auditory or visual hallucinations.  Denied suicidal or homicidal ideation.  Showed improved judgment and insight.  Baseline intelligence low average, impaired by psychosis.  Memory intact.   DISPOSITION:  Discharged to the community.    FOLLOWUP:  With (Dictation Anomaly) <<SIMRUN>>.   DIAGNOSIS PRINCIPLE AND PRIMARY:  AXIS I:  Schizophrenia, undifferentiated.   SECONDARY DIAGNOSES: AXIS I:  Cocaine abuse.  AXIS II:  Deferred.  AXIS III:  Hypertension, history of atypical chest pain, dyslipidemia.  AXIS IV:  Severe from chronic loneliness and burden of illness.  AXIS V:  Functioning at time of discharge 3.     ____________________________ Gonzella Lex, MD jtc:ea D: 08/15/2012 22:59:33 ET T: 08/16/2012 04:27:10 ET JOB#: [id number]  cc: Gonzella Lex, MD, <Dictator> Gonzella Lex MD ELECTRONICALLY SIGNED 08/16/2012 12:44

## 2014-11-30 NOTE — H&P (Signed)
PATIENT NAME:  David Hall, David Hall MR#:  [id number] DATE OF BIRTH:  14-Mar-1956  DATE OF ADMISSION:  09/20/2013  ADDENDUM  CT scan of the chest was resulted. Small pulmonary emboli in the left lower lobe superior segment pulmonary artery branches underlying emphysema, pulmonary nodule lesions largest in the inferior lingula irregular in contour and concerning for neoplasia, ground-glass opacity containing several subcentimeter nodules in the superior segment of the right lower lobe, questionable infectious pneumonia versus atypical neoplasm.   With the results of this test, I will get more aggressive and treat pneumonia with Rocephin and Zithromax. I will start heparin drip for the pulmonary embolism. I will get a pulmonary consultation for the lung masses seen on the CAT scan, largest 2.2 x 2.2 cm. This will be difficult to assess because the patient will need a blood thinner for the pulmonary embolism but will probably end up needing a biopsy or a CT PET scan to evaluate for cancerous process. We will have pulmonary help out with the decision-making process.  TIME SPENT ON RE-EVALUATION:  15 minutes.  The case discussed with the patient and family.    ____________________________ Tana Conch. Leslye Peer, MD rjw:ce D: 09/20/2013 14:11:29 ET T: 09/20/2013 14:40:05 ET JOB#: [id number]  cc: Tana Conch. Leslye Peer, MD, <Dictator> Marisue Brooklyn MD ELECTRONICALLY SIGNED 09/29/2013 12:22

## 2014-11-30 NOTE — Discharge Summary (Signed)
PATIENT NAME:  David Hall, David Hall MR#:  [id number] DATE OF BIRTH:  June 18, 1956  DATE OF ADMISSION:  09/20/2013 DATE OF DISCHARGE:  09/21/2013  The patient signed against medical advice, so no final discharge instructions were given.   HISTORY OF PRESENTING ILLNESS: A 59 year old male who had history of chest pain and shortness breath, was going on for 6 weeks. He had complaint of cannot breathe, had been coughing up greenish phlegm and having chest pain like someone was sitting on the chest, 8 out of 10 in intensity, for the past 6 weeks. In the Emergency Room, he was any wheezing and had elevated respiratory rate, so hospitalist service was contacted.   HOSPITAL COURSE AND STAY: Admitted for acute on chronic respiratory failure, chest pain and shortness of breath. On CT scan of the chest, he was found having pulmonary embolism and some lung nodules. Because of lung nodules, pulmonary consult was done, and Dr. Devona Konig saw him, suggested to have PET scan. He was started on heparin IV drip. Next day in the morning, the patient was feeling totally fine, and so he did not want to wait in the hospital to get further work-up done. Because of a finding of 2 cm nodule in the lung and other multiple smaller nodules, chances were high of having malignant disease, and so I recommend him to get it as an inpatient. He refused, and wanted to go home and I signed against medical advice. For pulmonary embolism, he was started heparin drip. I gave him prescription of Xarelto. Advised to stop taking tegretol and speak to PMD for any alternative as it has interaction with Xarelto.  I advised him strongly to follow with his primary care physician as soon as possible. I also gave him a CT scan of chest report and number of Dr. Devona Konig, so he can either go to his primary care physician or with Dr. Derrek Gu office.   OTHER MEDICAL ISSUES: 1.  Hypertension.  2.  Hyperlipidemia.  3.  Bipolar disorder.    Will  continue on his baseline home medications except tegretol- which is for psych reasons. smoking cessation counseling was done. He refused nicotine patch or nicotine inhaler in the hospital.   TOTAL TIME SPENT: 35 minutes.   ____________________________ Ceasar Lund Anselm Jungling, MD vgv:cg D: 09/25/2013 00:39:02 ET T: 09/25/2013 04:33:27 ET JOB#: [id number]  cc: Ceasar Lund. Anselm Jungling, MD, <Dictator>   Vaughan Basta MD ELECTRONICALLY SIGNED 10/01/2013 12:21

## 2014-11-30 NOTE — H&P (Signed)
PATIENT NAME:  David Hall, SIMONICH MR#:  [id number] DATE OF BIRTH:  [birth year]  DATE OF ADMISSION:  09/20/2013  PRIMARY CARE PHYSICIAN:  Dr. Ellene Route  CHIEF COMPLAINT: Chest pain and shortness of breath.   HISTORY OF PRESENT ILLNESS: This is a 59 year old man who has been having chest pain and shortness of breath going on for 6 weeks. He states he cannot breathe. He has been coughing up greenish phlegm. He has been having chest pain like something sitting on his chest, 8/10 in intensity. It has been constant over the last 6 weeks. He also had on and off sharp pains in his chest. His blood pressure has been up and down and Dr. Ellene Route has been adjusting medications. He has had decreased energy several times. He has passed out while walking; last episode was last night where he passed out. In the ER he was wheezing initially and had an elevated respiratory rate. Hospitalist services were contacted for further evaluation.   PAST MEDICAL HISTORY: Hypertension, hyperlipidemia, prediabetes and bipolar disorder.   PAST SURGICAL HISTORY: Elbow, wrist and 3 different locations for sweat gland removal.   ALLERGIES: No known drug allergies.   MEDICATIONS: Include Ambien 10 mg at bedtime, Benadryl 25 mg at bedtime, hydralazine 25 mg 3 times a day, hydrochlorothiazide, lisinopril 12.5/20 two tablets daily, Imdur 30 mg daily, Lipitor 40 mg at bedtime, metoprolol tartrate 100 mg twice a day, nitroglycerin as needed for chest pain, omeprazole 20 mg daily, Prozac 10 mg 3 times a day, Qvar 1 puff twice a day, Tegretol 200 mg in the morning 400 mg at night, Valium 4 times a day as needed for anxiety.   SOCIAL HISTORY: Smoker 1 pack per day when he has money, quarter pack per day when he does not have money. Positive for crack cocaine use, the last 2 days ago. No alcohol. Lives with his daughter, is on disability.   FAMILY HISTORY: Mother had MI, CABG, CVA and bleeding. Father died of a respiratory issue and had  diabetes.  REVIEW OF SYSTEMS:  CONSTITUTIONAL: Low-grade fever. No chills. No sweats. No energy. No weight loss. No weight gain.  EYES: He does wear glasses. EARS, NOSE, MOUTH AND THROAT: Decreased hearing. No sore throat. No difficulty swallowing.  CARDIOVASCULAR: Positive for chest pain. No palpitations.  RESPIRATORY: Positive for shortness of breath, greenish phlegm. Positive for cough. No hemoptysis.  GASTROINTESTINAL: Positive for nausea and vomiting. Positive for abdominal pain. No diarrhea. No constipation. No bright red blood per rectum. No melena.  GENITOURINARY: No burning on urination. No hematuria.  MUSCULOSKELETAL: Positive for joint pain all over.  INTEGUMENT: No rashes or eruptions.  NEUROLOGIC: Positive for passing out episodes.  PSYCHIATRIC: Positive for anxiety.  ENDOCRINE: No thyroid problems. HEMATOLOGIC AND LYMPHATIC: No anemia. No easy bruising or bleeding.   PHYSICAL EXAMINATION: VITAL SIGNS: Temperature 99.6, pulse 66, respirations 33, blood pressure 190/96, pulse ox 99% on room air.  GENERAL: The patient in slight respiratory distress using accessory muscles to breathe.  EYES: Conjunctivae and lids normal. Pupils equal, round and reactive to light. Extraocular muscles intact. No nystagmus. EARS, NOSE, MOUTH AND THROAT: Tympanic membranes: Slight erythematous and bulging. Nasal mucosa: No erythema. Throat: Positive erythema. No exudate seen. Lips and gums: No lesions.  NECK: No JVD. No bruits. No lymphadenopathy. No thyromegaly. No thyroid nodules palpated.  RESPIRATORY: Positive use of accessory muscles to breathe, decreased breath sounds bilaterally, slight expiratory wheeze.  CARDIOVASCULAR:  S1, S2 normal. No gallops, rubs or murmurs heard.  Carotid upstroke 2+ bilaterally. No bruits. EXTREMITIES:  Dorsalis pedis pulses 2+ bilaterally, 2+ edema bilateral lower extremity.  ABDOMEN: Soft, slight tenderness throughout. No organosplenomegaly. Normoactive bowel sounds.  No masses felt.  LYMPHATIC: No lymph nodes in the neck.  MUSCULOSKELETAL: No clubbing. Trace edema. No cyanosis.  SKIN: No ulcers or lesions seen.  NEUROLOGIC: Cranial nerves II through XII grossly intact. Deep tendon reflexes 1+ bilateral lower extremity.  PSYCHIATRIC: The patient is oriented to person, place and time.   LABORATORY AND RADIOLOGICAL DATA: BNP 1167. Urine toxicology positive for cocaine and benzodiazepine. Urinalysis negative. Troponin negative. Glucose 81, BUN 16, creatinine 1.01. Sodium 138, potassium 3.5, chloride 105, CO2 of 26, calcium 8.5. Liver function tests normal range. White blood cell count 9.0, H and H 12.8 and 37.2, platelet count of 237.  Chest x-ray showed mild cardiomegaly without edema, lingular subsegmental atelectasis or scarring.   ASSESSMENT AND PLAN: 1.  Chest pain with shortness of breath, syncope and abnormal EKG. I will obtain a CT scan of the chest to rule out pulmonary embolism. Most likely this is chronic obstructive pulmonary disease exacerbation. We will give nebulizers, Advair and Zithromax. I will get a cardiology consultation. Because of the patient's abnormal EKG, I will admit to telemetry. Get serial cardiac enzymes. Continue aspirin and metoprolol. With the patient's tachypnea and increased respiratory rate meets clinical criteria for acute respiratory failure. We will continue oxygen supplementation.  2.  Hypertension. Continue usual medications. I think once his breathing gets better, his blood pressure will be more controlled.  3.  Hyperlipidemia. Continue Lipitor.  4.  Bipolar disorder. Continue psychiatric medications.  5.  Tobacco abuse. Smoking cessation counseling done 3 minutes by me. Refused nicotine patch or Nicotrol inhaler.  6.  Crack cocaine abuse: This needs to stop. I have seen somebody with crack lung that needs to be intubated. We will get further evaluation with CT scan of the chest.  TIME SPENT ON ADMISSION: 55 minutes.     ____________________________ Tana Conch. Leslye Peer, MD rjw:ce D: 09/20/2013 14:07:51 ET T: 09/20/2013 14:23:21 ET JOB#: [id number]  cc: Tana Conch. Leslye Peer, MD, <Dictator> Raye Sorrow, MD Marisue Brooklyn MD ELECTRONICALLY SIGNED 09/29/2013 12:22

## 2014-11-30 NOTE — Discharge Summary (Signed)
PATIENT NAME:  David Hall, COLSON MR#:  [id number] DATE OF BIRTH:  1956-05-27  DATE OF ADMISSION:  09/26/2013 DATE OF DISCHARGE:  09/28/2013   For a detailed note, please take a look at the history and physical done on admission by Dr. Waldron Labs.  DIAGNOSES AT DISCHARGE: As follows:  1. Acute cerebrovascular accident.  2. Slurred speech and right-sided weakness secondary to the acute cerebrovascular accident.  3. Chronic obstructive pulmonary disease.  4. Accelerated hypertension.  5. Hyperlipidemia.  6. Gastroesophageal reflux disease.  7. Depression.   DIET: The patient is being discharged on a mechanical soft diet with strict aspiration precautions. He needs to be on a low-sodium, low-fat diet.   ACTIVITIES: As tolerated.   FOLLOWUP: With Dr. Raye Sorrow in the next 1 to 2 weeks.   DISPOSITION: The patient is being discharged to an acute inpatient rehab facility at [facility].   DISCHARGE MEDICATIONS: As follows:  1. Tegretol 200 mg 1 tab in the morning and 2 tabs at bedtime. 2. Benadryl 25 mg at bedtime. 3. Ambien 10 mg daily at bedtime.  4. Omeprazole 20 mg daily. 5. Imdur 30 mg daily. 6. Valium 10 mg b.i.d. 7. Fluoxetine 20 mg daily.  8. Sublingual nitroglycerin as needed. 9. Hydrochlorothiazide/lisinopril 20/12.5 one tab daily.  10. Atorvastatin 40 mg at bedtime.  11. Metoprolol tartrate 150 mg b.i.d. 12. Aspirin 81 mg daily.   [place] COURSE: David Hall. David Julian, MD, from neurology.   PERTINENT STUDIES DONE DURING THE HOSPITAL COURSE: Are as follows:  A CT scan of the head done on admission without contrast showing no acute intracranial process.  An ultrasound of the carotids done showing no hemodynamically significant carotid artery stenosis with less than 50% stenosis in both internal carotid vessels.  A chest x-ray done on admission showing borderline cardiomegaly, but no other acute cardiopulmonary process.  An MRI of the brain showing  acute lacunar infarct in the left corona radiata involving the posterior lentiform nuclei and also of the portion of the left internal capsule. No mass effect of hemorrhage. Small subcortical white matter infarct also suggestive in the left anterior frontal lobe. Chronic small vessel ischemia.  A 2-dimensional echocardiogram done showing ejection fraction of 50% to 55%, normal global LV function, mild LVH, mildly dilated left atrium, mild mitral valve regurgitation.  Urine toxicology positive for cocaine and opiates.   HOSPITAL COURSE: This is a 59 year old male with medical problems as mentioned above, who presented to the hospital on 09/26/2013 due to slurred speech and right-sided weakness.   1. An acute cerebrovascular accident. This was likely the cause of the patient's slurred speech and right-sided weakness. The patient had a CT head on admission which did not show any acute abnormalities, but the MRI did confirm an acute stroke, as mentioned above. The patient was started on aspirin, and he was not taking any anticoagulants prior to coming in. He was already on a statin. A speech, physical therapy and occupational therapy evaluations were all obtained along with a neurology evaluation. The patient was seen by neurology and agreed with the management at this point. After being seen by speech, physical therapy and OT, they thought he would benefit from acute inpatient rehab, which is what he currently is being discharged to. The patient has been tolerating all the therapies here fairly well.  2. Hypertension. The patient has accelerated uncontrolled hypertension, likely is somewhat related to the acute stroke and also somewhat probably related to medical noncompliance. The  patient has been controlled here in the hospital with Imdur, lisinopril, hydrochlorothiazide and some p.r.n. IV hydralazine, although he will resume all his home medications and further titrations to his antihypertensive medications  can be done at the acute inpatient rehab.  3. Gastroesophageal reflux disease. The patient was maintained on his Protonix. He will resume that.  4. Bipolar disorder. The patient has been maintained on his Tegretol and Prozac, and he will also resume that upon discharge.   CODE STATUS: The patient is a full code.   DISPOSITION: He is being discharged to an acute inpatient rehab as mentioned.   TIME SPENT ON DISCHARGE: 40 minutes.   ____________________________ Belia Heman. Verdell Carmine, MD vjs:lb D: 09/28/2013 09:50:59 ET T: 09/28/2013 10:12:36 ET JOB#: [id number]  cc: Belia Heman. Verdell Carmine, MD, <Dictator> Raye Sorrow, MD Henreitta Leber MD ELECTRONICALLY SIGNED 10/05/2013 18:04

## 2014-11-30 NOTE — H&P (Signed)
PATIENT NAME:  David Hall, David Hall MR#:  [id number] DATE OF BIRTH:  07/21/1956  DATE OF ADMISSION:  09/26/2013  REFERRING PHYSICIAN:  Dr. Lurline Hare  PRIMARY CARE PHYSICIAN: Dr.  Ellene Route.    CHIEF COMPLAINT:  Right side weakness, slurred speech.   HISTORY OF PRESENT ILLNESS: This is a 59 year old male who has a history of signing out against medical advice from [facility] where he was admitted initially for treatment of PE. The patient was admitted on 02/12 for chest pain and shortness of breath. His CT angio chest showed evidence of PE.  The patient signed out against medical advise. He did not want any anticoagulation. As well, he had pulmonary nodule where he was supposed to follow with Dr. Devona Konig and obtain a PET scan as an outpatient. The patient reports he went to [facility] to the ED where he had a CT scan, where they told him he did not have any PE. Currently we requested his medication record from [facility] it should be here any time.  The patient presents with complaint of right-sided weakness and slurred speech. The patient reports this weakness started at 3:00 p.m. yesterday, but he did not call EMS and he did not notify anybody about it because he felt he could get over it.  With no improvement, he called EMS eventually.  Upon presentation to the ED, the patient already had his symptoms going on for more than 12 hours. He new side weakness in the right upper and lower extremity with impaired slurred speech, and unsteady gait. He denies fever, chills, chest pain, neck pain, back pain, fainting, seizures loss of consciousness. He reports headache. The patient was given 324 mg of p.o. aspirin as he passed initial swallow test by ED. The patient's CT head did not show an acute findings. As well, the patient is known to have history of cocaine abuse. He reports he smoked cocaine before two days.   PAST MEDICAL HISTORY: 1.  Hypertension.  2.  Hyperlipidemia.  3.  Prediabetes.  4.   Bipolar disorder.  5.  Questionable history of PE.   PAST SURGICAL HISTORY:  Elbow and wrist surgery and with three different locations for sweat gland removal.   ALLERGIES: No known drug allergies.   HOME MEDICATIONS:   1.  Imdur 30 mg oral daily. 2.  Sublingual nitroglycerin as needed.  3.  Tegretol 200 mg oral 1 tablet every morning and 2 tablets at bedtime.  4.  Valium 10 mg oral 2 times a day as needed.  5.  Fluoxetine 20 mg oral daily.  6.  Benadryl 25 mg oral daily.  7.  Atorvastatin 40 mg oral daily.  8.  Hydrochlorothiazide/lisinopril 12.5/20 mg oral daily.  9.  Ambien 10 mg at bedtime.  10.  Metoprolol tartrate 100 mg oral 2 times a day.  11.  Omeprazole 20 mg oral daily.   SOCIAL HISTORY: Smokes 1 pack per day. Uses crack cocaine, last time two days before presentation. Denies any alcohol use.   FAMILY HISTORY: Significant for coronary artery disease and CVA in the family.   REVIEW OF SYSTEMS:  CONSTITUTIONAL:  Denies fever, chills. No weight gain or weight loss.   EYES:  Denies blurry vision or double vision, inflammation, glaucoma.   ENT:  Denies tinnitus, early pain, epistaxis or discharge. RESPIRATORY:  Denies cough, wheezing, hemoptysis, COPD. CARDIOVASCULAR:  Denies chest pain, edema, palpations, syncope. GASTROINTESTINAL: Denies nausea, vomiting, diarrhea, abdominal pain, hematemesis.  GENITOURINARY: Denies dysuria, hematuria,  renal colic.  ENDOCRINE: Denies polyuria, polydipsia, heat or cold intolerance.  HEMATOLOGY: Denies anemia, easy bruising, bleeding diathesis.  INTEGUMENTARY: Denies acne, rash or skin lesion.  MUSCULOSKELETAL: Denies any neck pain, shoulder pain, back pain, gout or swelling. NEUROLOGIC:  Reports right, weakness, headache and slurred speech.  PSYCHIATRIC: Denies anxiety, insomnia, bipolar disorder. Reports substance abuse, cocaine.   PHYSICAL EXAMINATION: VITAL SIGNS: Temperature 97.6, pulse 60, respiratory rate 20, blood pressure  199/99.  Satting 97% on room air.  GENERAL: Well-nourished male who looks comfortable, in no apparent distress.  HEENT: Has right-sided facial droop. Pink conjunctivae. Anicteric sclerae. Moist oral mucosa.  NECK: Supple. No thyromegaly. No JVD.  CHEST: Good air entry bilaterally. No wheezing, rales, rhonchi.  CARDIOVASCULAR: S1, S2 heard. No rubs, murmur or gallops.  ABDOMEN: Soft, nontender, nondistended. Bowel sounds present.  EXTREMITIES: No edema. No clubbing. No cyanosis. Pedal pulses felt bilaterally.  PSYCHIATRIC: Appropriate affect. Awake, alert x 3. Intact judgment and insight. NEUROLOGIC:  The patient has total right side paralysis, 0 out of 5 upper and lower extremity, as well had slurred speech and mild right facial droop.  Left upper and lower extremity 5 out of 5 motor strength. No focal deficits.  SKIN: Warm and dry. Normal skin turgor.  LYMPHATIC: No cervical lymphadenopathy could be appreciated.  MUSCULOSKELETAL: No joint effusion or erythema.   PERTINENT LABORATORY DATA: Glucose 86, BUN 11, creatinine 1.12, sodium 139, potassium 3.3, chloride 106, CO2 30, ALT 15, AST 19, alkaline phosphatase 108. Troponin 0.02. Urine drugs positive for cocaine and benzodiazepine. Hematocrit 36.3, hemoglobin 12.5. White blood cells 9.3, platelet 230. Urinalysis negative for leukocyte esterase and nitrite.   CT head: No acute intracranial abnormality.   ASSESSMENT AND PLAN: 1.  Acute cerebrovascular accident.  The patient presents with symptoms of acute cerebrovascular accident with right side hemiparalysis. Already given 324 mg of aspirin in the ED after he passed initial swallow evaluation by the ED staff. The patient will be admitted to telemetry unit. We will consult Neurology Service.  We will consult physical therapy, swallow evaluation for the patient.  Meanwhile he will be kept n.p.o. except medications. We will obtain an MRI of the brain. We will check  carotid Doppler and obtain a 2-D  echocardiogram. It is unclear if the patient needs echocardiogram with bubble study to evaluate for any patent foramen ovale, especially with questionable history of pulmonary embolus and we will allow for permissive hypertension.  2.  Questionable history of pulmonary embolus.  We will obtain the patient's records from [facility] to clarify if the repeat CT angiogram there does not show any pulmonary embolus or not. It depending on the records we will obtain, then we will discuss with Neurology if it is safe to presume this patient on anticoagulation with acute onset of stroke out of fear for hemo conversion of his acute stroke.  3.  Cocaine abuse. The patient was counseled.  4.  Hypertension. We will hold all medications as we will allow for permissive hypertension for the first 48 hours.  5.  Tobacco abuse: The patient was counseled.  6.  Hyperlipidemia. The patient can be continued on statin.  7.  Deep vein thrombosis prophylaxis. Subcutaneous heparin.  8.  CODE STATUS: Discussed with the patient. He is a FULL CODE.   TOTAL TIME SPENT ON ADMISSION AND PATIENT CARE: 55 minutes.    ____________________________ Albertine Patricia, MD dse:dp D: 09/26/2013 06:53:00 ET T: 09/26/2013 08:11:35 ET JOB#: [id number]  cc: Albertine Patricia,  MD, <Dictator> Amelio Brosky Graciela Husbands MD ELECTRONICALLY SIGNED 10/04/2013 0:47

## 2014-11-30 NOTE — Consult Note (Signed)
PATIENT NAME:  David Hall, David Hall MR#:  [id number] DATE OF BIRTH:  1956/07/26  DATE OF CONSULTATION:  09/20/2013  REFERRING PHYSICIAN:  Dr. Leslye Peer  CONSULTING PHYSICIAN:  Corey Skains, MD  REASON FOR CONSULTATION: Acute chest pain, shortness of breath with abnormal EKG.   CHIEF COMPLAINT: "I'm short of breath."   HISTORY OF PRESENT ILLNESS: This is a 59 year old male with known hypertension, hyperlipidemia with acute onset of severe shortness of breath, weakness and fatigue and seen with an EKG showing normal sinus rhythm with nonspecific T wave inversion. The patient has had no evidence of acute bronchitis, although some cough and congestion have been occurring. He has had CT scan now showing a small pulmonary embolism and some mild hypoxia. He has had some chest painwith this, possibly consistent with anginal equivalent, although current troponin shows normal troponin and a BNP is 1167. The patient also has had slight amount of anemia with hemoglobin of 12.8. The patient currently is improving with inhalers.   REVIEW OF SYSTEMS: The remainder of review of systems negative for vision change, ringing of the ears, hearing loss, nausea, vomiting, diarrhea, bloody stools, stomach pain, extremity pain, leg weakness, cramping of the buttocks, known blood clots, headaches, blackouts, dizzy spells, nosebleeds, frequent urination, urination at night, muscle weakness, numbness, anxiety, depression, skin lesions or skin rashes.   PAST MEDICAL HISTORY: 1.  Hypertension.  2.  Hyperlipidemia.   FAMILY HISTORY: Mother had early onset of cardiovascular disease.   SOCIAL HISTORY: Currently, the patient does have some history of tobacco and alcohol use.   ALLERGIES: As listed.   MEDICATIONS: As listed.   PHYSICAL EXAMINATION: VITAL SIGNS: Blood pressure is 126/68 bilaterally, heart rate 72 upright, reclining and regular.  GENERAL: He is a well-appearing male in no acute distress.  HEAD, EYES, EARS,  NOSE, THROAT: No icterus, thyromegaly, ulcers, hemorrhage or xanthelasma.  CARDIOVASCULAR: Regular rate and rhythm. Normal S1 and S2. Distant heart sounds. PMI is diffuse.  NECK: Carotid upstroke normal without bruit. Jugular venous pressure is normal.  LUNGS: Have expiratory wheezes and diffuse rhonchi.  ABDOMEN: Soft, nontender without hepatosplenomegaly or masses. Abdominal aorta is normal size without bruit.  EXTREMITIES: Show 2+ bilateral pulses in dorsal, pedal, radial and femoral arteries without lower extremity edema, cyanosis, clubbing or ulcers.  NEUROLOGIC: He is oriented to time, place and person with normal mood and affect.   ASSESSMENT: A 59 year old male with hypertension, hyperlipidemia, tobacco abuse, family history of cardiovascular disease with acute chest pain without evidence of myocardial infarction and pulmonary embolism by CAT scan, needing further treatment options.   RECOMMENDATIONS: 1.  Continue serial ECG and enzymes to assess for possible myocardial infarction.  2. Echocardiogram for LV systolic dysfunction, right ventricular strain from pulmonary embolism.  3.  Oxygenation.  4.  Heparin for further risk reduction of PE.  5.  Antibiotics for possible bronchitis.  6.  Further cardiac diagnostic testing and treatment options after above.     ____________________________ Corey Skains, MD bjk:dmm D: 09/20/2013 12:44:08 ET T: 09/20/2013 13:10:25 ET JOB#: [id number]  cc: Corey Skains, MD, <Dictator> Corey Skains MD ELECTRONICALLY SIGNED 09/23/2013 17:13

## 2014-11-30 NOTE — Consult Note (Signed)
Referring Physician:  Albertine Patricia :   Reason for Consult: Admit Date: 26-Sep-2013  Chief Complaint: R sided weakness  Reason for Consult: CVA   History of Present Illness: History of Present Illness:   59 yo RHD M presents to [facility] after waking up yesterday and not being able to move his R side.  Pt has never had anything like this previous.  Pt woke up with complaints so was not a tPA candidate.  Pt denies headache but does not taking cocaine the night before this occured.  ROS:  General weakness   HEENT no complaints   Lungs no complaints   Cardiac no complaints   GI no complaints   GU no complaints   Musculoskeletal no complaints   Extremities no complaints   Skin no complaints   Neuro R sided weakness   Endocrine no complaints   Psych no complaints   Past Medical/Surgical Hx:  prediabetes:   Bipolar Disorder:   htn:   high cholesterol:   left elbow:   left carpal tunnel:   Past Medical/ Surgical Hx:  Past Medical History as above   Past Surgical History as above   Home Medications: Medication Instructions Last Modified Date/Time  Tegretol 200 mg oral tablet 1 tab(s) orally in the morning and 2 tabs at bedtime  18-Feb-15 04:27  Benadryl 25 mg oral capsule 1 cap(s) orally once a day (at bedtime) 18-Feb-15 04:27  Ambien 10 mg oral tablet 1 tab(s) orally once a day (at bedtime) 18-Feb-15 04:27  omeprazole 20 mg oral delayed release capsule 1 cap(s) orally once a day 18-Feb-15 04:27  Imdur 30 mg oral tablet, extended release 1 tab(s) orally once a day (in the morning) 18-Feb-15 04:27  Valium 10 mg oral tablet 1 tab(s) orally 2 times a day, As Needed 18-Feb-15 04:27  Metoprolol Tartrate 100 mg oral tablet 1.5 tab(s) orally 2 times a day 18-Feb-15 04:27  FLUoxetine 20 mg oral capsule 1 cap(s) orally once a day 18-Feb-15 04:27  Nitrostat 0.4 mg sublingual tablet 1 tab(s) sublingual every 5 minutes, As Needed 18-Feb-15 04:27   hydrochlorothiazide-lisinopril 12.5 mg-20 mg oral tablet 1 tab(s) orally once a day 18-Feb-15 04:27  atorvastatin 40 mg oral tablet 1 tab(s) orally once a day (at bedtime) 18-Feb-15 04:27   Allergies:  Codeine: Unknown  Allergies:  Allergies as above   Social/Family History: Employment Status: disabled  Lives With: alone  Living Arrangements: apartment  Social History: + tob, + cocaine, social EtOH, disabled  Family History: no stroke, + CAD   Vital Signs: **Vital Signs.:   19-Feb-15 06:44  Vital Signs Type Recheck  Pulse Pulse 80  Systolic BP Systolic BP 342  Diastolic BP (mmHg) Diastolic BP (mmHg) 83  Mean BP 117   Physical Exam: General: poorly kept, NAD, resting comfortable  HEENT: normocephalic, sclera nonicteric, oropharynx clear  Neck: supple, no JVD, no bruits  Chest: CTA B, no wheezing, good movement; frequent cough  Cardiac: RRR, no murmurs, no edema, 2+ pulses  Extremities: no C/C/E, FROM   Neurologic Exam: Mental Status: alert and oriented x 3, normal  language, follows complex commands, moderate dysarthria  Cranial Nerves: PERRLA, EOMI, nl VF, slight R facial droop, tongue midline, shoulder shrug equal  Motor Exam: 0/5 R UE, 1/5 R LE, 5/5L, nl tone  Deep Tendon Reflexes: 2+/4 B, plantars downgoing B, no Hoffman  Sensory Exam: pinprick, temperature, and vibration intact B  Coordination: FTN and HTS WNL on L, untestable on R  Lab Results: LabObservation:  18-Feb-15 10:44   OBSERVATION Reason for Test  Hepatic:  18-Feb-15 04:08   Bilirubin, Total  0.1  Alkaline Phosphatase 108 (45-117 NOTE: New Reference Range 06/29/13)  SGPT (ALT) 15  SGOT (AST) 19  Total Protein, Serum 6.9  Albumin, Serum 3.4  Routine Chem:  19-Feb-15 03:58   Cholesterol, Serum 182  Triglycerides, Serum  334  HDL (INHOUSE)  34  VLDL Cholesterol Calculated  67  LDL Cholesterol Calculated 81 (Result(s) reported on 27 Sep 2013 at 05:45AM.)  Glucose, Serum 82  BUN 8   Creatinine (comp) 0.95  Sodium, Serum 138  Potassium, Serum 3.6  Chloride, Serum 107  CO2, Serum 27  Calcium (Total), Serum 8.6  Anion Gap  4  Osmolality (calc) 273  eGFR (African American) >60  eGFR (Non-African American) >60 (eGFR values <106m/min/1.73 m2 may be an indication of chronic kidney disease (CKD). Calculated eGFR is useful in patients with stable renal function. The eGFR calculation will not be reliable in acutely ill patients when serum creatinine is changing rapidly. It is not useful in  patients on dialysis. The eGFR calculation may not be applicable to patients at the low and high extremes of body sizes, pregnant women, and vegetarians.)  Urine Drugs:  [id number]  Tricyclic Antidepressant, Ur Qual (comp) NEGATIVE (Result(s) reported on 26 Sep 2013 at 05:52AM.)  Amphetamines, Urine Qual. NEGATIVE  MDMA, Urine Qual. NEGATIVE  Cocaine Metabolite, Urine Qual. POSITIVE  Opiate, Urine qual NEGATIVE  Phencyclidine, Urine Qual. NEGATIVE  Cannabinoid, Urine Qual. NEGATIVE  Barbiturates, Urine Qual. NEGATIVE  Benzodiazepine, Urine Qual. POSITIVE (----------------- The URINE DRUG SCREEN provides only a preliminary, unconfirmed analytical test result and should not be used for non-medical  purposes.  Clinical consideration and professional judgment should be  applied to any positive drug screen result due to possible interfering substances.  A more specific alternate chemical method must be used in order to obtain a confirmed analytical result.  Gas chromatography/mass spectrometry (GC/MS) is the preferred confirmatory method.)  Methadone, Urine Qual. NEGATIVE  Cardiac:  18-Feb-15 04:08   Troponin I < 0.02 (0.00-0.05 0.05 ng/mL or less: NEGATIVE  Repeat testing in 3-6 hrs  if clinically indicated. >0.05 ng/mL: POTENTIAL  MYOCARDIAL INJURY. Repeat  testing in 3-6 hrs if  clinically indicated. NOTE: An increase or decrease  of 30% or more on serial  testing  suggests a  clinically important change)  Routine UA:  18-Feb-15 05:30   Color (UA) Yellow  Clarity (UA) Clear  Glucose (UA) Negative  Bilirubin (UA) Negative  Ketones (UA) Negative  Specific Gravity (UA) 1.015  Blood (UA) Negative  pH (UA) 6.0  Protein (UA) Negative  Nitrite (UA) Negative  Leukocyte Esterase (UA) Negative (Result(s) reported on 26 Sep 2013 at 05:48AM.)  RBC (UA) 1 /HPF  WBC (UA) 1 /HPF  Bacteria (UA) NONE SEEN  Epithelial Cells (UA) NONE SEEN  Mucous (UA) PRESENT  Hyaline Cast (UA) 1 /LPF (Result(s) reported on 26 Sep 2013 at 05:48AM.)  Routine Coag:  18-Feb-15 04:08   Prothrombin 12.9  INR 1.0 (INR reference interval applies to patients on anticoagulant therapy. A single INR therapeutic range for coumarins is not optimal for all indications; however, the suggested range for most indications is 2.0 - 3.0. Exceptions to the INR Reference Range may include: Prosthetic heart valves, acute myocardial infarction, prevention of myocardial infarction, and combinations of aspirin and anticoagulant. The need for a higher or lower target INR must be assessed individually. Reference:  The Pharmacology and Management of the Vitamin K  antagonists: the seventh ACCP Conference on Antithrombotic and Thrombolytic Therapy. ONGEX.5284 Sept:126 (3suppl): [id number]. A HCT value >55% may artifactually increase the PT.  In one study,  the increase was an average of 25%. Reference:  "Effect on Routine and Special Coagulation Testing Values of Citrate Anticoagulant Adjustment in Patients with High HCT Values." American Journal of Clinical Pathology 2006;126:400-405.)  Routine Hem:  19-Feb-15 03:58   WBC (CBC) 9.2  RBC (CBC)  4.16  Hemoglobin (CBC)  12.6  Hematocrit (CBC)  37.0  Platelet Count (CBC) 207  MCV 89  MCH 30.3  MCHC 34.1  RDW  15.8  Neutrophil % 69.1  Lymphocyte % 18.3  Monocyte % 8.3  Eosinophil % 3.7  Basophil % 0.6  Neutrophil # 6.4  Lymphocyte # 1.7   Monocyte # 0.8  Eosinophil # 0.3  Basophil # 0.1 (Result(s) reported on 27 Sep 2013 at 05:45AM.)   Radiology Results: [place]:    18-Feb-15 12:02, US Carotid Doppler Bilateral  US Carotid Doppler Bilateral   REASON FOR EXAM:    cva  COMMENTS:       PROCEDURE: [place]  - US CAROTID DOPPLER BILATERAL  - Sep 26 2013 12:02PM     CLINICAL DATA:  Cerebrovascular accident.    EXAM:  BILATERAL CAROTID DUPLEX ULTRASOUND    TECHNIQUE:  Pearline Cables scale imaging, color Dopplerand duplex ultrasound were  performed of bilateral carotid and vertebral arteries in the neck.    COMPARISON:  None.  FINDINGS:  Criteria: Quantification of carotid stenosis is based on velocity  parameters that correlate the residual internal carotid diameter  with NASCET-based stenosis levels, using the diameter of the distal  internal carotid lumen as the denominator for stenosis measurement.    The following velocity measurements were obtained:    RIGHT    ICA:  89/36 cm/sec    CCA:  13/24 cm/sec    SYSTOLIC ICA/CCA RATIO:  4.01  DIASTOLIC ICA/CCA RATIO:  0.27    ECA:  153 cm/sec    LEFT    ICA:  89/21 cm/sec    CCA:  25/36 cm/sec    SYSTOLIC ICA/CCA RATIO:  6.44    DIASTOLIC ICA/CCA RATIO:  0.34    ECA:  198 cm/sec  RIGHT CAROTID ARTERY:Mild plaque formation is noted in the distal  right common carotid artery and right carotid bulb, as well as the  proximal right internal carotid artery consistent with less than 50%  diameter stenosis based on ultrasound and Doppler criteria.    RIGHT VERTEBRAL ARTERY:  Antegrade flow is noted.    LEFT CAROTID ARTERY: Mild plaque formation is noted in the distal  left common carotid artery and left carotid bulb, as well as the  proximal left internal carotid artery consistent with less than 50%  diameter stenosis based on ultrasound and Doppler criteria.    LEFT VERTEBRAL ARTERY:  Antegrade flow is noted.     IMPRESSION:  Mild plaque formation is noted in both  proximal internal carotid  arteries consistent with less than 50% diameter stenosis  bilaterally.      Electronically Signed    By: Sabino Dick M.D.    On: 09/26/2013 12:09         Verified By: Marveen Reeks, M.D.,  MRI:    18-Feb-15 09:42, MRI Brain Without Contrast  MRI Brain Without Contrast   REASON FOR EXAM:    CVA  COMMENTS:  PROCEDURE: MR  - MR BRAIN WO CONTRAST  - Sep 26 2013  9:42AM     CLINICAL DATA:  59 year old male with slurred speech, right side  weakness, paralysis. Initial encounter.    EXAM:  MRI HEAD WITHOUT CONTRAST    TECHNIQUE:  Multiplanar, multiecho pulse sequences of the brain and surrounding  structures were obtained without intravenous contrast.  COMPARISON:  Head CT 09/26/2013.    FINDINGS:  14 mm round area of restricted diffusion in the left corona radiata  tracking caudally toward the external capsule, involving both the  lentiform nuclei and probably also portion of the posterior limb  internal capsule. Associated mild T2 and FLAIR hyperintensity. No  associated mass effect or hemorrhage.    There is also a small focus of subcortical white matter restricted  diffusion in the anterior left frontal lobe near the cingulate gyrus  suspected (series 5, image 16, series 9, image 29 and series 13,  image 29).    No contralateral or posterior fossa restricted diffusion. Major  intracranial vascular flow voids are preserved.    Underlying chronic lacunar infarct of the medial right thalamus.  Patchy right cerebral white matter T2 and FLAIR hyperintensity. No  superimposed cortical encephalomalacia. Brainstem and cerebellum  within normal limits. No midline shift, mass effect, evidence of  mass lesion, ventriculomegaly, extra-axial collection or acute  intracranial hemorrhage. Cervicomedullary junction and pituitary are  within normal limits. Negative visualized cervical spine. Normal  bone marrow signal.    Visualized orbit soft  tissues are within normal limits. Minor  paranasal sinus mucosal thickening. Grossly normal internal auditory  structures.   IMPRESSION:  1. Acute lacunar infarct of the left corona radiata involving the  posterior lentiform nuclei and probably also up portion of the left  internal capsule. No mass effect or hemorrhage.  2. Small subcortical white matter infarct also suggested in the left  anterior frontal lobe.  3. Underlying chronic small vessel ischemia, including involvement  in the right thalamus.      Electronically Signed    By: Lars Pinks M.D.    On: 09/26/2013 09:54       Verified By: Gwenyth Bender. HALL, M.D.,  CT:    18-Feb-15 04:28, CT Head Without Contrast  CT Head Without Contrast   REASON FOR EXAM:    WEAKNESS  COMMENTS:   May transport without cardiac monitor    PROCEDURE: CT  - CT HEAD WITHOUT CONTRAST  - Sep 26 2013  4:28AM     CLINICAL DATA:  Right-sided weakness.    EXAM:  CT HEAD WITHOUT CONTRAST    TECHNIQUE:  Contiguousaxial images were obtained from the base of the skull  through the vertex without intravenous contrast.    COMPARISON:  None.  FINDINGS:  No acute intracranial abnormality. Specifically, no hemorrhage,  hydrocephalus, mass lesion, acute infarction, or significant  intracranial injury. No acute calvarial abnormality. Visualized  paranasal sinuses and mastoids clear. Orbital soft tissues  unremarkable.     IMPRESSION:  No acute intracranial abnormality.      Electronically Signed    By: Rolm Baptise M.D.    On: 09/26/2013 04:28     Verified By: Raelyn Number, M.D.,   Radiology Impression: Radiology Impression: MRI of brain personally reviewed by me and shows L insular lacunar infarct, mild old white matter changes   Impression/Recommendations: Recommendations:   prior notes reviewed  by me reviewed by me   L insular lacunar infarct-  etiology is  small vessel disease from cocaine, tobacco and uncontrolled HTN;  this is very  symptomatic for pt HTN-  uncontrolled Polysubstance abuse agree with starting ASA 59m daily needs immediate BP reduction with goal < 160/90 as inpatient and outpatient goal < 130/80 continue Lipitor 42mdaily pt advised to stop smoking and refrain from cocaine recommend repeat swallow to r/o aspiration would place in inpatient rehab if accepted will follow if pt is still here otherwise [facility]  Electronic Signatures: SmJamison NeighborMD)  (Signed 19-Feb-15 15:48)  Authored: REFERRING PHYSICIAN, Consult, History of Present Illness, Review of Systems, PAST MEDICAL/SURGICAL HISTORY, HOME MEDICATIONS, ALLERGIES, Social/Family History, NURSING VITAL SIGNS, Physical Exam-, LAB RESULTS, RADIOLOGY RESULTS, Recommendations   Last Updated: 19-Feb-15 15:48 by SmJamison NeighborMD)

## 2014-11-30 NOTE — Discharge Summary (Signed)
PATIENT NAME:  QUATAVIOUS, ROSSA MR#:  [id number] DATE OF BIRTH:  Mar 04, 1956  DATE OF ADMISSION:  11/19/2013 DATE OF DISCHARGE:  11/22/2013  CONSULTANTS:  Speech.   PRIMARY CARE PHYSICIAN:  Dr. Ellene Route.   CHIEF COMPLAINT:  Fall.   DISCHARGE DIAGNOSES: 1.  Fall, likely related to poor mobility from recent stroke.  2.  Pneumonia, likely aspiration with the patient having a silent aspiration per modified barium swallow.  3.  Acute respiratory distress secondary to aspiration pneumonia/pneumonitis.  4.  Hypertension.  5.  Hyperlipidemia.  6.  Bipolar disorder.  7.  Stroke with aphasia and right-sided paresis.  8.  Mild acute renal failure, resolved.  9.  History of anxiety.  10.  Chronic obstructive pulmonary disease.  11.  Depression.   DISCHARGE MEDICATIONS:  Fluoxetine 20 mg once a day, Percocet 5/325 1 tab every eight hours as needed, amlodipine 5 mg 2 tabs once a day, aspirin 325 mg daily, carbamazepine 200 mg 1 tab once a day in the morning and 2 tabs at bedtime, diazepam 10 mg 1/2 tab to 1 tab 3 times a day, isosorbide mononitrate 60 mg once a day extended-release, fluticasone 44 mcg per inhaled aerosol 1 puff 3 times a day as needed for shortness of breath, metoprolol tartrate 100 mg 2 times a day, nitroglycerin 0.3 mg sublingual every five minutes as needed for chest pain.  Ropinirole 0.25 mg once a day at bedtime, hydralazine 50 mg 1/2 tab 3 times a day, atorvastatin 40 mg 1 tab once a day at bedtime, Ambien 10 mg at bedtime, omeprazole 20 mg 3 times a day, lisinopril 20 mg once a day, Augmentin 875/125 mg 1 tab every 12 hours for four days.   He will be going with home health, PT, occupational therapy, nurse aide, nurse and resumption of social work and speech.   DIET:  Low-sodium, low-fat, low-cholesterol mechanical, soft, thin liquids, strict aspiration precautions to include fully upright in order to chin tuck when drinking any liquids, small single sips, prop with pillows  behind lower back to sit fully forward.  No straws.  Meds in puree for safer swallowing.  Cut foods into small pieces, moisten well.   ACTIVITY:  As tolerated.   FOLLOWUP:  Please follow with PCP within 1 to 2 weeks.   DISPOSITION:  Home with home health.   SIGNIFICANT LABORATORY AND IMAGING:  Initial BUN 14, creatinine 1.12, peak creatinine of 1.51.  Last creatinine of 1.3.  Initial white count of 20, hemoglobin is 11.3, platelets are 250.  Last white count is 7.2.  Blood cultures on arrival:  No growth to date.  Urinalysis does not suggest an infection.  CT head without contrast showed no acute intracranial abnormalities.  No acute traumatic injury identified.  Evolution of the left corona radiata lacunar infarct that occurred in February.  Chest x-ray, one view, on April 13th showing right lower lobe pneumonitis.  Portable x-ray chest on April 14 showing persistent infiltrate/pneumonia in the right lower lobe.   HISTORY OF PRESENT ILLNESS AND HOSPITAL COURSE:  For full details of H and P, please see the dictation on April 13 by Dr. Vianne Bulls, but briefly, this is a 59 year old with recent stroke with right-sided weakness, hypertension, anxiety, who came in with a fall.  He had felt dizzy the night before without any fevers or cough without shortness of breath.  He was noted to have a white count and pneumonitis/pneumonia on the right side.  He did not have  a significant fever or hypoxemia.  He was admitted to the hospitalist service, started on broad-spectrum antibiotics given recent hospitalization with Zosyn and vancomycin for broad coverage and blood cultures were obtained.  He also did have some low blood pressure on arrival.  Per him his blood pressure is labile and goes up and down and he does get symptomatic when it is low.  He was started on oxygen.   In regards to the fall, this is likely related to poor mobility from recent stroke.  It is also possible that the pneumonia/pneumonitis and low  blood pressure might have played a role.  On discharge we will hold the hydrochlorothiazide.  For the pneumonia/pneumonitis, he has been treated with IV antibiotics and at this point he will be discharged on Augmentin as dictated above.  He has been taken off of the oxygen.  His white blood cell counts have come down.  We did suspect possible aspiration and did consult speech.  He was seen by speech therapy, underwent a modified barium swallow which did show some silent aspiration.  We suspect that he likely has been having silent aspiration due to his stroke and his diet was adjusted although the patient is not very happy about it.  He was counseled that he is going to be at high risk of developing further aspiration pneumonitis/pneumonia and follow the direction which will be on his discharge instructions.  He did have some acute respiratory distress without significant hypoxemia, but with wheezing and bronchospasm, was started on steroids and has improved.  At this point he feels better.  He was seen by physical therapy and their recommendation is resumption of home services.    PHYSICAL EXAMINATION:  VITAL SIGNS:  On the day of discharge, temperature is 97.4, pulse rate is 59 to 69, manual blood pressure is 146/78 to  170/79, O2 sat 98% on room air.  GENERAL:  The patient is a well-developed male sitting on the edge of the bed.  HEENT:  Normocephalic, atraumatic.  Pupils are equal.  Moist mucous membranes.  NECK:  Supple.  CARDIOVASCULAR:  S1, S2, regular.  No significant murmurs appreciated.  LUNGS:  Good air entry.  No significant wheezing today.  ABDOMEN:  Soft, nontender.  EXTREMITIES:  No pitting edema.  The patient does have right-sided weakness and aphasia neurologically.    His blood pressure is labile, but with a manual cuff which is more accurate, last blood pressure was noted to be 146/78 and he was counseled in regards to being compliant with medications, following with his physician.    Total time spent is 40 minutes.    CODE STATUS:  THE PATIENT IS A FULL CODE.   ____________________________ Vivien Presto, MD sa:ea D: 11/22/2013 15:53:43 ET T: 11/22/2013 23:39:46 ET JOB#: [id number]  cc: Vivien Presto, MD, <Dictator> Raye Sorrow, MD Vivien Presto MD ELECTRONICALLY SIGNED 12/06/2013 14:29

## 2014-11-30 NOTE — Consult Note (Signed)
PATIENT NAME:  David Hall, David Hall MR#:  [id number] DATE OF BIRTH:  07/11/1956  DATE OF CONSULTATION:  09/21/2013  REFERRING PHYSICIAN:   CONSULTING PHYSICIAN:  Allyne Gee, MD  REASON FOR CONSULTATION: Shortness of breath and chest pain.  HISTORY OF PRESENT ILLNESS: A 59 year old gentleman who was admitted with ongoing pain in his chest, said roughly for about a month. He was having also some increasing shortness of breath. He was also admitting to some cough. He denied (Dictation Anomaly) any  hemoptysis. The character of the pain is fairly sharp and he says that these symptoms were worse when he was taking a deep breath. Apparently also he has had a syncopal episode.   PAST SURGICAL HISTORY: Significant for wrist and elbow surgery.  PAST MEDICAL HISTORY: Significant for hyperlipidemia, hypertension, and bipolar disorder.   ALLERGIES: Negative.  MEDICATIONS: Reviewed on the electronic medical record.   SOCIAL HISTORY: Positive for tobacco use. No alcohol or drug use.   FAMILY HISTORY: Significant for coronary artery disease.  REVIEW OF SYSTEMS: A complete 12 point review was performed and was unremarkable. It should be noted that the patient is not exactly a very good historian.   PHYSICAL EXAMINATION:  VITAL SIGNS: At the time he was evaluated, temperature was 97.8, pulse 78, respiratory rate was 19, blood pressure was 158/80, and saturations were 95%.  NECK: Appeared to be supple. There is no JVD. No adenopathy. No thyromegaly.  CHEST: No rales or rhonchi. Expansion was equal.  CARDIOVASCULAR: S1 and S2 was normal. Regular rhythm. No gallop or rub.  ABDOMEN: Soft, nontender.  NEUROLOGIC: He was awake and alert, moving all 4 extremities.  SKIN: Without any acute rashes.  MUSCULOSKELETAL: Did not show any active synovitis.   LABORATORY AND DIAGNOSTICS: Other lab work that was done showed a white count of 11.3, hemoglobin 12.2, and hematocrit 36.4. The chemistries show sodium of  135, potassium 4.1, BUN 17, and creatinine 1.12.   Radiological results: The CT scan of his chest shows very interesting findings in that there are areas of small pulmonary emboli and also appears to have some mass-like lesion in the parenchyma. This is more pronounced on the right side. Consideration for a PET scan was suggested, and I would agree with this.   ASSESSMENT AND PLAN: Due to the fact that he also has pulmonary emboli, he is now on heparin, so we need to definitely do the CT PET scan first and then determine if this is an area of neoplastic origin. We will continue to follow along with you and make further recommendations as necessary. Prognosis is guarded.   ____________________________ Allyne Gee, MD sak:sb D: 09/21/2013 09:44:27 ET T: 09/21/2013 10:15:53 ET JOB#: [id number]  cc: Allyne Gee, MD, <Dictator> Allyne Gee MD ELECTRONICALLY SIGNED 11/01/2013 14:25

## 2014-11-30 NOTE — H&P (Signed)
PATIENT NAME:  David Hall, David Hall MR#:  [id number] DATE OF BIRTH:  October 03, 1955  DATE OF ADMISSION:  11/19/2013  PRIMARY CARE PHYSICIAN: Dr. Ellene Route   EMERGENCY ROOM PHYSICIAN: Dr. Corky Downs.   CHIEF COMPLAINT: Fall.   HISTORY OF PRESENT ILLNESS: The patient is a 59 year old brought in by EMS because of  fall this morning. The patient had fall last night and this morning. According to the patient  the patient felt dizzy last night and also this morning. Denies any fever. No cough. No trouble breathing. No chest pain. The patient was discharged from [facility] rehab four weeks ago. The patient was admitted in February for acute CVA and discharged to [facility] on 02/20.  The patient was doing good and discharged to home and getting home health through the home care agency. The patient had a fall last night and this morning. According to the patient he was in usual state of health but felt dizzy last night and this morning. Denies any other complaints. The patient was put on mechanical soft diet when he left this hospital, but right now he says he is eating a regular diet and denies any cough.   PAST MEDICAL HISTORY: Significant for history of hypertension, anxiety, chronic obstructive pulmonary disease, history of recent acute CVA with residual right-sided deficit, hyperlipidemia, and depression.   ALLERGIES: SEROQUEL.   SOCIAL HISTORY: Previous smoker, now quit. No alcohol. No drugs. Lives with the daughter.   PAST SURGICAL HIST history of sweat gland removal.  FAMILY HISTORY: Significant for coronary artery disease and cerebrovascular accident.   MEDICATIONS:  1. Ambien 10 mg p.o. daily.  2. Amlodipine 5 mg 2 tablets once a day.  3. Aspirin (81 mg p.o. daily.  4. Atorvastatin 40 mg p.o. daily.  5. Tegretol 200 mg in the morning and  400 mg at bedtime.  6. Diazepam 10 mg 1/2 to 1 tablet p.o. t.i.d.  7. Fluoxetine 20 mg p.o. daily.  8. Fluticasone nasal spray 1 puff b.i.d. 9.  Hydralazine 50 mg p.o. half tablet p.o. t.i.d.  10. Metoprolol 100 mg 1 tablet p.o. b.i.d.  11. Imdur 60 mg (po daily. 12. Hydrochlorothiazide/lisinopril 12.5/20 mg p.o. daily.  13. Nitroglycerin as needed for chest pain. 14. Omeprazole 20 mg p.o. daily.  15. Percocet 5/325, 1 tablet every eight hours p.r.n.   REVIEW OF SYSTEMS:  CONSTITUTIONAL: No fever. No fatigue.  EYES: No blurred vision.  ENT: No tinnitus. No ear pain. No epistaxis. No difficulty swallowing.  RESPIRATORY: No cough. No wheezing.  CARDIOVASCULAR: No chest pain. No orthopnea. No PND. No pedal edema.  GASTROINTESTINAL: No nausea. No vomiting. No diarrhea. No abdominal pain. Normal appetite. Denies any other problems.  GENITOURINARY: No dysuria.  ENDOCRINE: No polyuria or nocturia.  HEMATOLOGIC: No anemia.  INTEGUMENTARY: No skin rashes.  MUSCULOSKELETAL: The patient denies any joint pains. Has history of stroke.  NEUROLOGIC: No numbness or weakness. The patient has residual right-sided weakness, mainly in upper extremity but in the lower extremity he says he is able to lift the leg.  PSYCHIATRIC: No anxiety or insomnia. Does have a history of bipolar disorder.   PHYSICAL EXAMINATION: VITAL SIGNS: Temperature 97.9, heart rate 54, blood pressure 90/51, sats 94% on room air. IN GENERAL: Alert, awake, oriented, well-developed, well-nourished male not in distress.  HEAD: Normocephalic and atraumatic.  EYES: Pupils equally reacting to light. Extraocular movements are intact.  NOSE: No nasal lesions. No drainage.  EARS: No drainage or external lesions.  MOUTH: No  lesions.   NECK: Supple. No JVD. No carotid bruit. Normal range of motion.  RESPIRATORY: Good respiratory effort. Clear to auscultation. (no wheeze or ralesCARDIOVASCULAR: S1, S2 regular. No murmurs. The patient has no carotid bruit. Femoral and pedal pulses are normal. (no pedal  edema.  ABDOMEN: Soft, nontender, nondistended. Bowel sounds present. No  organomegaly.  MUSCULOSKELETAL: The patient's extremities moves x 4. No tenderness or effusion.  SKIN: Inspection is more normal.  NEUROLOGIC: The patient is alert, awake, oriented, does have some slurred speech and right-sided deficit. Power is around 1/5 in the right upper extremity and 2/5 in the right lower extremity,  but the power is 5/5 on the left upper and lower extremities. The patient 's sensation is intact both sides.  PSYCHIATRIC: Mood and affect are within normal limits.   LABORATORY AND DIAGNOSTICS: He o acute intracranial abnormality. No acute traumatic injury identified. 2. Expected evolution of the left corona radiata lacunar infarct that occurred in February. Chest x-ray shows right lower lobe increased density, the left lung is lower lung volumes.   WBC 20, hemoglobin 11.8, hematocrit 35.9, platelets 250.   Electrolytes: Sodium is 136, potassium 3.2, chloride 99, bicarbonate 31, BUN 14, creatinine 1.12, glucose 87. Electrolytes troponin less than 0.02, glucose 99.   EKG: Sinus bradycardia, 55 beats per minutes.   ASSESSMENT AND PLAN:  1. The patient is a 59 year old male patient with dizziness and fall likely secondary to hypotension and bradycardia. The patient is going to be admitted to telemetry. Continue to give him fluids for these medications. Hold beta blockers because of bradycardia.  2. Sepsis because of pneumonia, evidence of elevated white count and low blood pressure. Follow blood cultures. The patient is on vancomycin and Zosyn. The patient will have speech therapy to evaluate because of his aspiration pneumonia.  3. History of acute CVA with residual right-sided deficit. The patient is on aspirin and statins. Continue them. Continue physical therapy.  4. Hypertension. Blood pressure is low here. Hold all the blood pressure medications.  5. Hypokalemia. Replace the potassium and hold HCTZ.  6. History of bipolar disorder. The patient is on carbamazepine and  diazepam and fluoxetine. Continue them.  7. Hypotension, multifactorial with lot of blood pressure medications on board. Hold them and see how his blood will be. The patient says that his  bp   at home is around 140 to 150 over 70, but when he came his blood pressure was very low at 90/51.   TIME SPENT: About 55 minutes.   Gastrointestinal prophylaxis and deep vein thrombosis prophylaxis.    ____________________________ Epifanio Lesches, MD sk:sg D: 11/19/2013 16:05:00 ET T: 11/19/2013 16:39:05 ET JOB#: [id number]  cc: Epifanio Lesches, MD, <Dictator> Raye Sorrow, MD  Epifanio Lesches MD ELECTRONICALLY SIGNED 12/24/2013 11:40

## 2014-12-02 DIAGNOSIS — N492 Inflammatory disorders of scrotum: Secondary | ICD-10-CM | POA: Diagnosis not present

## 2014-12-06 ENCOUNTER — Other Ambulatory Visit: Payer: Self-pay | Admitting: Urology

## 2014-12-06 DIAGNOSIS — N492 Inflammatory disorders of scrotum: Secondary | ICD-10-CM

## 2014-12-06 IMAGING — CR DG CHEST 1V PORT
1 series · 1 of 1 positions shown · non-contrast
Comparison: CT ANGIO CHEST dated 09/20/2013; DG CHEST 1V PORT dated
09/20/2013

CLINICAL DATA: Weakness

EXAM:
PORTABLE CHEST - 1 VIEW

[x chest ap]
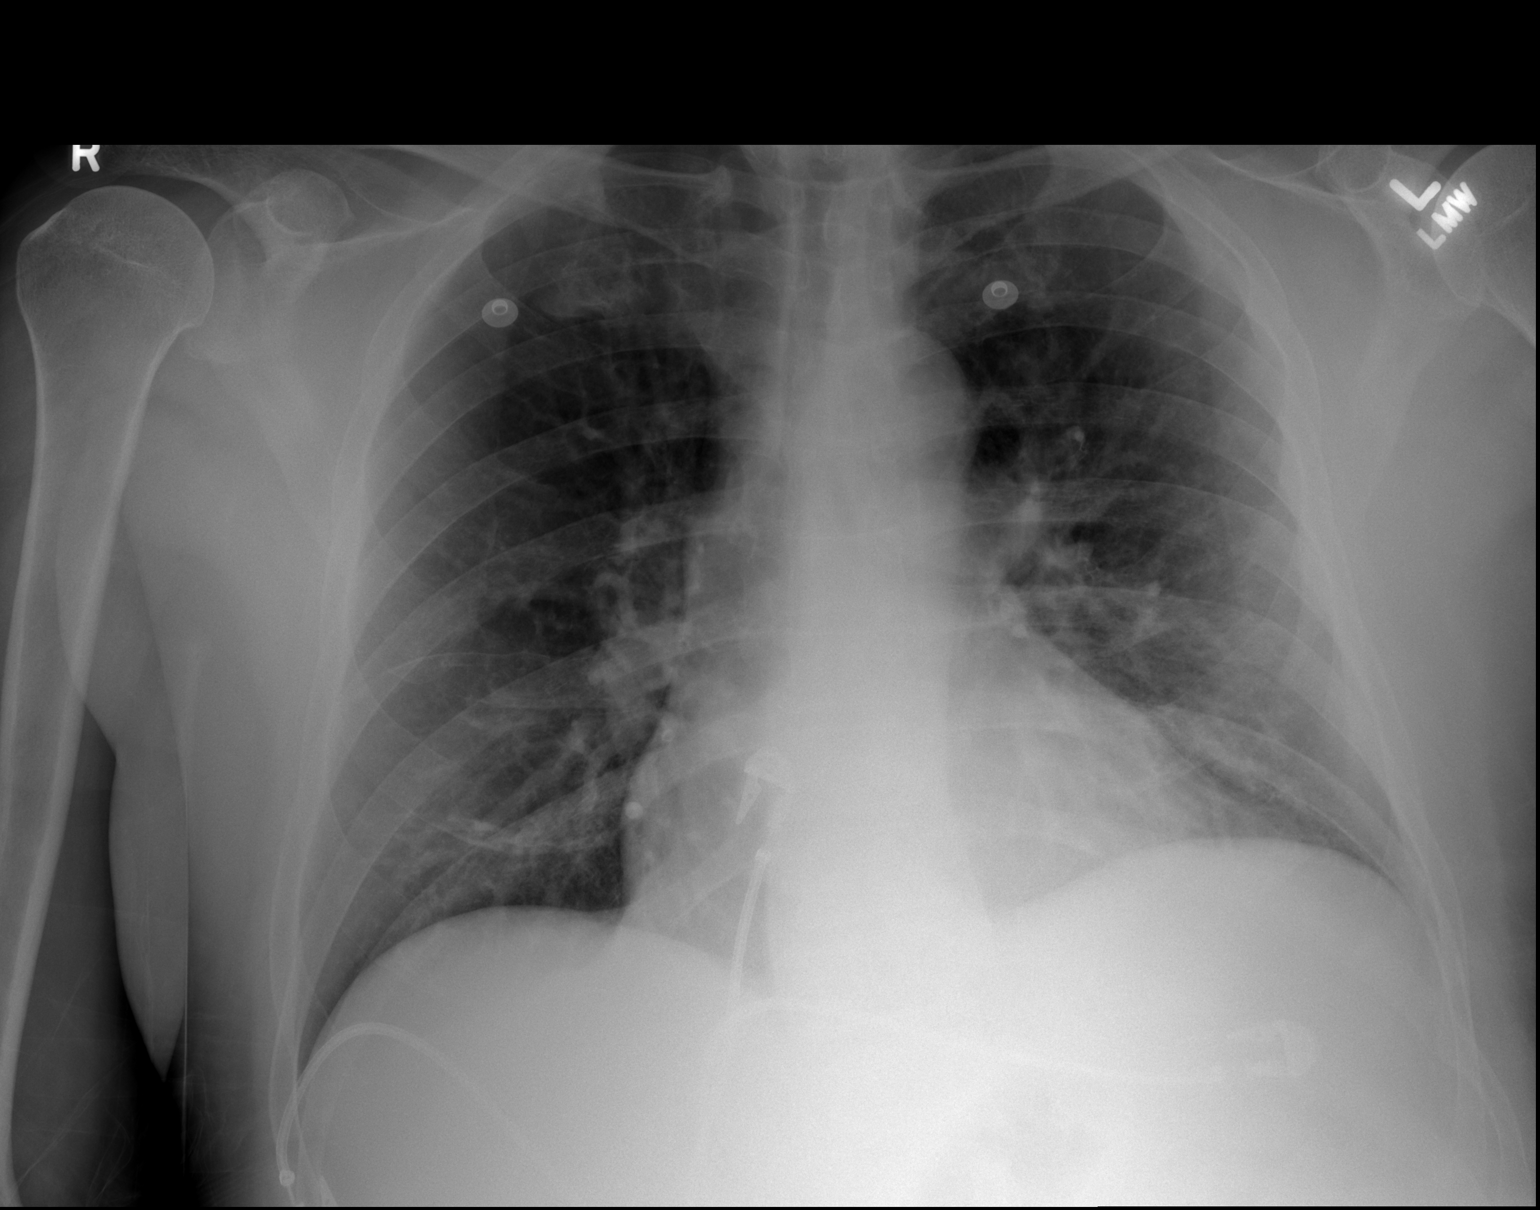

[1 of 1 positions shown; findings below may reference images not displayed]

FINDINGS: The cardiac silhouette is upper normal limits of normal in size,
mediastinal silhouette is unremarkable for this low inspiratory
portable examination with crowded vasculature markings. The lungs
are clear without pleural effusions or focal consolidations. Trachea
projects midline and there is no pneumothorax. Included soft tissue
planes and osseous structures are non-suspicious. Multiple EKG lines
overlie the patient and may obscure subtle underlying pathology.
Mild lumbar levoscoliosis may be positional.
IMPRESSION: Borderline cardiomegaly, no acute pulmonary process for this low
inspiratory portable examination.

  By: Rtoyota Joshjax

## 2014-12-08 NOTE — H&P (Signed)
PATIENT NAME:  David Hall, GREIS MR#:  [id number] DATE OF BIRTH:  [birth year]  DATE OF ADMISSION:  09/02/2014  IDENTIFYING INFORMATION: The patient is a 59 year old divorced Caucasian male from [place], [place] who is currently disabled.   CHIEF COMPLAINT: " I was hearing voices that were telling me to hurt myself. "  HISTORY OF PRESENT ILLNESS: Per chart review, the patient presented to our Emergency Department via EMS. On January 24th, he had complained of falling twice and stated that he was suicidal. The patient reported thinking of walking in front of a car. The patient was evaluated by the Emergency Room psychiatrist who wrote in the note that the patient had been  suicidal for the last 2 days and during that assessment, the patient denied having any current stressors. The patient remain in the Emergency Department from the 24th. He was admitted to the psychiatric unit on the evening of the 25th, as a bed had opened up. Today, the patient was interviewed. He reported that nothing had changed prior to his arrival to the Emergency Department. It was unclear to him as to what had precipitated him to have suicidal thoughts that Saturday. He states that usually, towards the end of the month, he feels more depressed, as he is running out of money and does not have money to leave the house. He was talking in the house with his dog alone, and he thinks that the snow storm that occurred over that weekend made him feel worse. The patient said that he was having voices that were telling him to kill himself. He states that he has been having these voices on and off for years that usually just come and go. The patient, himself, decided to call 911 and that is how he ended up in our Emergency Department. He does report having depressed mood at that point in time; however, today, he feels much better. He is ready to be discharged. He denies having any suicidality, homicidality, or auditory or visual  hallucinations. He denies major issues with, energy, appetite, or concentration. The patient is currently denying having any physical complaints as well. In terms of substance abuse, the patient states that in the past, he used to drink heavily for years, and he used cocaine as well once in a while, but on the weekends; however, he has not used alcohol or cocaine in several years. The patient was quite vague as to how many years  he used and the quantities; however, he was adamant that he has not used either of these substances in several years. The patient smokes cigarettes, about 4 a day.   PAST PSYCHIATRIC HISTORY: The patient currently reports following up with [facility] psychiatry. He does not know his diagnosis and he does not know what medications he was prescribed with. He said that he started hearing voices about 6 years ago, even before he suffered a stroke. The patient said that he has been hospitalized only 1 time before here, in our unit, 4 years ago. He could not tell me the reasons why he was hospitalized at that time. The patient denies any history of suicidal attempts or self-injurious behaviors.   PAST MEDICAL HISTORY: Positive for having 1 stroke a year ago, and he was hospitalized in our facility and was sent to [facility] for rehab for about two 2. As a result of the stroke, he suffers right-sided arm and leg weakness. He suffers from hypertension, hypercholesterolemia. The patient said that at some point in time,  he was diagnosed with lung cancer; however, never a biopsy or treatment was specifically given, as he said that "when radiology studies follow up, the lesion that was noted initially had disappeared." The patient is currently receiving home health services. He has a home health nurse that goes to his house 7 days a week. She dispenses medications for him. He said that he is being compliant with all of his medications; however, he is running out of 6 of them, and he does not have the  money to refill them until next month.   This is old in medical history prior with the patient, but per his medication list, it looks like he suffers from COPD, from possibly seizures, as he is on Tegretol. Restless leg syndrome, and GERD.   FAMILY HISTORY: Noncontributory. The patient denies having any family history of mental illness, substance abuse, or suicidal attempts.   SOCIAL HISTORY: The patient is currently living in a house by himself with his dog in [place], [place]. He is divorced. He was married with his wife for 29 years. He had 3 kids in his marriage, but they are grown. He has no contact with them. He has no family or any support in the community. The only support is the home health nurse that goes to his house 7 days a week. The patient said that prior to his disability, which was approved about 4 years ago, he was working as a Dealer. In terms of his education, he repeated the 2nd grade  and dropped out of school in the 9th grade. He said that he has troubles with spelling. He denies any history of current or past legal charges.   ALLERGIES: No known drug allergies.   REVIEW OF SYSTEMS: The patient denies any nausea, vomiting, or diarrhea. The rest of the review of systems is negative.   MENTAL STATUS EXAMINATION:  GENERAL: The patient is a 59 year old Caucasian male with right-sided weakness as a result of the stroke. He walks with a limp. Behavior: He was cooperative and calm during the assessment. Eye contact was within normal limits. His speech had regular tone, volume, and rate. Thought process is linear. Thought content is negative for suicidality, homicidality, or auditory or visual hallucinations. His mood appeared euthymic. His affect was reactive. Insight and judgment are limited. Cognitive examination, he is alert. He is oriented in person, place, time, and situation. Visual and spacial ability: The patient was able to draw a clock and said all the numbers in  the right quadrants, and said that the time, 10 minutes before 2:00. Memory: The patient appears to have issues with recent and remote memory, as he had difficulties remembering his past history. In details of it, he was quite vague in answering questions. Attention and concentration appear to be grossly intact; however, it was not formerly tested.   PHYSICAL EXAMINATION: VITAL SIGNS: The patient's blood pressure today is 158/86, respirations 20, pulse 89, temperature 98.5.  GENERAL: The patient is a 59 year old white male in no acute distress, who appears his stated age.  MUSCULOSKELETAL: The patient has a right-sided weakness. No evidence of involuntary movements. Walks with a limp as a result of the weakness .  LABORATORY RESULTS: The patient had a comprehensive metabolic panel showing sodium of 126, potassium 3.2, AST 27, ALT of 23, TSH 0.84. Urine toxicology was negative for all substances. Hemoglobin is 12.7, hematocrit 37.6. WBC is 9.1. Salicylate is 2.9. Acetaminophen level less than 2. EKG: Normal sinus rhythm, no changes.  DIAGNOSES: AXIS I: Major depressive disorder, alcohol use disorder, severe in sustained remission. Cocaine use disorder, moderate in full sustained remission. Tobacco use disorder, moderate. Moderate neurocognitive impairment secondary to cerebrovascular disease (history of stroke in 2014), hypertension, hypercholesterolemia, gastroesophageal reflux disease, chronic obstructive pulmonary disease, psychosocial stressors, financial stressors, isolation.   PLAN: This patient was admitted from the Emergency Department. I was reviewing his laboratory results and i found that his sodium was 126 on January 24th. This was not rechecked or followed up on. His potassium also was 3.2. I am checking a basic metabolic panel stat at this point in time. He does not appear to be disoriented or in any acute distress at this point in time, so I am  hoping that these results are just an error.  If sodium continues to be decreased, I will consult internal medicine for the management of hyponatremia. It also appears that his medication reconciliation was not done as a result of the snow storm and I imagine that his pharmacy was not contacted. I have contacted our pharmacy and requested for them to complete a medication reconciliation in order to verify, accurately, his outpatient regimen.  2. For major depressive disorder, it appears that the patient, per the medication reconciliation, was on fluoxetine; however, once again, this needs to be confirmed with the patient's pharmacy, so this information will be obtained in the next 2 hours. However, if not on an antidepressant, he will be started on an SSI, most likely.  3. Psychosis. The patient was reporting having psychotic symptoms prior to admission; however, he has not received any antipsychotics over the weekend, and today, he denies having any auditory or visual hallucinations. He states that these voices come and go. He does not appear to meet criteria for schizophrenia on presentation and per history, and he does not appear to be manic or hypomanic at this point in time, so it is unlikely that these hallucinations are part of any specific psychiatric disorder. They could be secondary to uncontrolled hypertension and hypertensive crisis, as it is noted that the patient has not been taking some of his medications prior to arrival to the Emergency Room for at least 6 days, and his blood pressure was elevated at the time of arrival. This also could have been part of, somehow,  some mild delirium due to the hyponatremia.  4. For hypertension, he will be continued on lisinopril, Imdur, and Norvasc.  5. For gastroesophageal reflux disease, he will be continued on pantoprazole.  6. For chronic obstructive pulmonary disease, he will be continued on a Spiriva inhaler.  7. For possible seizures versus mood or any irritability, he will be continued on  Tegretol 600 mg a day. Tegretol level has not been checked, so we will order that as well.   DISCHARGE DISPOSITION: Once stable, this patient will be discharged back to his home, and he will continue to follow up with his home health nurse, and he will be set up to follow up with his outpatient psychiatrist at [facility] in.     Contacted pt's pharmacy Review medications list  Contacted lab and discussed lab results as Na was 126 on 1/24 Made med adjustment and d/c tegretol as it can cause hyponatremia Contacted pt's Psychiatrist and discussed treatment and medications >90 minutes  >50 % of the time was spent in coordination of care ____________________________ Hildred Priest, MD ahg:mw D: 09/03/2014 14:38:00 ET T: 09/03/2014 15:11:42 ET JOB#: [id number]  cc: Hildred Priest, MD, <Dictator> Merlyn Albert Louann Liv  MD ELECTRONICALLY SIGNED 09/03/2014 18:30

## 2014-12-08 NOTE — Consult Note (Signed)
PATIENT NAME:  David Hall, David Hall MR#:  [id number] DATE OF BIRTH:  18-Sep-1955  DATE OF CONSULTATION:  09/01/2014  CONSULTING PHYSICIAN:  Kitty Cadavid K. Franchot Mimes, MD  PLACE OF DICTATION:  [facility] Emergency Room, [place], [place], [place].   SUBJECTIVE:  The patient was seen in consultation in [facility] Emergency Room, [place], [place], [place].  The patient is a 59 year old white male who is not employed and last worked 6 years ago.  Divorced for 5 years. The patient lives by himself in a house. The patient is status post CVA and he has home healthcare nurse that comes and helps him 3 hours a day for about 7 days a week.  Last night he started feeling suicidal and called for help for ambulance to bring him here.    The patient reports he started having suicidal thought 2 days ago with no specific stress and nothing new, but he just had suicidal wishes and thoughts, though he denied any active plans and came here for help.  PAST PSYCHIATRIC HISTORY:   Hstory of inpatient hospital psychiatry at [facility] about 2 years ago for similar episode of depression.  He was inpatient for 72 hours and then stabilized and discharged. The patient reports that he is being followed by [facility] on outpatient basis for his psychiatric illness.    MENTAL STATUS:   Patient is alert and oriented, calm, pleasant, and cooperative.   No agitation.   Affect is flat, mood depressed. Admits feeling hopeless, helpless.  Admit feeling pointless and useless. Does have suicidal wishes and thoughts, but currently he contracts for safety and wants to get help. Admits to hearing voices and of some man telling him he is no good and he should kill himself.  Admits that he sees things and he sees bugs sometimes.  Insight and judgment guarded. Impulse control poor.   ALCOHOL AND DRUGS:  Denies drinking alcohol.  Denies street or prescription drug abuse. Does admit smoking nicotine cigarettes about a few per day.  IMPRESSION:  Major depressive disorder, recurrent with psychosis and suicidal thoughts, but contracts for safety.  RECOMMENDATION: Inpatient hospitalization in psychiatry for close observation and evaluation as needed and treatment with the same.   ____________________________ Wallace Cullens. Franchot Mimes, MD skc:LT D: 09/01/2014 12:58:40 ET T: 09/01/2014 19:37:46 ET JOB#: [id number]  cc: Arlyn Leak K. Franchot Mimes, MD, <Dictator> Dewain Penning MD ELECTRONICALLY SIGNED 09/07/2014 11:59

## 2014-12-10 ENCOUNTER — Ambulatory Visit: Payer: Medicare Other

## 2014-12-10 DIAGNOSIS — N492 Inflammatory disorders of scrotum: Secondary | ICD-10-CM | POA: Diagnosis not present

## 2014-12-11 DIAGNOSIS — I2510 Atherosclerotic heart disease of native coronary artery without angina pectoris: Secondary | ICD-10-CM | POA: Diagnosis not present

## 2014-12-11 LAB — LIPID PANEL
Cholesterol: 174 mg/dL (ref 0–200)
HDL: 41 mg/dL (ref 35–70)
LDL Cholesterol: 112 mg/dL
Triglycerides: 105 mg/dL (ref 40–160)

## 2014-12-18 IMAGING — CR DG CHEST 2V
1 series · 1 of 1 positions shown · non-contrast
Comparison: 09/26/2013

CLINICAL DATA: Dyspnea

EXAM:
CHEST  2 VIEW

[w chest lat]
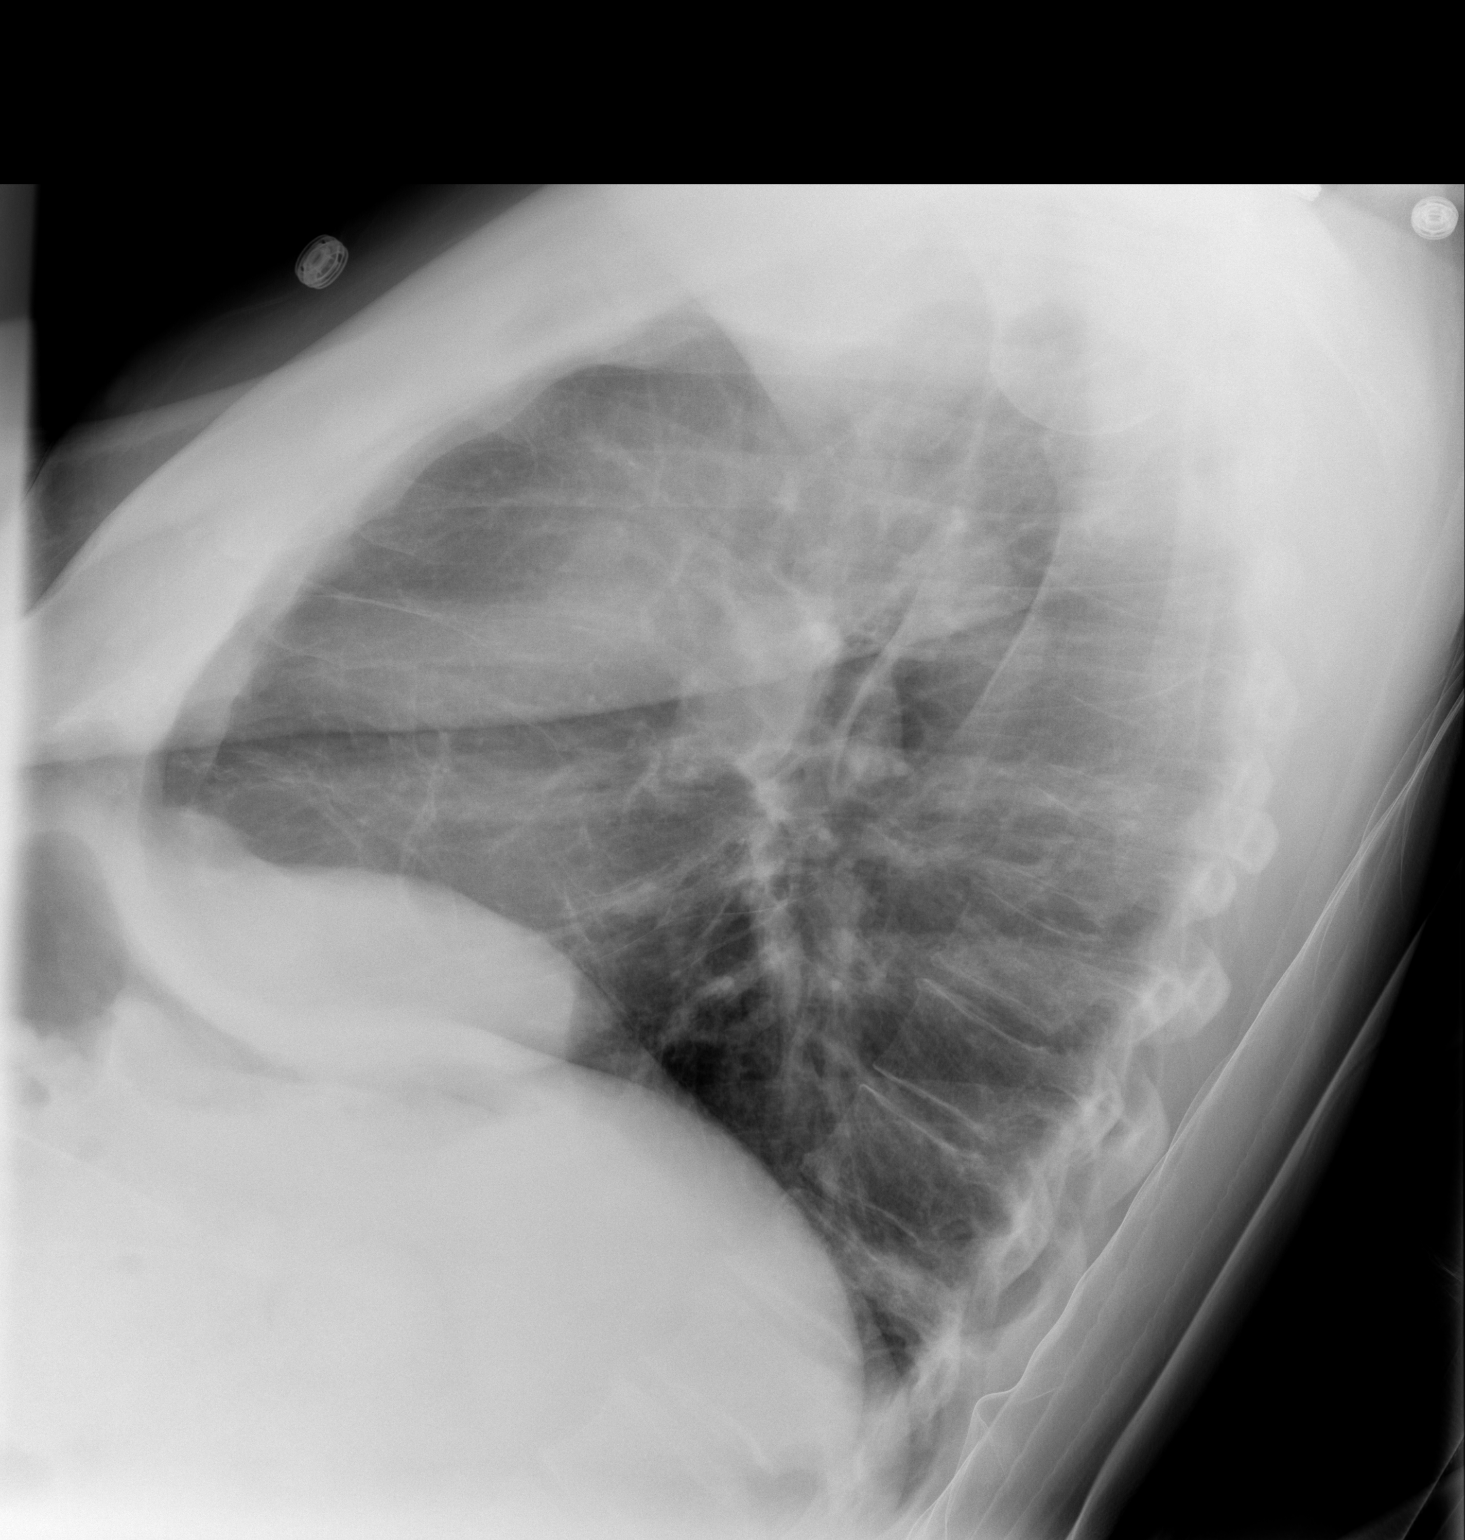

[1 of 1 positions shown; findings below may reference images not displayed]

FINDINGS: An ill-defined nodule persists at the the left base, indistinct
appearance making size estimation difficult. The nodule correlates
with abnormality seen on previous chest CT. No cardiomegaly.
Unchanged upper mediastinal contours. No edema, consolidation,
effusion, or pneumothorax.
IMPRESSION: 1. No edema or consolidation.
2. Persistent nodule at the left base, reference chest CT
09/20/2013.

## 2014-12-24 DIAGNOSIS — N492 Inflammatory disorders of scrotum: Secondary | ICD-10-CM | POA: Diagnosis not present

## 2014-12-24 DIAGNOSIS — I10 Essential (primary) hypertension: Secondary | ICD-10-CM | POA: Diagnosis not present

## 2014-12-24 DIAGNOSIS — E785 Hyperlipidemia, unspecified: Secondary | ICD-10-CM | POA: Diagnosis not present

## 2014-12-24 DIAGNOSIS — Z7409 Other reduced mobility: Secondary | ICD-10-CM | POA: Diagnosis not present

## 2014-12-24 DIAGNOSIS — J441 Chronic obstructive pulmonary disease with (acute) exacerbation: Secondary | ICD-10-CM | POA: Diagnosis not present

## 2014-12-27 DIAGNOSIS — R300 Dysuria: Secondary | ICD-10-CM | POA: Diagnosis not present

## 2015-01-03 DIAGNOSIS — Z9981 Dependence on supplemental oxygen: Secondary | ICD-10-CM | POA: Diagnosis not present

## 2015-01-16 ENCOUNTER — Other Ambulatory Visit: Payer: Self-pay

## 2015-01-16 ENCOUNTER — Ambulatory Visit: Payer: Medicare Other | Admitting: Psychiatry

## 2015-01-22 DIAGNOSIS — F323 Major depressive disorder, single episode, severe with psychotic features: Secondary | ICD-10-CM | POA: Diagnosis not present

## 2015-01-24 ENCOUNTER — Telehealth: Payer: Self-pay | Admitting: Family Medicine

## 2015-01-24 NOTE — Telephone Encounter (Signed)
Called pt to discuss bubble packs of medication from [facility] that his home health RN recommended. Pt lost access to home health when he lost his medicaid as of June 1. He was previously having RN from home health fill his pill box and manage his medications. Asked pt if he would like me to send prescriptions to [facility] and order the bubble pack. Pt declined. He is trying to find someone to help him manage medications and would like to keep using [facility].

## 2015-01-27 ENCOUNTER — Encounter: Payer: Self-pay | Admitting: Family Medicine

## 2015-01-27 ENCOUNTER — Ambulatory Visit (INDEPENDENT_AMBULATORY_CARE_PROVIDER_SITE_OTHER): Payer: Medicare Other | Admitting: Family Medicine

## 2015-01-27 VITALS — BP 118/67 | HR 63 | Temp 97.6°F | Resp 16 | Ht 65.0 in | Wt 166.0 lb

## 2015-01-27 DIAGNOSIS — R918 Other nonspecific abnormal finding of lung field: Secondary | ICD-10-CM | POA: Diagnosis not present

## 2015-01-27 DIAGNOSIS — M179 Osteoarthritis of knee, unspecified: Secondary | ICD-10-CM | POA: Insufficient documentation

## 2015-01-27 DIAGNOSIS — F319 Bipolar disorder, unspecified: Secondary | ICD-10-CM | POA: Insufficient documentation

## 2015-01-27 DIAGNOSIS — N182 Chronic kidney disease, stage 2 (mild): Secondary | ICD-10-CM

## 2015-01-27 DIAGNOSIS — J449 Chronic obstructive pulmonary disease, unspecified: Secondary | ICD-10-CM | POA: Insufficient documentation

## 2015-01-27 DIAGNOSIS — F17200 Nicotine dependence, unspecified, uncomplicated: Secondary | ICD-10-CM | POA: Insufficient documentation

## 2015-01-27 DIAGNOSIS — I129 Hypertensive chronic kidney disease with stage 1 through stage 4 chronic kidney disease, or unspecified chronic kidney disease: Secondary | ICD-10-CM | POA: Insufficient documentation

## 2015-01-27 DIAGNOSIS — J432 Centrilobular emphysema: Secondary | ICD-10-CM

## 2015-01-27 DIAGNOSIS — R911 Solitary pulmonary nodule: Secondary | ICD-10-CM | POA: Insufficient documentation

## 2015-01-27 DIAGNOSIS — I10 Essential (primary) hypertension: Secondary | ICD-10-CM | POA: Diagnosis not present

## 2015-01-27 DIAGNOSIS — M1710 Unilateral primary osteoarthritis, unspecified knee: Secondary | ICD-10-CM | POA: Insufficient documentation

## 2015-01-27 DIAGNOSIS — G2581 Restless legs syndrome: Secondary | ICD-10-CM | POA: Insufficient documentation

## 2015-01-27 DIAGNOSIS — F1410 Cocaine abuse, uncomplicated: Secondary | ICD-10-CM | POA: Insufficient documentation

## 2015-01-27 DIAGNOSIS — N492 Inflammatory disorders of scrotum: Secondary | ICD-10-CM | POA: Diagnosis not present

## 2015-01-27 DIAGNOSIS — F323 Major depressive disorder, single episode, severe with psychotic features: Secondary | ICD-10-CM | POA: Diagnosis not present

## 2015-01-27 DIAGNOSIS — R739 Hyperglycemia, unspecified: Secondary | ICD-10-CM | POA: Insufficient documentation

## 2015-01-27 NOTE — Assessment & Plan Note (Signed)
Order spirometry to determine COPD control. Pt has home oxygen for exertion. Continue inhalers.

## 2015-01-27 NOTE — Assessment & Plan Note (Signed)
Continue current regimen. Blood pressure is well controlled today. Continue to check at home daily. RTC 3 mos.

## 2015-01-27 NOTE — Assessment & Plan Note (Signed)
Scheduled CT with [facility] but pt did not show. We re-refer.

## 2015-01-27 NOTE — Patient Instructions (Signed)
Continue to check BP.   Scrotal abscess: Keep warm compresses as needed. Apply bacitracin. Continue hippaclens. If you develop fever, redness, increasing pain and swelling- go to the ER.

## 2015-01-27 NOTE — Assessment & Plan Note (Signed)
Pt reports site keeps draining but declined exam by provider today. Has been followed by [facility] x 2 and they have not found anything. Pt would like to see another urologist.

## 2015-01-27 NOTE — Progress Notes (Signed)
Subjective:    Patient ID: David Hall., male    DOB: Mar 29, 1956, 59 y.o.   MRN: [id number]  HPI: David Hall. is a 59 y.o. male presenting on 01/27/2015 for Hypertension   Hypertension This is a chronic problem. The current episode started more than 1 year ago. The problem has been waxing and waning since onset. Pertinent negatives include no chest pain, headaches, palpitations, peripheral edema or shortness of breath. Past treatments include calcium channel blockers, beta blockers and ACE inhibitors. The current treatment provides moderate improvement.   Scrotal abcess: Was following with urology. Was told it was doing well. It frequently opens and drains. Pt is concerned it might need to be I&D again. No fevers, chills. Would like to see a new urologist.   Past Medical History  Diagnosis Date  . Hypertension   . GERD (gastroesophageal reflux disease)   . Bipolar disorder   . COPD (chronic obstructive pulmonary disease)   . Shortness of breath   . Insomnia, controlled   . Stroke   . Oxygen deficiency   . Hyperlipidemia     Current Outpatient Prescriptions on File Prior to Visit  Medication Sig  . ADVAIR DISKUS 250-50 MCG/DOSE AEPB INHALE 1 PUFF PO BID  . amLODipine (NORVASC) 10 MG tablet TK 1 T PO QD  . aspirin 325 MG EC tablet Take 1 tablet (325 mg total) by mouth daily.  [name] atorvastatin (LIPITOR) 40 MG tablet Take 1 tablet (40 mg total) by mouth daily.  [name] gabapentin (NEURONTIN) 300 MG capsule TK 2 CS PO NIGHTLY  . HIBICLENS 4 % external liquid U ON AFFECTED AREA BID  . hydrALAZINE (APRESOLINE) 25 MG tablet TK 1 T PO TID  . hydrochlorothiazide (HYDRODIURIL) 25 MG tablet   . isosorbide mononitrate (IMDUR) 30 MG 24 hr tablet TK 1 T PO D.  . lisinopril (PRINIVIL,ZESTRIL) 30 MG tablet   . metoprolol (LOPRESSOR) 50 MG tablet   . nitroGLYCERIN (NITROSTAT) 0.3 MG SL tablet Place 1 tablet (0.3 mg total) under the tongue every 5 (five) minutes as needed for chest  pain.  [name] omeprazole (PRILOSEC) 20 MG capsule Take 1 capsule (20 mg total) by mouth daily.   No current facility-administered medications on file prior to visit.    Review of Systems  Constitutional: Negative for fever and chills.  Respiratory: Negative for chest tightness, shortness of breath and wheezing.   Cardiovascular: Negative for chest pain and palpitations.  Gastrointestinal: Negative.   Endocrine: Negative for cold intolerance, heat intolerance, polydipsia, polyphagia and polyuria.  Genitourinary: Negative for scrotal swelling (drainging lesion from inner groin. Has had recurrent scrotal abcess.).  Neurological: Negative for light-headedness, numbness and headaches.  Psychiatric/Behavioral: Negative.    Per HPI unless specifically indicated above     Objective:    BP 118/67 mmHg  Pulse 63  Temp(Src) 97.6 F (36.4 C) (Oral)  Resp 16  Ht 5' 5" (1.651 m)  Wt 166 lb (75.297 kg)  BMI 27.62 kg/m2  Wt Readings from Last 3 Encounters:  01/27/15 166 lb (75.297 kg)  01/03/15 163 lb 6.1 oz (74.109 kg)  10/10/13 199 lb 1.2 oz (90.3 kg)    Physical Exam  Constitutional: He is oriented to person, place, and time. He appears well-developed and well-nourished. No distress.  Neck: Normal range of motion. Neck supple. No thyromegaly present.  Cardiovascular: Normal rate and regular rhythm.  Exam reveals no gallop and no friction rub.   No murmur heard. Pulmonary/Chest: Effort  normal and breath sounds normal.  Abdominal: Soft. Bowel sounds are normal. There is no tenderness. There is no rebound.  Genitourinary:  Pt refused exam.   Musculoskeletal: Normal range of motion. He exhibits no edema or tenderness.  Lymphadenopathy:    He has no cervical adenopathy.  Neurological: He is alert and oriented to person, place, and time.  Skin: Skin is warm and dry. He is not diaphoretic.   Results for orders placed or performed in visit on 09/02/14  Acetaminophen level  Result Value Ref  Range   Acetaminophen < 2 mcg/mL  Comprehensive metabolic panel  Result Value Ref Range   Glucose 80 65-99 mg/dL   BUN 8 7-18 mg/dL   Creatinine 0.87 0.60-1.30 mg/dL   Sodium 126 (L) 136-145 mmol/L   Potassium 3.2 (L) 3.5-5.1 mmol/L   Chloride 90 (L) 98-107 mmol/L   Co2 27 21-32 mmol/L   Calcium, Total 8.6 8.5-10.1 mg/dL   SGOT(AST) 27 15-37 Unit/L   SGPT (ALT) 23 U/L   Alkaline Phosphatase 133 (H) Unit/L   Albumin 3.9 3.4-5.0 g/dL   Total Protein 7.5 6.4-8.2 g/dL   Bilirubin,Total 0.5 0.2-1.0 mg/dL   Osmolality 251 275-301   Anion Gap 9 7-16   EGFR (African American) >60 >60mL/min   EGFR (Non-African Amer.) >60 >60mL/min  Ethanol  Result Value Ref Range   Ethanol < 3 mg/dL  CBC  Result Value Ref Range   WBC 9.1 3.8-10.6 x10 3/mm 3   RBC 4.43 4.40-5.90 x10 6/mm 3   HGB 12.7 (L) 13.0-18.0 g/dL   HCT 37.6 (L) 40.0-52.0 %   MCV 85 80-100 fL   MCH 28.8 26.0-34.0 pg   MCHC 33.9 32.0-36.0 g/dL   RDW 14.6 (H) 11.5-14.5 %   Platelet 241 150-440 x10 3/mm 3  Salicylate level  Result Value Ref Range   Salicylates, Serum 2.9 (H) mg/dL  TSH  Result Value Ref Range   Thyroid Stimulating Horm 0.844 uIU/mL  Drug Screen, Urine  Result Value Ref Range   Tricyclic, Ur Screen NEGATIVE Cutoff-1000 ng/mL   Amphetamines, Ur Screen NEGATIVE Cutoff-1000 ng/mL   MDMA (Ecstasy)Ur Screen NEGATIVE Cutoff-500 ng/mL   Cocaine Metabolite,Ur [place] NEGATIVE Cutoff-300 ng/mL   Opiate, Ur Screen NEGATIVE Cutoff-300 ng/mL   Phencyclidine (PCP) Ur S NEGATIVE Cutoff-25 ng/mL   Cannabinoid 50 Ng, Ur [place] NEGATIVE Cutoff-50 ng/mL   Barbiturates, Ur Screen NEGATIVE Cutoff-200 ng/mL   Methadone, Ur Screen NEGATIVE Cutoff-300 ng/mL   Benzodiazepine, Ur Scrn NEGATIVE Cutoff-200 ng/mL  Basic metabolic panel  Result Value Ref Range   Glucose 84 65-99 mg/dL   BUN 16 7-18 mg/dL   Creatinine 1.47 (H) 0.60-1.30 mg/dL   Sodium 134 (L) 136-145 mmol/L   Potassium 3.9 3.5-5.1 mmol/L   Chloride 102 98-107 mmol/L    Co2 26 21-32 mmol/L   Calcium, Total 8.6 8.5-10.1 mg/dL   Osmolality 269 275-301   Anion Gap 6 (L) 7-16   EGFR (African American) >60 >60mL/min   EGFR (Non-African Amer.) 52 (L) >60mL/min  Basic metabolic panel  Result Value Ref Range   Glucose 83 65-99 mg/dL   BUN 21 (H) 7-18 mg/dL   Creatinine 1.26 0.60-1.30 mg/dL   Sodium 136 136-145 mmol/L   Potassium 3.5 3.5-5.1 mmol/L   Chloride 103 98-107 mmol/L   Co2 24 21-32 mmol/L   Calcium, Total 8.7 8.5-10.1 mg/dL   Osmolality 274 275-301   Anion Gap 9 7-16   EGFR (African American) >60 >60mL/min   EGFR (Non-African Amer.) >60 >60mL/min        Assessment & Plan:   Problem List Items Addressed This Visit      Cardiovascular and Mediastinum   HTN (hypertension)    Continue current regimen. Blood pressure is well controlled today. Continue to check at home daily. RTC 3 mos.       Essential (primary) hypertension     Respiratory   COPD (chronic obstructive pulmonary disease) with emphysema - Primary    Order spirometry to determine COPD control. Pt has home oxygen for exertion. Continue inhalers.      Relevant Orders   Spirometry with graph     Genitourinary   Abscess of scrotum    Pt reports site keeps draining but declined exam by provider today. Has been followed by [facility] x 2 and they have not found anything. Pt would like to see another urologist.       Relevant Orders   Ambulatory referral to Urology     Other   Lung nodule, multiple    Scheduled CT with [facility] but pt did not show. We re-refer.          No orders of the defined types were placed in this encounter.      Follow up plan: Return in about 3 months (around 04/29/2015) for HTN follow-up. [name]

## 2015-01-28 ENCOUNTER — Ambulatory Visit: Payer: Medicare Other | Admitting: Family Medicine

## 2015-02-05 ENCOUNTER — Telehealth: Payer: Self-pay | Admitting: Family Medicine

## 2015-02-05 NOTE — Telephone Encounter (Signed)
Spoke to [employer] who states when she called the pt to verify appointment he stated he did not want to travel that far for the appointment. No appointment has been scheduled.

## 2015-02-21 DIAGNOSIS — F411 Generalized anxiety disorder: Secondary | ICD-10-CM | POA: Diagnosis not present

## 2015-02-21 DIAGNOSIS — F323 Major depressive disorder, single episode, severe with psychotic features: Secondary | ICD-10-CM | POA: Diagnosis not present

## 2015-03-01 IMAGING — CR DG CHEST 1V PORT
1 series · 1 of 1 positions shown · non-contrast
Comparison: 11/20/2013

CLINICAL DATA: Shortness of breath

EXAM:
PORTABLE CHEST - 1 VIEW

[ap]
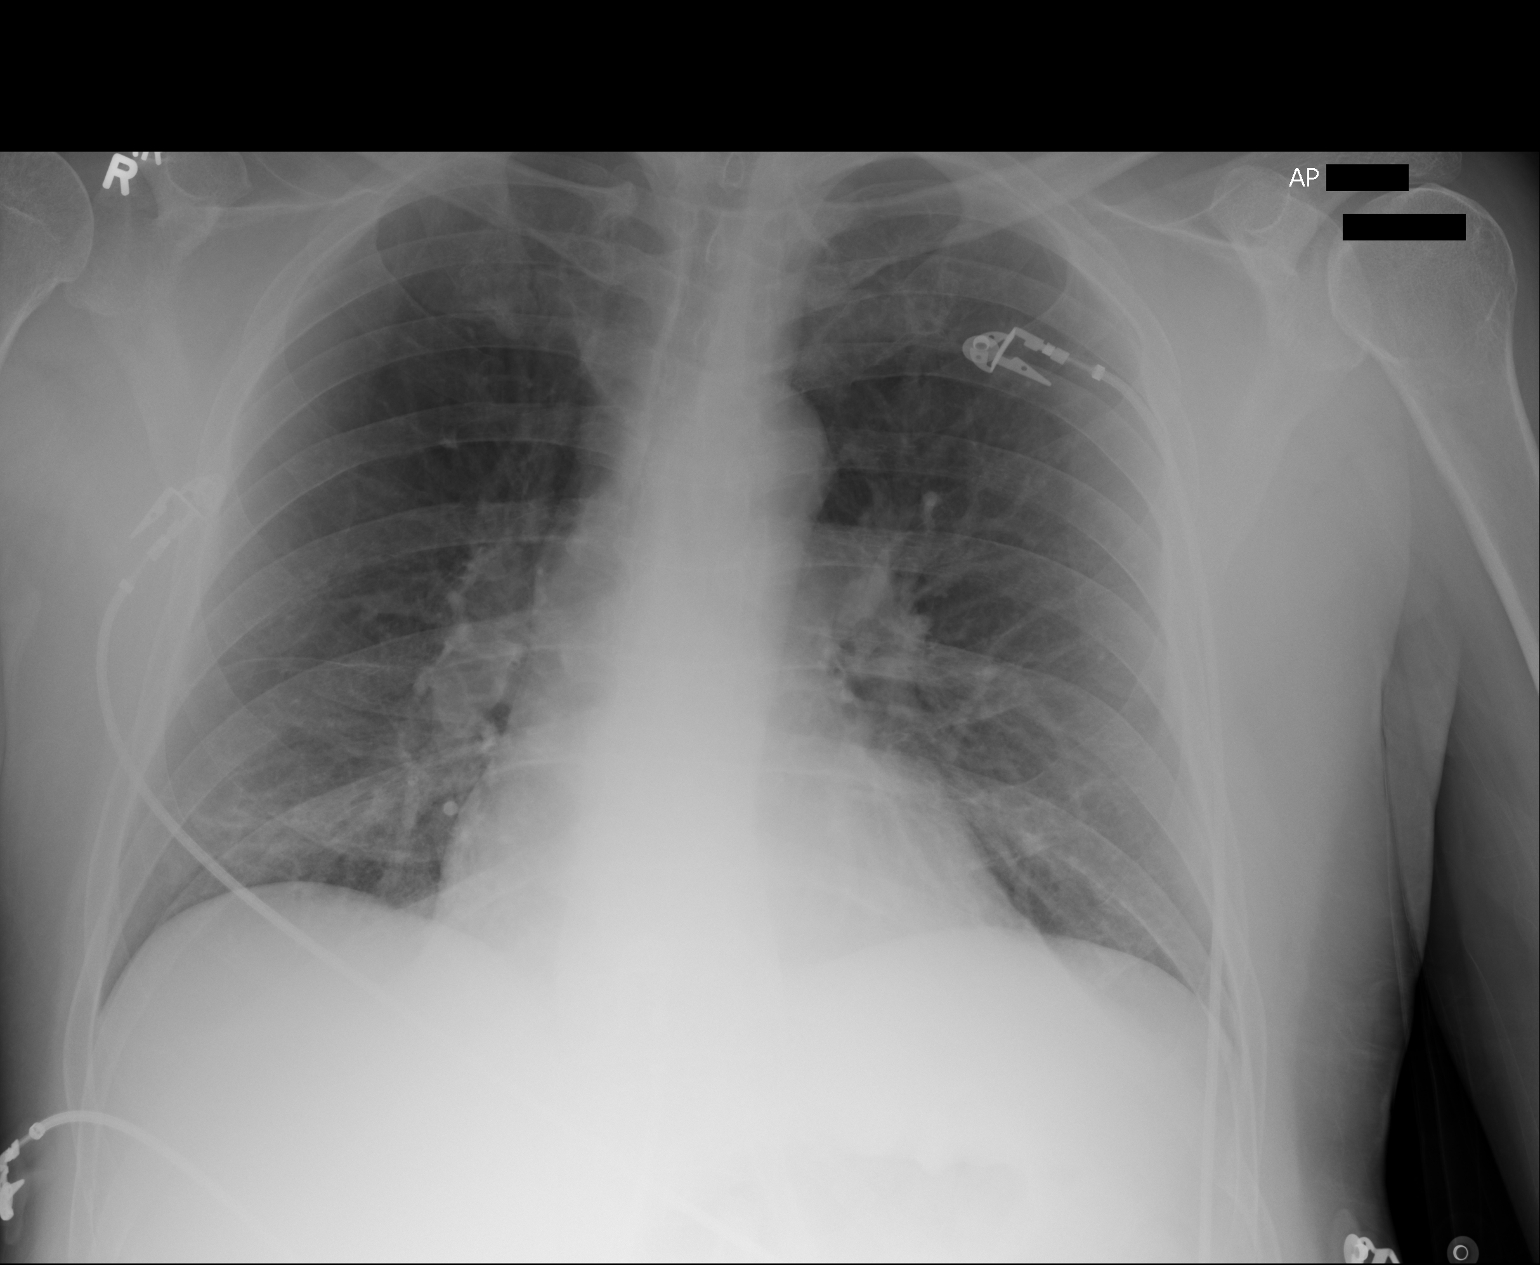

[1 of 1 positions shown; findings below may reference images not displayed]

FINDINGS: Cardiac shadow is within normal limits. The lungs are well aerated
bilaterally. Some minimal residual infiltrate is noted in the right
lung base although significantly improved from the prior study. No
new focal abnormality is noted.
IMPRESSION: Minimal residual infiltrate in the right base. No new focal
abnormality is noted.

## 2015-03-07 ENCOUNTER — Other Ambulatory Visit: Payer: Self-pay | Admitting: Family Medicine

## 2015-03-07 DIAGNOSIS — F323 Major depressive disorder, single episode, severe with psychotic features: Secondary | ICD-10-CM | POA: Diagnosis not present

## 2015-03-16 ENCOUNTER — Other Ambulatory Visit: Payer: Self-pay | Admitting: Family Medicine

## 2015-03-21 DIAGNOSIS — F411 Generalized anxiety disorder: Secondary | ICD-10-CM | POA: Diagnosis not present

## 2015-03-21 DIAGNOSIS — F323 Major depressive disorder, single episode, severe with psychotic features: Secondary | ICD-10-CM | POA: Diagnosis not present

## 2015-04-02 ENCOUNTER — Ambulatory Visit (INDEPENDENT_AMBULATORY_CARE_PROVIDER_SITE_OTHER): Payer: Medicare Other | Admitting: Family Medicine

## 2015-04-02 ENCOUNTER — Encounter: Payer: Self-pay | Admitting: Family Medicine

## 2015-04-02 VITALS — BP 132/69 | HR 61 | Temp 97.3°F | Resp 16 | Ht 70.0 in | Wt 180.0 lb

## 2015-04-02 DIAGNOSIS — J432 Centrilobular emphysema: Secondary | ICD-10-CM | POA: Diagnosis not present

## 2015-04-02 DIAGNOSIS — I10 Essential (primary) hypertension: Secondary | ICD-10-CM | POA: Diagnosis not present

## 2015-04-02 DIAGNOSIS — Z9981 Dependence on supplemental oxygen: Secondary | ICD-10-CM | POA: Diagnosis not present

## 2015-04-02 DIAGNOSIS — J9612 Chronic respiratory failure with hypercapnia: Secondary | ICD-10-CM

## 2015-04-02 DIAGNOSIS — F17200 Nicotine dependence, unspecified, uncomplicated: Secondary | ICD-10-CM

## 2015-04-02 DIAGNOSIS — R079 Chest pain, unspecified: Secondary | ICD-10-CM | POA: Diagnosis not present

## 2015-04-02 DIAGNOSIS — I129 Hypertensive chronic kidney disease with stage 1 through stage 4 chronic kidney disease, or unspecified chronic kidney disease: Secondary | ICD-10-CM

## 2015-04-02 DIAGNOSIS — N182 Chronic kidney disease, stage 2 (mild): Secondary | ICD-10-CM | POA: Diagnosis not present

## 2015-04-02 NOTE — Progress Notes (Signed)
Subjective:    Patient ID: David Alpers., male    DOB: August 02, 1956, 59 y.o.   MRN: [id number]  HPI: David Valdez. is a 59 y.o. male presenting on 04/02/2015 for Hypertension and COPD   Hypertension This is a chronic problem. The problem has been gradually improving since onset. The problem is controlled. Associated symptoms include chest pain and shortness of breath. Pertinent negatives include no blurred vision, headaches, malaise/fatigue, neck pain, palpitations, peripheral edema or sweats. Risk factors for coronary artery disease include smoking/tobacco exposure and sedentary lifestyle. Past treatments include ACE inhibitors and calcium channel blockers. Hypertensive end-organ damage includes CVA.   COPD: Wears 2L [place] at night. Reports chest tightness when active. Pt is using ventolin inhaler 4 times daily. Cough- not productive of sputum.  Chest pain: Reports chest discomfort with exertion. Had in the past- started about 1 month ago. Does not occur at rest. No associated nausea, vomiting, or shortness of breath.  Pt denies cocaine use in the recent past. Pt has also quit smoking.   Pt is due for a colonoscopy. He is refusing a colonoscopy at this point.  Past Medical History  Diagnosis Date  . Hypertension   . GERD (gastroesophageal reflux disease)   . Bipolar disorder   . COPD (chronic obstructive pulmonary disease)   . Shortness of breath   . Insomnia, controlled   . Stroke   . Oxygen deficiency   . Hyperlipidemia     Current Outpatient Prescriptions on File Prior to Visit  Medication Sig  . ADVAIR DISKUS 250-50 MCG/DOSE AEPB INHALE 1 PUFF PO BID  . albuterol (VENTOLIN HFA) 108 (90 BASE) MCG/ACT inhaler Inhale 2 puffs into the lungs 4 (four) times daily as needed.  [name] amLODipine (NORVASC) 10 MG tablet TK 1 T PO QD  . aspirin 325 MG EC tablet Take 1 tablet (325 mg total) by mouth daily.  [name] atorvastatin (LIPITOR) 40 MG tablet Take 1 tablet (40 mg total) by mouth  daily.  [name] gabapentin (NEURONTIN) 300 MG capsule TK 2 CS PO NIGHTLY  . HIBICLENS 4 % external liquid U ON AFFECTED AREA BID  . hydrALAZINE (APRESOLINE) 25 MG tablet TK 1 T PO TID  . hydrochlorothiazide (HYDRODIURIL) 25 MG tablet   . isosorbide mononitrate (IMDUR) 30 MG 24 hr tablet TK 1 T PO D.  . lisinopril (PRINIVIL,ZESTRIL) 30 MG tablet   . metoprolol (LOPRESSOR) 50 MG tablet   . nitroGLYCERIN (NITROSTAT) 0.3 MG SL tablet Place 1 tablet (0.3 mg total) under the tongue every 5 (five) minutes as needed for chest pain.  [name] omeprazole (PRILOSEC) 20 MG capsule Take 1 capsule (20 mg total) by mouth daily.   No current facility-administered medications on file prior to visit.    Review of Systems  Constitutional: Negative for fever, chills and malaise/fatigue.  Eyes: Negative for blurred vision.  Respiratory: Positive for shortness of breath.   Cardiovascular: Positive for chest pain. Negative for palpitations.  Gastrointestinal: Negative for nausea, vomiting, abdominal pain and abdominal distention.  Endocrine: Negative for polydipsia, polyphagia and polyuria.  Musculoskeletal: Negative for neck pain.  Neurological: Negative for dizziness, syncope, weakness and headaches.  Psychiatric/Behavioral: Negative for suicidal ideas and dysphoric mood.   Per HPI unless specifically indicated above     Objective:    BP 132/69 mmHg  Pulse 61  Temp(Src) 97.3 F (36.3 C) (Oral)  Resp 16  Ht '5\' 10"'$  (1.778 m)  Wt 180 lb (81.647 kg)  BMI 25.83  kg/m2  Wt Readings from Last 3 Encounters:  04/02/15 180 lb (81.647 kg)  01/27/15 166 lb (75.297 kg)  01/03/15 163 lb 6.1 oz (74.109 kg)    Physical Exam  Constitutional: He is oriented to person, place, and time. He appears well-developed and well-nourished. No distress.  Neck: Normal range of motion. Neck supple.  Cardiovascular: Regular rhythm, S1 normal and S2 normal.  Bradycardia present.   Pulmonary/Chest: Effort normal. He has decreased breath  sounds in the right lower field and the left lower field. He has no wheezes. He has no rhonchi. He has no rales.  Neurological: He is alert and oriented to person, place, and time. No cranial nerve deficit or sensory deficit.  R sided weakness and hemiplegia s/p stroke.   Skin: Skin is warm and dry. He is not diaphoretic. No pallor.  Psychiatric: He has a normal mood and affect. His behavior is normal. Judgment and thought content normal.   Results for orders placed or performed in visit on 04/02/15  Lipid panel  Result Value Ref Range   Triglycerides 105 40 - 160 mg/dL   Cholesterol 174 0 - 200 mg/dL   HDL 41 35 - 70 mg/dL   LDL Cholesterol 112 mg/dL      Assessment & Plan:   Problem List Items Addressed This Visit      Cardiovascular and Mediastinum   Benign hypertension with CKD (chronic kidney disease), stage II    Controlled. Continue current regimen. On ACE for renal protection. Alarm symptoms reviewed.  DASH diet reviewed. Pt encouraged to continue smoking cessation.         Respiratory   Chronic respiratory failure    2/2 COPD with emphysema. Requiring home O2 therapy. On Advair and ventolin.       COPD (chronic obstructive pulmonary disease) with emphysema    Possible cause of exertional chest pain. Spirometry done today FEV/FVC ratio: 42 post bronchodilator. Severe obstruction with good post-bronchodilator response. Continue current home regimen. Consider pulmonology referral after cardiac clearance.       Relevant Orders   Spirometry with Graph (Completed)     Other   Compulsive tobacco user syndrome    Pt reports he recently quit smoking in past 3 weeks. Encouraged him to keep up smoking cessation. Referred to [facility] Quitline resources.       On home oxygen therapy    2L [place] at night. Desats to 85% during sleep on RA.        Other Visit Diagnoses    Chest pain on exertion    -  Primary    Cardiac vs. respiraotyr etiology. EKG WNL. Pt has never had cardiology  evaluation. Refer for evaluation. Alarm symptoms reviewed.     Relevant Orders    EKG 12-Lead    Ambulatory referral to Cardiology       Meds ordered this encounter  Medications  . diclofenac sodium (VOLTAREN) 1 % GEL    Sig: Apply 2 g topically 4 (four) times daily as needed.  . DULoxetine (CYMBALTA) 60 MG capsule    Sig: Take 60 mg by mouth daily.    Refill:  0  . traZODone (DESYREL) 100 MG tablet    Sig: Take 100 mg by mouth daily.    Refill:  0  . OXYGEN    Sig: 2 L by Does not apply route at bedtime.  . Brexpiprazole (REXULTI) 2 MG TABS    Sig: Take 2 mg by mouth at bedtime.  Follow up plan: Return in about 3 months (around 07/03/2015), or if symptoms worsen or fail to improve.

## 2015-04-02 NOTE — Assessment & Plan Note (Signed)
Pt reports he recently quit smoking in past 3 weeks. Encouraged him to keep up smoking cessation. Referred to [facility] Quitline resources.

## 2015-04-02 NOTE — Patient Instructions (Signed)
Chest pain: We will have you seen by cardiology to work-up your chest pain. While waiting to be seen by cardiology if the chest pain returns or worsens, you must go to the ER.   Please seek immediate medical attention at ER or Urgent Care if you develop: Chest pain, pressure or tightness. Shortness of breath accompanied by nausea or diaphoresis Visual changes Numbness or tingling on one side of the body Facial droop Altered mental status Or any concerning symptoms.  Your goal blood pressure is 140/90. Work on low salt/sodium diet - goal <2.5gm (2,'500mg'$ ) per day. Eat a diet high in fruits/vegetables and whole grains.  Look into mediterranean and DASH diet. Goal activity is 113mn/wk of moderate intensity exercise.  This can be split into 30 minute chunks.  If you are not at this level, you can start with smaller 10-15 min increments and slowly build up activity. [place] at [email or web address] for more resources

## 2015-04-02 NOTE — Assessment & Plan Note (Signed)
2/2 COPD with emphysema. Requiring home O2 therapy. On Advair and ventolin.

## 2015-04-02 NOTE — Assessment & Plan Note (Addendum)
Possible cause of exertional chest pain. Spirometry done today FEV/FVC ratio: 42 post bronchodilator. Severe obstruction with good post-bronchodilator response. Continue current home regimen. Consider pulmonology referral after cardiac clearance.

## 2015-04-02 NOTE — Assessment & Plan Note (Signed)
2L [place] at night. Desats to 85% during sleep on RA.

## 2015-04-02 NOTE — Assessment & Plan Note (Signed)
Controlled. Continue current regimen. On ACE for renal protection. Alarm symptoms reviewed.  DASH diet reviewed. Pt encouraged to continue smoking cessation.

## 2015-04-09 ENCOUNTER — Encounter: Payer: Self-pay | Admitting: *Deleted

## 2015-04-11 ENCOUNTER — Telehealth: Payer: Self-pay | Admitting: Family Medicine

## 2015-04-11 NOTE — Telephone Encounter (Signed)
[name] from Dr. Etta Quill office needs office notes, labs, EKG and any chest xrays faxed to [phone].  Pt has appt scheduled on Tuesday Sept 6th.

## 2015-04-15 DIAGNOSIS — R911 Solitary pulmonary nodule: Secondary | ICD-10-CM | POA: Diagnosis not present

## 2015-04-15 DIAGNOSIS — I639 Cerebral infarction, unspecified: Secondary | ICD-10-CM | POA: Diagnosis not present

## 2015-04-15 DIAGNOSIS — R0602 Shortness of breath: Secondary | ICD-10-CM | POA: Diagnosis not present

## 2015-04-15 DIAGNOSIS — R600 Localized edema: Secondary | ICD-10-CM | POA: Diagnosis not present

## 2015-04-15 DIAGNOSIS — I10 Essential (primary) hypertension: Secondary | ICD-10-CM | POA: Diagnosis not present

## 2015-04-15 DIAGNOSIS — K219 Gastro-esophageal reflux disease without esophagitis: Secondary | ICD-10-CM | POA: Diagnosis not present

## 2015-04-15 DIAGNOSIS — N182 Chronic kidney disease, stage 2 (mild): Secondary | ICD-10-CM | POA: Diagnosis not present

## 2015-04-15 DIAGNOSIS — I208 Other forms of angina pectoris: Secondary | ICD-10-CM | POA: Diagnosis not present

## 2015-04-20 ENCOUNTER — Other Ambulatory Visit: Payer: Self-pay | Admitting: Family Medicine

## 2015-04-24 DIAGNOSIS — R0602 Shortness of breath: Secondary | ICD-10-CM | POA: Diagnosis not present

## 2015-05-15 DIAGNOSIS — R600 Localized edema: Secondary | ICD-10-CM | POA: Diagnosis not present

## 2015-05-15 DIAGNOSIS — R0602 Shortness of breath: Secondary | ICD-10-CM | POA: Diagnosis not present

## 2015-05-15 DIAGNOSIS — K219 Gastro-esophageal reflux disease without esophagitis: Secondary | ICD-10-CM | POA: Diagnosis not present

## 2015-05-15 DIAGNOSIS — I209 Angina pectoris, unspecified: Secondary | ICD-10-CM | POA: Diagnosis not present

## 2015-05-15 DIAGNOSIS — N182 Chronic kidney disease, stage 2 (mild): Secondary | ICD-10-CM | POA: Diagnosis not present

## 2015-05-15 DIAGNOSIS — I10 Essential (primary) hypertension: Secondary | ICD-10-CM | POA: Diagnosis not present

## 2015-05-15 DIAGNOSIS — R9431 Abnormal electrocardiogram [ECG] [EKG]: Secondary | ICD-10-CM | POA: Diagnosis not present

## 2015-05-15 DIAGNOSIS — J449 Chronic obstructive pulmonary disease, unspecified: Secondary | ICD-10-CM | POA: Diagnosis not present

## 2015-05-24 ENCOUNTER — Other Ambulatory Visit: Payer: Self-pay | Admitting: Family Medicine

## 2015-06-19 DIAGNOSIS — F4001 Agoraphobia with panic disorder: Secondary | ICD-10-CM | POA: Diagnosis not present

## 2015-07-08 ENCOUNTER — Ambulatory Visit (INDEPENDENT_AMBULATORY_CARE_PROVIDER_SITE_OTHER): Payer: Medicare Other | Admitting: Family Medicine

## 2015-07-08 ENCOUNTER — Encounter: Payer: Self-pay | Admitting: Family Medicine

## 2015-07-08 ENCOUNTER — Other Ambulatory Visit: Payer: Self-pay | Admitting: Family Medicine

## 2015-07-08 VITALS — BP 137/76 | HR 55 | Temp 97.4°F | Resp 16 | Ht 70.0 in | Wt 185.2 lb

## 2015-07-08 DIAGNOSIS — E785 Hyperlipidemia, unspecified: Secondary | ICD-10-CM | POA: Diagnosis not present

## 2015-07-08 DIAGNOSIS — N182 Chronic kidney disease, stage 2 (mild): Secondary | ICD-10-CM | POA: Diagnosis not present

## 2015-07-08 DIAGNOSIS — G8929 Other chronic pain: Secondary | ICD-10-CM | POA: Insufficient documentation

## 2015-07-08 DIAGNOSIS — Z1211 Encounter for screening for malignant neoplasm of colon: Secondary | ICD-10-CM | POA: Diagnosis not present

## 2015-07-08 DIAGNOSIS — M25561 Pain in right knee: Secondary | ICD-10-CM | POA: Diagnosis not present

## 2015-07-08 DIAGNOSIS — J432 Centrilobular emphysema: Secondary | ICD-10-CM

## 2015-07-08 DIAGNOSIS — R296 Repeated falls: Secondary | ICD-10-CM | POA: Diagnosis not present

## 2015-07-08 DIAGNOSIS — M25562 Pain in left knee: Secondary | ICD-10-CM | POA: Diagnosis not present

## 2015-07-08 DIAGNOSIS — I129 Hypertensive chronic kidney disease with stage 1 through stage 4 chronic kidney disease, or unspecified chronic kidney disease: Secondary | ICD-10-CM

## 2015-07-08 DIAGNOSIS — I639 Cerebral infarction, unspecified: Secondary | ICD-10-CM

## 2015-07-08 DIAGNOSIS — Z23 Encounter for immunization: Secondary | ICD-10-CM

## 2015-07-08 MED ORDER — GABAPENTIN 300 MG PO CAPS: 300.0000 mg | ORAL_CAPSULE | Freq: Three times a day (TID) | ORAL | Status: DC

## 2015-07-08 MED ORDER — HYDRALAZINE HCL 25 MG PO TABS: 25.0000 mg | ORAL_TABLET | Freq: Three times a day (TID) | ORAL | Status: DC

## 2015-07-08 MED ORDER — TRAMADOL HCL 50 MG PO TABS: 50.0000 mg | ORAL_TABLET | Freq: Three times a day (TID) | ORAL | Status: DC | PRN

## 2015-07-08 MED ORDER — ALBUTEROL SULFATE HFA 108 (90 BASE) MCG/ACT IN AERS: 2.0000 | INHALATION_SPRAY | Freq: Four times a day (QID) | RESPIRATORY_TRACT | Status: DC | PRN

## 2015-07-08 MED ORDER — AMLODIPINE BESYLATE 10 MG PO TABS: 10.0000 mg | ORAL_TABLET | Freq: Every day | ORAL | Status: DC

## 2015-07-08 NOTE — Progress Notes (Signed)
Subjective:    Patient ID: David Alpers., male    DOB: April 17, 1956, 59 y.o.   MRN: [id number]  HPI: David Dunaj. is a 59 y.o. male presenting on 07/08/2015 for Hypertension   Hypertension This is a chronic problem. The problem is controlled. Pertinent negatives include no blurred vision, chest pain, headaches, palpitations, peripheral edema or shortness of breath. Risk factors for coronary artery disease include male gender. Past treatments include ACE inhibitors, calcium channel blockers, diuretics, direct vasodilators and lifestyle changes. The current treatment provides moderate improvement. Hypertensive end-organ damage includes kidney disease and CVA.   Pt presents for BP follow-up.  Controlled today. Not checking BP at home- has trouble using his meter by himself. Occasional dizziness when he changes position quickly. No HA, no visual changes, no chest pain.   Recurrent falls: Not falling as much. Has been unable to get to BP due to home O2 and inability to leave house safely.  [place] last week- no injury.  COPD: Breathing is doing well. Home O2 at night and occasional use during the day.  Requesting refill of rescue inhaler today.   Pt has chronic pain 2/2 nerve damage from stroke. Pain is located mainly on R side.  He is taking gabapentin but it is not controlling his pain. Formerly seen by chronic pain at [facility] but unable to get there due to insurance and ride. Requesting pain referral in [facility].  Pt is reporting increased pain in both knees today.  Unable to ambulate without pain.   Past Medical History  Diagnosis Date  . Hypertension   . GERD (gastroesophageal reflux disease)   . Bipolar disorder ([place])   . Shortness of breath   . Insomnia, controlled   . Oxygen deficiency   . Hyperlipidemia   . COPD (chronic obstructive pulmonary disease) ([place])     on home oxygen.   . Substance abuse 2016    cocaine use.  . Stroke [facility])     R sided hemiplegia     Current Outpatient Prescriptions on File Prior to Visit  Medication Sig  . ADVAIR DISKUS 250-50 MCG/DOSE AEPB INHALE 1 PUFF PO BID  . aspirin 325 MG EC tablet Take 1 tablet (325 mg total) by mouth daily.  [name] atorvastatin (LIPITOR) 40 MG tablet Take 1 tablet (40 mg total) by mouth daily.  . Brexpiprazole (REXULTI) 2 MG TABS Take 2 mg by mouth at bedtime.  . isosorbide mononitrate (IMDUR) 30 MG 24 hr tablet TK 1 T PO D.  . lisinopril (PRINIVIL,ZESTRIL) 30 MG tablet   . metoprolol (LOPRESSOR) 50 MG tablet   . nitroGLYCERIN (NITROSTAT) 0.3 MG SL tablet Place 1 tablet (0.3 mg total) under the tongue every 5 (five) minutes as needed for chest pain.  [name] omeprazole (PRILOSEC) 20 MG capsule Take 1 capsule (20 mg total) by mouth daily.  . OXYGEN 2 L by Does not apply route at bedtime.  [name] HIBICLENS 4 % external liquid U ON AFFECTED AREA BID   No current facility-administered medications on file prior to visit.    Review of Systems  Constitutional: Negative for fever and chills.  HENT: Negative.   Eyes: Negative for blurred vision.  Respiratory: Negative for chest tightness, shortness of breath and wheezing.   Cardiovascular: Negative for chest pain, palpitations and leg swelling.  Gastrointestinal: Negative for nausea, vomiting and abdominal pain.  Endocrine: Negative.   Genitourinary: Negative for dysuria, urgency, discharge, penile pain and testicular pain.  Musculoskeletal: Positive  for arthralgias and gait problem (hemiplegic). Negative for back pain and joint swelling.  Skin: Negative.   Neurological: Negative for dizziness, weakness, numbness and headaches.  Psychiatric/Behavioral: Negative for sleep disturbance and dysphoric mood.   Per HPI unless specifically indicated above     Objective:    BP 137/76 mmHg  Pulse 55  Temp(Src) 97.4 F (36.3 C) (Oral)  Resp 16  Ht '5\' 10"'$  (1.778 m)  Wt 185 lb 3.2 oz (84.006 kg)  BMI 26.57 kg/m2  SpO2 96%  Wt Readings from Last 3  Encounters:  07/08/15 185 lb 3.2 oz (84.006 kg)  04/02/15 180 lb (81.647 kg)  01/27/15 166 lb (75.297 kg)    Physical Exam  Constitutional: He is oriented to person, place, and time. He appears well-developed and well-nourished. No distress.  HENT:  Head: Normocephalic and atraumatic.  Neck: Neck supple. No thyromegaly present.  Cardiovascular: Normal rate, regular rhythm and normal heart sounds.  Exam reveals no gallop and no friction rub.   No murmur heard. Pulmonary/Chest: Effort normal and breath sounds normal. He has no wheezes.  Abdominal: Soft. Bowel sounds are normal. He exhibits no distension. There is no tenderness. There is no rebound.  Musculoskeletal: Normal range of motion. He exhibits no edema or tenderness.  Neurological: He is alert and oriented to person, place, and time. He has normal reflexes. He displays atrophy (R side). He displays no tremor. No cranial nerve deficit. He exhibits normal muscle tone. He displays a negative Romberg sign. He displays no seizure activity.  Skin: Skin is warm and dry. No rash noted. No erythema.  Psychiatric: He has a normal mood and affect. His behavior is normal. Thought content normal.   Results for orders placed or performed in visit on 04/02/15  Lipid panel  Result Value Ref Range   Triglycerides 105 40 - 160 mg/dL   Cholesterol 174 0 - 200 mg/dL   HDL 41 35 - 70 mg/dL   LDL Cholesterol 112 mg/dL      Assessment & Plan:   Problem List Items Addressed This Visit      Cardiovascular and Mediastinum   Benign hypertension with CKD (chronic kidney disease), stage II   Relevant Medications   amLODipine (NORVASC) 10 MG tablet   Paralytic stroke (HCC)   Relevant Medications   amLODipine (NORVASC) 10 MG tablet   Other Relevant Orders   Ambulatory referral to [place]     Respiratory   COPD (chronic obstructive pulmonary disease) with emphysema (HCC)   Relevant Medications   albuterol (VENTOLIN HFA) 108 (90 BASE) MCG/ACT  inhaler     Other   Dyslipidemia   Excessive falling   Relevant Orders   Ambulatory referral to [place]   Chronic pain   Relevant Medications   DULoxetine (CYMBALTA) 30 MG capsule   traZODone (DESYREL) 150 MG tablet   gabapentin (NEURONTIN) 300 MG capsule   traMADol (ULTRAM) 50 MG tablet   Other Relevant Orders   Ambulatory referral to Pain Clinic    Other Visit Diagnoses    Need for influenza vaccination    -  Primary    Relevant Orders    Flu Vaccine QUAD 36+ mos PF IM (Fluarix & Fluzone Quad PF) (Completed)    Bilateral knee pain        Relevant Medications    traMADol (ULTRAM) 50 MG tablet    Other Relevant Orders    Ambulatory referral to Pain Clinic    Ambulatory referral to Home  Health    Screening for colon cancer        Relevant Orders    Cologuard       Meds ordered this encounter  Medications  . DULoxetine (CYMBALTA) 30 MG capsule    Sig: TK 3 CS PO QAM    Refill:  0  . traZODone (DESYREL) 150 MG tablet    Sig: TK 2 TS PO QHS    Refill:  0  . gabapentin (NEURONTIN) 300 MG capsule    Sig: Take 1 capsule (300 mg total) by mouth 3 (three) times daily.    Dispense:  270 capsule    Refill:  3    Order Specific Question:  Supervising Provider    Answer:  Arlis Porta [[id number]]  . traMADol (ULTRAM) 50 MG tablet    Sig: Take 1 tablet (50 mg total) by mouth every 8 (eight) hours as needed.    Dispense:  30 tablet    Refill:  0    Order Specific Question:  Supervising Provider    Answer:  Arlis Porta [phone]  . amLODipine (NORVASC) 10 MG tablet    Sig: Take 1 tablet (10 mg total) by mouth daily.    Dispense:  90 tablet    Refill:  3    Order Specific Question:  Supervising Provider    Answer:  Arlis Porta [phone]  . DISCONTD: hydrALAZINE (APRESOLINE) 25 MG tablet    Sig: Take 1 tablet (25 mg total) by mouth 3 (three) times daily.    Dispense:  90 tablet    Refill:  3    Order Specific Question:  Supervising Provider    Answer:   Arlis Porta [phone]  . albuterol (VENTOLIN HFA) 108 (90 BASE) MCG/ACT inhaler    Sig: Inhale 2 puffs into the lungs 4 (four) times daily as needed.    Dispense:  1 Inhaler    Refill:  11    Order Specific Question:  Supervising Provider    Answer:  Arlis Porta [[id number]]      Follow up plan: Return in about 3 months (around 10/07/2015) for HTN.

## 2015-07-08 NOTE — Patient Instructions (Signed)
Keep up good work with BP.    Your goal blood pressure is 140/90. Work on low salt/sodium diet - goal <1.5gm (1,'500mg'$ ) per day. Eat a diet high in fruits/vegetables and whole grains.  Look into mediterranean and DASH diet. Goal activity is 174mn/wk of moderate intensity exercise.  This can be split into 30 minute chunks.  If you are not at this level, you can start with smaller 10-15 min increments and slowly build up activity. Look at [facility] for more resources  Please seek immediate medical attention at ER or Urgent Care if you develop: Chest pain, pressure or tightness. Shortness of breath accompanied by nausea or diaphoresis Visual changes Numbness or tingling on one side of the body Facial droop Altered mental status Or any concerning symptoms.

## 2015-07-10 ENCOUNTER — Telehealth: Payer: Self-pay | Admitting: *Deleted

## 2015-07-10 NOTE — Telephone Encounter (Signed)
-----   Message from Darlina Guys sent at 07/09/2015  4:12 PM EST ----- Regarding: RE: referral for home health servicies Yes, we can. Thanks! ----- Message -----    From: Devona Konig, CMA    Sent: 07/09/2015   9:25 AM      To: Gilda Crease Subject: referral for home health servicies             Good Morning! Are you all able to assist with this referral.

## 2015-07-10 NOTE — Telephone Encounter (Signed)
Referral will be taken care of by [facility].

## 2015-07-11 ENCOUNTER — Telehealth: Payer: Self-pay | Admitting: Family Medicine

## 2015-07-11 ENCOUNTER — Encounter (INDEPENDENT_AMBULATORY_CARE_PROVIDER_SITE_OTHER): Payer: Medicare Other | Admitting: Family Medicine

## 2015-07-11 DIAGNOSIS — J432 Centrilobular emphysema: Secondary | ICD-10-CM

## 2015-07-11 NOTE — Telephone Encounter (Signed)
Please let David Hall know i have completed his forms. However, there is a portion of the form he must complete. I will leave them upfront and he can pick them up. Thanks! Ak

## 2015-07-11 NOTE — Progress Notes (Signed)
Physician verification form re certification completed for patient.

## 2015-07-16 ENCOUNTER — Telehealth: Payer: Self-pay | Admitting: Family Medicine

## 2015-07-16 NOTE — Telephone Encounter (Signed)
Santiago Glad from  [facility] states that pt  BP was up  Gave  Info to [employer]

## 2015-07-16 NOTE — Telephone Encounter (Signed)
Called and [place].   1. Ensure patient took his BP medications today.  2. Can he recheck his BP at home? If still elevated today 4th dose of hydralazine '25mg'$ .  He is currently taking TID but he can take four times daily. 3. Recheck BP tomorrow- if it is still elevated I will need to see him in the clinic to adjust medications.  Thanks!  AK
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
## 2015-07-24 DIAGNOSIS — Z1212 Encounter for screening for malignant neoplasm of rectum: Secondary | ICD-10-CM | POA: Diagnosis not present

## 2015-07-24 DIAGNOSIS — Z1211 Encounter for screening for malignant neoplasm of colon: Secondary | ICD-10-CM | POA: Diagnosis not present

## 2015-07-25 ENCOUNTER — Ambulatory Visit: Payer: Self-pay | Admitting: Family Medicine

## 2015-07-25 ENCOUNTER — Ambulatory Visit (INDEPENDENT_AMBULATORY_CARE_PROVIDER_SITE_OTHER): Payer: Medicare Other | Admitting: Family Medicine

## 2015-07-25 ENCOUNTER — Telehealth: Payer: Self-pay

## 2015-07-25 VITALS — BP 160/72 | HR 64 | Temp 98.4°F | Resp 16 | Ht 70.0 in

## 2015-07-25 DIAGNOSIS — I10 Essential (primary) hypertension: Secondary | ICD-10-CM

## 2015-07-25 DIAGNOSIS — R413 Other amnesia: Secondary | ICD-10-CM

## 2015-07-25 DIAGNOSIS — I639 Cerebral infarction, unspecified: Secondary | ICD-10-CM | POA: Diagnosis not present

## 2015-07-25 DIAGNOSIS — F1410 Cocaine abuse, uncomplicated: Secondary | ICD-10-CM | POA: Diagnosis not present

## 2015-07-25 DIAGNOSIS — R296 Repeated falls: Secondary | ICD-10-CM

## 2015-07-25 DIAGNOSIS — R55 Syncope and collapse: Secondary | ICD-10-CM | POA: Diagnosis not present

## 2015-07-25 LAB — POCT URINALYSIS DIPSTICK
Bilirubin, UA: NEGATIVE
Blood, UA: NEGATIVE
Glucose, UA: NEGATIVE
Ketones, UA: NEGATIVE
Leukocytes, UA: NEGATIVE
Nitrite, UA: NEGATIVE
Protein, UA: NEGATIVE
Spec Grav, UA: 1.01
Urobilinogen, UA: NEGATIVE
pH, UA: 5

## 2015-07-25 LAB — COLOGUARD: Cologuard: NEGATIVE

## 2015-07-25 MED ORDER — HYDRALAZINE HCL 25 MG PO TABS: 25.0000 mg | ORAL_TABLET | Freq: Four times a day (QID) | ORAL | Status: DC

## 2015-07-25 NOTE — Patient Instructions (Signed)
It is my strong recommendation that you go to the ER for imaging of your head to rule out a stroke.  I understand that you are unwilling to go at this time.  Please call 911 if you develop the following: Chest pain, pressure or tightness. Shortness of breath accompanied by nausea or diaphoresis Visual changes Numbness or tingling on one side of the body Facial droop Altered mental status Increased memory loss Or any concerning symptoms.

## 2015-07-25 NOTE — Telephone Encounter (Signed)
Thank you. I will close this. AK

## 2015-07-25 NOTE — Assessment & Plan Note (Signed)
Unsure if pt lost consciousness or memory loss related. Have strongly recommended pt go to the ER. Pt has refused due to lack of care for his dog. Pt understands the risk is he taking in refusing emergency evaluation. Pt will call 911 if symptoms return.  Referred to Neurology for evaluation.  ECG baseline.  UA normal.  Pt is refusing labwork.
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
## 2015-07-25 NOTE — Telephone Encounter (Signed)
Home Health called for verbal to keep transportation for 2017. I gave verbal.JH

## 2015-07-25 NOTE — Assessment & Plan Note (Signed)
Pt reports not recent drug abuse. Substance abuse could account for memory loss. Toxasure ordered today.

## 2015-07-25 NOTE — Assessment & Plan Note (Signed)
TIA/stroke vs substance abuse. Have strongly urged pt to go to ER today for evaluation for stroke or TIA. Pt has refused. Pt is aware of the risks. Pt states he will go to ER on Monday for evaluation. Alarm symptoms reviewed. Pt will call 911 if he passes out again, falls, altered mental status, feels sleepy.  Pt is refusing lab work today because he cannot get to lab.  UA normal.

## 2015-07-25 NOTE — Assessment & Plan Note (Signed)
Increase hydralazine to '25mg'$  QID. Strict instructions to check BP closely. If BP > 200/ >100 with symptoms. Go to the ER.  Pt is aware of need to control BP to prevent further stroke. Pt refusing labwork this visit.

## 2015-07-31 ENCOUNTER — Encounter: Payer: Self-pay | Admitting: *Deleted

## 2015-08-01 LAB — TOXASSURE SELECT 13 (MW), URINE: PDF: 0

## 2015-08-05 ENCOUNTER — Telehealth: Payer: Self-pay | Admitting: Family Medicine

## 2015-08-05 NOTE — Telephone Encounter (Signed)
David Hall with [facility] said pt's BP was elevated while she was there for a visit.  It was 158/102 and 172/102.  Her call back number is [phone]

## 2015-08-06 NOTE — Telephone Encounter (Signed)
Patient aware.

## 2015-08-06 NOTE — Telephone Encounter (Signed)
Please call David Hall and ask him to schedule an appt for his blood pressure.  Either Friday or Tuesday. In the meantime he should make sure he is taking hydralazine 4 times daily.  Continue to check BP at home. If he experiences chest pain, shortness of breath, visual changes, or severe HA he needs to call EMS for evaluation or go to ER. Thanks! AK

## 2015-08-07 ENCOUNTER — Other Ambulatory Visit: Payer: Self-pay | Admitting: Family Medicine

## 2015-08-08 ENCOUNTER — Encounter: Payer: Self-pay | Admitting: Family Medicine

## 2015-08-12 ENCOUNTER — Ambulatory Visit: Payer: Self-pay | Admitting: Family Medicine

## 2015-08-15 ENCOUNTER — Telehealth: Payer: Self-pay | Admitting: Family Medicine

## 2015-08-15 NOTE — Telephone Encounter (Signed)
done

## 2015-08-15 NOTE — Telephone Encounter (Signed)
Can one of you give a verbal order from me for an extension of his home health PT. See Emily's call back number below. Thanks! AK

## 2015-08-15 NOTE — Telephone Encounter (Signed)
David Hall a physical therapist with [facility] asked for a 3 week extension for pt's home health physical therapy.  She said his BP is much better.  She thought the issue before was that he was missing 2 of his medications .  Her call back number is [phone]

## 2015-08-19 ENCOUNTER — Ambulatory Visit: Payer: Self-pay | Admitting: Family Medicine

## 2015-08-22 ENCOUNTER — Telehealth: Payer: Self-pay | Admitting: Family Medicine

## 2015-08-22 MED ORDER — LISINOPRIL 40 MG PO TABS: 40.0000 mg | ORAL_TABLET | Freq: Every day | ORAL | Status: DC

## 2015-08-22 NOTE — Telephone Encounter (Signed)
Called and spoke with patient regarding his blood pressure. He is not having any symptoms of dizziness, CP, SOB, or visual changes. We will go up on his lisinopril to '40mg'$  daily until he can see me next week for an appt. IF he develops any symptoms including CP, SOB, dizziness, or visual changes with elevated BP proceed to ER.  Pt will monitor BP at home.

## 2015-08-22 NOTE — Telephone Encounter (Signed)
Received a call from home health  David Hall  after shower BP was 200/70 left arm, 20 minutes later BP was 178/70 sitting

## 2015-08-28 ENCOUNTER — Encounter: Payer: Self-pay | Admitting: Family Medicine

## 2015-08-28 ENCOUNTER — Ambulatory Visit (INDEPENDENT_AMBULATORY_CARE_PROVIDER_SITE_OTHER): Payer: Medicare Other | Admitting: Family Medicine

## 2015-08-28 VITALS — BP 149/92 | HR 91 | Temp 97.6°F | Resp 16 | Ht 70.0 in | Wt 185.6 lb

## 2015-08-28 DIAGNOSIS — F411 Generalized anxiety disorder: Secondary | ICD-10-CM | POA: Diagnosis not present

## 2015-08-28 DIAGNOSIS — I10 Essential (primary) hypertension: Secondary | ICD-10-CM

## 2015-08-28 DIAGNOSIS — F1410 Cocaine abuse, uncomplicated: Secondary | ICD-10-CM

## 2015-08-28 DIAGNOSIS — Z0289 Encounter for other administrative examinations: Secondary | ICD-10-CM | POA: Diagnosis not present

## 2015-08-28 DIAGNOSIS — I69359 Hemiplegia and hemiparesis following cerebral infarction affecting unspecified side: Secondary | ICD-10-CM | POA: Diagnosis not present

## 2015-08-28 DIAGNOSIS — F323 Major depressive disorder, single episode, severe with psychotic features: Secondary | ICD-10-CM | POA: Diagnosis not present

## 2015-08-28 DIAGNOSIS — Z79891 Long term (current) use of opiate analgesic: Secondary | ICD-10-CM

## 2015-08-28 DIAGNOSIS — G8929 Other chronic pain: Secondary | ICD-10-CM

## 2015-08-28 MED ORDER — HYDROCHLOROTHIAZIDE 12.5 MG PO TABS: 12.5000 mg | ORAL_TABLET | Freq: Every day | ORAL | Status: DC

## 2015-08-28 MED ORDER — ADJUSTABLE ARM SLING MISC: 1.0000 | Freq: Every day | Status: DC

## 2015-08-28 NOTE — Assessment & Plan Note (Signed)
Pt requesting pain meds today. Left prior to opioid agreement being completed. Is aware he will need to return in 1 week to sign agreement to obtain pain medication. Urine drug screen done today.  Pt is awaiting chronic pain consult on 09/30/2014.

## 2015-08-28 NOTE — Assessment & Plan Note (Signed)
Uncontrolled today in the office. Likely 2/2 recent cocaine use. Have discussed risks with patien. Restart HCTZ at low dose. Will monitor BMET next visit.  Continue current regiment of ACE, BB, CCB, and hydralazine.  ACE for renal protection.  RTC 1 mos.

## 2015-08-28 NOTE — Addendum Note (Signed)
Addended by: Frederich Cha D on: 08/28/2015 09:56 AM   Modules accepted: Miquel Dunn

## 2015-08-28 NOTE — Patient Instructions (Signed)
We will add back HCTZ to your BP regimen. Please take once daily. We will follow up in 1 month on your BP.  Please make an appt in 1 week for pain management.   Your goal blood pressure is 140/90. Work on low salt/sodium diet - goal <1.5gm (1,'500mg'$ ) per day. Eat a diet high in fruits/vegetables and whole grains.  Look into mediterranean and DASH diet. Goal activity is 1104mn/wk of moderate intensity exercise.  This can be split into 30 minute chunks.  If you are not at this level, you can start with smaller 10-15 min increments and slowly build up activity. Look at [facility] for more resources  Please seek immediate medical attention at ER or Urgent Care if you develop: Chest pain, pressure or tightness. Shortness of breath accompanied by nausea or diaphoresis Visual changes Numbness or tingling on one side of the body Facial droop Altered mental status Or any concerning symptoms.

## 2015-08-28 NOTE — Progress Notes (Signed)
Subjective:    Patient ID: David Hall., male    DOB: April 08, 1956, 60 y.o.   MRN: [id number]  HPI: David Hall. is a 60 y.o. male presenting on 08/28/2015 for Hypertension   Hypertension This is a chronic problem. The problem is uncontrolled. Pertinent negatives include no chest pain, headaches, malaise/fatigue, neck pain, palpitations, peripheral edema or shortness of breath. Risk factors for coronary artery disease include dyslipidemia, obesity, male gender, smoking/tobacco exposure and sedentary lifestyle. Past treatments include ACE inhibitors, calcium channel blockers, beta blockers, direct vasodilators, diuretics and lifestyle changes. The current treatment provides moderate improvement. Hypertensive end-organ damage includes CVA and left ventricular hypertrophy.    Pt presents for hypertension follow-up. His BP was previously controlled but his home health RN has called stating BP 170/100 several times in the past month. Lisinopril '40mg'$ . Pt checks BP at home. No CP, SOB, visual changes or severe HA.   He is requesting medication for pain. He is going into pain management on Feb 22- his OT has has requested pain management for him. Chronic pain R side of body related to old stroke. He has a history of cocaine abuse. Was positive for cocaine 1 month ago. Denies any recent use. Is aware he will not get narcotics with any further cocaine use.    Also requesting Arm sling for R arm hemiparesis per OT.   Pt requested that visit be abbreviated at 820 due to another appt.    Past Medical History  Diagnosis Date  . Hypertension   . GERD (gastroesophageal reflux disease)   . Bipolar disorder ([place])   . Shortness of breath   . Insomnia, controlled   . Oxygen deficiency   . Hyperlipidemia   . COPD (chronic obstructive pulmonary disease) ([place])     on home oxygen.   . Substance abuse 2016    cocaine use.  . Stroke [facility])     R sided hemiplegia    Current Outpatient  Prescriptions on File Prior to Visit  Medication Sig  . ADVAIR DISKUS 250-50 MCG/DOSE AEPB INHALE 1 PUFF PO BID  . albuterol (VENTOLIN HFA) 108 (90 BASE) MCG/ACT inhaler Inhale 2 puffs into the lungs 4 (four) times daily as needed.  [name] amLODipine (NORVASC) 10 MG tablet Take 1 tablet (10 mg total) by mouth daily.  [name] aspirin 325 MG EC tablet Take 1 tablet (325 mg total) by mouth daily.  [name] atorvastatin (LIPITOR) 40 MG tablet TAKE 1 TABLET BY MOUTH EVERY DAY  . Brexpiprazole (REXULTI) 2 MG TABS Take 2 mg by mouth at bedtime.  . DULoxetine (CYMBALTA) 30 MG capsule TK 3 CS PO QAM  . gabapentin (NEURONTIN) 300 MG capsule Take 1 capsule (300 mg total) by mouth 3 (three) times daily.  [name] HIBICLENS 4 % external liquid U ON AFFECTED AREA BID  . hydrALAZINE (APRESOLINE) 25 MG tablet Take 1 tablet (25 mg total) by mouth 4 (four) times daily.  . isosorbide mononitrate (IMDUR) 30 MG 24 hr tablet TAKE 1 TABLET BY MOUTH EVERY DAY  . lisinopril (PRINIVIL,ZESTRIL) 40 MG tablet Take 1 tablet (40 mg total) by mouth daily.  . metoprolol (LOPRESSOR) 50 MG tablet TAKE 1 TABLET BY MOUTH TWICE DAILY  . nitroGLYCERIN (NITROSTAT) 0.3 MG SL tablet Place 1 tablet (0.3 mg total) under the tongue every 5 (five) minutes as needed for chest pain.  [name] omeprazole (PRILOSEC) 20 MG capsule Take 1 capsule (20 mg total) by mouth daily.  . OXYGEN 2 L  by Does not apply route at bedtime.  . traZODone (DESYREL) 150 MG tablet TK 2 TS PO QHS   No current facility-administered medications on file prior to visit.    Review of Systems  Constitutional: Negative for fever, chills and malaise/fatigue.  Eyes: Negative for visual disturbance.  Respiratory: Negative for chest tightness, shortness of breath and wheezing.   Cardiovascular: Negative for chest pain and palpitations.  Gastrointestinal: Negative.   Endocrine: Negative for cold intolerance, heat intolerance, polydipsia, polyphagia and polyuria.  Musculoskeletal: Positive for  arthralgias (R sided due to old stroke.) and gait problem (residual from stroke). Negative for neck pain.  Neurological: Negative for light-headedness, numbness and headaches.  Psychiatric/Behavioral: Negative.    Per HPI unless specifically indicated above     Objective:    BP 149/92 mmHg  Pulse 91  Temp(Src) 97.6 F (36.4 C) (Oral)  Resp 16  Ht '5\' 10"'$  (1.778 m)  Wt 185 lb 9.6 oz (84.188 kg)  BMI 26.63 kg/m2  Wt Readings from Last 3 Encounters:  08/28/15 185 lb 9.6 oz (84.188 kg)  07/08/15 185 lb 3.2 oz (84.006 kg)  04/02/15 180 lb (81.647 kg)    Physical Exam  Constitutional: He is oriented to person, place, and time. He appears well-developed and well-nourished. No distress.  HENT:  Head: Normocephalic and atraumatic.  Neck: Normal range of motion. Neck supple.  Musculoskeletal:       Right shoulder: He exhibits decreased range of motion. He exhibits no tenderness, no swelling and no crepitus.  Neurological: He is alert and oriented to person, place, and time. He exhibits abnormal muscle tone (R sideded weakness due to stroke residual). He displays a negative Romberg sign. Gait (due to stroke residual.) abnormal. Coordination normal.  Skin: Skin is warm and dry. He is not diaphoretic. No erythema. No pallor.  Psychiatric: He has a normal mood and affect. His behavior is normal. Judgment and thought content normal.   Results for orders placed or performed in visit on 08/08/15  Cologuard  Result Value Ref Range   Cologuard Negative       Assessment & Plan:   Problem List Items Addressed This Visit      Cardiovascular and Mediastinum   HTN (hypertension)    Uncontrolled today in the office. Likely 2/2 recent cocaine use. Have discussed risks with patien. Restart HCTZ at low dose. Will monitor BMET next visit.  Continue current regiment of ACE, BB, CCB, and hydralazine.  ACE for renal protection.  RTC 1 mos.       Relevant Medications   lisinopril (PRINIVIL,ZESTRIL)  30 MG tablet   hydrochlorothiazide (HYDRODIURIL) 12.5 MG tablet     Nervous and Auditory   CVA, old, hemiparesis (HCC)    R sided weakness and hemiplegia. OT requesting special sling to faciliate with movement. Ordered today.  Continue home OT/PT.       Relevant Medications   Elastic Bandages & Supports (ADJUSTABLE ARM SLING) MISC     Other   Cocaine abuse    Pt denies recent cocaine abuse. Pt is aware this will prevent him from receiving pain medication. Counseled pt to enter outpatient substance abuse counseling at [facility].       Chronic pain - Primary    Pt requesting pain meds today. Left prior to opioid agreement being completed. Is aware he will need to return in 1 week to sign agreement to obtain pain medication. Urine drug screen done today.  Pt is awaiting chronic pain consult on  09/30/2014.       Relevant Orders   ToxASSURE Select 13 (MW), Urine    Other Visit Diagnoses    Opioid use agreement exists        Pt left before agreement could be completed. He is aware he will need to come back for a follow-up visit to sign opioid agreement. No narcotics today. He did consent to urine drug screen today.     Relevant Orders    ToxASSURE Select 13 (MW), Urine       Meds ordered this encounter  Medications  . lisinopril (PRINIVIL,ZESTRIL) 30 MG tablet    Sig:   . Elastic Bandages & Supports (ADJUSTABLE ARM SLING) MISC    Sig: 1 each by Does not apply route daily. Apply to R arm.    Dispense:  1 each    Refill:  0    Please dispense Roylan Hemi Arm Slin    Order Specific Question:  Supervising Provider    Answer:  Arlis Porta [phone]  . hydrochlorothiazide (HYDRODIURIL) 12.5 MG tablet    Sig: Take 1 tablet (12.5 mg total) by mouth daily.    Dispense:  90 tablet    Refill:  3    Order Specific Question:  Supervising Provider    Answer:  Arlis Porta [[id number]]      Follow up plan: Return in about 4 weeks (around 09/25/2015) for BP.

## 2015-08-28 NOTE — Assessment & Plan Note (Signed)
R sided weakness and hemiplegia. OT requesting special sling to faciliate with movement. Ordered today.  Continue home OT/PT.

## 2015-08-28 NOTE — Assessment & Plan Note (Signed)
Pt denies recent cocaine abuse. Pt is aware this will prevent him from receiving pain medication. Counseled pt to enter outpatient substance abuse counseling at [facility].

## 2015-09-03 LAB — TOXASSURE SELECT 13 (MW), URINE: PDF: 0

## 2015-09-04 ENCOUNTER — Ambulatory Visit (INDEPENDENT_AMBULATORY_CARE_PROVIDER_SITE_OTHER): Payer: Medicare Other | Admitting: Family Medicine

## 2015-09-04 VITALS — BP 140/74 | HR 92 | Temp 97.5°F | Resp 16 | Ht 70.0 in | Wt 181.6 lb

## 2015-09-04 DIAGNOSIS — F1410 Cocaine abuse, uncomplicated: Secondary | ICD-10-CM

## 2015-09-04 DIAGNOSIS — E785 Hyperlipidemia, unspecified: Secondary | ICD-10-CM | POA: Diagnosis not present

## 2015-09-04 DIAGNOSIS — G8929 Other chronic pain: Secondary | ICD-10-CM | POA: Diagnosis not present

## 2015-09-04 DIAGNOSIS — I129 Hypertensive chronic kidney disease with stage 1 through stage 4 chronic kidney disease, or unspecified chronic kidney disease: Secondary | ICD-10-CM | POA: Diagnosis not present

## 2015-09-04 DIAGNOSIS — N182 Chronic kidney disease, stage 2 (mild): Secondary | ICD-10-CM

## 2015-09-04 MED ORDER — ACETAMINOPHEN 325 MG PO TABS: 650.0000 mg | ORAL_TABLET | Freq: Three times a day (TID) | ORAL | Status: DC

## 2015-09-04 MED ORDER — IBUPROFEN 400 MG PO TABS: 400.0000 mg | ORAL_TABLET | Freq: Two times a day (BID) | ORAL | Status: DC

## 2015-09-04 MED ORDER — GABAPENTIN 300 MG PO CAPS: ORAL_CAPSULE | ORAL | Status: DC

## 2015-09-04 NOTE — Assessment & Plan Note (Signed)
BP is controlled with addition of diuretic. Check CMP for sodium levels.  Continue to monitor BP at home.

## 2015-09-04 NOTE — Patient Instructions (Signed)
Your goal blood pressure is 140/90. Work on low salt/sodium diet - goal <1.5gm (1,'500mg'$ ) per day. Eat a diet high in fruits/vegetables and whole grains.  Look into mediterranean and DASH diet. Goal activity is 151mn/wk of moderate intensity exercise.  This can be split into 30 minute chunks.  If you are not at this level, you can start with smaller 10-15 min increments and slowly build up activity. Look at [facility] for more resources   Please seek immediate medical attention at ER or Urgent Care if you develop: Chest pain, pressure or tightness. Shortness of breath accompanied by nausea or diaphoresis Visual changes Numbness or tingling on one side of the body Facial droop Altered mental status Or any concerning symptoms.   Please consider doing [facility]'s outpatient substance abuse program. For your continued health and treatment of your pain you need to stop doing crack/cocaine.

## 2015-09-04 NOTE — Assessment & Plan Note (Signed)
Pt positive for cocaine on urine drug screen. Have discussed the risk of continued cocaine use with patient in depth. Strongly recommend outpatient substance abuse treatment at [facility]- pt has declined.  Pt is aware he will not get narcotics from this office with positive cocaine use. I have cautioned patient that chronic pain will not give him narcotics with cocaine use.

## 2015-09-04 NOTE — Assessment & Plan Note (Signed)
Pt referred to [facility] pain management. Appt on 2/22. Have increased gabapentin to 300 AM and afternoon and 600 at night to help with pain. Sparing use of ibuprofen due to CVA history. Encouraged tylenol use TID for pain.  No narcotics due to ongoing cocaine abuse.

## 2015-09-04 NOTE — Progress Notes (Signed)
Subjective:    Patient ID: David Hall., male    DOB: 1956/07/24, 60 y.o.   MRN: [id number]  HPI: David Hall. is a 60 y.o. male presenting on 09/04/2015 for Hypertension   HPI  Pt presents for hypertension follow-up.  He was also hoping to get pain medication for his chronic pain. He has a history of crack/cocaine use.  He was positive for cocaine on his urine screen. Per the patient last use was 2 weeks ago- crack use. Has been using once per month.   Hypertension: No chest pain, no shortenss of breath from baseline. No headaches.   Pain: R sided pain- shoulder and leg.  Pain is described as dull ache, sharp stabbing, electric tingle.  Most painful when moving. Taking Gabapentin '300mg'$  TID for pain.   Past Medical History  Diagnosis Date  . Hypertension   . GERD (gastroesophageal reflux disease)   . Bipolar disorder ([place])   . Shortness of breath   . Insomnia, controlled   . Oxygen deficiency   . Hyperlipidemia   . COPD (chronic obstructive pulmonary disease) ([place])     on home oxygen.   . Substance abuse 2016    cocaine use.  . Stroke ([place])     R sided hemiplegia  . Obesity     Current Outpatient Prescriptions on File Prior to Visit  Medication Sig  . ADVAIR DISKUS 250-50 MCG/DOSE AEPB INHALE 1 PUFF PO BID  . albuterol (VENTOLIN HFA) 108 (90 BASE) MCG/ACT inhaler Inhale 2 puffs into the lungs 4 (four) times daily as needed.  [name] amLODipine (NORVASC) 10 MG tablet Take 1 tablet (10 mg total) by mouth daily.  [name] aspirin 325 MG EC tablet Take 1 tablet (325 mg total) by mouth daily.  [name] atorvastatin (LIPITOR) 40 MG tablet TAKE 1 TABLET BY MOUTH EVERY DAY  . Brexpiprazole (REXULTI) 2 MG TABS Take 2 mg by mouth at bedtime.  . DULoxetine (CYMBALTA) 30 MG capsule TK 3 CS PO QAM  . Elastic Bandages & Supports (ADJUSTABLE ARM SLING) MISC 1 each by Does not apply route daily. Apply to R arm.  [name] HIBICLENS 4 % external liquid U ON AFFECTED AREA BID  . hydrALAZINE  (APRESOLINE) 25 MG tablet Take 1 tablet (25 mg total) by mouth 4 (four) times daily.  . hydrochlorothiazide (HYDRODIURIL) 12.5 MG tablet Take 1 tablet (12.5 mg total) by mouth daily.  . isosorbide mononitrate (IMDUR) 30 MG 24 hr tablet TAKE 1 TABLET BY MOUTH EVERY DAY  . lisinopril (PRINIVIL,ZESTRIL) 30 MG tablet   . lisinopril (PRINIVIL,ZESTRIL) 40 MG tablet Take 1 tablet (40 mg total) by mouth daily.  . metoprolol (LOPRESSOR) 50 MG tablet TAKE 1 TABLET BY MOUTH TWICE DAILY  . nitroGLYCERIN (NITROSTAT) 0.3 MG SL tablet Place 1 tablet (0.3 mg total) under the tongue every 5 (five) minutes as needed for chest pain.  [name] omeprazole (PRILOSEC) 20 MG capsule Take 1 capsule (20 mg total) by mouth daily.  . OXYGEN 2 L by Does not apply route at bedtime.  . traZODone (DESYREL) 150 MG tablet TK 2 TS PO QHS   No current facility-administered medications on file prior to visit.    Review of Systems  Constitutional: Negative for fever and chills.  HENT: Negative.   Respiratory: Negative for chest tightness, shortness of breath and wheezing.   Cardiovascular: Negative for chest pain, palpitations and leg swelling.  Gastrointestinal: Negative for nausea, vomiting and abdominal pain.  Endocrine: Negative.  Genitourinary: Negative for dysuria, urgency, discharge, penile pain and testicular pain.  Musculoskeletal: Positive for back pain and arthralgias. Negative for joint swelling.  Skin: Negative.   Neurological: Negative for dizziness, weakness, numbness and headaches.  Psychiatric/Behavioral: Negative for sleep disturbance and dysphoric mood.   Per HPI unless specifically indicated above     Objective:    BP 140/74 mmHg  Pulse 92  Temp(Src) 97.5 F (36.4 C) (Oral)  Resp 16  Ht '5\' 10"'$  (1.778 m)  Wt 181 lb 9.6 oz (82.373 kg)  BMI 26.06 kg/m2  Wt Readings from Last 3 Encounters:  09/04/15 181 lb 9.6 oz (82.373 kg)  08/28/15 185 lb 9.6 oz (84.188 kg)  07/08/15 185 lb 3.2 oz (84.006 kg)      Physical Exam  Constitutional: He is oriented to person, place, and time. He appears well-developed and well-nourished. No distress.  HENT:  Head: Normocephalic and atraumatic.  Neck: Neck supple. No thyromegaly present.  Cardiovascular: Normal rate, regular rhythm and normal heart sounds.  Exam reveals no gallop and no friction rub.   No murmur heard. Pulmonary/Chest: Effort normal and breath sounds normal. He has no wheezes.  Abdominal: Soft. Bowel sounds are normal. He exhibits no distension. There is no tenderness. There is no rebound.  Musculoskeletal: Normal range of motion. He exhibits no edema or tenderness.  Neurological: He is alert and oriented to person, place, and time. He has normal reflexes. No sensory deficit. He exhibits abnormal muscle tone (R sided weakness RUE, RLE.).  R sided hemiplegia due to stroke.    Skin: Skin is warm and dry. No rash noted. No erythema.  Psychiatric: He has a normal mood and affect. His behavior is normal. Thought content normal.   Results for orders placed or performed in visit on 08/28/15  ToxASSURE Select 37 (MW), Urine  Result Value Ref Range   Report Summary FINAL    PDF .       Assessment & Plan:   Problem List Items Addressed This Visit      Cardiovascular and Mediastinum   Benign hypertension with CKD (chronic kidney disease), stage II    BP is controlled with addition of diuretic. Check CMP for sodium levels.  Continue to monitor BP at home.        Relevant Orders   Comprehensive metabolic panel     Other   Dyslipidemia    Check lipid panel.       Relevant Orders   Lipid Profile   Cocaine abuse    Pt positive for cocaine on urine drug screen. Have discussed the risk of continued cocaine use with patient in depth. Strongly recommend outpatient substance abuse treatment at [facility]- pt has declined.  Pt is aware he will not get narcotics from this office with positive cocaine use. I have cautioned patient that chronic pain  will not give him narcotics with cocaine use.       Chronic pain - Primary    Pt referred to [facility] pain management. Appt on 2/22. Have increased gabapentin to 300 AM and afternoon and 600 at night to help with pain. Sparing use of ibuprofen due to CVA history. Encouraged tylenol use TID for pain.  No narcotics due to ongoing cocaine abuse.       Relevant Medications   trazodone (DESYREL) 300 MG tablet   gabapentin (NEURONTIN) 300 MG capsule   ibuprofen (ADVIL,MOTRIN) 400 MG tablet   acetaminophen (TYLENOL) 325 MG tablet      Meds ordered  this encounter  Medications  . trazodone (DESYREL) 300 MG tablet    Sig:   . gabapentin (NEURONTIN) 300 MG capsule    Sig: Take 1 capsule in the AM, 1 capsule in the afternoon and 2 capsules at night.    Dispense:  360 capsule    Refill:  3    Order Specific Question:  Supervising Provider    Answer:  Arlis Porta [phone]  . ibuprofen (ADVIL,MOTRIN) 400 MG tablet    Sig: Take 1 tablet (400 mg total) by mouth 2 (two) times daily.    Dispense:  30 tablet    Refill:  1    Order Specific Question:  Supervising Provider    Answer:  Arlis Porta [phone]  . acetaminophen (TYLENOL) 325 MG tablet    Sig: Take 2 tablets (650 mg total) by mouth every 8 (eight) hours.    Order Specific Question:  Supervising Provider    Answer:  Arlis Porta [[id number]]      Follow up plan: Return in about 4 weeks (around 10/02/2015).

## 2015-09-04 NOTE — Assessment & Plan Note (Signed)
Check lipid panel  

## 2015-09-26 ENCOUNTER — Ambulatory Visit: Payer: Self-pay | Admitting: Family Medicine

## 2015-09-26 ENCOUNTER — Ambulatory Visit (INDEPENDENT_AMBULATORY_CARE_PROVIDER_SITE_OTHER): Payer: Medicare Other | Admitting: Family Medicine

## 2015-09-26 ENCOUNTER — Other Ambulatory Visit: Payer: Self-pay | Admitting: Family Medicine

## 2015-09-26 VITALS — BP 135/84 | HR 54 | Temp 97.6°F | Resp 16 | Ht 70.0 in | Wt 191.0 lb

## 2015-09-26 DIAGNOSIS — J069 Acute upper respiratory infection, unspecified: Secondary | ICD-10-CM | POA: Diagnosis not present

## 2015-09-26 DIAGNOSIS — N182 Chronic kidney disease, stage 2 (mild): Secondary | ICD-10-CM

## 2015-09-26 DIAGNOSIS — I129 Hypertensive chronic kidney disease with stage 1 through stage 4 chronic kidney disease, or unspecified chronic kidney disease: Secondary | ICD-10-CM | POA: Diagnosis not present

## 2015-09-26 DIAGNOSIS — I10 Essential (primary) hypertension: Secondary | ICD-10-CM | POA: Diagnosis not present

## 2015-09-26 DIAGNOSIS — J9612 Chronic respiratory failure with hypercapnia: Secondary | ICD-10-CM

## 2015-09-26 DIAGNOSIS — J441 Chronic obstructive pulmonary disease with (acute) exacerbation: Secondary | ICD-10-CM | POA: Diagnosis not present

## 2015-09-26 MED ORDER — DM-GUAIFENESIN ER 30-600 MG PO TB12: 1.0000 | ORAL_TABLET | Freq: Two times a day (BID) | ORAL | Status: DC

## 2015-09-26 MED ORDER — PREDNISONE 20 MG PO TABS: 40.0000 mg | ORAL_TABLET | Freq: Every day | ORAL | Status: DC

## 2015-09-26 MED ORDER — METOPROLOL TARTRATE 50 MG PO TABS: 25.0000 mg | ORAL_TABLET | Freq: Two times a day (BID) | ORAL | Status: DC

## 2015-09-26 MED ORDER — AMOXICILLIN-POT CLAVULANATE 875-125 MG PO TABS: 1.0000 | ORAL_TABLET | Freq: Two times a day (BID) | ORAL | Status: DC

## 2015-09-26 MED ORDER — PREDNISONE 20 MG PO TABS: 20.0000 mg | ORAL_TABLET | Freq: Every day | ORAL | Status: DC

## 2015-09-26 MED ORDER — BENZONATATE 100 MG PO CAPS
100.0000 mg | ORAL_CAPSULE | Freq: Three times a day (TID) | ORAL | Status: DC | PRN
Start: 2015-09-26 — End: 2015-10-27

## 2015-09-26 MED ORDER — ASPIRIN EC 81 MG PO TBEC: 81.0000 mg | DELAYED_RELEASE_TABLET | Freq: Every day | ORAL | Status: DC

## 2015-09-26 MED ORDER — ALBUTEROL SULFATE (2.5 MG/3ML) 0.083% IN NEBU: 2.5000 mg | INHALATION_SOLUTION | Freq: Four times a day (QID) | RESPIRATORY_TRACT | Status: DC | PRN

## 2015-09-26 NOTE — Progress Notes (Signed)
Subjective:    Patient ID: David Hall., male    DOB: [birth year], 60 y.o.   MRN: [id number]  HPI: David Hall. is a 60 y.o. male presenting on 09/26/2015 for Hypertension   HPI  Pt presents hypertension follow-up. BP is doing well. No chest pain, visual changes, or HA. BP avg 130/80 at home. Taking all medications as directed.   COPD- Congestion and SOB x 1 week. Increasing dyspnea. Non-productive cough. No fevers. Nasal congestion.  Sinus pain or pressure. Mild chest tightness. Using rescue inhaler 4-5 times day. Smoking 1 cigarette per day.   Past Medical History  Diagnosis Date  . Hypertension   . GERD (gastroesophageal reflux disease)   . Bipolar disorder ([place])   . Shortness of breath   . Insomnia, controlled   . Oxygen deficiency   . Hyperlipidemia   . COPD (chronic obstructive pulmonary disease) ([place])     on home oxygen.   . Substance abuse 2016    cocaine use.  . Stroke ([place])     R sided hemiplegia  . Obesity     Current Outpatient Prescriptions on File Prior to Visit  Medication Sig  . acetaminophen (TYLENOL) 325 MG tablet Take 2 tablets (650 mg total) by mouth every 8 (eight) hours.  [name] ADVAIR DISKUS 250-50 MCG/DOSE AEPB INHALE 1 PUFF PO BID  . albuterol (VENTOLIN HFA) 108 (90 BASE) MCG/ACT inhaler Inhale 2 puffs into the lungs 4 (four) times daily as needed.  [name] amLODipine (NORVASC) 10 MG tablet Take 1 tablet (10 mg total) by mouth daily.  [name] atorvastatin (LIPITOR) 40 MG tablet TAKE 1 TABLET BY MOUTH EVERY DAY  . Brexpiprazole (REXULTI) 2 MG TABS Take 2 mg by mouth at bedtime.  . DULoxetine (CYMBALTA) 30 MG capsule TK 3 CS PO QAM  . Elastic Bandages & Supports (ADJUSTABLE ARM SLING) MISC 1 each by Does not apply route daily. Apply to R arm.  . gabapentin (NEURONTIN) 300 MG capsule Take 1 capsule in the AM, 1 capsule in the afternoon and 2 capsules at night.  . hydrALAZINE (APRESOLINE) 25 MG tablet Take 1 tablet (25 mg total) by mouth 4 (four) times  daily.  . hydrochlorothiazide (HYDRODIURIL) 12.5 MG tablet Take 1 tablet (12.5 mg total) by mouth daily.  [name] ibuprofen (ADVIL,MOTRIN) 400 MG tablet Take 1 tablet (400 mg total) by mouth 2 (two) times daily.  . isosorbide mononitrate (IMDUR) 30 MG 24 hr tablet TAKE 1 TABLET BY MOUTH EVERY DAY  . lisinopril (PRINIVIL,ZESTRIL) 30 MG tablet   . lisinopril (PRINIVIL,ZESTRIL) 40 MG tablet Take 1 tablet (40 mg total) by mouth daily.  . nitroGLYCERIN (NITROSTAT) 0.3 MG SL tablet Place 1 tablet (0.3 mg total) under the tongue every 5 (five) minutes as needed for chest pain.  [name] omeprazole (PRILOSEC) 20 MG capsule Take 1 capsule (20 mg total) by mouth daily.  . OXYGEN 2 L by Does not apply route at bedtime.  . traZODone (DESYREL) 150 MG tablet TK 2 TS PO QHS  . trazodone (DESYREL) 300 MG tablet    No current facility-administered medications on file prior to visit.    Review of Systems  Constitutional: Negative for fever and chills.  HENT: Positive for congestion, rhinorrhea, sinus pressure and sneezing. Negative for ear pain and postnasal drip.   Respiratory: Positive for cough, chest tightness, shortness of breath and wheezing.   Cardiovascular: Negative for chest pain, palpitations and leg swelling.  Musculoskeletal: Negative for neck pain and  neck stiffness.  Skin: Negative.   Allergic/Immunologic: Positive for environmental allergies.  Neurological: Negative for syncope, numbness and headaches.   Per HPI unless specifically indicated above     Objective:    BP 135/84 mmHg  Pulse 54  Temp(Src) 97.6 F (36.4 C) (Oral)  Resp 16  Ht '5\' 10"'$  (1.778 m)  Wt 191 lb (86.637 kg)  BMI 27.41 kg/m2  SpO2 96%  Wt Readings from Last 3 Encounters:  09/26/15 191 lb (86.637 kg)  09/04/15 181 lb 9.6 oz (82.373 kg)  08/28/15 185 lb 9.6 oz (84.188 kg)    Physical Exam  Constitutional: He appears well-developed and well-nourished. No distress.  HENT:  Head: Normocephalic.  Right Ear: Hearing and  tympanic membrane normal. Tympanic membrane is not erythematous and not bulging.  Left Ear: Hearing and tympanic membrane normal. Tympanic membrane is not erythematous and not bulging.  Nose: Mucosal edema and rhinorrhea present. No sinus tenderness or nasal septal hematoma. Right sinus exhibits frontal sinus tenderness. Right sinus exhibits no maxillary sinus tenderness. Left sinus exhibits frontal sinus tenderness. Left sinus exhibits no maxillary sinus tenderness.  Mouth/Throat: Uvula is midline, oropharynx is clear and moist and mucous membranes are normal. No uvula swelling. No posterior oropharyngeal edema or posterior oropharyngeal erythema.  Neck: Neck supple. No Brudzinski's sign and no Kernig's sign noted.  Cardiovascular: Normal rate, regular rhythm and normal heart sounds.   Pulmonary/Chest: No accessory muscle usage. No tachypnea and no bradypnea. No respiratory distress. He has wheezes in the right middle field, the right lower field and the left lower field. He has no rhonchi. He has no rales. Chest wall is not dull to percussion. He exhibits no tenderness and no bony tenderness.  Lymphadenopathy:    He has no cervical adenopathy.   Results for orders placed or performed in visit on 08/28/15  ToxASSU[employer] Select 61 (MW), Urine  Result Value Ref Range   Report Summary FINAL    PDF .       Assessment & Plan:   Problem List Items Addressed This Visit      Cardiovascular and Mediastinum   HTN (hypertension)   Relevant Medications   aspirin EC 81 MG tablet   metoprolol (LOPRESSOR) 50 MG tablet   Benign hypertension with CKD (chronic kidney disease), stage II    Decrease metoprolol to '50mg'$  daily due to low HR. Baseline HR is 70-80. Recheck in 2 weeks for BP. Get CMP to check potassium due to addition of diuretic for additional BP control at last visit.       Relevant Medications   aspirin EC 81 MG tablet   metoprolol (LOPRESSOR) 50 MG tablet     Respiratory   Chronic  respiratory failure (HCC)    Add PRN nebulizer machine. Continue home O2 at night.       COPD (chronic obstructive pulmonary disease) with emphysema (HCC) - Primary    Treat for COPD exacerbation with URI today. Prednisone '40mg'$  daily x 5 days, augmentin BID to cover for sinus infection. Mucinex DM for cough and congestion.  Start nebulizer treatments to ease breathing- DME machine ordered and [facility] contacted.  Continue PRN inhaler and advair.  Consider spirva if inhaler use is still frequent when back to baseline.   Alarm symptoms reviewed. Recheck in 2 weeks.       Relevant Medications   dextromethorphan-guaiFENesin (MUCINEX DM) 30-600 MG 12hr tablet   amoxicillin-clavulanate (AUGMENTIN) 875-125 MG tablet   benzonatate (TESSALON) 100 MG capsule  predniSONE (DELTASONE) 20 MG tablet    Other Visit Diagnoses    Upper respiratory infection           Meds ordered this encounter  Medications  . dextromethorphan-guaiFENesin (MUCINEX DM) 30-600 MG 12hr tablet    Sig: Take 1 tablet by mouth 2 (two) times daily.    Dispense:  20 tablet    Refill:  0    Order Specific Question:  Supervising Provider    Answer:  Arlis Porta [phone]  . DISCONTD: predniSONE (DELTASONE) 20 MG tablet    Sig: Take 1 tablet (20 mg total) by mouth daily with breakfast.    Dispense:  10 tablet    Refill:  0    Order Specific Question:  Supervising Provider    Answer:  Arlis Porta [phone]  . amoxicillin-clavulanate (AUGMENTIN) 875-125 MG tablet    Sig: Take 1 tablet by mouth 2 (two) times daily.    Dispense:  14 tablet    Refill:  0    Order Specific Question:  Supervising Provider    Answer:  Arlis Porta [phone]  . benzonatate (TESSALON) 100 MG capsule    Sig: Take 1 capsule (100 mg total) by mouth 3 (three) times daily as needed.    Dispense:  30 capsule    Refill:  0    Order Specific Question:  Supervising Provider    Answer:  Arlis Porta [[id number]]  . DISCONTD:  albuterol (PROVENTIL) (2.5 MG/3ML) 0.083% nebulizer solution    Sig: Take 3 mLs (2.5 mg total) by nebulization every 6 (six) hours as needed for wheezing or shortness of breath.    Dispense:  150 mL    Refill:  1    Order Specific Question:  Supervising Provider    Answer:  Arlis Porta [[id number]]  . predniSONE (DELTASONE) 20 MG tablet    Sig: Take 2 tablets (40 mg total) by mouth daily with breakfast.    Dispense:  10 tablet    Refill:  0    Order Specific Question:  Supervising Provider    Answer:  Arlis Porta [phone]  . aspirin EC 81 MG tablet    Sig: Take 1 tablet (81 mg total) by mouth daily.    Dispense:  90 tablet    Refill:  3    Order Specific Question:  Supervising Provider    Answer:  Arlis Porta [phone]  . metoprolol (LOPRESSOR) 50 MG tablet    Sig: Take 0.5 tablets (25 mg total) by mouth 2 (two) times daily.    Dispense:  180 tablet    Refill:  0    **Patient requests 90 days supply**    Order Specific Question:  Supervising Provider    Answer:  Arlis Porta [[id number]]      Follow up plan: Return in about 2 weeks (around 10/10/2015), or if symptoms worsen or fail to improve, for Keep previously scheduled appt.[name]

## 2015-09-26 NOTE — Assessment & Plan Note (Signed)
Add PRN nebulizer machine. Continue home O2 at night.

## 2015-09-26 NOTE — Assessment & Plan Note (Signed)
Treat for COPD exacerbation with URI today. Prednisone '40mg'$  daily x 5 days, augmentin BID to cover for sinus infection. Mucinex DM for cough and congestion.  Start nebulizer treatments to ease breathing- DME machine ordered and [facility] contacted.  Continue PRN inhaler and advair.  Consider spirva if inhaler use is still frequent when back to baseline.   Alarm symptoms reviewed. Recheck in 2 weeks.

## 2015-09-26 NOTE — Patient Instructions (Addendum)
Take 1/2 tablet of Metoprolol twice daily due to low heart rate.   Take Prednisone 2 tablets in the AM for 5 days. Take augmentin twice daily for 7 days. Mucinex DM and Tessalon perles for cough as needed.   You can use supportive care at home to help with your symptoms. I have sent Mucinex DM to your pharmacy to help break up the congestion and soothe your cough. You can takes this twice daily.  I have also sent tesslon perles to your pharmacy to help with the cough- you can take these 3 times daily as needed. Honey is a natural cough suppressant- so add it to your tea in the morning.  If you have a humidifer, set that up in your bedroom at night.    Please seek immediate medical attention at ER or Urgent Care if you develop: Chest pain, pressure or tightness. Shortness of breath accompanied by nausea or diaphoresis Visual changes Numbness or tingling on one side of the body Facial droop Altered mental status Or any concerning symptoms.  Please seek immediate medical attention if you develop shortness of breath not relieve by inhaler, chest pain/tightness, fever > 103 F or other concerning symptoms.

## 2015-09-26 NOTE — Assessment & Plan Note (Addendum)
Decrease metoprolol to '50mg'$  daily due to low HR. Baseline HR is 70-80. Recheck in 2 weeks for BP. Get CMP to check potassium due to addition of diuretic for additional BP control at last visit.

## 2015-09-30 ENCOUNTER — Other Ambulatory Visit: Payer: Self-pay | Admitting: Family Medicine

## 2015-10-01 ENCOUNTER — Ambulatory Visit: Payer: Medicare Other | Admitting: Pain Medicine

## 2015-10-06 DIAGNOSIS — R413 Other amnesia: Secondary | ICD-10-CM | POA: Diagnosis not present

## 2015-10-06 DIAGNOSIS — R6889 Other general symptoms and signs: Secondary | ICD-10-CM | POA: Diagnosis not present

## 2015-10-09 ENCOUNTER — Ambulatory Visit: Payer: Self-pay | Admitting: Family Medicine

## 2015-10-10 ENCOUNTER — Encounter: Payer: Self-pay | Admitting: Family Medicine

## 2015-10-10 ENCOUNTER — Ambulatory Visit: Payer: Self-pay | Admitting: Family Medicine

## 2015-10-16 DIAGNOSIS — R413 Other amnesia: Secondary | ICD-10-CM | POA: Diagnosis not present

## 2015-10-16 DIAGNOSIS — R569 Unspecified convulsions: Secondary | ICD-10-CM | POA: Diagnosis not present

## 2015-10-22 ENCOUNTER — Other Ambulatory Visit: Payer: Self-pay | Admitting: Family Medicine

## 2015-10-27 ENCOUNTER — Ambulatory Visit: Payer: Medicare Other | Attending: Pain Medicine | Admitting: Pain Medicine

## 2015-10-27 ENCOUNTER — Encounter: Payer: Self-pay | Admitting: Pain Medicine

## 2015-10-27 VITALS — BP 142/75 | HR 60 | Temp 97.6°F | Resp 16 | Ht 70.0 in | Wt 195.0 lb

## 2015-10-27 DIAGNOSIS — J449 Chronic obstructive pulmonary disease, unspecified: Secondary | ICD-10-CM | POA: Insufficient documentation

## 2015-10-27 DIAGNOSIS — J439 Emphysema, unspecified: Secondary | ICD-10-CM | POA: Diagnosis not present

## 2015-10-27 DIAGNOSIS — Z87891 Personal history of nicotine dependence: Secondary | ICD-10-CM | POA: Diagnosis not present

## 2015-10-27 DIAGNOSIS — R413 Other amnesia: Secondary | ICD-10-CM | POA: Insufficient documentation

## 2015-10-27 DIAGNOSIS — G8929 Other chronic pain: Secondary | ICD-10-CM

## 2015-10-27 DIAGNOSIS — I69359 Hemiplegia and hemiparesis following cerebral infarction affecting unspecified side: Secondary | ICD-10-CM

## 2015-10-27 DIAGNOSIS — R918 Other nonspecific abnormal finding of lung field: Secondary | ICD-10-CM | POA: Diagnosis not present

## 2015-10-27 DIAGNOSIS — M79603 Pain in arm, unspecified: Secondary | ICD-10-CM | POA: Diagnosis present

## 2015-10-27 DIAGNOSIS — R930 Abnormal findings on diagnostic imaging of skull and head, not elsewhere classified: Secondary | ICD-10-CM

## 2015-10-27 DIAGNOSIS — E669 Obesity, unspecified: Secondary | ICD-10-CM | POA: Diagnosis not present

## 2015-10-27 DIAGNOSIS — I129 Hypertensive chronic kidney disease with stage 1 through stage 4 chronic kidney disease, or unspecified chronic kidney disease: Secondary | ICD-10-CM | POA: Insufficient documentation

## 2015-10-27 DIAGNOSIS — G894 Chronic pain syndrome: Secondary | ICD-10-CM

## 2015-10-27 DIAGNOSIS — J9610 Chronic respiratory failure, unspecified whether with hypoxia or hypercapnia: Secondary | ICD-10-CM | POA: Insufficient documentation

## 2015-10-27 DIAGNOSIS — K219 Gastro-esophageal reflux disease without esophagitis: Secondary | ICD-10-CM | POA: Insufficient documentation

## 2015-10-27 DIAGNOSIS — G2581 Restless legs syndrome: Secondary | ICD-10-CM | POA: Diagnosis not present

## 2015-10-27 DIAGNOSIS — I69859 Hemiplegia and hemiparesis following other cerebrovascular disease affecting unspecified side: Secondary | ICD-10-CM | POA: Insufficient documentation

## 2015-10-27 DIAGNOSIS — Z9981 Dependence on supplemental oxygen: Secondary | ICD-10-CM | POA: Insufficient documentation

## 2015-10-27 DIAGNOSIS — F1410 Cocaine abuse, uncomplicated: Secondary | ICD-10-CM | POA: Diagnosis not present

## 2015-10-27 DIAGNOSIS — M179 Osteoarthritis of knee, unspecified: Secondary | ICD-10-CM | POA: Insufficient documentation

## 2015-10-27 DIAGNOSIS — M069 Rheumatoid arthritis, unspecified: Secondary | ICD-10-CM | POA: Insufficient documentation

## 2015-10-27 DIAGNOSIS — I10 Essential (primary) hypertension: Secondary | ICD-10-CM | POA: Diagnosis not present

## 2015-10-27 DIAGNOSIS — G890 Central pain syndrome: Secondary | ICD-10-CM | POA: Diagnosis not present

## 2015-10-27 DIAGNOSIS — F319 Bipolar disorder, unspecified: Secondary | ICD-10-CM | POA: Insufficient documentation

## 2015-10-27 DIAGNOSIS — S43006A Unspecified dislocation of unspecified shoulder joint, initial encounter: Secondary | ICD-10-CM | POA: Insufficient documentation

## 2015-10-27 DIAGNOSIS — M79606 Pain in leg, unspecified: Secondary | ICD-10-CM | POA: Diagnosis present

## 2015-10-27 DIAGNOSIS — X58XXXA Exposure to other specified factors, initial encounter: Secondary | ICD-10-CM | POA: Diagnosis not present

## 2015-10-27 DIAGNOSIS — E785 Hyperlipidemia, unspecified: Secondary | ICD-10-CM | POA: Diagnosis not present

## 2015-10-27 NOTE — Assessment & Plan Note (Signed)
The putamen is the outer most portion of the basal ganglia. These are a group of nuclei in the brain that are interconnected with the cerebral cortex, thalamus, and brain stem. The primary purpose of the corticospinal tract is for voluntary motor control of the body and limbs. However, connections to the somatosensory cortex suggest that the pyramidal tracts are also responsible for modulating sensory information from the body.

## 2015-10-27 NOTE — Assessment & Plan Note (Addendum)
The patient indicates that the pain on the right side of his body started at the same time as the hemiparesis, immediately after his stroke. This condition is purely neurogenic and neuropathic and therefore it is treated with membrane stabilizers. This condition does not respond well to the use of opioids and therefore they should be avoided. Recommended doses of gabapentin may be as high as 3600 mg per day.

## 2015-10-27 NOTE — Progress Notes (Signed)
Patient's Name: David Hall. MRN: [id number] DOB: 11-Jul-1956 DOS: 10/27/2015  Primary Reason(s) for Visit: Initial Patient Evaluation CC: Arm Pain and Leg Pain   HPI  [name] is a 60 y.o. year old, male patient, who comes today for an initial evaluation. He has CVA, old, hemiparesis ([place]); HTN (hypertension); Dyslipidemia; Bipolar disorder, unspecified ([place]); Bipolar 1 disorder, depressed ([place]); Lung nodule, multiple; Blood glucose elevated; Restless leg; Compulsive tobacco user syndrome; Arthritis of knee, degenerative; Benign hypertension with CKD (chronic kidney disease), stage II; Cocaine abuse; Chronic respiratory failure ([place]); Dislocated shoulder; Excessive falling; BP (high blood pressure); Lung mass; Cervical pain; Bursitis of elbow; Elbow pain; Paralytic stroke ([place]); COPD (chronic obstructive pulmonary disease) with emphysema ([place]); On home oxygen therapy; Chronic pain; Memory loss; Faintness; Cerebral artery occlusion with cerebral infarction ([place]); Amnesia; Chronic obstructive pulmonary disease ([place]); Central pain syndrome (CPS); Right 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 His primarily concern today is the Arm Pain and Leg Pain   The patient comes in today clinics today for chronic pain management evaluation. According to the patient everything started approximately 4 years ago immediately after he had a stroke. The patient indicates having pain in the right half of his body. This is known as a right hemialgia. Unfortunately, because this is secondary to stroke, this falls within the category of central pain syndromes. There is really not much that can be done for these patients in terms of interventional therapies or medication management. Typically the best way to manage this pain is through the use of membranes stabilizers such as gabapentin or Lyrica.  Currently the patient is on gabapentin 300 mg 4 times a day (1200 mg). This should slowly be increased to a dose between 800-900 mg by mouth 4 times a day, as tolerated. (3200-3600 mg/day). Care must be exercised when treating this condition with any opioid analgesic since this patient does have a prior history of substance use disorder. He has a history of cocaine abuse and compulsive tobacco use. I would recommend staying away from opioids. In any case, opioid analgesics are not effective in the treatment of central pain syndromes.  Reported Pain Score: 8  Reported level is inconsistent with clinical obrservations. Pain Type: Chronic pain Pain Location: Arm (right side of body) Pain Orientation: Right Pain Descriptors / Indicators: Throbbing, Sharp, Stabbing, Numbness Pain Frequency: Constant  Onset and Duration: Sudden, Date of onset: 4 years ago after a stroke and Present longer than 3 months Cause of pain: Stroke in the area of the left putamen and corticospinal tracts. Severity: No change since onset and NAS-11 now: 8/10 Timing: Not influenced by the time of the day Aggravating Factors: Not influenced by any particular movement or aggravating factors. Alleviating Factors: Not improved by any type of therapy. Associated Problems: Depression, Dizziness, Spasms, Pain that wakes patient up and Pain that does not allow patient to sleep Quality of Pain: Agonizing, Stabbing, Throbbing, Tingling and Uncomfortable Previous Examinations or Tests: MRI scan and Psychiatric evaluation Previous Treatments: Narcotic medications  Historic Controlled Substance Pharmacotherapy Review  Previously Prescribed Opioids:  Analgesic: Currently he is not taking any opioids, but at one point he took oxycodone IR 10 mg 4 times a day 5 days. MME/day: 0 mg/day Pharmacokinetics: N/A Pharmacodynamics: N/A Historical Background Evaluation: [place] PDMP: Five (5) year initial data search conducted. Historical  Hospital-associated UDS Results:   Lab Results  Component Value Date  THCU NEGATIVE 09/01/2014   PCPSCRNUR NEGATIVE 09/01/2014   MDMA NEGATIVE 09/01/2014   AMPHETMU NEGATIVE 09/01/2014   METHADONE NEGATIVE 09/01/2014   ETOH < 3 09/01/2014   Risk Assessment: Aberrant Behavior: use of illicit substances  Opioid Fatal Overdose Risk Factors: Concomitant use of Benzodiazepines, A history of substance abuse, Male gender, Caucasian and COPD or asthma Substance Use Disorder (SUD) Risk Level: Moderate to high Opioid Risk Tool (ORT) Score: Total Score: 3 Low Risk for SUD (Score <3) Depression Scale Score: PHQ-2: PHQ-2 Total Score: 0 No depression (0) PHQ-9: PHQ-9 Total Score: 0 No depression (0-4)  Pharmacologic Plan: Pending ordered tests and/or consults  Neuromodulation Therapy Review  Type: No neuromodulatory devices implanted Side-effects or Adverse reactions: No device reported Effectiveness: No device reported  Allergies  [name] is allergic to quetiapine; codeine; and seroquel.  Meds  The patient has a current medication list which includes the following prescription(s): advair diskus, albuterol, albuterol, amlodipine, aspirin, atorvastatin, brexpiprazole, chlorhexidine, dextromethorphan-guaifenesin, divalproex, duloxetine, adjustable arm sling, gabapentin, hydralazine, hydrochlorothiazide, ibuprofen, isosorbide mononitrate, lisinopril, metoprolol, nitroglycerin, omeprazole, oxygen-helium, and trazodone. Requested Prescriptions    No prescriptions requested or ordered in this encounter    ROS  Cardiovascular History: Daily Aspirin intake, Hypertension and Chest pain Pulmonary or Respiratory History: Lung problems, Emphysema, Shortness of breath and Smoker Neurological History: Stroke (Residual deficits or weakness: Right hemiparesis and hemialgia) Psychological-Psychiatric History: Psychiatric disorder, Depression and Suicidal ideations Gastrointestinal History: Negative  for peptic ulcer disease, hiatal hernia, GERD, IBS, hepatitis, cirrhosis or pancreatitis Genitourinary History: Negative for nephrolithiasis, hematuria, renal failure or chronic kidney disease Hematological History: Negative for anticoagulant therapy, anemia, bruising or bleeding easily, hemophilia, sickle cell disease or trait, thrombocytopenia or coagulupathies Endocrine History: Negative for diabetes or thyroid disease Rheumatologic History: Rheumatoid arthritis Musculoskeletal History: Negative for myasthenia gravis, muscular dystrophy, multiple sclerosis or malignant hyperthermia Work History: Legally disabled  [place]  Medical:  Mr. Bekker  has a past medical history of Hypertension; GERD (gastroesophageal reflux disease); Bipolar disorder ([place]); Shortness of breath; Insomnia, controlled; Oxygen deficiency; Hyperlipidemia; COPD (chronic obstructive pulmonary disease) ([place]); Substance abuse (2016); Stroke [facility]); and Obesity. Family: family history includes CAD in his mother; Hypertension in his mother; Lung disease in his father; Stroke in his mother. Surgical:  has past surgical history that includes Carpal tunnel release and Tonsillectomy. Tobacco:  reports that he has quit smoking. His smoking use included Cigarettes. He smoked 1.00 pack per day. He has never used smokeless tobacco. Alcohol:  reports that he does not drink alcohol. Drug:  reports that he does not use illicit drugs.  Physical Exam  Vitals:  Today's Vitals   10/27/15 1506 10/27/15 1508  BP:  142/75  Pulse: 60   Temp: 97.6 F (36.4 C)   Resp: 16   Height: '5\' 10"'$  (1.778 m)   Weight: 195 lb (88.451 kg)   SpO2: 94%   PainSc: 8  8   PainLoc: Arm     Calculated BMI: Body mass index is 27.98 kg/(m^2).     General appearance: alert, cooperative, appears stated age, no distress, slowed mentation and The patient appears to be on the day influence of some type of drug. Eyes: PERLA Respiratory: No evidence respiratory  distress, no audible rales or ronchi and no use of accessory muscles of respiration  Neurologic: Evidence of right hemiparesis and hemialgia.   Assessment  Primary Diagnosis & Pertinent Problem List: The primary encounter diagnosis was Central pain syndrome. Diagnoses of Hemiparesis following cerebrovascular accident (CVA) ([place]),  Chronic pain, Chronic pain syndrome, and Abnormal MRI of head were also pertinent to this visit.  Visit Diagnosis: 1. Central pain syndrome   2. Hemiparesis following cerebrovascular accident (CVA) ([place])   3. Chronic pain   4. Chronic pain syndrome   5. Abnormal MRI of head     Assessment: Central pain syndrome (CPS) The patient indicates that the pain on the right side of his body started at the same time as the hemiparesis, immediately after his stroke. This condition is purely neurogenic and neuropathic and therefore it is treated with membrane stabilizers. This condition does not respond well to the use of opioids and therefore they should be avoided. Recommended doses of gabapentin may be as high as 3600 mg per day.  Abnormal MRI of head The putamen is the outer most portion of the basal ganglia. These are a group of nuclei in the brain that are interconnected with the cerebral cortex, thalamus, and brain stem. The primary purpose of the corticospinal tract is for voluntary motor control of the body and limbs. However, connections to the somatosensory cortex suggest that the pyramidal tracts are also responsible for modulating sensory information from the body.    Plan of Care  Note: As per protocol, today's visit has been an evaluation only. We have not taken over the patient's controlled substance management. At this point, since there is nothing that I can offer this patient, we have not provided him with a return appointment.  Recommendations: Currently the patient is on gabapentin 300 mg 4 times a day (1200 mg). This should slowly be increased to a dose  between 800-900 mg by mouth 4 times a day, as tolerated. (3200-3600 mg/day). Care must be exercised when treating this condition with any opioid analgesic since this patient does have a prior history of substance use disorder. He has a history of cocaine abuse and compulsive tobacco use. I would recommend staying away from opioids. In any case, opioid analgesics are not effective in the treatment of central pain syndromes.  Pharmacotherapy (Medications Ordered): No orders of the defined types were placed in this encounter.    Lab-work & Procedure Ordered: No orders of the defined types were placed in this encounter.    Imaging Ordered: None  Interventional Therapies: Scheduled: None. This type of condition does not respond to interventional therapies. PRN Procedures: None.    Referral(s) or Consult(s): Neurosurgery consult at either [facility] or [facility] neurosurgery for deep brain stimulation evaluation.  Medications administered during this visit: Mr. Mitcham had no medications administered during this visit.  Prescriptions ordered during this visit: New Prescriptions   No medications on file    No future appointments.  Primary Care Physician: Leata Mouse, NP Location: [facility] Facility Note by: Kathlen Brunswick. Dossie Arbour, M.D, DABA, DABAPM, DABPM, DABIPP, FIPP

## 2015-10-27 NOTE — Progress Notes (Signed)
Safety precautions to be maintained throughout the outpatient stay will include: orient to surroundings, keep bed in low position, maintain call bell within reach at all times, provide assistance with transfer out of bed and ambulation.  Patient did not bring medications.  They brought a list that was not accurate and they did not seem sure of any of the medications.  Instructed patient and family member to bring all pill bottles to next appointment.

## 2015-10-27 NOTE — Patient Instructions (Signed)

## 2015-11-03 DIAGNOSIS — F411 Generalized anxiety disorder: Secondary | ICD-10-CM | POA: Diagnosis not present

## 2015-11-03 DIAGNOSIS — F410 Panic disorder [episodic paroxysmal anxiety] without agoraphobia: Secondary | ICD-10-CM | POA: Diagnosis not present

## 2015-11-03 DIAGNOSIS — F323 Major depressive disorder, single episode, severe with psychotic features: Secondary | ICD-10-CM | POA: Diagnosis not present

## 2015-11-27 ENCOUNTER — Other Ambulatory Visit: Payer: Self-pay | Admitting: Family Medicine

## 2015-12-08 ENCOUNTER — Other Ambulatory Visit: Payer: Self-pay | Admitting: Family Medicine

## 2015-12-10 ENCOUNTER — Telehealth: Payer: Self-pay | Admitting: *Deleted

## 2015-12-10 ENCOUNTER — Ambulatory Visit (INDEPENDENT_AMBULATORY_CARE_PROVIDER_SITE_OTHER): Payer: Medicare Other | Admitting: Family Medicine

## 2015-12-10 ENCOUNTER — Encounter: Payer: Self-pay | Admitting: Family Medicine

## 2015-12-10 VITALS — BP 122/64 | HR 67 | Temp 97.6°F | Resp 16 | Ht 70.0 in | Wt 184.0 lb

## 2015-12-10 DIAGNOSIS — E785 Hyperlipidemia, unspecified: Secondary | ICD-10-CM

## 2015-12-10 DIAGNOSIS — Z9981 Dependence on supplemental oxygen: Secondary | ICD-10-CM

## 2015-12-10 DIAGNOSIS — I129 Hypertensive chronic kidney disease with stage 1 through stage 4 chronic kidney disease, or unspecified chronic kidney disease: Secondary | ICD-10-CM

## 2015-12-10 DIAGNOSIS — K219 Gastro-esophageal reflux disease without esophagitis: Secondary | ICD-10-CM | POA: Diagnosis not present

## 2015-12-10 DIAGNOSIS — G890 Central pain syndrome: Secondary | ICD-10-CM | POA: Diagnosis not present

## 2015-12-10 DIAGNOSIS — F319 Bipolar disorder, unspecified: Secondary | ICD-10-CM | POA: Diagnosis not present

## 2015-12-10 DIAGNOSIS — J9612 Chronic respiratory failure with hypercapnia: Secondary | ICD-10-CM | POA: Diagnosis not present

## 2015-12-10 DIAGNOSIS — I69359 Hemiplegia and hemiparesis following cerebral infarction affecting unspecified side: Secondary | ICD-10-CM | POA: Diagnosis not present

## 2015-12-10 DIAGNOSIS — N182 Chronic kidney disease, stage 2 (mild): Secondary | ICD-10-CM | POA: Diagnosis not present

## 2015-12-10 DIAGNOSIS — J432 Centrilobular emphysema: Secondary | ICD-10-CM

## 2015-12-10 DIAGNOSIS — R911 Solitary pulmonary nodule: Secondary | ICD-10-CM | POA: Diagnosis not present

## 2015-12-10 MED ORDER — GABAPENTIN 300 MG PO CAPS
ORAL_CAPSULE | ORAL | Status: DC
Start: 2015-12-10 — End: 2017-02-04

## 2015-12-10 MED ORDER — RANITIDINE HCL 150 MG PO TABS: 150.0000 mg | ORAL_TABLET | Freq: Every day | ORAL | Status: DC

## 2015-12-10 NOTE — Assessment & Plan Note (Signed)
Old CVA 2014. Asprin and statin for secondary prevention. Alarm symptoms reviewed.

## 2015-12-10 NOTE — Assessment & Plan Note (Signed)
Continues on 2LNC at night for chronic respiratory failure as related to COPD.

## 2015-12-10 NOTE — Patient Instructions (Signed)
We will get a CT scan to follow-up on your lung nodules. We will call you with date and time of the appt.   GERD: Try taking Zantac- Ranitidine '150mg'$  once daily at bedtime to help with your symptoms. We will check back in 1 mos.   HTN: Continue to take medications as directed. Your goal blood pressure is 100/60-140/90.  Please call if consistently elevated above 140/90 Work on low salt/sodium diet - goal <1.5gm (1,'500mg'$ ) per day. Eat a diet high in fruits/vegetables and whole grains.  Look into mediterranean and DASH diet. Goal activity is 168mn/wk of moderate intensity exercise.  This can be split into 30 minute chunks.  If you are not at this level, you can start with smaller 10-15 min increments and slowly build up activity. Look at [facility] for more resources   Please seek immediate medical attention at ER or Urgent Care if you develop: Chest pain, pressure or tightness. Shortness of breath accompanied by nausea or diaphoresis Visual changes Numbness or tingling on one side of the body Facial droop Altered mental status Or any concerning symptoms.

## 2015-12-10 NOTE — Assessment & Plan Note (Signed)
Continues on statin. Check Lipid panel. Encouraged heart healthy diet.

## 2015-12-10 NOTE — Telephone Encounter (Signed)
Patient advised of CT scan scheduled for 12/18/2015 arrival at 2:45 for 3:pm at [facility]. Information given to patient and girlfriend.M[place]

## 2015-12-10 NOTE — Assessment & Plan Note (Signed)
BP controlled today. Continue current regimen. CMET to check kidney function. ACE for renal protection. Recheck 3 mos.

## 2015-12-10 NOTE — Progress Notes (Signed)
Subjective:    Patient ID: David Alpers., male    DOB: 29-Sep-1955, 60 y.o.   MRN: [id number]  HPI: David Hall. is a 60 y.o. male presenting on 12/10/2015 for Hypertension; Hyperlipidemia; Gastroesophageal Reflux; and COPD   HPI  Pt presents for blood pressure and cholesterol follow-up. Doing well at home. Check BP at home- avg 147-157/80-90 at home. No HA, visual changes, or chest pains. Baseline SOB with COPD for which he is on home O2 at night. Occasional HA when BP is elevated. No focal neurological symptoms. Pt is not sure what medications he is taking. His home health aide fills his pill box.  GERD: Symptoms mildly worsening- not helping as much.  No regurg. Feels like food is getting stuck in his throat. Heart burning after each meal. Taking '20mg'$  of omeprazole twice daily.  Chronic pain: Seen at pain clinic. They did not think his pain could be treated at pain clinic due to post stroke pain. Suggested to increase gabapentin.  R hemiplegia is R shoulder drop due to old CVA: Discharged from home health PT/OT due to meeting goals. Now ambulating with cane. Orthotic for shoulder obtain and patient wearing daily.  Bipolar disorder: Following with [facility].  COPD: At baseline SOB. Using inhalers daily. States he has stopped smoking. Continues 2LNC at night for hypoxia. No increase in sputum production. No increase in dyspnea.   Past Medical History  Diagnosis Date  . Hypertension   . GERD (gastroesophageal reflux disease)   . Bipolar disorder ([place])   . Shortness of breath   . Insomnia, controlled   . Hyperlipidemia   . COPD (chronic obstructive pulmonary disease) ([place])     on home oxygen.   . Substance abuse 2016    cocaine use.  . Obesity   . Stroke [facility]) 2014    R sided hemiplegia  . Oxygen deficiency     2 LNC at night    Current Outpatient Prescriptions on File Prior to Visit  Medication Sig  . ADVAIR DISKUS 250-50 MCG/DOSE AEPB INHALE 1 PUFF BY MOUTH TWICE DAILY   . albuterol (PROVENTIL) (2.5 MG/3ML) 0.083% nebulizer solution USE 1 VIAL VIA NEBULIZER EVERY 6 HOURS AS NEEDED FOR WHEEZING OR SHORTNESS OF BREATH  . albuterol (VENTOLIN HFA) 108 (90 BASE) MCG/ACT inhaler Inhale 2 puffs into the lungs 4 (four) times daily as needed.  [name] amLODipine (NORVASC) 10 MG tablet Take 1 tablet (10 mg total) by mouth daily.  [name] aspirin 325 MG EC tablet Take 325 mg by mouth daily.  [name] atorvastatin (LIPITOR) 40 MG tablet TAKE 1 TABLET BY MOUTH EVERY DAY  . Brexpiprazole (REXULTI) 2 MG TABS Take 2 mg by mouth at bedtime.  . chlorhexidine (HIBICLENS) 4 % external liquid Apply 1 application topically 2 (two) times daily.  [name] dextromethorphan-guaiFENesin (MUCINEX DM) 30-600 MG 12hr tablet Take 1 tablet by mouth 2 (two) times daily.  . divalproex (DEPAKOTE) 250 MG DR tablet Take 500 mg by mouth 2 (two) times daily.   . DULoxetine (CYMBALTA) 30 MG capsule TK 3 CS PO QAM  . Elastic Bandages & Supports (ADJUSTABLE ARM SLING) MISC 1 each by Does not apply route daily. Apply to R arm.  . hydrALAZINE (APRESOLINE) 25 MG tablet Take 1 tablet (25 mg total) by mouth 4 (four) times daily. (Patient taking differently: Take 25 mg by mouth 3 (three) times daily. )  . hydrochlorothiazide (HYDRODIURIL) 12.5 MG tablet Take 1 tablet (12.5 mg total) by mouth  daily.  . ibuprofen (ADVIL,MOTRIN) 400 MG tablet TAKE 1 TABLET(400 MG) BY MOUTH TWICE DAILY  . isosorbide mononitrate (IMDUR) 30 MG 24 hr tablet TAKE 1 TABLET BY MOUTH EVERY DAY  . lisinopril (PRINIVIL,ZESTRIL) 40 MG tablet Take 1 tablet (40 mg total) by mouth daily.  . metoprolol (LOPRESSOR) 50 MG tablet TAKE 1 TABLET BY MOUTH TWICE DAILY  . nitroGLYCERIN (NITROSTAT) 0.3 MG SL tablet Place 1 tablet (0.3 mg total) under the tongue every 5 (five) minutes as needed for chest pain.  [name] omeprazole (PRILOSEC) 20 MG capsule TAKE 1 CAPSULE BY MOUTH TWICE DAILY  . OXYGEN 2 L by Does not apply route at bedtime.  . traZODone (DESYREL) 50 MG tablet Take by  mouth. Reported on 10/27/2015   No current facility-administered medications on file prior to visit.    Review of Systems Per HPI unless specifically indicated above     Objective:    BP 122/64 mmHg  Pulse 67  Temp(Src) 97.6 F (36.4 C) (Oral)  Resp 16  Ht '5\' 10"'$  (1.778 m)  Wt 184 lb (83.462 kg)  BMI 26.40 kg/m2  SpO2 97%  Wt Readings from Last 3 Encounters:  12/10/15 184 lb (83.462 kg)  10/27/15 195 lb (88.451 kg)  09/26/15 191 lb (86.637 kg)    Physical Exam Results for orders placed or performed in visit on 08/28/15  ToxASSURE Select 29 (MW), Urine  Result Value Ref Range   Report Summary FINAL    PDF .       Assessment & Plan:   Problem List Items Addressed This Visit      Cardiovascular and Mediastinum   Benign hypertension with CKD (chronic kidney disease), stage II - Primary    BP controlled today. Continue current regimen. CMET to check kidney function. ACE for renal protection. Recheck 3 mos.       Relevant Orders   Comprehensive metabolic panel     Respiratory   Chronic respiratory failure (HCC)    Due to COPD. On home O2 2LNC at night for hypoxia.       COPD (chronic obstructive pulmonary disease) with emphysema (HCC)    At baseline shortness of breath. Continue inhalers. Encouraged smoking cessation. Reviewed alarm symptoms. Continue O2 at night. Recheck 3 mos.         Digestive   GERD (gastroesophageal reflux disease)    Symptoms not controlled. Try adding H2 blocker. Recheck 1 mos. If symptoms not controlled, refer to GI for EGD.       Relevant Medications   ranitidine (ZANTAC) 150 MG tablet     Nervous and Auditory   Central pain syndrome (CPS) (Chronic)    Thought to be source of chronic pain as per pain management. Will try increasing gabapentin as per pain management recommendations. Recheck 1 mos.       Relevant Medications   gabapentin (NEURONTIN) 300 MG capsule   Right Hemiparesis and Hemialgia following contralateral (Left)  cerebrovascular accident (CVA) ([place]) (Chronic)    Pt was discharged from PT/OT at home due to meeting goals. Has orthotic to help with R shoulder drop.       CVA, old, hemiparesis ([place])    Old CVA 2014. Asprin and statin for secondary prevention. Alarm symptoms reviewed.         Other   Dyslipidemia    Continues on statin. Check Lipid panel. Encouraged heart healthy diet.       Relevant Orders   Lipid Profile   Bipolar 1  disorder, depressed ([place])    Managed at [facility]. Pt encouraged to continue to follow with psychiatry.       Pulmonary nodule    Pt missed CT appt with cancer center and has not followed in >1 year. Order CT to monitor. Referral to [facility] cancer center if needed after CT scan.       Relevant Orders   CT Chest Wo Contrast   On home oxygen therapy    Continues on 2LNC at night for chronic respiratory failure as related to COPD.          Meds ordered this encounter  Medications  . ranitidine (ZANTAC) 150 MG tablet    Sig: Take 1 tablet (150 mg total) by mouth at bedtime.    Dispense:  90 tablet    Refill:  3    Order Specific Question:  Supervising Provider    Answer:  Arlis Porta [id number]  . gabapentin (NEURONTIN) 300 MG capsule    Sig: Take 2 capsule in the AM, 1 capsule in the afternoon and 2 capsules at night.    Dispense:  450 capsule    Refill:  3    Order Specific Question:  Supervising Provider    Answer:  Arlis Porta [[id number]]      Follow up plan: Return in about 4 weeks (around 01/07/2016) for GERD.[name]

## 2015-12-10 NOTE — Assessment & Plan Note (Signed)
Managed at [facility]. Pt encouraged to continue to follow with psychiatry.

## 2015-12-10 NOTE — Assessment & Plan Note (Signed)
Symptoms not controlled. Try adding H2 blocker. Recheck 1 mos. If symptoms not controlled, refer to GI for EGD.

## 2015-12-10 NOTE — Assessment & Plan Note (Addendum)
At baseline shortness of breath. Continue inhalers. Encouraged  Continued smoking cessation. Reviewed alarm symptoms. Continue O2 at night. Recheck 3 mos.

## 2015-12-10 NOTE — Assessment & Plan Note (Signed)
Due to COPD. On home O2 2LNC at night for hypoxia.

## 2015-12-10 NOTE — Assessment & Plan Note (Signed)
Pt missed CT appt with cancer center and has not followed in >1 year. Order CT to monitor. Referral to [facility] cancer center if needed after CT scan.

## 2015-12-10 NOTE — Assessment & Plan Note (Signed)
Pt was discharged from PT/OT at home due to meeting goals. Has orthotic to help with R shoulder drop.

## 2015-12-10 NOTE — Assessment & Plan Note (Signed)
Thought to be source of chronic pain as per pain management. Will try increasing gabapentin as per pain management recommendations. Recheck 1 mos.

## 2015-12-18 ENCOUNTER — Ambulatory Visit
Admission: RE | Admit: 2015-12-18 | Discharge: 2015-12-18 | Disposition: A | Discharge status: Home / Self Care | Payer: Medicare Other | Source: Ambulatory Visit | Attending: Family Medicine | Admitting: Family Medicine

## 2015-12-18 DIAGNOSIS — R911 Solitary pulmonary nodule: Secondary | ICD-10-CM | POA: Diagnosis not present

## 2015-12-18 DIAGNOSIS — R918 Other nonspecific abnormal finding of lung field: Secondary | ICD-10-CM | POA: Insufficient documentation

## 2015-12-18 DIAGNOSIS — N182 Chronic kidney disease, stage 2 (mild): Secondary | ICD-10-CM | POA: Diagnosis not present

## 2015-12-18 DIAGNOSIS — E785 Hyperlipidemia, unspecified: Secondary | ICD-10-CM | POA: Diagnosis not present

## 2015-12-18 DIAGNOSIS — I129 Hypertensive chronic kidney disease with stage 1 through stage 4 chronic kidney disease, or unspecified chronic kidney disease: Secondary | ICD-10-CM | POA: Diagnosis not present

## 2015-12-19 LAB — COMPREHENSIVE METABOLIC PANEL
ALT: 9 IU/L (ref 0–44)
AST: 10 IU/L (ref 0–40)
Albumin/Globulin Ratio: 1.2 (ref 1.2–2.2)
Albumin: 3.6 g/dL (ref 3.5–5.5)
Alkaline Phosphatase: 64 IU/L (ref 39–117)
BUN/Creatinine Ratio: 11 (ref 9–20)
BUN: 11 mg/dL (ref 6–24)
Bilirubin Total: <0.2 mg/dL (ref 0.0–1.2)
CO2: 25 mmol/L (ref 18–29)
Calcium: 8.7 mg/dL (ref 8.7–10.2)
Chloride: 95 mmol/L — ABNORMAL LOW (ref 96–106)
Creatinine, Ser: 1.02 mg/dL (ref 0.76–1.27)
GFR calc Af Amer: 93 mL/min/{1.73_m2} (ref >59)
GFR calc non Af Amer: 80 mL/min/{1.73_m2} (ref >59)
Globulin, Total: 2.9 g/dL (ref 1.5–4.5)
Glucose: 71 mg/dL (ref 65–99)
Potassium: 4.7 mmol/L (ref 3.5–5.2)
Sodium: 138 mmol/L (ref 134–144)
Total Protein: 6.5 g/dL (ref 6.0–8.5)

## 2015-12-19 LAB — LIPID PANEL
Chol/HDL Ratio: 3.6 ratio units (ref 0.0–5.0)
Cholesterol, Total: 128 mg/dL (ref 100–199)
HDL: 36 mg/dL — ABNORMAL LOW (ref >39)
LDL Calculated: 65 mg/dL (ref 0–99)
Triglycerides: 136 mg/dL (ref 0–149)
VLDL Cholesterol Cal: 27 mg/dL (ref 5–40)

## 2015-12-30 ENCOUNTER — Telehealth: Payer: Self-pay | Admitting: Family Medicine

## 2015-12-30 DIAGNOSIS — I69359 Hemiplegia and hemiparesis following cerebral infarction affecting unspecified side: Secondary | ICD-10-CM

## 2015-12-30 NOTE — Telephone Encounter (Signed)
Pt needs a prescription for portable beside toilet sent to [facility].  His old one is wearing out.  Please call [phone]

## 2015-12-30 NOTE — Telephone Encounter (Signed)
Order placed. Will fax to [facility]. Thanks! AK

## 2015-12-31 ENCOUNTER — Telehealth: Payer: Self-pay | Admitting: *Deleted

## 2015-12-31 NOTE — Telephone Encounter (Signed)
Called patient to inform [employer] form ready for pickup.

## 2016-01-12 ENCOUNTER — Ambulatory Visit: Payer: Self-pay | Admitting: Family Medicine

## 2016-02-03 ENCOUNTER — Other Ambulatory Visit: Payer: Self-pay | Admitting: Family Medicine

## 2016-02-13 LAB — COLOGUARD: Cologuard: NEGATIVE

## 2016-03-11 ENCOUNTER — Encounter: Payer: Self-pay | Admitting: Family Medicine

## 2016-03-11 ENCOUNTER — Ambulatory Visit (INDEPENDENT_AMBULATORY_CARE_PROVIDER_SITE_OTHER): Payer: Medicare Other | Admitting: Family Medicine

## 2016-03-11 VITALS — BP 140/74 | HR 73 | Temp 98.3°F | Resp 16 | Ht 70.0 in | Wt 181.6 lb

## 2016-03-11 DIAGNOSIS — Z9981 Dependence on supplemental oxygen: Secondary | ICD-10-CM | POA: Diagnosis not present

## 2016-03-11 DIAGNOSIS — J432 Centrilobular emphysema: Secondary | ICD-10-CM | POA: Diagnosis not present

## 2016-03-11 DIAGNOSIS — N182 Chronic kidney disease, stage 2 (mild): Secondary | ICD-10-CM | POA: Diagnosis not present

## 2016-03-11 DIAGNOSIS — R911 Solitary pulmonary nodule: Secondary | ICD-10-CM | POA: Diagnosis not present

## 2016-03-11 DIAGNOSIS — I129 Hypertensive chronic kidney disease with stage 1 through stage 4 chronic kidney disease, or unspecified chronic kidney disease: Secondary | ICD-10-CM | POA: Diagnosis not present

## 2016-03-11 DIAGNOSIS — I69359 Hemiplegia and hemiparesis following cerebral infarction affecting unspecified side: Secondary | ICD-10-CM

## 2016-03-11 DIAGNOSIS — J9612 Chronic respiratory failure with hypercapnia: Secondary | ICD-10-CM | POA: Diagnosis not present

## 2016-03-11 MED ORDER — IPRATROPIUM-ALBUTEROL 0.5-2.5 (3) MG/3ML IN SOLN: 3.0000 mL | Freq: Once | RESPIRATORY_TRACT | Status: DC

## 2016-03-11 MED ORDER — PREDNISONE 20 MG PO TABS: 40.0000 mg | ORAL_TABLET | Freq: Every day | ORAL | 0 refills | Status: DC

## 2016-03-11 NOTE — Patient Instructions (Signed)
We will plan to treat a COPD exacerbation today. Take prednisone '40mg'$  once daily for 5 days.  This should help your breathing. If you short of breath despite breathing treatments, spike a fever, cough up blood tinged or large amounts of green gray sputum, please seek immediate medical attention.  Your goal blood pressure is 140/90 Work on low salt/sodium diet - goal <1.5gm (1,'500mg'$ ) per day. Eat a diet high in fruits/vegetables and whole grains.  Look into mediterranean and DASH diet. Goal activity is 159mn/wk of moderate intensity exercise.  This can be split into 30 minute chunks.  If you are not at this level, you can start with smaller 10-15 min increments and slowly build up activity. Look at [facility] for more resources  Please seek immediate medical attention at ER or Urgent Care if you develop: Chest pain, pressure or tightness. Shortness of breath accompanied by nausea or diaphoresis Visual changes Numbness or tingling on one side of the body Facial droop Altered mental status Or any concerning symptoms.

## 2016-03-11 NOTE — Assessment & Plan Note (Signed)
Mildly elevated on recheck but pt had not taken medication. Will have him return 4 weeks for BP check.

## 2016-03-11 NOTE — Assessment & Plan Note (Signed)
Stable. R sided weakness. Pt is not wearing shoulder brace today to help with his lack of function in R arm

## 2016-03-11 NOTE — Assessment & Plan Note (Addendum)
Treat for exacerbation today given increase dyspnea, decreased breath sounds, and wheezing. Improved after nebulizer. No need for CXR at this time. Will refer to pulmonology for severe COPD. Previously seeing [facility]. Has not followed 2/2 transportation issues.

## 2016-03-11 NOTE — Progress Notes (Signed)
Subjective:    Patient ID: David Hall., male    DOB: 09/06/1955, 60 y.o.   MRN: [id number]  HPI: David Hall. is a 60 y.o. male presenting on 03/11/2016 for Hypertension   HPI  Pt presents for follow-up of hypertension, COPD COPD: Feels likes his breathing is shallow. Wearing oxygen at night. Smoking 1PPD. Cough non-productive on a regular basis. Feels more short of breath than usual today. Uses inhaler as needed- 2-3 times per week. Using advair daily. Uses nebulizer at home. Breathing is worse in the summer. His AC is out.  Hypertension: Did not take medications this morning. Is out of medication. Last taken yesterday. He ran out last night. No chest pains. No visual changes. No headaches.  R sideded hemiparesis- stable. Not wearing his brace today.  Pulmonary nodule- needs 6-12 mos follow-up. Due November.   Past Medical History:  Diagnosis Date  . Bipolar disorder ([place])   . COPD (chronic obstructive pulmonary disease) ([place])    on home oxygen.   [name] GERD (gastroesophageal reflux disease)   . Hyperlipidemia   . Hypertension   . Insomnia, controlled   . Obesity   . Oxygen deficiency    2 LNC at night  . Shortness of breath   . Stroke [facility]) 2014   R sided hemiplegia  . Substance abuse 2016   cocaine use.    Current Outpatient Prescriptions on File Prior to Visit  Medication Sig  . ADVAIR DISKUS 250-50 MCG/DOSE AEPB INHALE 1 PUFF BY MOUTH TWICE DAILY  . albuterol (PROVENTIL) (2.5 MG/3ML) 0.083% nebulizer solution USE 1 VIAL VIA NEBULIZER EVERY 6 HOURS AS NEEDED FOR WHEEZING OR SHORTNESS OF BREATH  . albuterol (VENTOLIN HFA) 108 (90 BASE) MCG/ACT inhaler Inhale 2 puffs into the lungs 4 (four) times daily as needed.  [name] amLODipine (NORVASC) 10 MG tablet Take 1 tablet (10 mg total) by mouth daily.  [name] aspirin 325 MG EC tablet Take 325 mg by mouth daily.  [name] atorvastatin (LIPITOR) 40 MG tablet TAKE 1 TABLET BY MOUTH EVERY DAY  . Brexpiprazole (REXULTI) 2 MG TABS  Take 2 mg by mouth at bedtime.  . chlorhexidine (HIBICLENS) 4 % external liquid Apply 1 application topically 2 (two) times daily.  [name] dextromethorphan-guaiFENesin (MUCINEX DM) 30-600 MG 12hr tablet Take 1 tablet by mouth 2 (two) times daily.  . divalproex (DEPAKOTE) 250 MG DR tablet Take 500 mg by mouth 2 (two) times daily.   . DULoxetine (CYMBALTA) 30 MG capsule TK 3 CS PO QAM  . Elastic Bandages & Supports (ADJUSTABLE ARM SLING) MISC 1 each by Does not apply route daily. Apply to R arm.  . gabapentin (NEURONTIN) 300 MG capsule Take 2 capsule in the AM, 1 capsule in the afternoon and 2 capsules at night.  . hydrALAZINE (APRESOLINE) 25 MG tablet Take 1 tablet (25 mg total) by mouth 4 (four) times daily. (Patient taking differently: Take 25 mg by mouth 3 (three) times daily. )  . hydrochlorothiazide (HYDRODIURIL) 12.5 MG tablet Take 1 tablet (12.5 mg total) by mouth daily.  [name] ibuprofen (ADVIL,MOTRIN) 400 MG tablet TAKE 1 TABLET(400 MG) BY MOUTH TWICE DAILY  . isosorbide mononitrate (IMDUR) 30 MG 24 hr tablet TAKE 1 TABLET BY MOUTH EVERY DAY  . lisinopril (PRINIVIL,ZESTRIL) 40 MG tablet Take 1 tablet (40 mg total) by mouth daily.  . metoprolol (LOPRESSOR) 50 MG tablet TAKE 1 TABLET BY MOUTH TWICE DAILY  . nitroGLYCERIN (NITROSTAT) 0.3 MG SL tablet Place 1  tablet (0.3 mg total) under the tongue every 5 (five) minutes as needed for chest pain.  [name] omeprazole (PRILOSEC) 20 MG capsule TAKE 1 CAPSULE BY MOUTH TWICE DAILY  . OXYGEN 2 L by Does not apply route at bedtime.  . ranitidine (ZANTAC) 150 MG tablet Take 1 tablet (150 mg total) by mouth at bedtime.  . traZODone (DESYREL) 50 MG tablet Take by mouth. Reported on 10/27/2015   No current facility-administered medications on file prior to visit.     Review of Systems  Constitutional: Negative for chills and fever.  HENT: Negative.  Negative for congestion, sinus pressure and trouble swallowing.   Respiratory: Positive for cough, shortness of breath  and wheezing. Negative for chest tightness.   Cardiovascular: Negative for chest pain, palpitations and leg swelling.  Gastrointestinal: Negative for abdominal pain, nausea and vomiting.  Endocrine: Negative.   Genitourinary: Negative for discharge, dysuria, penile pain, testicular pain and urgency.  Musculoskeletal: Negative for arthralgias, back pain and joint swelling.  Skin: Negative.   Neurological: Positive for weakness (R sided s/p stroke). Negative for dizziness, numbness and headaches.  Psychiatric/Behavioral: Negative for dysphoric mood and sleep disturbance.   Per HPI unless specifically indicated above     Objective:    BP 140/74 (BP Location: Left Arm, Cuff Size: Normal)   Pulse 73   Temp 98.3 F (36.8 C) (Oral)   Resp 16   Ht '5\' 10"'$  (1.778 m)   Wt 181 lb 9.6 oz (82.4 kg)   SpO2 94%   BMI 26.06 kg/m   Wt Readings from Last 3 Encounters:  03/11/16 181 lb 9.6 oz (82.4 kg)  12/10/15 184 lb (83.5 kg)  10/27/15 195 lb (88.5 kg)    Physical Exam  Constitutional: He is oriented to person, place, and time. He appears well-developed and well-nourished. No distress.  HENT:  Head: Normocephalic and atraumatic.  Neck: Neck supple. No thyromegaly present.  Cardiovascular: Normal rate, regular rhythm and normal heart sounds.  Exam reveals no gallop and no friction rub.   No murmur heard. Pulmonary/Chest: Effort normal. No accessory muscle usage. No tachypnea. No respiratory distress. He has decreased breath sounds in the right middle field, the right lower field, the left middle field and the left lower field. He has wheezes in the right upper field and the left upper field. Chest wall is not dull to percussion. He exhibits no tenderness.  Hyper resonant to percussion.   Abdominal: Soft. Bowel sounds are normal. He exhibits no distension. There is no tenderness. There is no rebound.  Musculoskeletal: Normal range of motion. He exhibits no edema or tenderness.  Neurological: He  is alert and oriented to person, place, and time. He has normal reflexes. He displays no atrophy. He displays no seizure activity.  Reflex Scores:      Patellar reflexes are 2+ on the right side and 2+ on the left side. R sided weakness 4/5 LE and 3/5 UE.  Shuffling gait.   Skin: Skin is warm and dry. No rash noted. No cyanosis or erythema. Nails show clubbing.  Psychiatric: He has a normal mood and affect. His behavior is normal. Thought content normal.   Results for orders placed or performed in visit on 12/10/15  Comprehensive metabolic panel  Result Value Ref Range   Glucose 71 65 - 99 mg/dL   BUN 11 6 - 24 mg/dL   Creatinine, Ser 1.02 0.76 - 1.27 mg/dL   GFR calc non Af Amer 80 >59 mL/min/1.73  GFR calc Af Amer 93 >59 mL/min/1.73   BUN/Creatinine Ratio 11 9 - 20   Sodium 138 134 - 144 mmol/L   Potassium 4.7 3.5 - 5.2 mmol/L   Chloride 95 (L) 96 - 106 mmol/L   CO2 25 18 - 29 mmol/L   Calcium 8.7 8.7 - 10.2 mg/dL   Total Protein 6.5 6.0 - 8.5 g/dL   Albumin 3.6 3.5 - 5.5 g/dL   Globulin, Total 2.9 1.5 - 4.5 g/dL   Albumin/Globulin Ratio 1.2 1.2 - 2.2   Bilirubin Total <0.2 0.0 - 1.2 mg/dL   Alkaline Phosphatase 64 39 - 117 IU/L   AST 10 0 - 40 IU/L   ALT 9 0 - 44 IU/L  Lipid Profile  Result Value Ref Range   Cholesterol, Total 128 100 - 199 mg/dL   Triglycerides 136 0 - 149 mg/dL   HDL 36 (L) >39 mg/dL   VLDL Cholesterol Cal 27 5 - 40 mg/dL   LDL Calculated 65 0 - 99 mg/dL   Chol/HDL Ratio 3.6 0.0 - 5.0 ratio units      Assessment & Plan:   Problem List Items Addressed This Visit      Cardiovascular and Mediastinum   Benign hypertension with CKD (chronic kidney disease), stage II    Mildly elevated on recheck but pt had not taken medication. Will have him return 4 weeks for BP check.         Respiratory   Chronic respiratory failure (HCC)    Requiring 2LNC at night.       Relevant Orders   Ambulatory referral to Pulmonology   COPD (chronic obstructive  pulmonary disease) with emphysema ([place]) - Primary    Treat for exacerbation today given increase dyspnea, decreased breath sounds, and wheezing. Improved after nebulizer. No need for CXR at this time. Will refer to pulmonology for severe COPD. Previously seeing [facility]. Has not followed 2/2 transportation issues.       Relevant Medications   ipratropium-albuterol (DUONEB) 0.5-2.5 (3) MG/3ML nebulizer solution 3 mL   predniSONE (DELTASONE) 20 MG tablet   Other Relevant Orders   Ambulatory referral to Pulmonology     Nervous and Auditory   Right 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) ([place]) (Chronic)    Stable. R sided weakness. Pt is not wearing shoulder brace today to help with his lack of function in R arm        Other   Pulmonary nodule    Stable. Due for imaging in November.       On home oxygen therapy    Contine 2LNC at home.       Relevant Orders   Ambulatory referral to Pulmonology    Other Visit Diagnoses   None.     Meds ordered this encounter  Medications  . ipratropium-albuterol (DUONEB) 0.5-2.5 (3) MG/3ML nebulizer solution 3 mL  . predniSONE (DELTASONE) 20 MG tablet    Sig: Take 2 tablets (40 mg total) by mouth daily with breakfast.    Dispense:  10 tablet    Refill:  0    Order Specific Question:   Supervising Provider    Answer:   Arlis Porta [[id number]]      Follow up plan: Return in about 1 month (around 04/11/2016) for HTN. [name]

## 2016-03-11 NOTE — Assessment & Plan Note (Signed)
Contine 2LNC at home.

## 2016-03-11 NOTE — Assessment & Plan Note (Signed)
Requiring 2LNC at night.

## 2016-03-11 NOTE — Assessment & Plan Note (Signed)
Stable. Due for imaging in November.

## 2016-03-18 ENCOUNTER — Institutional Professional Consult (permissible substitution): Payer: Medicare Other | Admitting: Internal Medicine

## 2016-03-22 NOTE — Progress Notes (Signed)
[facility] Consultation      Assessment and Plan:  Dyspnea.  -Likely multifactorial from debility/deconditioning, emphysema, hemiplegia from stroke.   Emphysema.  - emphysema seen on chest x-ray, mild eosinophilia seen in previous CBC, will continue current dose of Advair. -Currently not a candidate for pulmonary rehabilitation due to continued smoking  Nicotine Abuse.  -Discussed importance of smoking cessation. -Vent to 3 minutes in counseling.  Lung nodule.  -4 mm or less nodule seen in right upper lobe. Repeat CT scan and approximate 6 months.  Gait instability.  -Status post CVA with gait instability, will see if we can get him some home physical therapy, as this may help with his dyspnea.  Date: 03/22/2016  MRN# [id number] David Hall. April 11, 1956  Referring Physician: Aviva Signs.   Add David Lunt. is a 60 y.o. old male seen in consultation for chief complaint of:    Chief Complaint  Patient presents with  . pulmonary consult    per Marvetta Gibbons.     HPI:   The patient is a 60 year old male smoker with history of COPD and lung nodule, referred for cough and dyspnea.  He has been having trouble with breathing for many years, probably 4 or 5. He had a stroke and is weak on the right side and has worse wheezing and dyspnea since that time. He is smoking about 15 cigs before. He has quit for 30 days in the past on his own. He has patches and chantix in the past but did not feel that they worked. He usually gets around with a wheelchair, but today he is using a cane which he does not like to do because he often falls when doing that. He last fell about 2 weeks ago.  He lives by himself, he has a someone come in to help with bathing and cooking. He does not drive. He does not get his own groceries or cook. He lives in his own house. He has someone else to buy his smokes for him.   He is on advair disk bid, he has a rescue inhaler that he has  used 3 times this week. He does not think that he has ever had a lung function test. He uses 2L oxygen at night.   Sat at rest on RA today is 93% and HR is 58. Walked 100 feet with assistance sat 92% HR 66, mild dyspnea. Gait very unstable due to hemiplegia.    Labs: Eosiniphil range 0-5% Reviewed CT chest images from 12/18/15, chest x-ray from 09/01/14: Previous chest x-ray shows some chronic bronchitic changes, CT of the chest shows mild, early bronchiectatic changes. No significant emphysematous changes are noted. There are 2 tiny nodules in the right upper lobe, both 4 mm or smaller. There also stranding changes of chronic bronchitis. No significant lymphadenopathy noted  PMHX:   Past Medical History:  Diagnosis Date  . Bipolar disorder ([place])   . COPD (chronic obstructive pulmonary disease) ([place])    on home oxygen.   [name] GERD (gastroesophageal reflux disease)   . Hyperlipidemia   . Hypertension   . Insomnia, controlled   . Obesity   . Oxygen deficiency    2 LNC at night  . Shortness of breath   . Stroke [facility]) 2014   R sided hemiplegia  . Substance abuse 2016   cocaine use.   Surgical Hx:  Past Surgical History:  Procedure Laterality Date  . CARPAL TUNNEL RELEASE    .  TONSILLECTOMY     Family Hx:  Family History  Problem Relation Age of Onset  . CAD Mother   . Hypertension Mother   . Stroke Mother   . Lung disease Father    Social Hx:   Social History  Substance Use Topics  . Smoking status: Former Smoker    Packs/day: 1.00    Types: Cigarettes  . Smokeless tobacco: Never Used  . Alcohol use No   Medication:   Reviewed.     Allergies:  Quetiapine; Codeine; and Seroquel [quetiapine fumarate]  Review of Systems: Gen:  Denies  fever, sweats, chills HEENT: Denies blurred vision, double vision. bleeds, sore throat Cvc:  No dizziness, chest pain. Resp:   Denies cough or sputum production. Gi: Denies swallowing difficulty, stomach pain. Gu:  Denies bladder  incontinence, burning urine Ext:   No Joint pain, stiffness. Skin: No skin rash,  hives  Endoc:  No polyuria, polydipsia. Psych: No depression, insomnia. Other:  All other systems were reviewed with the patient and were negative other that what is mentioned in the HPI.   Physical Examination:   VS: BP 140/78 (BP Location: Left Arm, Cuff Size: Normal)   Pulse 65   Ht '5\' 10"'$  (1.778 m)   Wt 182 lb 6.4 oz (82.7 kg)   SpO2 95%   BMI 26.17 kg/m   General Appearance: No distress  Neuro:without focal findings,  speech normal,  HEENT: PERRLA, EOM intact.   Pulmonary: normal breath sounds, No wheezing.  CardiovascularNormal S1,S2.  No m/r/g.   Abdomen: Benign, Soft, non-tender. Renal:  No costovertebral tenderness  GU:  No performed at this time. Endoc: No evident thyromegaly, no signs of acromegaly. Skin:   warm, no rashes, no ecchymosis  Extremities: normal, no cyanosis, clubbing.  Other findings:    LABORATORY PANEL:   CBC No results for input(s): WBC, HGB, HCT, PLT in the last 168 hours. ------------------------------------------------------------------------------------------------------------------  Chemistries  No results for input(s): NA, K, CL, CO2, GLUCOSE, BUN, CREATININE, CALCIUM, MG, AST, ALT, ALKPHOS, BILITOT in the last 168 hours.  Invalid input(s): GFRCGP ------------------------------------------------------------------------------------------------------------------  Cardiac Enzymes No results for input(s): TROPONINI in the last 168 hours. ------------------------------------------------------------  RADIOLOGY:  No results found.     Thank  you for the consultation and for allowing [facility] to assist in the care of your patient. Our recommendations are noted above.  Please contact us if we can be of further service.   Marda Stalker, MD.  Board Certified in Internal Medicine, Pulmonary Medicine, [place],  and Sleep Medicine.  [facility] Office Number: [phone]  Patricia Pesa, M.D.  Vilinda Boehringer, M.D.  Merton Border, M.D  03/22/2016

## 2016-03-23 ENCOUNTER — Ambulatory Visit (INDEPENDENT_AMBULATORY_CARE_PROVIDER_SITE_OTHER): Payer: Medicare Other | Admitting: Internal Medicine

## 2016-03-23 ENCOUNTER — Encounter: Payer: Self-pay | Admitting: Internal Medicine

## 2016-03-23 VITALS — BP 140/78 | HR 65 | Ht 70.0 in | Wt 182.4 lb

## 2016-03-23 DIAGNOSIS — R2681 Unsteadiness on feet: Secondary | ICD-10-CM

## 2016-03-23 DIAGNOSIS — Z720 Tobacco use: Secondary | ICD-10-CM

## 2016-03-23 DIAGNOSIS — J438 Other emphysema: Secondary | ICD-10-CM | POA: Diagnosis not present

## 2016-03-23 DIAGNOSIS — R911 Solitary pulmonary nodule: Secondary | ICD-10-CM | POA: Diagnosis not present

## 2016-03-23 NOTE — Addendum Note (Signed)
Addended by: Oscar La R on: 03/23/2016 02:50 PM   Modules accepted: Orders

## 2016-03-23 NOTE — Patient Instructions (Addendum)
--  You need to quit smoking or your breathing will continue to worsen.   --Refer to in-home physical therapy due to gait instability.   -stop prednisone.   --Continue advair.   --CT chest without contrast in 6 months, and follow up after.   --Complete pulmonary function test before next visit.

## 2016-03-24 ENCOUNTER — Other Ambulatory Visit: Payer: Self-pay | Admitting: *Deleted

## 2016-03-24 DIAGNOSIS — R2681 Unsteadiness on feet: Secondary | ICD-10-CM

## 2016-03-25 ENCOUNTER — Other Ambulatory Visit: Payer: Self-pay | Admitting: *Deleted

## 2016-03-25 DIAGNOSIS — R2681 Unsteadiness on feet: Secondary | ICD-10-CM

## 2016-04-07 ENCOUNTER — Other Ambulatory Visit: Payer: Self-pay | Admitting: Family Medicine

## 2016-04-15 DIAGNOSIS — F411 Generalized anxiety disorder: Secondary | ICD-10-CM | POA: Diagnosis not present

## 2016-04-15 DIAGNOSIS — F332 Major depressive disorder, recurrent severe without psychotic features: Secondary | ICD-10-CM | POA: Diagnosis not present

## 2016-04-22 ENCOUNTER — Encounter: Payer: Self-pay | Admitting: Family Medicine

## 2016-05-11 DIAGNOSIS — F411 Generalized anxiety disorder: Secondary | ICD-10-CM | POA: Diagnosis not present

## 2016-05-11 DIAGNOSIS — F332 Major depressive disorder, recurrent severe without psychotic features: Secondary | ICD-10-CM | POA: Diagnosis not present

## 2016-05-11 DIAGNOSIS — F410 Panic disorder [episodic paroxysmal anxiety] without agoraphobia: Secondary | ICD-10-CM | POA: Diagnosis not present

## 2016-07-08 ENCOUNTER — Other Ambulatory Visit: Payer: Self-pay | Admitting: Family Medicine

## 2016-07-08 DIAGNOSIS — N182 Chronic kidney disease, stage 2 (mild): Principal | ICD-10-CM

## 2016-07-08 DIAGNOSIS — I129 Hypertensive chronic kidney disease with stage 1 through stage 4 chronic kidney disease, or unspecified chronic kidney disease: Secondary | ICD-10-CM

## 2016-07-08 MED ORDER — AMLODIPINE BESYLATE 10 MG PO TABS: 10.0000 mg | ORAL_TABLET | Freq: Every day | ORAL | 3 refills | Status: DC

## 2016-07-12 ENCOUNTER — Other Ambulatory Visit: Payer: Self-pay | Admitting: Family Medicine

## 2016-07-12 DIAGNOSIS — J432 Centrilobular emphysema: Secondary | ICD-10-CM

## 2016-07-12 MED ORDER — ALBUTEROL SULFATE HFA 108 (90 BASE) MCG/ACT IN AERS: 2.0000 | INHALATION_SPRAY | Freq: Four times a day (QID) | RESPIRATORY_TRACT | 11 refills | Status: DC | PRN

## 2016-07-23 ENCOUNTER — Encounter: Payer: Self-pay | Admitting: Family Medicine

## 2016-07-23 ENCOUNTER — Ambulatory Visit (INDEPENDENT_AMBULATORY_CARE_PROVIDER_SITE_OTHER): Payer: Medicare Other | Admitting: Family Medicine

## 2016-07-23 VITALS — BP 107/57 | HR 55 | Temp 97.3°F | Resp 16 | Ht 70.0 in | Wt 175.0 lb

## 2016-07-23 DIAGNOSIS — H1031 Unspecified acute conjunctivitis, right eye: Secondary | ICD-10-CM

## 2016-07-23 DIAGNOSIS — J9612 Chronic respiratory failure with hypercapnia: Secondary | ICD-10-CM

## 2016-07-23 DIAGNOSIS — Z23 Encounter for immunization: Secondary | ICD-10-CM | POA: Diagnosis not present

## 2016-07-23 DIAGNOSIS — Z9981 Dependence on supplemental oxygen: Secondary | ICD-10-CM | POA: Diagnosis not present

## 2016-07-23 DIAGNOSIS — J432 Centrilobular emphysema: Secondary | ICD-10-CM | POA: Diagnosis not present

## 2016-07-23 MED ORDER — POLYMYXIN B-TRIMETHOPRIM 10000-0.1 UNIT/ML-% OP SOLN: 1.0000 [drp] | OPHTHALMIC | 0 refills | Status: DC

## 2016-07-23 NOTE — Assessment & Plan Note (Addendum)
Stable, chronic hypercapnic respiratory failure in setting of COPD, on supplemental O2 and benefiting from therapy - Request order for portable oxygen concentrator  Plan: 1. Order placed in Epic for DME home oxygen, comments portable oxygen concentrator, continuous 2-3L overnight, and with activity

## 2016-07-23 NOTE — Patient Instructions (Signed)
Thank you for coming in to clinic today.  Use antibiotic eye drops as prescribed, continue warm compresses  If significant worsening redness, swelling of eyelid, pain, or loss of vision, please seek immediate treatment at Emergency Department.  We will contact [facility] for order of oxygen concentrator. Stay tuned for further information, check with them for an update next week if haven't heard back.   Bacterial Conjunctivitis Introduction Bacterial conjunctivitis is an infection of your conjunctiva. This is the clear membrane that covers the white part of your eye and the inner surface of your eyelid. This condition can make your eye:  Red or pink.  Itchy. This condition is caused by bacteria. This condition spreads very easily from person to person (is contagious) and from one eye to the other eye. Follow these instructions at home: Medicines  Take or apply your antibiotic medicine as told by your doctor. Do not stop taking or applying the antibiotic even if you start to feel better.  Take or apply over-the-counter and prescription medicines only as told by your doctor.  Do not touch your eyelid with the eye drop bottle or the ointment tube. Managing discomfort  Wipe any fluid from your eye with a warm, wet washcloth or a cotton ball.  Place a cool, clean washcloth on your eye. Do this for 10-20 minutes, 3-4 times per day. General instructions  Do not wear contact lenses until the irritation is gone. Wear glasses until your doctor says it is okay to wear contacts.  Do not wear eye makeup until your symptoms are gone. Throw away any old makeup.  Change or wash your pillowcase every day.  Do not share towels or washcloths with anyone.  Wash your hands often with soap and water. Use paper towels to dry your hands.  Do not touch or rub your eyes.  Do not drive or use heavy machinery if your vision is blurry. Contact a doctor if:  You have a fever.  Your  symptoms do not get better after 10 days. Get help right away if:  You have a fever and your symptoms suddenly get worse.  You have very bad pain when you move your eye.  Your face:  Hurts.  Is red.  Is swollen.  You have sudden loss of vision. This information is not intended to replace advice given to you by your health care provider. Make sure you discuss any questions you have with your health care provider. Document Released: 05/04/2008 Document Revised: 01/01/2016 Document Reviewed: 05/08/2015  2017 Elsevier   If you have any other questions or concerns, please feel free to call the clinic or send a message through [place]. You may also schedule an earlier appointment if necessary.  [name], DO [place]

## 2016-07-23 NOTE — Progress Notes (Signed)
Subjective:    Patient ID: David Hall., male    DOB: 06-29-1956, 60 y.o.   MRN: [id number]  David Hall. is a 60 y.o. male presenting on 07/23/2016 for Conjunctivitis (Right side onset week)  History provided by patient and caregiver / friend.  HPI   Right Eye Conjunctivitis - Reports symptoms started within past 1 week with Right eye itching, redness, and when woke up would have some thick crusty discharged matted together, used warm compress occasionally with relief. No eye drops tried. No known sick contacts. - No history of prior allergies. No recent history of similar problem. - Admits mild right blurry vision, wears glasses, no contacts - Denies any other URI symptoms, fever/chills, sweats, cough, congestion  Severe COPD with emphysema, Chronic Respiratory failure, on supplemental O2 - Reports that he is followed by Pulmonology, treated with supplemental O2, and he is benefiting from oxygen therapy, in past has used 2L, states he does currently use 3L during day with activities and overnight, medical supply company is [facility], he has oxygen tank, but now requesting portable oxygen concentrator, stated that they have this in supply and just need doctors rx. - No recent COPD worsening or exacerbation  Social History  Substance Use Topics  . Smoking status: Former Smoker    Packs/day: 0.00    Years: 45.00    Types: Cigarettes  . Smokeless tobacco: Current User     Comment: smoking 10-15 cigs daily   . Alcohol use No    Review of Systems Per HPI unless specifically indicated above     Objective:    BP (!) 107/57   Pulse (!) 55   Temp 97.3 F (36.3 C) (Oral)   Resp 16   Ht '5\' 10"'$  (1.778 m)   Wt 175 lb (79.4 kg)   SpO2 98%   BMI 25.11 kg/m   Wt Readings from Last 3 Encounters:  07/23/16 175 lb (79.4 kg)  03/23/16 182 lb 6.4 oz (82.7 kg)  03/11/16 181 lb 9.6 oz (82.4 kg)    Physical Exam  Constitutional: He is oriented to person,  place, and time. He appears well-developed and well-nourished. No distress.  Well-appearing, comfortable, cooperative  HENT:  Head: Normocephalic and atraumatic.  Mouth/Throat: Oropharynx is clear and moist.  Oropharynx clear without erythema, exudates, edema or asymmetry.  Eyes: EOM are normal. Pupils are equal, round, and reactive to light. Right eye exhibits discharge (dried crusted discharge on eyelids laterally). Left eye exhibits no discharge.  Right Eye Mild conjunctival scleral injection non focal, no obvious active discharge currently, no purulent drainage. - Periorbital skin without edema or erythema  Left eye appears normal.  Neck: Normal range of motion. Neck supple.  Cardiovascular: Normal rate and intact distal pulses.   Pulmonary/Chest: Effort normal. No respiratory distress. He has no wheezes. He has no rales.  Mild diffuse reduced air movement, but otherwise clear without wheezing or coarse breath sounds.  Lymphadenopathy:    He has no cervical adenopathy.  Neurological: He is alert and oriented to person, place, and time.  Skin: Skin is warm and dry. He is not diaphoretic.  Psychiatric: His behavior is normal.  Nursing note and vitals reviewed.   I have personally reviewed the following lab results from 12/2015.  Results for orders placed or performed in visit on 12/10/15  Comprehensive metabolic panel  Result Value Ref Range   Glucose 71 65 - 99 mg/dL   BUN 11 6 - 24  mg/dL   Creatinine, Ser 1.02 0.76 - 1.27 mg/dL   GFR calc non Af Amer 80 >59 mL/min/1.73   GFR calc Af Amer 93 >59 mL/min/1.73   BUN/Creatinine Ratio 11 9 - 20   Sodium 138 134 - 144 mmol/L   Potassium 4.7 3.5 - 5.2 mmol/L   Chloride 95 (L) 96 - 106 mmol/L   CO2 25 18 - 29 mmol/L   Calcium 8.7 8.7 - 10.2 mg/dL   Total Protein 6.5 6.0 - 8.5 g/dL   Albumin 3.6 3.5 - 5.5 g/dL   Globulin, Total 2.9 1.5 - 4.5 g/dL   Albumin/Globulin Ratio 1.2 1.2 - 2.2   Bilirubin Total <0.2 0.0 - 1.2 mg/dL    Alkaline Phosphatase 64 39 - 117 IU/L   AST 10 0 - 40 IU/L   ALT 9 0 - 44 IU/L  Lipid Profile  Result Value Ref Range   Cholesterol, Total 128 100 - 199 mg/dL   Triglycerides 136 0 - 149 mg/dL   HDL 36 (L) >39 mg/dL   VLDL Cholesterol Cal 27 5 - 40 mg/dL   LDL Calculated 65 0 - 99 mg/dL   Chol/HDL Ratio 3.6 0.0 - 5.0 ratio units      Assessment & Plan:   Problem List Items Addressed This Visit    On home oxygen therapy    Stable, chronic hypercapnic respiratory failure in setting of COPD, on supplemental O2 and benefiting from therapy - Request order for portable oxygen concentrator  Plan: 1. Order placed in Epic for DME home oxygen, comments portable oxygen concentrator, continuous 2-3L overnight, and with activity      Relevant Orders   For home use only DME oxygen   COPD (chronic obstructive pulmonary disease) with emphysema (HCC)    Stable, chronic hypercapnic respiratory failure in setting of COPD, on supplemental O2 - Request order for portable oxygen concentrator  Plan: 1. Order placed in Epic for DME home oxygen, comments portable oxygen concentrator, continuous 2-3L overnight, and with activity      Relevant Orders   For home use only DME oxygen   Chronic respiratory failure (HCC)    Stable, chronic hypercapnic respiratory failure in setting of COPD, on supplemental O2 - Request order for portable oxygen concentrator - At rest O2 98% on RA  Plan: 1. Order placed in Epic for DME home oxygen, comments portable oxygen concentrator, continuous 2-3L overnight, and with activity      Relevant Orders   For home use only DME oxygen    Other Visit Diagnoses    Acute conjunctivitis of right eye, unspecified acute conjunctivitis type    -  Primary  Gradually improving acute R conjunctivitis for past 1 week, mild scleral/conjunctival injection without discharge currently (but dried crusting). Suspected viral vs bacterial etiology, no URI symptoms. - No evidence of  complication, foreign body, or extending eyelid / pre-septal cellulitis - inadequate conservative treatment at home  Plan: 1. Reassurance, most likely self limited, but provide empiric coverage with Polytrim antibiotic eye drops 1 drop in R eye every 4 hours for 7-10 days 2. Start moist warm compresses over Right eyelid 10-15 min at a time, up to 4-6 times daily until resolution 3. Frequent hand washing, avoid rubbing / scratching eye 4. Strict return precautions for spreading infection 5. Follow-up 1-2 weeks as needed     Relevant Medications   trimethoprim-polymyxin b (POLYTRIM) ophthalmic solution   Needs flu shot       Relevant Orders  Flu Vaccine QUAD 36+ mos IM (Completed)      Meds ordered this encounter  Medications  .       . trimethoprim-polymyxin b (POLYTRIM) ophthalmic solution    Sig: Place 1 drop into the right eye every 4 (four) hours. For 7-10 days    Dispense:  10 mL    Refill:  0      Follow up plan: Return if symptoms worsen or fail to improve, for conjunctivitis.  [name], [facility] 07/23/2016, 12:56 PM

## 2016-07-23 NOTE — Assessment & Plan Note (Signed)
Stable, chronic hypercapnic respiratory failure in setting of COPD, on supplemental O2 - Request order for portable oxygen concentrator  Plan: 1. Order placed in Epic for DME home oxygen, comments portable oxygen concentrator, continuous 2-3L overnight, and with activity

## 2016-07-23 NOTE — Assessment & Plan Note (Addendum)
Stable, chronic hypercapnic respiratory failure in setting of COPD, on supplemental O2 - Request order for portable oxygen concentrator - At rest O2 98% on RA  Plan: 1. Order placed in Epic for DME home oxygen, comments portable oxygen concentrator, continuous 2-3L overnight, and with activity

## 2016-08-05 ENCOUNTER — Other Ambulatory Visit: Payer: Self-pay | Admitting: *Deleted

## 2016-08-05 MED ORDER — FLUTICASONE-SALMETEROL 250-50 MCG/DOSE IN AEPB: INHALATION_SPRAY | RESPIRATORY_TRACT | 3 refills | Status: DC

## 2016-08-27 ENCOUNTER — Other Ambulatory Visit: Payer: Self-pay | Admitting: *Deleted

## 2016-08-27 DIAGNOSIS — J438 Other emphysema: Secondary | ICD-10-CM

## 2016-09-06 ENCOUNTER — Telehealth: Payer: Self-pay | Admitting: Family Medicine

## 2016-09-06 DIAGNOSIS — E785 Hyperlipidemia, unspecified: Secondary | ICD-10-CM

## 2016-09-06 MED ORDER — ATORVASTATIN CALCIUM 40 MG PO TABS: 40.0000 mg | ORAL_TABLET | Freq: Every day | ORAL | 3 refills | Status: DC

## 2016-09-06 NOTE — Telephone Encounter (Signed)
Refilled Atorvastatin '40mg'$  daily, #90, +3 refills sent to [facility].  [name], [facility] 09/06/2016, 5:23 PM

## 2016-09-06 NOTE — Telephone Encounter (Signed)
Patient requesting refill of atorvastatin.

## 2016-09-07 ENCOUNTER — Other Ambulatory Visit: Payer: Self-pay | Admitting: Family Medicine

## 2016-09-07 DIAGNOSIS — I10 Essential (primary) hypertension: Secondary | ICD-10-CM

## 2016-09-07 MED ORDER — HYDROCHLOROTHIAZIDE 12.5 MG PO TABS: 12.5000 mg | ORAL_TABLET | Freq: Every day | ORAL | 3 refills | Status: DC

## 2016-09-07 MED ORDER — LISINOPRIL 40 MG PO TABS: 40.0000 mg | ORAL_TABLET | Freq: Every day | ORAL | 3 refills | Status: DC

## 2016-09-07 MED ORDER — ISOSORBIDE MONONITRATE ER 30 MG PO TB24: 30.0000 mg | ORAL_TABLET | Freq: Every day | ORAL | 3 refills | Status: DC

## 2016-09-24 ENCOUNTER — Ambulatory Visit: Admission: RE | Admit: 2016-09-24 | Payer: Medicaid Other | Source: Ambulatory Visit

## 2016-09-24 ENCOUNTER — Ambulatory Visit: Payer: Medicare Other

## 2016-09-30 ENCOUNTER — Ambulatory Visit: Payer: Medicare Other | Attending: Internal Medicine

## 2016-09-30 DIAGNOSIS — J449 Chronic obstructive pulmonary disease, unspecified: Secondary | ICD-10-CM | POA: Diagnosis not present

## 2016-09-30 DIAGNOSIS — J438 Other emphysema: Secondary | ICD-10-CM

## 2016-09-30 DIAGNOSIS — F17200 Nicotine dependence, unspecified, uncomplicated: Secondary | ICD-10-CM | POA: Insufficient documentation

## 2016-09-30 MED ORDER — ALBUTEROL SULFATE (2.5 MG/3ML) 0.083% IN NEBU
2.5000 mg | INHALATION_SOLUTION | Freq: Once | RESPIRATORY_TRACT | Status: AC
  Administered 2016-09-30: 2.5 mg via RESPIRATORY_TRACT
  Filled 2016-09-30: qty 3

## 2016-10-04 ENCOUNTER — Ambulatory Visit (INDEPENDENT_AMBULATORY_CARE_PROVIDER_SITE_OTHER): Payer: Medicare Other | Admitting: Pulmonary Disease

## 2016-10-04 ENCOUNTER — Encounter: Payer: Self-pay | Admitting: Pulmonary Disease

## 2016-10-04 VITALS — BP 132/80 | HR 60 | Ht 70.0 in | Wt 165.0 lb

## 2016-10-04 DIAGNOSIS — R938 Abnormal findings on diagnostic imaging of other specified body structures: Secondary | ICD-10-CM

## 2016-10-04 DIAGNOSIS — F17200 Nicotine dependence, unspecified, uncomplicated: Secondary | ICD-10-CM

## 2016-10-04 DIAGNOSIS — J449 Chronic obstructive pulmonary disease, unspecified: Secondary | ICD-10-CM | POA: Diagnosis not present

## 2016-10-04 DIAGNOSIS — R9389 Abnormal findings on diagnostic imaging of other specified body structures: Secondary | ICD-10-CM

## 2016-10-04 DIAGNOSIS — M6281 Muscle weakness (generalized): Secondary | ICD-10-CM | POA: Diagnosis not present

## 2016-10-04 NOTE — Patient Instructions (Signed)
You must quit smoking if we are to get any control over your COPD and breathing problems Continue Advair Continue albuterol as needed

## 2016-10-04 NOTE — Progress Notes (Signed)
PULMONARY OFFICE FOLLOW UP NOTE  PROBLEMS:  Severe COPD Hemiplegia after CVA Smoker Abnormal CT chest  DATA: Spirometry 04/02/15: Severe obstruction. FEV1 pre 0.90 L (24% pred) > post 1.14 L (31%pred), FVC 2.96 L (62% pred) CT chest 05/111/17: Interval resolution of previously described larger nodules within the right upper lobe and lingula. Persistent subpleural ground-glass nodularity within the right upper and right lower lobes which is nonspecific in etiology may represent sequelae of prior infectious or inflammatory process  INTERVAL HISTORY: Pt of DR, last seen 03/23/16. No major events  SUBJ: This appointment was originally scheduled wit Dr Ashby Dawes. He continues to smoke one PPD. He expresses no insight re: the damage this is causing and no interest in quitting. He continues to have DOE but is as limited by neurological impairment as by dyspnea. He has chronic daily, mostly nonproductive cough. Denies CP, fever, purulent sputum, hemoptysis, LE edema and calf tenderness.   OBJ: Vitals:   10/04/16 0938  BP: 132/80  Pulse: 60  SpO2: 92%  Weight: 165 lb (74.8 kg)  Height: '5\' 10"'$  (1.778 m)  RA  Chronically ill appearing, somewhat disheveled The entire room smells of cigarette smoke Heavily bearded, HEENT WNL JVD cannot be visualized BS markedly diminished without wheezes Reg, no wheezes NABS, soft No C/C/E. Severe tobacco stains on digits of L hand R hemiplegia   DATA: No new labs or CXR  IMPRESSION: 1) Recalcitrant smoker 2) Very severe COPD by spirometry in 2016 3) Abnormal CT of the chest - I have reviewed prior CT. The appearance is not suggestive malignancy and, given his very severe COPD and other co-morbidities, he would not be a candidate for aggressive therapy if a lung malignancy were to be diagnosed. Therefore, I doubt there is any benefit   PLAN: 1) Counseled re: smoking cessation. I made it very clear that there is nothing that we can do for him in  the face of ongoing heavy smoking 2) Continue Advair and PRN albuterol 3) ROV 6 months with DR   Merton Border, MD PCCM service Mobile [phone] Pager [phone] 10/04/2016

## 2016-11-01 DIAGNOSIS — M6281 Muscle weakness (generalized): Secondary | ICD-10-CM | POA: Diagnosis not present

## 2016-11-01 DIAGNOSIS — J449 Chronic obstructive pulmonary disease, unspecified: Secondary | ICD-10-CM | POA: Diagnosis not present

## 2016-12-02 DIAGNOSIS — M6281 Muscle weakness (generalized): Secondary | ICD-10-CM | POA: Diagnosis not present

## 2016-12-02 DIAGNOSIS — J449 Chronic obstructive pulmonary disease, unspecified: Secondary | ICD-10-CM | POA: Diagnosis not present

## 2016-12-06 ENCOUNTER — Other Ambulatory Visit: Payer: Self-pay

## 2016-12-06 DIAGNOSIS — K219 Gastro-esophageal reflux disease without esophagitis: Secondary | ICD-10-CM

## 2016-12-06 MED ORDER — OMEPRAZOLE 20 MG PO CPDR: 20.0000 mg | DELAYED_RELEASE_CAPSULE | Freq: Two times a day (BID) | ORAL | 0 refills | Status: DC

## 2016-12-06 MED ORDER — RANITIDINE HCL 150 MG PO TABS: 150.0000 mg | ORAL_TABLET | Freq: Every day | ORAL | 0 refills | Status: DC

## 2016-12-06 MED ORDER — METOPROLOL TARTRATE 50 MG PO TABS: 50.0000 mg | ORAL_TABLET | Freq: Two times a day (BID) | ORAL | 3 refills | Status: DC

## 2016-12-06 NOTE — Telephone Encounter (Signed)
Pharmacy requesting refill Last ov 07/23/16  Please review. Thank you. sd

## 2016-12-10 ENCOUNTER — Other Ambulatory Visit: Payer: Self-pay

## 2016-12-10 DIAGNOSIS — I10 Essential (primary) hypertension: Secondary | ICD-10-CM

## 2016-12-10 MED ORDER — HYDRALAZINE HCL 25 MG PO TABS: 25.0000 mg | ORAL_TABLET | Freq: Three times a day (TID) | ORAL | 3 refills | Status: DC

## 2016-12-10 NOTE — Telephone Encounter (Signed)
Last ov  07/23/16 Last filled 07/25/15

## 2017-01-11 ENCOUNTER — Encounter: Payer: Self-pay | Admitting: Family Medicine

## 2017-01-11 ENCOUNTER — Ambulatory Visit (INDEPENDENT_AMBULATORY_CARE_PROVIDER_SITE_OTHER): Payer: Medicare Other | Admitting: Family Medicine

## 2017-01-11 VITALS — BP 101/68 | HR 50 | Temp 98.6°F | Resp 16 | Ht 70.0 in | Wt 157.0 lb

## 2017-01-11 DIAGNOSIS — I6930 Unspecified sequelae of cerebral infarction: Secondary | ICD-10-CM

## 2017-01-11 DIAGNOSIS — I129 Hypertensive chronic kidney disease with stage 1 through stage 4 chronic kidney disease, or unspecified chronic kidney disease: Secondary | ICD-10-CM

## 2017-01-11 DIAGNOSIS — N182 Chronic kidney disease, stage 2 (mild): Secondary | ICD-10-CM

## 2017-01-11 DIAGNOSIS — F17200 Nicotine dependence, unspecified, uncomplicated: Secondary | ICD-10-CM

## 2017-01-11 DIAGNOSIS — R296 Repeated falls: Secondary | ICD-10-CM | POA: Diagnosis not present

## 2017-01-11 DIAGNOSIS — J432 Centrilobular emphysema: Secondary | ICD-10-CM

## 2017-01-11 DIAGNOSIS — I69359 Hemiplegia and hemiparesis following cerebral infarction affecting unspecified side: Secondary | ICD-10-CM | POA: Diagnosis not present

## 2017-01-11 DIAGNOSIS — J9612 Chronic respiratory failure with hypercapnia: Secondary | ICD-10-CM | POA: Diagnosis not present

## 2017-01-11 MED ORDER — AMLODIPINE-VALSARTAN-HCTZ 10-160-12.5 MG PO TABS: ORAL_TABLET | ORAL | 3 refills | Status: DC

## 2017-01-11 NOTE — Progress Notes (Signed)
Subjective:    Patient ID: David Hall., male    DOB: Dec 28, 1955, 61 y.o.   MRN: [id number]  David Hall. is a 61 y.o. male presenting on 01/11/2017 for Leg Pain (need PT )  History provided by patient and also friend / primary caregiver, David Hall.  HPI   CHRONIC HTN: Reports concern with too many BP medications, asking about switching to combination pill for ease of use. Does not routinely check BP at home has had this monitored by nursing before. Current Meds - Lisinopril 40mg  daily, Amlodipine 10mg  daily, HCTZ 12.5mg  daily, Hydralazine 25mg  TID, Metoprolol 50mg  BID    Reports good compliance (except he does not know which names of pills he takes, he only knows by color/shape/size), took meds today. Tolerating well, w/o complaints. Lifestyle - limited exercise and activity, see below Denies CP, dyspnea, HA, edema, dizziness / lightheadedness  H/o CVA w/ residual deficit (Right sided weakness) / Chronic Debilitation and Generalized Weakness / Recurrent Falls - He has complex history of multiple medical problems with prior CVA with chronic residual R sided weakness upper and lower extremity, he has completed therapy for this in past with improvement then gradual worsening again, states he had [facility] PT 1 year ago, then he was able to resume his electric wheelchair and has been less active over past 6-12 months, now he seems significantly limited, can only walk < 20 steps daily due to dyspnea and weakness, has had recurrent falls, asking about new referral to [facility] PT - Also has yearly home check nurse, stated that he needs vaccinations - Additionally he has chronic pain multiple etiology with osteoarthritis, DJD, he is on Cymbalta 90mg  daily among other medications, and has not been on opiates, he is asking about pain medicine, but understands his chronic conditions high risk for these meds - Denies new acute injury, worsening weakness, numbness, tingling  End Stage COPD /  Chronic Respiratory Failure - Followed by [facility], last seen 09/2016 - Has supplemental O2 3L wears it during day PRN and at night - Takes inhalers as prescribed - Denies any recent worsening breath or COPD flare  Health Maintenance: - Due for TDap, will need to get this at health dept due to ins coverage  Social History  Substance Use Topics  . Smoking status: Current Every Day Smoker    Packs/day: 0.50    Years: 45.00    Types: Cigarettes  . Smokeless tobacco: Current User     Comment: smoking 10-15 cigs daily   . Alcohol use No    Review of Systems Per HPI unless specifically indicated above     Objective:    BP 101/68   Pulse (!) 50   Temp 98.6 F (37 C) (Oral)   Resp 16   Ht 5\' 10"  (1.778 m)   Wt 157 lb (71.2 kg)   SpO2 95%   BMI 22.53 kg/m   Wt Readings from Last 3 Encounters:  01/11/17 157 lb (71.2 kg)  10/04/16 165 lb (74.8 kg)  07/23/16 175 lb (79.4 kg)    Physical Exam  Constitutional: He is oriented to person, place, and time. He appears well-developed and well-nourished. No distress.  Chronically ill-appearing, mostly comfortable, cooperative, has cane for ambulation, thin appearance  HENT:  Head: Normocephalic and atraumatic.  Mouth/Throat: Oropharynx is clear and moist.  Frontal / maxillary sinuses non-tender. Nares patent with thicker nasal congestion without purulence.  Poor dentition. Oropharynx clear without erythema, exudates, edema  or asymmetry.  Not wearing any supplemental oxygen.  Eyes: Conjunctivae are normal. Right eye exhibits no discharge. Left eye exhibits no discharge.  Neck: Normal range of motion. Neck supple.  Cardiovascular: Normal rate, regular rhythm, normal heart sounds and intact distal pulses.   No murmur heard. Pulmonary/Chest: No respiratory distress. He has no wheezes. He has no rales.  Reduced air movement diffusely, some reduced respiratory effort. Occasional cough.  Musculoskeletal: He exhibits no edema.    Lymphadenopathy:    He has no cervical adenopathy.  Neurological: He is alert and oriented to person, place, and time.  Skin: Skin is warm and dry. No rash noted. He is not diaphoretic. No erythema.  Psychiatric: He has a normal mood and affect. His behavior is normal.  Appears poorly groomed, good eye contact, normal speech and thoughts  Nursing note and vitals reviewed.  Results for orders placed or performed in visit on 12/10/15  Comprehensive metabolic panel  Result Value Ref Range   Glucose 71 65 - 99 mg/dL   BUN 11 6 - 24 mg/dL   Creatinine, Ser 1.02 0.76 - 1.27 mg/dL   GFR calc non Af Amer 80 >59 mL/min/1.73   GFR calc Af Amer 93 >59 mL/min/1.73   BUN/Creatinine Ratio 11 9 - 20   Sodium 138 134 - 144 mmol/L   Potassium 4.7 3.5 - 5.2 mmol/L   Chloride 95 (L) 96 - 106 mmol/L   CO2 25 18 - 29 mmol/L   Calcium 8.7 8.7 - 10.2 mg/dL   Total Protein 6.5 6.0 - 8.5 g/dL   Albumin 3.6 3.5 - 5.5 g/dL   Globulin, Total 2.9 1.5 - 4.5 g/dL   Albumin/Globulin Ratio 1.2 1.2 - 2.2   Bilirubin Total <0.2 0.0 - 1.2 mg/dL   Alkaline Phosphatase 64 39 - 117 IU/L   AST 10 0 - 40 IU/L   ALT 9 0 - 44 IU/L  Lipid Profile  Result Value Ref Range   Cholesterol, Total 128 100 - 199 mg/dL   Triglycerides 136 0 - 149 mg/dL   HDL 36 (L) >39 mg/dL   VLDL Cholesterol Cal 27 5 - 40 mg/dL   LDL Calculated 65 0 - 99 mg/dL   Chol/HDL Ratio 3.6 0.0 - 5.0 ratio units      Assessment & Plan:   Problem List Items Addressed This Visit    Right 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) ([place]) (Chronic)   Relevant Orders   Ambulatory referral to Home Health   Recurrent falls    High fall risk with s/p CVA R sided hemiparesis, end stage COPD with dyspnea on exertion Referral to [facility]      Relevant Orders   Ambulatory referral to [place]   History of CVA with residual deficit    Chronic problem, with interval improvement in weakness 1 year ago from [facility]  PT, but then gradual decline again mostly with limited activity - Continue full dose ASA 325mg  daily - Referral to [facility] for PT, RN and other support given significant weakness and debilitation      Relevant Orders   Ambulatory referral to Home Health   COPD (chronic obstructive pulmonary disease) with emphysema (HCC)    End Stage COPD with complication chronic hypercapnic respiratory failure on supplemental O2 3L PRN activity and nightly - Had been followed by [facility] for med supplies/oxygen - Followed by [facility] [facility]  Plan: 1. No changes acutely today - reviewed concern with patient and  family about his chronic decline in status, very limited function and debilitated due to poor respiratory status and weakness s/p CVA - referral to home health [facility], with Pulmonology assisting with primary management, and patient may meet criteria to transition care to hospice in future      Relevant Orders   Ambulatory referral to [place]   Compulsive tobacco user syndrome    Active smoker, despite End Stage COPD Prior counseling including from Pulm Not ready to quit      Chronic respiratory failure ([place])    End Stage COPD with complication chronic hypercapnic respiratory failure on supplemental O2 3L PRN activity and nightly - Had been followed by [facility] for med supplies/oxygen - Followed by [place] [facility]  Plan: 1. No changes acutely today - reviewed concern with patient and family about his chronic decline in status, very limited function and debilitated due to poor respiratory status and weakness s/p CVA - referral to home health [facility], with Pulmonology assisting with primary management, and patient may meet criteria to transition care to hospice in future      Benign hypertension with CKD (chronic kidney disease), stage II - Primary    Well-controlled HTN - Home BP readings appropriate previously, none recently  CKD II-III, has had fluctuating Cr   Plan:  1. Adjust  med regimen to simplify BP meds - consolidate meds - start combo pill Amlodipine-Valsartan-HCTZ 10-160-12.5mg  once daily #90 given (discontinue separate meds, Amlodipine, Lisinopril, HCTZ) 2. Continue Hydralazine 25mg  TID, Metoprolol 25mg  BID 3. Encourage improved lifestyle - low sodium diet, regular exercise - limited by ambulation and chronic debilitation/weakness 4. Start monitor BP outside office, bring readings to next visit, if persistently >140/90 or new symptoms notify office sooner 5. Follow-up q 3 months - sooner as needed, will refer to [facility] for more regular monitoring med admin      Relevant Medications   Amlodipine-Valsartan-HCTZ 10-160-12.5 MG TABS      Meds ordered this encounter  Medications  . Amlodipine-Valsartan-HCTZ 10-160-12.5 MG TABS    Sig: Take 1 pill daily in morning.    Dispense:  90 tablet    Refill:  3    Replace Amlodipine, Lisinopril, HCTZ      Follow up plan: Return in about 3 months (around 04/13/2017) for HTN, COPD.  [name], [facility] 01/12/2017, 12:19 AM

## 2017-01-11 NOTE — Patient Instructions (Addendum)
Thank you for coming to the clinic today.  1. For Blood Pressure  Consolidated into ONE Pill 3 different medicines - STOP taking Lisinopril 40 - STOP taking Amlodipine 10 - STOP taking Hydrochlorothiazide 12.5mg   START taking - combo pill Amlodpine-Valsartan-HCTZ (10-160-12.5mg )  2.  Referral to [facility] for Physical Therapy and Home Health Nurse evaluation  As discussed, in future, if breathing continues to worsen or difficulty with function, can consider Palliative Care through [facility]  Please schedule a Follow-up Appointment to: Return in about 3 months (around 04/13/2017) for HTN, COPD.  If you have any other questions or concerns, please feel free to call the clinic or send a message through [place]. You may also schedule an earlier appointment if necessary.  [name], DO [facility]

## 2017-01-12 NOTE — Assessment & Plan Note (Signed)
End Stage COPD with complication chronic hypercapnic respiratory failure on supplemental O2 3L PRN activity and nightly - Had been followed by [facility] for med supplies/oxygen - Followed by [facility]  Plan: 1. No changes acutely today - reviewed concern with patient and family about his chronic decline in status, very limited function and debilitated due to poor respiratory status and weakness s/p CVA - referral to home health [facility], with Pulmonology assisting with primary management, and patient may meet criteria to transition care to hospice in future

## 2017-01-12 NOTE — Assessment & Plan Note (Signed)
High fall risk with s/p CVA R sided hemiparesis, end stage COPD with dyspnea on exertion Referral to [facility]

## 2017-01-12 NOTE — Assessment & Plan Note (Addendum)
Well-controlled HTN - Home BP readings appropriate previously, none recently  CKD II-III, has had fluctuating Cr   Plan:  1. Adjust med regimen to simplify BP meds - consolidate meds - start combo pill Amlodipine-Valsartan-HCTZ 10-160-12.5mg  once daily #90 given (discontinue separate meds, Amlodipine, Lisinopril, HCTZ) 2. Continue Hydralazine 25mg  TID, Metoprolol 25mg  BID 3. Encourage improved lifestyle - low sodium diet, regular exercise - limited by ambulation and chronic debilitation/weakness 4. Start monitor BP outside office, bring readings to next visit, if persistently >140/90 or new symptoms notify office sooner 5. Follow-up q 3 months - sooner as needed, will refer to [facility] for more regular monitoring med admin

## 2017-01-12 NOTE — Assessment & Plan Note (Signed)
Active smoker, despite End Stage COPD Prior counseling including from Pulm Not ready to quit

## 2017-01-12 NOTE — Assessment & Plan Note (Signed)
End Stage COPD with complication chronic hypercapnic respiratory failure on supplemental O2 3L PRN activity and nightly - Had been followed by [facility] for med supplies/oxygen - Followed by [facility]  Plan: 1. No changes acutely today - reviewed concern with patient and family about his chronic decline in status, very limited function and debilitated due to poor respiratory status and weakness s/p CVA - referral to home health [facility], with Pulmonology assisting with primary management, and patient may meet criteria to transition care to hospice in future

## 2017-01-12 NOTE — Assessment & Plan Note (Signed)
Chronic problem, with interval improvement in weakness 1 year ago from [facility] PT, but then gradual decline again mostly with limited activity - Continue full dose ASA 325mg  daily - Referral to [facility] for PT, RN and other support given significant weakness and debilitation

## 2017-01-31 ENCOUNTER — Telehealth: Payer: Self-pay

## 2017-01-31 NOTE — Telephone Encounter (Signed)
I received a phone call from a homehealth nurse from [employer]. She called stating that the pt heart rate is rate 48 and irregular w/ no SOB or chest pains. She also stated that the pt had a fall over the weekend and reports some tenderness in his ribcage. She recommended to the pt that he go to the ER, but he declined. She said he stated that he will go later when his caregiver gets off of work.    I called the pt to f/u with him and he informed me that he used a pulse ox and his heart rate is back up to 60. He reports no SOB, Chest pain, lightheadedness, or dizziness. I still recommeded that he go to the ER so they can evaluate him. He informed me he will go when his rides get there.

## 2017-02-04 ENCOUNTER — Other Ambulatory Visit: Payer: Self-pay

## 2017-02-04 DIAGNOSIS — G890 Central pain syndrome: Secondary | ICD-10-CM

## 2017-02-04 MED ORDER — GABAPENTIN 300 MG PO CAPS: ORAL_CAPSULE | ORAL | 0 refills | Status: DC

## 2017-02-07 ENCOUNTER — Telehealth: Payer: Self-pay | Admitting: Family Medicine

## 2017-02-07 NOTE — Telephone Encounter (Signed)
Called Sherlynn Stalls back, verbal order given for continued home health. 1 visit for 1 week, then 2 visits for 2 weeks, total of 3 visits.  [name], DO [facility] 02/07/2017, 5:02 PM

## 2017-02-07 NOTE — Telephone Encounter (Signed)
Sherlynn Stalls called from [facility] is requesting a verbal order to see the patient for a home visit once on week one. Twice week  (2)Two and week (3)three. Please advise

## 2017-02-07 NOTE — Telephone Encounter (Signed)
David Hall  With  [employer] called [phone] need verbal:  For 1 week 1 for , 2 week 2 for plan of  Care

## 2017-02-17 ENCOUNTER — Ambulatory Visit (INDEPENDENT_AMBULATORY_CARE_PROVIDER_SITE_OTHER): Payer: Medicare Other | Admitting: Family Medicine

## 2017-02-17 ENCOUNTER — Encounter: Payer: Self-pay | Admitting: Family Medicine

## 2017-02-17 ENCOUNTER — Ambulatory Visit
Admission: RE | Admit: 2017-02-17 | Discharge: 2017-02-17 | Disposition: A | Discharge status: Home / Self Care | Payer: Medicare Other | Source: Ambulatory Visit | Attending: Family Medicine | Admitting: Family Medicine

## 2017-02-17 VITALS — BP 96/42 | HR 57 | Temp 98.2°F | Ht 70.0 in | Wt 154.2 lb

## 2017-02-17 DIAGNOSIS — W19XXXA Unspecified fall, initial encounter: Secondary | ICD-10-CM | POA: Insufficient documentation

## 2017-02-17 DIAGNOSIS — M545 Low back pain, unspecified: Secondary | ICD-10-CM

## 2017-02-17 DIAGNOSIS — R001 Bradycardia, unspecified: Secondary | ICD-10-CM | POA: Diagnosis not present

## 2017-02-17 MED ORDER — METOPROLOL TARTRATE 50 MG PO TABS: 25.0000 mg | ORAL_TABLET | Freq: Two times a day (BID) | ORAL | 3 refills | Status: DC

## 2017-02-17 NOTE — Progress Notes (Addendum)
Subjective:    Patient ID: David Hall., male    DOB: [birth year], 61 y.o.   MRN: [id number]  David Hall. is a 61 y.o. male presenting on 02/17/2017 for Fall (x 1.5 week ago, pt fell on his back and injured his back. Now complains of constant lower back pain)  History provided by patient and friend/primary caregiver, Sandrea Hughs.  HPI   Acute LOW BACK PAIN / FALL - Reports symptoms started about 1.5 week ago with inciting injury fall at home, was urinating and feel backwards hit wheelchair and floor, did not hit head, denies any LOC. He did not seek medical treatment at ED or office at time of injury. Had some mild bruising on left side of ribs where injury and reportedly low back, has since healed. - Today seems to be persistent to worsening. Describes pain as aching mostly constant, severity 9/10 with intermittent worsening due to position changes ambulation, improved with rest. No pain radiating to legs. Admits some associated tingling in buttocks. - Taking Advil / Tylenol x 2 per dose q 6 hours without relief. Also takes Gabapentin regularly for neuropathy. - No known history of lumbar OA/DJD. No prior back surgery. No history of osteoporosis, no prior dexa but normal mineralization on prior x-ray - Admits difficulty sleeping due to pain - Denies any fevers/chills, numbness, tingling, weakness, loss of control bladder/bowel incontinence or retention, unintentional wt loss, night sweats  Bradyacrdia / Low BP without hypotension / History of reported irregular heart beats - Received notice from [facility] that patient has had some low HR readings 40-50s recently, and concern with irregular heart beats. No known history of this documented. Prior EKG 07/2015 unremarkable with sinus rhythm. He was followed by Dr Clayborn Bigness at [facility], last visit 07/2015 - he takes Metoprolol 50mg  BID, Hydralazine 25mg  TID, Amlodipine-Valsartan-HCTZ 10-160-12.5mg  daily - denies any  chest pain, palpitations   Social History  Substance Use Topics  . Smoking status: Current Every Day Smoker    Packs/day: 0.50    Years: 45.00    Types: Cigarettes  . Smokeless tobacco: Current User     Comment: smoking 10-15 cigs daily   . Alcohol use No    Review of Systems Per HPI unless specifically indicated above     Objective:    BP (!) 96/42 (BP Location: Left Arm, Patient Position: Sitting, Cuff Size: Normal)   Pulse (!) 57   Temp 98.2 F (36.8 C) (Oral)   Ht 5\' 10"  (1.778 m)   Wt 154 lb 3.2 oz (69.9 kg)   SpO2 93%   BMI 22.13 kg/m   Wt Readings from Last 3 Encounters:  02/17/17 154 lb 3.2 oz (69.9 kg)  01/11/17 157 lb (71.2 kg)  10/04/16 165 lb (74.8 kg)    Physical Exam  Constitutional: He is oriented to person, place, and time. He appears well-developed and well-nourished. No distress.  Chronically ill-appearing, mostly comfortable, cooperative, has cane for ambulation, thin appearance  HENT:  Head: Normocephalic and atraumatic.  Mouth/Throat: Oropharynx is clear and moist.  Not wearing any supplemental oxygen.  Cardiovascular: Regular rhythm, normal heart sounds and intact distal pulses.   No murmur heard. Mild bradycardia. No ectopy or irregular rhythm heard or palpated today.  Pulmonary/Chest: No respiratory distress. He has no wheezes. He has no rales.  Unchanged reduced air movement diffusely, some reduced respiratory effort. Occasional cough.  Musculoskeletal: He exhibits no edema.  Low Back Inspection: Normal appearance, thin body habitus,  no spinal deformity, symmetrical. No ecchymosis or ulceration. Palpation: Mild to moderate tenderness over spinous processes and lower mid sacrum into coccyx. Bilateral lumbar paraspinal muscles mild tender and with hypertonicity/spasm. ROM: Limited active ROM forward flex / back extension, some discomfort L rotation. Uses cane but some difficulty standing from seated. Strength: knee flex/ext 4/5 due to effort  and some discomfort Neurovascular: intact distal sensation to light touch  Neurological: He is alert and oriented to person, place, and time.  Skin: Skin is warm and dry. No rash noted. He is not diaphoretic. No erythema.  Thick dry skin  Psychiatric: He has a normal mood and affect. His behavior is normal.  Appears poorly groomed, good eye contact, normal speech and thoughts  Nursing note and vitals reviewed.  Imaging results pending - Lumbar Spine X-ray and Sacrum/Coccyx X-ray 02/17/17  Results for orders placed or performed in visit on 12/10/15  Comprehensive metabolic panel  Result Value Ref Range   Glucose 71 65 - 99 mg/dL   BUN 11 6 - 24 mg/dL   Creatinine, Ser 1.02 0.76 - 1.27 mg/dL   GFR calc non Af Amer 80 >59 mL/min/1.73   GFR calc Af Amer 93 >59 mL/min/1.73   BUN/Creatinine Ratio 11 9 - 20   Sodium 138 134 - 144 mmol/L   Potassium 4.7 3.5 - 5.2 mmol/L   Chloride 95 (L) 96 - 106 mmol/L   CO2 25 18 - 29 mmol/L   Calcium 8.7 8.7 - 10.2 mg/dL   Total Protein 6.5 6.0 - 8.5 g/dL   Albumin 3.6 3.5 - 5.5 g/dL   Globulin, Total 2.9 1.5 - 4.5 g/dL   Albumin/Globulin Ratio 1.2 1.2 - 2.2   Bilirubin Total <0.2 0.0 - 1.2 mg/dL   Alkaline Phosphatase 64 39 - 117 IU/L   AST 10 0 - 40 IU/L   ALT 9 0 - 44 IU/L  Lipid Profile  Result Value Ref Range   Cholesterol, Total 128 100 - 199 mg/dL   Triglycerides 136 0 - 149 mg/dL   HDL 36 (L) >39 mg/dL   VLDL Cholesterol Cal 27 5 - 40 mg/dL   LDL Calculated 65 0 - 99 mg/dL   Chol/HDL Ratio 3.6 0.0 - 5.0 ratio units      Assessment & Plan:   Problem List Items Addressed This Visit    None    Visit Diagnoses    Acute midline low back pain without sciatica    -  Primary  Acute midline LBP without associated sciatica. Suspect likely muscle spasm and strain vs contusion, possible bone bruise of coccyx, with known fall injury at home. No prior known OA/DJD or spinal disease. - No red flag symptoms. Negative SLR for radiculopathy - Not  responding to conservative therapy  Plan: 1. Check X-ray Lumbar spine and Sacrum/Coccyx today - await results, if fracture will discuss further management, pain control, depending on severity may need Ortho advice 2. Caution with NSAIDs given multiple co-morbidities, but may take OTC for now 3. Caution sedation with other medications with falls. 4. Recommend max dose Tylenol regularly, heating pad, relative rest, avoid heavy lifting and prolonged sitting if provoking pain. Need to change positions frequently. 5. If X-ray negative anticipate trial on Baclofen muscle relaxant for residual spasm and may help sleep, lowest amount of sedation, risks given, may try Flexeril (from family member) tonight to see if helps, notify office, will determine rx tomorrow with results. Considered Tramadol as well. 6. Follow-up within 2 weeks  if not improved or pain worsening - reviewed when to go to hospital ED if uncontrolled pain    Relevant Orders   DG Lumbar Spine Complete   DG Sacrum/Coccyx   Fall, initial encounter       Relevant Orders   DG Lumbar Spine Complete   DG Sacrum/Coccyx   Bradycardia      Concern with persistent readings bradycardia, questionable if orthostatic hypotension contributed to fall, BP readings low normal. - Suspect on too much anti-HTN medications. - Start with HALF dose Metoprolol for now 25mg  BID (cut tabs 50mg  in half) - Monitor BP - His other med is a combo pill unable to reduce dose, unless new rx - Recommend schedule to see Cardiology Dr Clayborn Bigness [facility]) to adjust meds further, otherwise return here, consider EKG testing again    Relevant Medications   metoprolol tartrate (LOPRESSOR) 50 MG tablet      Meds ordered this encounter  Medications  . metoprolol tartrate (LOPRESSOR) 50 MG tablet    Sig: Take 0.5 tablets (25 mg total) by mouth 2 (two) times daily.    Dispense:  180 tablet    Refill:  3    Follow up plan: Return in about 8 weeks (around 04/14/2017).     Additionally completed and signed form for [employer] that patient requires continued energy services due to oxygen concentrator. Not life support, but intermittent daily use. Form to be faxed today.  [name], [facility] 02/17/2017, 5:52 PM

## 2017-02-17 NOTE — Patient Instructions (Addendum)
Thank you for coming to the clinic today.  1 X-rays today, will call tomorrow with results  Try flexeril tonight, if helps, let me know tomorrow  Will likely prescribe Baclofen for muscle relaxant  Start taking Baclofen (Lioresal) 10mg  (muscle relaxant) - start with half (cut) to one whole pill at night as needed for next 1-3 nights (may make you drowsy, caution with driving) see how it affects you, then if tolerated increase to one pill 2 to 3 times a day or (every 8 hours as needed)  We may consider Tramadol rx if needed, you will have to pick this up from our office.  Due to low Bp and low heart rate - CUT METOPROLOL IN HALF - it is a 50mg  tablet, you take twice daily - now cut in half for dose 25mg  TWICE daily  Keep checking the Blood Pressure.  Jobe Gibbon, MD  [place], [place] [address]  [phone]  Please schedule a Follow-up Appointment to: Return in about 8 weeks (around 04/14/2017).  If you have any other questions or concerns, please feel free to call the clinic or send a message through [facility]. You may also schedule an earlier appointment if necessary.  Additionally, you may be receiving a survey about your experience at our clinic within a few days to 1 week by e-mail or mail. We value your feedback.  [name], DO [place]

## 2017-02-18 MED ORDER — CYCLOBENZAPRINE HCL 10 MG PO TABS: 5.0000 mg | ORAL_TABLET | Freq: Two times a day (BID) | ORAL | 2 refills | Status: DC | PRN

## 2017-02-18 NOTE — Addendum Note (Signed)
Addended by: Olin Hauser on: 02/18/2017 01:10 PM   Modules accepted: Orders

## 2017-02-22 ENCOUNTER — Encounter: Payer: Self-pay | Admitting: Family Medicine

## 2017-02-22 ENCOUNTER — Telehealth: Payer: Self-pay | Admitting: Family Medicine

## 2017-02-22 DIAGNOSIS — E876 Hypokalemia: Secondary | ICD-10-CM

## 2017-02-22 LAB — BASIC METABOLIC PANEL
Alkaline Phosphatase: 55
BUN: 12 (ref 4–21)
CO2: 29
Chloride: 93 — AB (ref 98–110)
Creatinine: 1 (ref <=1.3)
Glucose: 61
Magnesium: 2 (ref 1.5–2.5)
Potassium: 3.1 — AB (ref 3.4–5.3)
Sodium: 132 — AB (ref 137–147)
Valproic Acid Lvl: 82.4 (ref 50–100)

## 2017-02-22 LAB — TSH: TSH: 1.51 (ref <=5.90)

## 2017-02-22 LAB — HEPATIC FUNCTION PANEL
ALT: 3 — AB (ref 10–40)
AST: 7 — AB (ref 14–40)
Alkaline Phosphatase: 55 (ref 25–125)
Bilirubin, Total: 0.3

## 2017-02-22 MED ORDER — POTASSIUM CHLORIDE ER 10 MEQ PO TBCR: 10.0000 meq | EXTENDED_RELEASE_TABLET | Freq: Every day | ORAL | 2 refills | Status: DC

## 2017-02-22 NOTE — Telephone Encounter (Signed)
Pt advised.

## 2017-02-22 NOTE — Telephone Encounter (Signed)
Labs were drawn by [facility] on 02/21/17.  Abnormal results: 1. Sodium mild low at 132 2. Potassium low at 3.1 - Recommend starting a daily low dose potassium supplement Kdur 66mEq once daily - sent rx to [place], continue this for next few months until seen in office and we re-check blood work  Other lab results: Depakote (Valproic Acid level) - 82.4, in the normal range, below the toxicity range (>150). As discussed with Home health, I recommend that he schedule an appointment to return to [facility] to see Dr Melrose Nakayama or available provider for managing further Depakote prescription and management for Seizures.  Also normal magnesium 2.0, and TSH 1.5  -----------------------------------------------------------------------------  Please contact patient to review the following (No MyChart Access):  Review low potasium lab result, sent in new rx to take once daily potassium supplement, and please advise patient to schedule an appointment to follow-up with [facility].  [name], [facility] 02/22/2017, 12:08 PM

## 2017-02-28 ENCOUNTER — Telehealth: Payer: Self-pay | Admitting: Family Medicine

## 2017-02-28 NOTE — Telephone Encounter (Signed)
Verbal given 

## 2017-02-28 NOTE — Telephone Encounter (Signed)
Esther with [facility] asked for an order to continue OT for pt's right arm twice a week for 2 weeks.  Her call back number is [phone]

## 2017-03-06 ENCOUNTER — Other Ambulatory Visit: Payer: Self-pay | Admitting: Family Medicine

## 2017-03-06 DIAGNOSIS — K219 Gastro-esophageal reflux disease without esophagitis: Secondary | ICD-10-CM

## 2017-03-09 ENCOUNTER — Telehealth: Payer: Self-pay | Admitting: Family Medicine

## 2017-03-09 NOTE — Telephone Encounter (Signed)
Pamala Hurry with [facility] said pt was going to ER.  Her call back number is [phone]

## 2017-03-09 NOTE — Telephone Encounter (Signed)
As per Dr. Raliegh Ip that would beneficiary for his Sx.

## 2017-03-28 ENCOUNTER — Telehealth: Payer: Self-pay | Admitting: Family Medicine

## 2017-03-28 NOTE — Telephone Encounter (Signed)
Scott with [facility] called [phone] states that pt fell twice today no injury states that he have some weakness.

## 2017-03-28 NOTE — Telephone Encounter (Signed)
Acknowledged. He should continue to work with [employer]. He has known chronic weakness and debilitation. Previously more wheelchair bound. He was recommended for hospice care, but has declined previously.  David Hall, [facility] 03/28/2017, 11:18 AM

## 2017-03-31 ENCOUNTER — Telehealth: Payer: Self-pay | Admitting: Family Medicine

## 2017-03-31 NOTE — Telephone Encounter (Signed)
Called pt to schedule for Annual Wellness Visit with Nurse Health Advisor, Tiffany Hill, my c/b # is [phone]  Kathryn Brown °8/28? °

## 2017-04-06 ENCOUNTER — Telehealth: Payer: Self-pay

## 2017-04-06 ENCOUNTER — Encounter: Payer: Self-pay | Admitting: Emergency Medicine

## 2017-04-06 ENCOUNTER — Emergency Department
Admission: EM | Admit: 2017-04-06 | Discharge: 2017-04-06 | Disposition: A | Discharge status: Home / Self Care | Payer: Medicare Other | Attending: Emergency Medicine | Admitting: Emergency Medicine

## 2017-04-06 ENCOUNTER — Emergency Department: Payer: Medicare Other

## 2017-04-06 DIAGNOSIS — E871 Hypo-osmolality and hyponatremia: Secondary | ICD-10-CM

## 2017-04-06 DIAGNOSIS — I959 Hypotension, unspecified: Secondary | ICD-10-CM | POA: Diagnosis not present

## 2017-04-06 DIAGNOSIS — F17210 Nicotine dependence, cigarettes, uncomplicated: Secondary | ICD-10-CM | POA: Diagnosis not present

## 2017-04-06 DIAGNOSIS — R42 Dizziness and giddiness: Secondary | ICD-10-CM | POA: Insufficient documentation

## 2017-04-06 DIAGNOSIS — E876 Hypokalemia: Secondary | ICD-10-CM

## 2017-04-06 DIAGNOSIS — I10 Essential (primary) hypertension: Secondary | ICD-10-CM | POA: Diagnosis not present

## 2017-04-06 DIAGNOSIS — J449 Chronic obstructive pulmonary disease, unspecified: Secondary | ICD-10-CM | POA: Diagnosis not present

## 2017-04-06 DIAGNOSIS — Z79899 Other long term (current) drug therapy: Secondary | ICD-10-CM | POA: Insufficient documentation

## 2017-04-06 DIAGNOSIS — R531 Weakness: Secondary | ICD-10-CM | POA: Diagnosis not present

## 2017-04-06 LAB — BASIC METABOLIC PANEL
Anion gap: 9 (ref 5–15)
BUN: 8 mg/dL (ref 6–20)
CO2: 27 mmol/L (ref 22–32)
Calcium: 8.4 mg/dL — ABNORMAL LOW (ref 8.9–10.3)
Chloride: 92 mmol/L — ABNORMAL LOW (ref 101–111)
Creatinine, Ser: 1.07 mg/dL (ref 0.61–1.24)
GFR calc Af Amer: >60 mL/min (ref >60)
GFR calc non Af Amer: >60 mL/min (ref >60)
Glucose, Bld: 108 mg/dL — ABNORMAL HIGH (ref 65–99)
Potassium: 2.4 mmol/L — CL (ref 3.5–5.1)
Sodium: 128 mmol/L — ABNORMAL LOW (ref 135–145)

## 2017-04-06 LAB — CBC
HCT: 34.3 % — ABNORMAL LOW (ref 40.0–52.0)
Hemoglobin: 11.9 g/dL — ABNORMAL LOW (ref 13.0–18.0)
MCH: 30.5 pg (ref 26.0–34.0)
MCHC: 34.7 g/dL (ref 32.0–36.0)
MCV: 87.8 fL (ref 80.0–100.0)
Platelets: 172 10*3/uL (ref 150–440)
RBC: 3.9 MIL/uL — ABNORMAL LOW (ref 4.40–5.90)
RDW: 14.1 % (ref 11.5–14.5)
WBC: 7.5 10*3/uL (ref 3.8–10.6)

## 2017-04-06 LAB — URINALYSIS, COMPLETE (UACMP) WITH MICROSCOPIC
Bacteria, UA: NONE SEEN
Bilirubin Urine: NEGATIVE
Glucose, UA: NEGATIVE mg/dL
Hgb urine dipstick: NEGATIVE
Ketones, ur: NEGATIVE mg/dL
Leukocytes, UA: NEGATIVE
Nitrite: NEGATIVE
Protein, ur: NEGATIVE mg/dL
Specific Gravity, Urine: 1.005 (ref 1.005–1.030)
pH: 7 (ref 5.0–8.0)

## 2017-04-06 LAB — TROPONIN I: Troponin I: <0.03 ng/mL (ref <0.03)

## 2017-04-06 MED ORDER — POTASSIUM CHLORIDE 20 MEQ PO PACK: 20.0000 meq | PACK | Freq: Two times a day (BID) | ORAL | 0 refills | Status: DC

## 2017-04-06 MED ORDER — POTASSIUM CHLORIDE CRYS ER 20 MEQ PO TBCR
40.0000 meq | EXTENDED_RELEASE_TABLET | Freq: Once | ORAL | Status: AC
  Administered 2017-04-06: 40 meq via ORAL
  Filled 2017-04-06: qty 2

## 2017-04-06 MED ORDER — SODIUM CHLORIDE 0.9 % IV BOLUS (SEPSIS)
500.0000 mL | Freq: Once | INTRAVENOUS | Status: AC
  Administered 2017-04-06: 500 mL via INTRAVENOUS

## 2017-04-06 NOTE — Discharge Instructions (Signed)
Follow up with your regular doctor as planned.  Return to the ER for new, worsening or recurrent lightheadeness, weakness, confusion, difficulty breathing, palpitations or any other symptoms that concern you.  You also may return at any time if you change your mind and want to resume treatment in the hospital.

## 2017-04-06 NOTE — ED Notes (Signed)
Pt back in room.

## 2017-04-06 NOTE — Telephone Encounter (Signed)
Discussed case with Donnie Mesa CMA. Requested that patient be evaluated more emergently at hospital ED given hypotensive and symptomatic. He is a complex patient with multiple medical co-morbidities and high risk for complication. He is followed by [facility], and has been evaluated for hospice recently but he has declined this, and has End Stage COPD.  [name], [facility] 04/06/2017, 5:11 PM

## 2017-04-06 NOTE — ED Notes (Signed)
ED Provider at bedside. 

## 2017-04-06 NOTE — Telephone Encounter (Signed)
The  Occupational Therapist Sherlynn Stalls from [place] called to report the pt blood pressure 80/40. He is also complaining of dizziness and feeling malaise. The symptoms started this morning. I advised the pt and Therapist to call EMS because of his new onset symptoms. They verbalize understanding.

## 2017-04-06 NOTE — ED Provider Notes (Signed)
[facility] Emergency Department Provider Note ____________________________________________   First MD Initiated Contact with Patient 04/06/17 1506     (approximate)  I have reviewed the triage vital signs and the nursing notes.   HISTORY  Chief Complaint Weakness and Hypotension    HPI David Hall. is a 61 y.o. male history of COPD, hypertension, hyperlipidemia who presents with hypotension , acute onset, noted by his visiting nurse today to be 80s/40s, and associated with lightheadedness.  Pt states he felt well until this morning.  He denies any other focal symptoms.    Past Medical History:  Diagnosis Date  . Bipolar disorder ([place])   . COPD (chronic obstructive pulmonary disease) ([place])    on home oxygen.   [name] GERD (gastroesophageal reflux disease)   . Hyperlipidemia   . Hypertension   . Insomnia, controlled   . Obesity   . Oxygen deficiency    2 LNC at night  . Shortness of breath   . Stroke [facility]) 2014   R sided hemiplegia  . Substance abuse 2016   cocaine use.    Patient Active Problem List   Diagnosis Date Noted  . GERD (gastroesophageal reflux disease) 12/10/2015  . 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 Right 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) ([place]) 10/27/2015  . Abnormal MRI of head 10/27/2015  . Memory loss 07/25/2015  . Faintness 07/25/2015  . On home oxygen therapy 04/02/2015  . Bipolar 1 disorder, depressed ([place]) 01/27/2015  . Pulmonary nodule 01/27/2015  . Blood glucose elevated 01/27/2015  . Restless leg 01/27/2015  . Compulsive tobacco user syndrome 01/27/2015  . Arthritis of knee, degenerative 01/27/2015  . Benign hypertension with CKD (chronic kidney disease), stage II 01/27/2015  . Cocaine abuse 01/27/2015  . COPD (chronic obstructive pulmonary disease) with emphysema ([place]) 01/27/2015  . Chronic respiratory failure ([place]) 11/12/2014  . Recurrent falls  05/29/2014  . Cervical pain 05/29/2014  . Bursitis of elbow 05/29/2014  . Elbow pain 05/29/2014  . Dislocated shoulder 05/21/2014  . Lung mass 05/21/2014  . Dyslipidemia 10/11/2013  . History of CVA with residual deficit 09/28/2013    Past Surgical History:  Procedure Laterality Date  . CARPAL TUNNEL RELEASE    . TONSILLECTOMY      Prior to Admission medications   Medication Sig Start Date End Date Taking? Authorizing Provider  albuterol (PROVENTIL) (2.5 MG/3ML) 0.083% nebulizer solution USE 1 VIAL VIA NEBULIZER EVERY 6 HOURS AS NEEDED FOR WHEEZING OR SHORTNESS OF BREATH 09/26/15  Yes Krebs, Amy Lauren, NP  albuterol (VENTOLIN HFA) 108 (90 Base) MCG/ACT inhaler Inhale 2 puffs into the lungs 4 (four) times daily as needed. 07/12/16  Yes Karamalegos, Devonne Doughty, DO  Amlodipine-Valsartan-HCTZ 10-160-12.5 MG TABS Take 1 pill daily in morning. 01/11/17  Yes Karamalegos, Devonne Doughty, DO  aspirin 325 MG EC tablet Take 325 mg by mouth daily.   Yes [provider]  atorvastatin (LIPITOR) 40 MG tablet Take 1 tablet (40 mg total) by mouth daily. 09/06/16  Yes Karamalegos, Devonne Doughty, DO  Brexpiprazole (REXULTI) 2 MG TABS Take 2 mg by mouth at bedtime.   Yes [provider]  chlorhexidine (HIBICLENS) 4 % external liquid Apply 1 application topically 2 (two) times daily.   Yes [provider]  divalproex (DEPAKOTE) 250 MG DR tablet TAKE 2 TABLETS BY MOUTH TWICE DAILY 04/07/16  Yes [provider]  DULoxetine (CYMBALTA) 30 MG capsule TK 3 CS PO QAM 06/20/15  Yes [provider]  Fluticasone-Salmeterol (ADVAIR DISKUS) 250-50 MCG/DOSE AEPB USE 1 INHALATION BY MOUTH TWICE DAILY 08/05/16  Yes Karamalegos, Devonne Doughty, DO  isosorbide mononitrate (IMDUR) 30 MG 24 hr tablet Take 1 tablet (30 mg total) by mouth daily. 09/07/16  Yes Karamalegos, Devonne Doughty, DO  metoprolol tartrate (LOPRESSOR) 50 MG tablet Take 0.5 tablets (25 mg total) by mouth 2 (two) times daily.  02/17/17  Yes Karamalegos, Devonne Doughty, DO  nitroGLYCERIN (NITROSTAT) 0.3 MG SL tablet Place 1 tablet (0.3 mg total) under the tongue every 5 (five) minutes as needed for chest pain. 10/11/13  Yes Angiulli, Lavon Paganini, PA-C  omeprazole (PRILOSEC) 20 MG capsule TAKE 1 CAPSULE BY MOUTH TWICE DAILY 03/06/17  Yes Karamalegos, Devonne Doughty, DO  traZODone (DESYREL) 50 MG tablet Take by mouth. Reported on 10/27/2015   Yes [provider]  cyclobenzaprine (FLEXERIL) 10 MG tablet Take 0.5-1 tablets (5-10 mg total) by mouth 2 (two) times daily as needed for muscle spasms. Patient not taking: Reported on 04/06/2017 02/18/17   Olin Hauser, DO  gabapentin (NEURONTIN) 300 MG capsule Take 2 capsule in the AM, 1 capsule in the afternoon and 2 capsules at night. Patient not taking: Reported on 04/06/2017 02/04/17   Olin Hauser, DO  hydrALAZINE (APRESOLINE) 25 MG tablet Take 1 tablet (25 mg total) by mouth 3 (three) times daily. Patient not taking: Reported on 02/17/2017 12/10/16   Olin Hauser, DO  ibuprofen (ADVIL,MOTRIN) 400 MG tablet TAKE 1 TABLET(400 MG) BY MOUTH TWICE DAILY Patient not taking: Reported on 02/17/2017 09/30/15   Luciana Axe, NP  OXYGEN 2 L by Does not apply route at bedtime.    [provider]  potassium chloride (KLOR-CON) 20 MEQ packet Take 20 mEq by mouth 2 (two) times daily. 04/06/17   Arta Silence, MD    Allergies Quetiapine; Seroquel [quetiapine fumarate]; and Codeine  Family History  Problem Relation Age of Onset  . CAD Mother   . Hypertension Mother   . Stroke Mother   . Lung disease Father     Social History Social History  Substance Use Topics  . Smoking status: Current Every Day Smoker    Packs/day: 0.50    Years: 45.00    Types: Cigarettes  . Smokeless tobacco: Current User     Comment: smoking 10-15 cigs daily   . Alcohol use No    Review of Systems  Constitutional: No fever/chills Eyes: No visual  changes. ENT: No sore throat. Cardiovascular: Denies chest pain. Respiratory: Positive for baseline shortness of breath, no change. Gastrointestinal: No nausea, no vomiting.  No diarrhea.  Genitourinary: Negative for dysuria.  Musculoskeletal: Negative for back pain. Skin: Negative for rash. Neurological: Negative for headaches ____________________________________________   PHYSICAL EXAM:  VITAL SIGNS: ED Triage Vitals [04/06/17 1439]  Enc Vitals Group     BP 99/60     Pulse Rate 83     Resp 18     Temp 97.9 F (36.6 C)     Temp Source Axillary     SpO2 96 %     Weight 160 lb (72.6 kg)     Height 5\' 10"  (1.778 m)     Head Circumference      Peak Flow      Pain Score      Pain Loc      Pain Edu?      Excl. in [place]?     Constitutional: Alert and oriented. Well appearing and in  no acute distress. Eyes: Conjunctivae are normal.  EOMI.  PERRL.  Head: Atraumatic. Nose: No congestion/rhinnorhea. Mouth/Throat: Mucous membranes are moist.   Neck: Normal range of motion.  Cardiovascular: Normal rate, regular rhythm. Grossly normal heart sounds.  Good peripheral circulation. Respiratory: Normal respiratory effort.  No retractions. Bilat somewhat dec breath sounds, lungs CTAB. Gastrointestinal: Soft and nontender. No distention.  Genitourinary: No CVA tenderness. Musculoskeletal: No lower extremity edema.  Extremities warm and well perfused.  Neurologic:  Normal speech and language. No gross focal neurologic deficits are appreciated.  RUE and RLE weakness, chronic.  Skin:  Skin is warm and dry. No rash noted. Psychiatric: Mood and affect are normal. Speech and behavior are normal.  ____________________________________________   LABS (all labs ordered are listed, but only abnormal results are displayed)  Labs Reviewed  BASIC METABOLIC PANEL - Abnormal; Notable for the following:       Result Value   Sodium 128 (*)    Potassium 2.4 (*)    Chloride 92 (*)    Glucose, Bld  108 (*)    Calcium 8.4 (*)    All other components within normal limits  CBC - Abnormal; Notable for the following:    RBC 3.90 (*)    Hemoglobin 11.9 (*)    HCT 34.3 (*)    All other components within normal limits  URINALYSIS, COMPLETE (UACMP) WITH MICROSCOPIC - Abnormal; Notable for the following:    Color, Urine YELLOW (*)    APPearance CLEAR (*)    Squamous Epithelial / LPF 0-5 (*)    All other components within normal limits  TROPONIN I   ____________________________________________  EKG  ED ECG REPORT I, Arta Silence, the attending physician, personally viewed and interpreted this ECG.  Date: 04/06/2017 EKG Time: 14:37 Rate: 82 Rhythm: normal sinus rhythm QRS Axis: normal Intervals: normal ST/T Wave abnormalities: borderline repol abnormality Narrative Interpretation: no evidence of acute ischemia  ____________________________________________  RADIOLOGY  Chest Xray with no acute findings.    ____________________________________________   PROCEDURES  Procedure(s) performed: No    Critical Care performed: No ____________________________________________   INITIAL IMPRESSION / ASSESSMENT AND PLAN / ED COURSE  Pertinent labs & imaging results that were available during my care of the patient were reviewed by me and considered in my medical decision making (see chart for details).  61 y/o M hx COPD, hypertension, hyperlipidemia p/w hypotension noted by his home nurse this AM as well as lightheadedness acute onset since this AM, now improved after fluids by EMS.  No other focal symptoms on ROS.  VS normal except for the borderline BP.  Pt comfortable and not acutely ill appearing.  Exam as described with no focal findings except for old R side neuro deficit from prior CVA.  EKG with no acute findings.  Ddx for this resolved hypotension incl dehydration, vasovagal episode, less likely infection/sepsis.  Plan: basic labs, trop x1, UA and CXR to r/o  infection, and additional fluids.  If BP remains stable, no recurrent sx and negative workup, anticipate d/c home.  Clinical Course as of Apr 07 2339  Wed Apr 06, 2017  1734 Sodium: [name] 128 [SS]    Clinical Course User Index [SS] Arta Silence, MD    ----------------------------------------- 5:46 PM on 04/06/2017 -----------------------------------------  Pt's VS remain stable.  UA and CXR negative.  Lab workup remarkable for hypokalemia and hyponatremia.  Pt has labs showing slightly low K of 3.3 and Na of 132 a month ago.  Pt states  he has had this in the past.  At this time pt has no weakness, confusion or other symptoms of these abnormalities and no EKG findings.  I suspect this is likely worsening of chronic electrolyte disturbances.  I offered to replete K and give additional fluids and recheck labs, and if trending up possible d/c home - versus admission if not.  However pt is adamant that he would like to go home and get PO potassium.  States he has PMD followup in 1 week.  Pt understands and is able to paraphrase back to me that low K and Na can cause cardiac arrhythmia or other life threatening complications or death.  He understands he may return at any time if he changes his mind or feels worse.  He demonstrates decision making capacity. Return precautions given.  ____________________________________________   FINAL CLINICAL IMPRESSION(S) / ED DIAGNOSES  Final diagnoses:  Hypotension, unspecified hypotension type  Hyponatremia  Hypokalemia      NEW MEDICATIONS STARTED DURING THIS VISIT:  Discharge Medication List as of 04/06/2017  5:54 PM    START taking these medications   Details  potassium chloride (KLOR-CON) 20 MEQ packet Take 20 mEq by mouth 2 (two) times daily., Starting Wed 04/06/2017, Print         Note:  This document was prepared using Dragon voice recognition software and may include unintentional dictation errors.    Arta Silence,  MD 04/06/17 2342

## 2017-04-06 NOTE — ED Triage Notes (Signed)
Pt comes into the ED via EMS from home c/o increased weakness and hypotension.  Patient has h/o HTN, but had blood pressure checked today with PT and it was 88/50.  Patient given 500 bolus in route.  Patient is alert and oriented x4.  H/o stroke with ride side weakness at baseline.  Patient in NAD at this time with even and unlabored respirations.

## 2017-04-06 NOTE — ED Notes (Signed)
Pt ambulatory to toilet with no difficulty or distress.

## 2017-04-06 NOTE — ED Notes (Signed)
Patient transported to XR. 

## 2017-04-14 ENCOUNTER — Ambulatory Visit: Payer: Self-pay | Admitting: Family Medicine

## 2017-04-19 ENCOUNTER — Ambulatory Visit (INDEPENDENT_AMBULATORY_CARE_PROVIDER_SITE_OTHER): Payer: Medicare Other | Admitting: Family Medicine

## 2017-04-19 ENCOUNTER — Ambulatory Visit (INDEPENDENT_AMBULATORY_CARE_PROVIDER_SITE_OTHER): Payer: Medicare Other

## 2017-04-19 ENCOUNTER — Other Ambulatory Visit: Payer: Self-pay | Admitting: Family Medicine

## 2017-04-19 ENCOUNTER — Encounter: Payer: Self-pay | Admitting: Family Medicine

## 2017-04-19 VITALS — BP 88/58 | HR 82 | Temp 97.7°F | Resp 16 | Ht 70.0 in | Wt 148.6 lb

## 2017-04-19 DIAGNOSIS — I129 Hypertensive chronic kidney disease with stage 1 through stage 4 chronic kidney disease, or unspecified chronic kidney disease: Secondary | ICD-10-CM | POA: Diagnosis not present

## 2017-04-19 DIAGNOSIS — N182 Chronic kidney disease, stage 2 (mild): Secondary | ICD-10-CM | POA: Diagnosis not present

## 2017-04-19 DIAGNOSIS — G890 Central pain syndrome: Secondary | ICD-10-CM

## 2017-04-19 DIAGNOSIS — J432 Centrilobular emphysema: Secondary | ICD-10-CM | POA: Diagnosis not present

## 2017-04-19 DIAGNOSIS — Z0000 Encounter for general adult medical examination without abnormal findings: Secondary | ICD-10-CM | POA: Diagnosis not present

## 2017-04-19 DIAGNOSIS — H1031 Unspecified acute conjunctivitis, right eye: Secondary | ICD-10-CM | POA: Diagnosis not present

## 2017-04-19 DIAGNOSIS — I6930 Unspecified sequelae of cerebral infarction: Secondary | ICD-10-CM

## 2017-04-19 DIAGNOSIS — E876 Hypokalemia: Secondary | ICD-10-CM

## 2017-04-19 DIAGNOSIS — J9612 Chronic respiratory failure with hypercapnia: Secondary | ICD-10-CM | POA: Diagnosis not present

## 2017-04-19 DIAGNOSIS — Z23 Encounter for immunization: Secondary | ICD-10-CM

## 2017-04-19 DIAGNOSIS — Z9981 Dependence on supplemental oxygen: Secondary | ICD-10-CM | POA: Diagnosis not present

## 2017-04-19 MED ORDER — POLYMYXIN B-TRIMETHOPRIM 10000-0.1 UNIT/ML-% OP SOLN: 1.0000 [drp] | OPHTHALMIC | 0 refills | Status: DC

## 2017-04-19 MED ORDER — GABAPENTIN 600 MG PO TABS
ORAL_TABLET | ORAL | 3 refills | Status: DC
Start: 2017-04-19 — End: 2017-09-07

## 2017-04-19 MED ORDER — POTASSIUM CHLORIDE CRYS ER 20 MEQ PO TBCR: 20.0000 meq | EXTENDED_RELEASE_TABLET | Freq: Every day | ORAL | 5 refills | Status: DC

## 2017-04-19 MED ORDER — BACLOFEN 10 MG PO TABS: 5.0000 mg | ORAL_TABLET | Freq: Three times a day (TID) | ORAL | 1 refills | Status: DC | PRN

## 2017-04-19 NOTE — Progress Notes (Signed)
Subjective:   David Hall. is a 60 y.o. male who presents for an Initial Medicare Annual Wellness Visit.  Review of Systems   Cardiac Risk Factors include: advanced age (>81men, >43 women);male gender;hypertension;dyslipidemia    Objective:    Today's Vitals   04/19/17 1336 04/19/17 1345  BP: (!) 88/58   Pulse: 82   Resp: 16   Temp: 97.7 F (36.5 C)   Weight: 148 lb 9.6 oz (67.4 kg)   Height: 5\' 10"  (1.778 m)   PainSc:  8    Body mass index is 21.32 kg/m.  Current Medications (verified) Outpatient Encounter Prescriptions as of 04/19/2017  Medication Sig  . albuterol (PROVENTIL) (2.5 MG/3ML) 0.083% nebulizer solution USE 1 VIAL VIA NEBULIZER EVERY 6 HOURS AS NEEDED FOR WHEEZING OR SHORTNESS OF BREATH  . albuterol (VENTOLIN HFA) 108 (90 Base) MCG/ACT inhaler Inhale 2 puffs into the lungs 4 (four) times daily as needed.  . Amlodipine-Valsartan-HCTZ 10-160-12.5 MG TABS Take 1 pill daily in morning.  [name] aspirin 325 MG EC tablet Take 325 mg by mouth daily.  [name] atorvastatin (LIPITOR) 40 MG tablet Take 1 tablet (40 mg total) by mouth daily.  . Brexpiprazole (REXULTI) 2 MG TABS Take 2 mg by mouth at bedtime.  . divalproex (DEPAKOTE) 250 MG DR tablet TAKE 2 TABLETS BY MOUTH TWICE DAILY  . DULoxetine (CYMBALTA) 30 MG capsule TK 3 CS PO QAM  . Fluticasone-Salmeterol (ADVAIR DISKUS) 250-50 MCG/DOSE AEPB USE 1 INHALATION BY MOUTH TWICE DAILY  . isosorbide mononitrate (IMDUR) 30 MG 24 hr tablet Take 1 tablet (30 mg total) by mouth daily.  . metoprolol tartrate (LOPRESSOR) 50 MG tablet Take 0.5 tablets (25 mg total) by mouth 2 (two) times daily.  [name] omeprazole (PRILOSEC) 20 MG capsule TAKE 1 CAPSULE BY MOUTH TWICE DAILY  . OXYGEN 2 L by Does not apply route at bedtime.  . potassium chloride (KLOR-CON) 20 MEQ packet Take 20 mEq by mouth 2 (two) times daily.  . traZODone (DESYREL) 50 MG tablet Take by mouth. Reported on 10/27/2015  . chlorhexidine (HIBICLENS) 4 % external liquid Apply  1 application topically 2 (two) times daily.  . cyclobenzaprine (FLEXERIL) 10 MG tablet Take 0.5-1 tablets (5-10 mg total) by mouth 2 (two) times daily as needed for muscle spasms. (Patient not taking: Reported on 04/06/2017)  . gabapentin (NEURONTIN) 300 MG capsule Take 2 capsule in the AM, 1 capsule in the afternoon and 2 capsules at night. (Patient not taking: Reported on 04/06/2017)  . hydrALAZINE (APRESOLINE) 25 MG tablet Take 1 tablet (25 mg total) by mouth 3 (three) times daily. (Patient not taking: Reported on 02/17/2017)  . ibuprofen (ADVIL,MOTRIN) 400 MG tablet TAKE 1 TABLET(400 MG) BY MOUTH TWICE DAILY (Patient not taking: Reported on 02/17/2017)  . nitroGLYCERIN (NITROSTAT) 0.3 MG SL tablet Place 1 tablet (0.3 mg total) under the tongue every 5 (five) minutes as needed for chest pain. (Patient not taking: Reported on 04/19/2017)   Facility-Administered Encounter Medications as of 04/19/2017  Medication  . ipratropium-albuterol (DUONEB) 0.5-2.5 (3) MG/3ML nebulizer solution 3 mL    Allergies (verified) Quetiapine; Seroquel [quetiapine fumarate]; and Codeine   History: Past Medical History:  Diagnosis Date  . Bipolar disorder ([place])   . COPD (chronic obstructive pulmonary disease) ([place])    on home oxygen.   [name] GERD (gastroesophageal reflux disease)   . Hyperlipidemia   . Hypertension   . Insomnia, controlled   . Obesity   . Oxygen deficiency  2 LNC at night  . Shortness of breath   . Stroke [facility]) 2014   R sided hemiplegia  . Substance abuse 2016   cocaine use.   Past Surgical History:  Procedure Laterality Date  . CARPAL TUNNEL RELEASE    . TONSILLECTOMY     Family History  Problem Relation Age of Onset  . CAD Mother   . Hypertension Mother   . Stroke Mother   . Lung disease Father    Social History   Occupational History  . Not on file.   Social History Main Topics  . Smoking status: Current Every Day Smoker    Packs/day: 0.50    Years: 45.00    Types:  Cigarettes  . Smokeless tobacco: Current User     Comment: smoking 10-15 cigs daily   . Alcohol use No  . Drug use: No  . Sexual activity: Not on file   Tobacco Counseling Ready to quit: No Counseling given: No   Activities of Daily Living In your present state of health, do you have any difficulty performing the following activities: 04/19/2017  Hearing? N  Vision? N  Difficulty concentrating or making decisions? N  Walking or climbing stairs? Y  Dressing or bathing? N  Doing errands, shopping? N  Preparing Food and eating ? N  Using the Toilet? N  In the past six months, have you accidently leaked urine? Y  Do you have problems with loss of bowel control? N  Managing your Medications? N  Managing your Finances? N  Housekeeping or managing your Housekeeping? N  Some recent data might be hidden    Immunizations and Health Maintenance Immunization History  Administered Date(s) Administered  . Influenza Split 08/08/2012, 05/04/2013  . Influenza,inj,Quad PF,6+ Mos 04/23/2014, 07/08/2015, 07/23/2016  . Pneumococcal Polysaccharide-23 08/10/2011, 08/08/2012   Health Maintenance Due  Topic Date Due  . Hepatitis C Screening  1955/08/26  . HIV Screening  01/17/1971  . TETANUS/TDAP  01/17/1975  . INFLUENZA VACCINE  03/09/2017    Patient Care Team: Olin Hauser, DO as PCP - General (Family Medicine)  Indicate any recent Medical Services you may have received from other than [facility] providers in the past year (date may be approximate).    Assessment:   This is a routine wellness examination for David Hall.   Hearing/Vision screen Vision Screening Comments: [facility]  Dietary issues and exercise activities discussed: Current Exercise Habits: Home exercise routine, Time (Minutes): 45, Frequency (Times/Week): 4, Weekly Exercise (Minutes/Week): 180, Intensity: Mild, Exercise limited by: None identified  Goals    . Quit smoking / using tobacco        Depression Screen PHQ 2/9 Scores 04/19/2017 10/27/2015 07/08/2015 04/02/2015  PHQ - 2 Score 0 0 - 6  PHQ- 9 Score - - - 27  Exception Documentation - Medical reason Other- indicate reason in comment box -  Not completed - - [facility] and they taking care for it -    Fall Risk Fall Risk  04/19/2017 10/27/2015 07/25/2015 07/08/2015 04/02/2015  Falls in the past year? Yes No No Yes Yes  Number falls in past yr: 2 or more - - 1 2 or more  Injury with Fall? Yes - - No Yes  Risk Factor Category  High Fall Risk - - - High Fall Risk  Risk for fall due to : Impaired balance/gait;History of fall(s);Impaired mobility - - - -  Follow up Falls prevention discussed;Falls evaluation completed - - - -  Cognitive Function:     6CIT Screen 04/19/2017  What Year? 0 points  What month? 0 points  What time? 0 points  Count back from 20 0 points  Months in reverse 0 points  Repeat phrase 2 points  Total Score 2    Screening Tests Health Maintenance  Topic Date Due  . Hepatitis C Screening  23-Jul-1956  . HIV Screening  01/17/1971  . TETANUS/TDAP  01/17/1975  . INFLUENZA VACCINE  03/09/2017  . COLONOSCOPY  07/07/2025        Plan:    I have personally reviewed and addressed the Medicare Annual Wellness questionnaire and have noted the following in the patient's chart:  A. Medical and social history B. Use of alcohol, tobacco or illicit drugs  C. Current medications and supplements D. Functional ability and status E.  Nutritional status F.  Physical activity G. Advance directives H. List of other physicians I.  Hospitalizations, surgeries, and ER visits in previous 12 months J.  [place] such as hearing and vision if needed, cognitive and depression L. Referrals and appointments   In addition, I have reviewed and discussed with patient certain preventive protocols, quality metrics, and best practice recommendations. A written personalized care plan for  preventive services as well as general preventive health recommendations were provided to patient.   Signed,  Tyler Aas, LPN Nurse Health Advisor   MD Recommendations:Advised of BP reading.

## 2017-04-19 NOTE — Patient Instructions (Addendum)
[name] , Thank you for taking time to come for your Medicare Wellness Visit. I appreciate your ongoing commitment to your health goals. Please review the following plan we discussed and let me know if I can assist you in the future.   Screening recommendations/referrals: Colonoscopy: completed 07/08/2015 Recommended yearly ophthalmology/optometry visit for glaucoma screening and checkup Recommended yearly dental visit for hygiene and checkup  Vaccinations: Influenza vaccine: due Pneumococcal vaccine: up to date Tdap vaccine: due, check with your insurance company for coverage Shingles vaccine:due, check with your insurance company for coverage  Advanced directives: Advance directive discussed with you today. I have provided a copy for you to complete at home and have notarized. Once this is complete please bring a copy in to our office so we can scan it into your chart.  Conditions/risks identified: smoking cessation discussed  Next appointment: Follow up in one year for your annual wellness exam   Preventive Care 40-64 Years, Male Preventive care refers to lifestyle choices and visits with your health care provider that can promote health and wellness. What does preventive care include?  A yearly physical exam. This is also called an annual well check.  Dental exams once or twice a year.  Routine eye exams. Ask your health care provider how often you should have your eyes checked.  Personal lifestyle choices, including:  Daily care of your teeth and gums.  Regular physical activity.  Eating a healthy diet.  Avoiding tobacco and drug use.  Limiting alcohol use.  Practicing safe sex.  Taking low-dose aspirin every day starting at age 55. What happens during an annual well check? The services and screenings done by your health care provider during your annual well check will depend on your age, overall health, lifestyle risk factors, and family history of  disease. Counseling  Your health care provider may ask you questions about your:  Alcohol use.  Tobacco use.  Drug use.  Emotional well-being.  Home and relationship well-being.  Sexual activity.  Eating habits.  Work and work Statistician. Screening  You may have the following tests or measurements:  Height, weight, and BMI.  Blood pressure.  Lipid and cholesterol levels. These may be checked every 5 years, or more frequently if you are over 78 years old.  Skin check.  Lung cancer screening. You may have this screening every year starting at age 82 if you have a 30-pack-year history of smoking and currently smoke or have quit within the past 15 years.  Fecal occult blood test (FOBT) of the stool. You may have this test every year starting at age 38.  Flexible sigmoidoscopy or colonoscopy. You may have a sigmoidoscopy every 5 years or a colonoscopy every 10 years starting at age 7.  Prostate cancer screening. Recommendations will vary depending on your family history and other risks.  Hepatitis C blood test.  Hepatitis B blood test.  Sexually transmitted disease (STD) testing.  Diabetes screening. This is done by checking your blood sugar (glucose) after you have not eaten for a while (fasting). You may have this done every 1-3 years. Discuss your test results, treatment options, and if necessary, the need for more tests with your health care provider. Vaccines  Your health care provider may recommend certain vaccines, such as:  Influenza vaccine. This is recommended every year.  Tetanus, diphtheria, and acellular pertussis (Tdap, Td) vaccine. You may need a Td booster every 10 years.  Zoster vaccine. You may need this after age 52.  Pneumococcal  13-valent conjugate (PCV13) vaccine. You may need this if you have certain conditions and have not been vaccinated.  Pneumococcal polysaccharide (PPSV23) vaccine. You may need one or two doses if you smoke cigarettes  or if you have certain conditions. Talk to your health care provider about which screenings and vaccines you need and how often you need them. This information is not intended to replace advice given to you by your health care provider. Make sure you discuss any questions you have with your health care provider. Document Released: 08/22/2015 Document Revised: 04/14/2016 Document Reviewed: 05/27/2015 Elsevier Interactive Patient Education  2017 [place] Prevention in the Home Falls can cause injuries. They can happen to people of all ages. There are many things you can do to make your home safe and to help prevent falls. What can I do on the outside of my home?  Regularly fix the edges of walkways and driveways and fix any cracks.  Remove anything that might make you trip as you walk through a door, such as a raised step or threshold.  Trim any bushes or trees on the path to your home.  Use bright outdoor lighting.  Clear any walking paths of anything that might make someone trip, such as rocks or tools.  Regularly check to see if handrails are loose or broken. Make sure that both sides of any steps have handrails.  Any raised decks and porches should have guardrails on the edges.  Have any leaves, snow, or ice cleared regularly.  Use sand or salt on walking paths during winter.  Clean up any spills in your garage right away. This includes oil or grease spills. What can I do in the bathroom?  Use night lights.  Install grab bars by the toilet and in the tub and shower. Do not use towel bars as grab bars.  Use non-skid mats or decals in the tub or shower.  If you need to sit down in the shower, use a plastic, non-slip stool.  Keep the floor dry. Clean up any water that spills on the floor as soon as it happens.  Remove soap buildup in the tub or shower regularly.  Attach bath mats securely with double-sided non-slip rug tape.  Do not have throw rugs and other  things on the floor that can make you trip. What can I do in the bedroom?  Use night lights.  Make sure that you have a light by your bed that is easy to reach.  Do not use any sheets or blankets that are too big for your bed. They should not hang down onto the floor.  Have a firm chair that has side arms. You can use this for support while you get dressed.  Do not have throw rugs and other things on the floor that can make you trip. What can I do in the kitchen?  Clean up any spills right away.  Avoid walking on wet floors.  Keep items that you use a lot in easy-to-reach places.  If you need to reach something above you, use a strong step stool that has a grab bar.  Keep electrical cords out of the way.  Do not use floor polish or wax that makes floors slippery. If you must use wax, use non-skid floor wax.  Do not have throw rugs and other things on the floor that can make you trip. What can I do with my stairs?  Do not leave any items on the stairs.  Make sure that there are handrails on both sides of the stairs and use them. Fix handrails that are broken or loose. Make sure that handrails are as long as the stairways.  Check any carpeting to make sure that it is firmly attached to the stairs. Fix any carpet that is loose or worn.  Avoid having throw rugs at the top or bottom of the stairs. If you do have throw rugs, attach them to the floor with carpet tape.  Make sure that you have a light switch at the top of the stairs and the bottom of the stairs. If you do not have them, ask someone to add them for you. What else can I do to help prevent falls?  Wear shoes that:  Do not have high heels.  Have rubber bottoms.  Are comfortable and fit you well.  Are closed at the toe. Do not wear sandals.  If you use a stepladder:  Make sure that it is fully opened. Do not climb a closed stepladder.  Make sure that both sides of the stepladder are locked into place.  Ask  someone to hold it for you, if possible.  Clearly mark and make sure that you can see:  Any grab bars or handrails.  First and last steps.  Where the edge of each step is.  Use tools that help you move around (mobility aids) if they are needed. These include:  Canes.  Walkers.  Scooters.  Crutches.  Turn on the lights when you go into a dark area. Replace any light bulbs as soon as they burn out.  Set up your furniture so you have a clear path. Avoid moving your furniture around.  If any of your floors are uneven, fix them.  If there are any pets around you, be aware of where they are.  Review your medicines with your doctor. Some medicines can make you feel dizzy. This can increase your chance of falling. Ask your doctor what other things that you can do to help prevent falls. This information is not intended to replace advice given to you by your health care provider. Make sure you discuss any questions you have with your health care provider. Document Released: 05/22/2009 Document Revised: 01/01/2016 Document Reviewed: 08/30/2014 Elsevier Interactive Patient Education  2017 [employer].

## 2017-04-19 NOTE — Patient Instructions (Addendum)
Thank you for coming to the clinic today.  1. STOP the 3 BP pill combo - that was recalled  Start Amlodipine 10mg  daily only - will not add other BP meds  Monitor BP if too elevated may need to adjust more  2. Will call [facility] to check on O2 concentrator portable and walker, stay tuned  3. Sent new Potassium 62mEq once daily  4. Antibiotic eye drops R eye only every 4 hours for 1 week  5. Take new pain medicine 2 times daily and may add one in afternoon if needed. Stop Gabapentin Capsules.  6.  STOP Flexeril  Start taking Baclofen (Lioresal) 10mg  (muscle relaxant) - start with half (cut) to one whole pill at night as needed for next 1-3 nights (may make you drowsy, caution with driving) see how it affects you, then if tolerated increase to one pill 2 to 3 times a day or (every 8 hours as needed)  Please schedule a Follow-up Appointment to: Return in about 3 months (around 07/19/2017) for COPD, Chronic Pain.  If you have any other questions or concerns, please feel free to call the clinic or send a message through [facility]. You may also schedule an earlier appointment if necessary.  Additionally, you may be receiving a survey about your experience at our clinic within a few days to 1 week by e-mail or mail. We value your feedback.  [name], DO [place]

## 2017-04-19 NOTE — Progress Notes (Signed)
Subjective:    Patient ID: David Alpers., male    DOB: 1956/06/02, 61 y.o.   MRN: [id number]  David Pacella. is a 61 y.o. male presenting on 04/19/2017 for Hypertension  History primarily provided by Sandrea Hughs.  HPI   Right Eye Bacterial Eye injection - Reports irritation and itching of eye, some drainage, in past had similar improved with eye drops - Denies fever/chills, redness of eye, thicker drainage  Generalized Weakness / Deconditioning / H/o CVA w/ residual deficit R-hemiparesis - See prior note 01/11/17 for background information - Recent interval updates, continued HH PT, using walker, and wheelchair frequently, still issues with falling, failed muscle relaxant flexeril due to sedation and fall risk, interested to try alternative - Needs [facility] order [facility]er [facility]  End Stage COPD / Chronic Resp Failure - Followed by Pulmonology, [place] - Reports needs new order for O2 concentrator through [facility]  Chronic Pain - Additional report that he was improved on Gabapentin, however he has been reported to start "snorting" the Gabapentin powder instead, and caregiver / friend asking for tablet rx instead  CHRONIC HTN: Reports concern now with recalled Valsartan, he was on combo pill Current Meds - Amlodipine 10mg , HCTZ 12.5mg , Metoprolol 50mg  BID, Hydralazine 25mg  TID, Reports good compliance, took meds today. Tolerating well, w/o complaints. Denies CP, HA, edema, dizziness / lightheadedness  Hypokalemia: - Previously low in hospital, he left AMA instead of admission - Was given K supplement 21mEq BID, his ins did not cover powder, requesting new rx 23mEq daily  Health Maintenance: - Due for Flu Shot, will receive today  Social History  Substance Use Topics  . Smoking status: Current Every Day Smoker    Packs/day: 0.50    Years: 45.00    Types: Cigarettes  . Smokeless tobacco: Current User     Comment: smoking 10-15 cigs daily   . Alcohol use No     Review of Systems Per HPI unless specifically indicated above     Objective:    BP (!) 88/58   Pulse 82   Temp 97.7 F (36.5 C) (Oral)   Resp 16   Ht 5\' 10"  (1.778 m)   Wt 148 lb 9.6 oz (67.4 kg)   BMI 21.32 kg/m   Wt Readings from Last 3 Encounters:  04/19/17 148 lb 9.6 oz (67.4 kg)  04/19/17 148 lb 9.6 oz (67.4 kg)  04/06/17 160 lb (72.6 kg)    Physical Exam  Constitutional: He is oriented to person, place, and time. He appears well-developed and well-nourished. No distress.  Chronically ill-appearing, mostly comfortable, cooperative, has cane for ambulation, thin appearance  HENT:  Head: Normocephalic and atraumatic.  Mouth/Throat: Oropharynx is clear and moist.  Not wearing any supplemental oxygen.  Eyes: EOM are normal. Right eye exhibits no discharge. Left eye exhibits no discharge.  R eye with slight conjunctival injection without drainage or discharge. No peri orbital erythema or swelling.  Neck: Normal range of motion. Neck supple.  Cardiovascular: Normal rate, regular rhythm, normal heart sounds and intact distal pulses.   No murmur heard. Pulmonary/Chest: No respiratory distress. He has no wheezes. He has no rales.  Unchanged reduced air movement diffusely, some reduced respiratory effort. Occasional cough.  Musculoskeletal: He exhibits no edema.  Neurological: He is alert and oriented to person, place, and time.  Skin: Skin is warm and dry. No rash noted. He is not diaphoretic. No erythema.  Thick dry skin  Psychiatric: He has a  normal mood and affect. His behavior is normal.  Appears poorly groomed, good eye contact, normal speech and thoughts  Nursing note and vitals reviewed.  Results for orders placed or performed during the hospital encounter of 37/90/24  Basic metabolic panel  Result Value Ref Range   Sodium 128 (L) 135 - 145 mmol/L   Potassium 2.4 (LL) 3.5 - 5.1 mmol/L   Chloride 92 (L) 101 - 111 mmol/L   CO2 27 22 - 32 mmol/L   Glucose, Bld  108 (H) 65 - 99 mg/dL   BUN 8 6 - 20 mg/dL   Creatinine, Ser 1.07 0.61 - 1.24 mg/dL   Calcium 8.4 (L) 8.9 - 10.3 mg/dL   GFR calc non Af Amer >60 >60 mL/min   GFR calc Af Amer >60 >60 mL/min   Anion gap 9 5 - 15  CBC  Result Value Ref Range   WBC 7.5 3.8 - 10.6 K/uL   RBC 3.90 (L) 4.40 - 5.90 MIL/uL   Hemoglobin 11.9 (L) 13.0 - 18.0 g/dL   HCT 34.3 (L) 40.0 - 52.0 %   MCV 87.8 80.0 - 100.0 fL   MCH 30.5 26.0 - 34.0 pg   MCHC 34.7 32.0 - 36.0 g/dL   RDW 14.1 11.5 - 14.5 %   Platelets 172 150 - 440 K/uL  Urinalysis, Complete w Microscopic  Result Value Ref Range   Color, Urine YELLOW (A) YELLOW   APPearance CLEAR (A) CLEAR   Specific Gravity, Urine 1.005 1.005 - 1.030   pH 7.0 5.0 - 8.0   Glucose, UA NEGATIVE NEGATIVE mg/dL   Hgb urine dipstick NEGATIVE NEGATIVE   Bilirubin Urine NEGATIVE NEGATIVE   Ketones, ur NEGATIVE NEGATIVE mg/dL   Protein, ur NEGATIVE NEGATIVE mg/dL   Nitrite NEGATIVE NEGATIVE   Leukocytes, UA NEGATIVE NEGATIVE   RBC / HPF 0-5 0 - 5 RBC/hpf   WBC, UA 0-5 0 - 5 WBC/hpf   Bacteria, UA NONE SEEN NONE SEEN   Squamous Epithelial / LPF 0-5 (A) NONE SEEN   Hyaline Casts, UA PRESENT   Troponin I  Result Value Ref Range   Troponin I <0.03 <0.03 ng/mL      Assessment & Plan:   Problem List Items Addressed This Visit    On home oxygen therapy    Stable, chronic hypercapnic respiratory failure in setting of COPD, on supplemental O2 and benefiting from therapy - Request order for portable oxygen concentrator  Plan: 1. Order for portable oxygen concentrator, continuous 2-3L overnight, and with activity, to be faxed to [facility]      Relevant Orders   For home use only DME oxygen   Hypokalemia    Increase to potassium 65mEq daily for now DC HCTZ Cannot re-check lab today in afternoon, declined [facility] lab draw, reviewed return criteria      Relevant Medications   potassium chloride SA (K-DUR,KLOR-CON) 20 MEQ tablet   History of CVA with residual deficit     Chronic problem, with interval improvement in weakness 1 year ago from [facility] PT, but then gradual decline again mostly with limited activity - Continue full dose ASA 325mg  daily - Continue HH - Order walker through [facility]      Relevant Orders   For home use only DME 4 wheeled rolling walker with seat ([id number])   COPD (chronic obstructive pulmonary disease) with emphysema ([place])   Relevant Orders   For home use only DME oxygen   Chronic respiratory failure ([place])    End  Stage COPD with complication chronic hypercapnic respiratory failure on supplemental O2 3L PRN activity and nightly - Had been followed by [facility] for med supplies/oxygen - Followed by [place] [facility]  Plan: 1. Order walker and portable oxygen concentrator from [facility], orders to be faxed 2. Continue O2 and follow-up Pulm      Relevant Orders   For home use only DME oxygen   Central pain syndrome (CPS) (Chronic)    Switch Gabapentin capsules to tablets 600mg  BID and may have PRN mid day dose Switch Flexeril to Baclofen, less sedation Follow-up as needed if not improved, limited options with multiple chronic illnesses, low BP, may consider palliative care      Relevant Medications   gabapentin (NEURONTIN) 600 MG tablet   Benign hypertension with CKD (chronic kidney disease), stage II    Controlled HTN, now concern low BP and falls / near syncope Still low BP CKD-II-III  Plan: 1. Stop combo pill due to recall - off Valsartan, Lisinopril 2. Continue Amlodipine, Hydralazine, Metoprolol - may adjust in future, does not have pill bottles 3. Monitor BP outside office 4. Follow-up PRN      Relevant Medications   amLODipine (NORVASC) 10 MG tablet   potassium chloride SA (K-DUR,KLOR-CON) 20 MEQ tablet    Other Visit Diagnoses    Acute conjunctivitis of right eye, unspecified acute conjunctivitis type    -  Primary   Relevant Medications   trimethoprim-polymyxin b (POLYTRIM) ophthalmic solution   Needs flu shot        Relevant Orders   Flu Vaccine QUAD 36+ mos IM (Completed)      Meds ordered this encounter  Medications  . amLODipine (NORVASC) 10 MG tablet    Sig: Take 1 tablet (10 mg total) by mouth daily.    Dispense:  90 tablet    Refill:  3  . potassium chloride SA (K-DUR,KLOR-CON) 20 MEQ tablet    Sig: Take 1 tablet (20 mEq total) by mouth daily.    Dispense:  30 tablet    Refill:  5  . trimethoprim-polymyxin b (POLYTRIM) ophthalmic solution    Sig: Place 1 drop into the right eye every 4 (four) hours. For 7 days    Dispense:  10 mL    Refill:  0  . DISCONTD: baclofen (LIORESAL) 10 MG tablet    Sig: Take 0.5-1 tablets (5-10 mg total) by mouth 3 (three) times daily as needed for muscle spasms.    Dispense:  30 each    Refill:  1  . gabapentin (NEURONTIN) 600 MG tablet    Sig: Take one tablet twice daily, if need additional dose may take one in afternoon    Dispense:  90 tablet    Refill:  3    Follow up plan: Return in about 3 months (around 07/19/2017) for COPD, Chronic Pain.  [name], [facility] 04/20/2017, 12:58 AM

## 2017-04-20 NOTE — Assessment & Plan Note (Addendum)
Increase to potassium 44mEq daily for now DC HCTZ Cannot re-check lab today in afternoon, declined [facility] lab draw, reviewed return criteria

## 2017-04-20 NOTE — Assessment & Plan Note (Signed)
Switch Gabapentin capsules to tablets 600mg  BID and may have PRN mid day dose Switch Flexeril to Baclofen, less sedation Follow-up as needed if not improved, limited options with multiple chronic illnesses, low BP, may consider palliative care

## 2017-04-20 NOTE — Assessment & Plan Note (Signed)
Stable, chronic hypercapnic respiratory failure in setting of COPD, on supplemental O2 and benefiting from therapy - Request order for portable oxygen concentrator  Plan: 1. Order for portable oxygen concentrator, continuous 2-3L overnight, and with activity, to be faxed to [facility]

## 2017-04-20 NOTE — Assessment & Plan Note (Signed)
Chronic problem, with interval improvement in weakness 1 year ago from [facility] PT, but then gradual decline again mostly with limited activity - Continue full dose ASA 325mg  daily - Continue HH - Order walker through [facility]

## 2017-04-20 NOTE — Assessment & Plan Note (Signed)
End Stage COPD with complication chronic hypercapnic respiratory failure on supplemental O2 3L PRN activity and nightly - Had been followed by [facility] for med supplies/oxygen - Followed by [place] [facility]  Plan: 1. Order walker and portable oxygen concentrator from [facility], orders to be faxed 2. Continue O2 and follow-up Pulm

## 2017-04-20 NOTE — Assessment & Plan Note (Signed)
Controlled HTN, now concern low BP and falls / near syncope Still low BP CKD-II-III  Plan: 1. Stop combo pill due to recall - off Valsartan, Lisinopril 2. Continue Amlodipine, Hydralazine, Metoprolol - may adjust in future, does not have pill bottles 3. Monitor BP outside office 4. Follow-up PRN

## 2017-05-02 ENCOUNTER — Other Ambulatory Visit: Payer: Self-pay | Admitting: Family Medicine

## 2017-05-12 ENCOUNTER — Other Ambulatory Visit: Payer: Self-pay | Admitting: Family Medicine

## 2017-05-12 ENCOUNTER — Other Ambulatory Visit: Payer: Self-pay

## 2017-05-12 DIAGNOSIS — E876 Hypokalemia: Secondary | ICD-10-CM

## 2017-05-31 ENCOUNTER — Encounter: Payer: Self-pay | Admitting: Emergency Medicine

## 2017-05-31 ENCOUNTER — Emergency Department: Payer: Medicare Other

## 2017-05-31 DIAGNOSIS — W010XXA Fall on same level from slipping, tripping and stumbling without subsequent striking against object, initial encounter: Secondary | ICD-10-CM | POA: Insufficient documentation

## 2017-05-31 DIAGNOSIS — R55 Syncope and collapse: Secondary | ICD-10-CM | POA: Insufficient documentation

## 2017-05-31 DIAGNOSIS — Z8673 Personal history of transient ischemic attack (TIA), and cerebral infarction without residual deficits: Secondary | ICD-10-CM | POA: Diagnosis not present

## 2017-05-31 DIAGNOSIS — F17210 Nicotine dependence, cigarettes, uncomplicated: Secondary | ICD-10-CM | POA: Insufficient documentation

## 2017-05-31 DIAGNOSIS — Y92002 Bathroom of unspecified non-institutional (private) residence single-family (private) house as the place of occurrence of the external cause: Secondary | ICD-10-CM | POA: Diagnosis not present

## 2017-05-31 DIAGNOSIS — J449 Chronic obstructive pulmonary disease, unspecified: Secondary | ICD-10-CM | POA: Insufficient documentation

## 2017-05-31 DIAGNOSIS — I10 Essential (primary) hypertension: Secondary | ICD-10-CM | POA: Diagnosis not present

## 2017-05-31 DIAGNOSIS — Y999 Unspecified external cause status: Secondary | ICD-10-CM | POA: Insufficient documentation

## 2017-05-31 DIAGNOSIS — S42214A Unspecified nondisplaced fracture of surgical neck of right humerus, initial encounter for closed fracture: Secondary | ICD-10-CM | POA: Insufficient documentation

## 2017-05-31 DIAGNOSIS — Z7982 Long term (current) use of aspirin: Secondary | ICD-10-CM | POA: Diagnosis not present

## 2017-05-31 DIAGNOSIS — Y93E8 Activity, other personal hygiene: Secondary | ICD-10-CM | POA: Insufficient documentation

## 2017-05-31 DIAGNOSIS — Z79899 Other long term (current) drug therapy: Secondary | ICD-10-CM | POA: Insufficient documentation

## 2017-05-31 LAB — BASIC METABOLIC PANEL
Anion gap: 11 (ref 5–15)
BUN: 15 mg/dL (ref 6–20)
CO2: 26 mmol/L (ref 22–32)
Calcium: 8.9 mg/dL (ref 8.9–10.3)
Chloride: 94 mmol/L — ABNORMAL LOW (ref 101–111)
Creatinine, Ser: 1.06 mg/dL (ref 0.61–1.24)
GFR calc Af Amer: >60 mL/min (ref >60)
GFR calc non Af Amer: >60 mL/min (ref >60)
Glucose, Bld: 125 mg/dL — ABNORMAL HIGH (ref 65–99)
Potassium: 3.6 mmol/L (ref 3.5–5.1)
Sodium: 131 mmol/L — ABNORMAL LOW (ref 135–145)

## 2017-05-31 LAB — GLUCOSE, CAPILLARY: Glucose-Capillary: 110 mg/dL — ABNORMAL HIGH (ref 65–99)

## 2017-05-31 NOTE — ED Triage Notes (Signed)
Pt reports he was walking with his walker reports felt dizzy and and felt forward with his walker. Pt reports he fell about 1pm this afternoon reports has increased since then it happened denies any other symptoms at present

## 2017-06-01 ENCOUNTER — Emergency Department: Payer: Medicare Other

## 2017-06-01 ENCOUNTER — Emergency Department
Admission: EM | Admit: 2017-06-01 | Discharge: 2017-06-01 | Disposition: A | Discharge status: Home / Self Care | Payer: Medicare Other | Attending: Emergency Medicine | Admitting: Emergency Medicine

## 2017-06-01 DIAGNOSIS — S42214A Unspecified nondisplaced fracture of surgical neck of right humerus, initial encounter for closed fracture: Secondary | ICD-10-CM

## 2017-06-01 DIAGNOSIS — R55 Syncope and collapse: Secondary | ICD-10-CM

## 2017-06-01 LAB — CBC
HCT: 37.3 % — ABNORMAL LOW (ref 40.0–52.0)
Hemoglobin: 12.7 g/dL — ABNORMAL LOW (ref 13.0–18.0)
MCH: 29.9 pg (ref 26.0–34.0)
MCHC: 33.9 g/dL (ref 32.0–36.0)
MCV: 88.2 fL (ref 80.0–100.0)
Platelets: 200 10*3/uL (ref 150–440)
RBC: 4.23 MIL/uL — ABNORMAL LOW (ref 4.40–5.90)
RDW: 14.8 % — ABNORMAL HIGH (ref 11.5–14.5)
WBC: 9.6 10*3/uL (ref 3.8–10.6)

## 2017-06-01 LAB — TROPONIN I
Troponin I: <0.03 ng/mL (ref <0.03)
Troponin I: <0.03 ng/mL (ref <0.03)

## 2017-06-01 LAB — ETHANOL: Alcohol, Ethyl (B): <10 mg/dL (ref <10)

## 2017-06-01 MED ORDER — SODIUM CHLORIDE 0.9 % IV BOLUS (SEPSIS)
1000.0000 mL | Freq: Once | INTRAVENOUS | Status: AC
  Administered 2017-06-01: 1000 mL via INTRAVENOUS

## 2017-06-01 MED ORDER — OXYCODONE-ACETAMINOPHEN 5-325 MG PO TABS: 1.0000 | ORAL_TABLET | Freq: Four times a day (QID) | ORAL | 0 refills | Status: DC | PRN

## 2017-06-01 MED ORDER — OXYCODONE-ACETAMINOPHEN 5-325 MG PO TABS
1.0000 | ORAL_TABLET | Freq: Once | ORAL | Status: AC
  Administered 2017-06-01: 1 via ORAL
  Filled 2017-06-01: qty 1

## 2017-06-01 MED ORDER — IPRATROPIUM-ALBUTEROL 0.5-2.5 (3) MG/3ML IN SOLN
3.0000 mL | Freq: Once | RESPIRATORY_TRACT | Status: AC
  Administered 2017-06-01: 3 mL via RESPIRATORY_TRACT
  Filled 2017-06-01: qty 3

## 2017-06-01 NOTE — ED Notes (Signed)
Pt returned from CT °

## 2017-06-01 NOTE — ED Notes (Signed)
Pt is not in there room.

## 2017-06-01 NOTE — Discharge Instructions (Signed)
Please follow up with orthopedics and with your primary care physician

## 2017-06-01 NOTE — ED Provider Notes (Signed)
[facility] Emergency Department Provider Note   ____________________________________________   First MD Initiated Contact with Patient 06/01/17 0234     (approximate)  I have reviewed the triage vital signs and the nursing notes.   HISTORY  Chief Complaint Shoulder Pain and Near Syncope    HPI Deborah Dondero. is a 61 y.o. male who comes into the hospital today thinking that he broke his shoulder. The patient reports that he fell this morning. He states that he blacked out. His arm got twisted up under him and now he's having pain. He reports that he was going to the bathroom when he blacked out. He reports that he passes out every so often.the patient reports that he was only out for seconds. He didn't his head but he is not sure where. The patient rates his pain a 9 out of 10 in intensity and is on the right side of his arm. The patient denies any chest pain and shortness of breath and blurred vision any nausea or vomiting. He is here today for evaluation of his symptoms. He states that he does have some baseline right-sided weakness from a previous stroke.   Past Medical History:  Diagnosis Date  . Bipolar disorder ([place])   . COPD (chronic obstructive pulmonary disease) ([place])    on home oxygen.   [name] GERD (gastroesophageal reflux disease)   . Hyperlipidemia   . Hypertension   . Insomnia, controlled   . Obesity   . Oxygen deficiency    2 LNC at night  . Shortness of breath   . Stroke [facility]) 2014   R sided hemiplegia  . Substance abuse ([place]) 2016   cocaine use.    Patient Active Problem List   Diagnosis Date Noted  . Hypokalemia 04/19/2017  . GERD (gastroesophageal reflux disease) 12/10/2015  . 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 Right 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) ([place]) 10/27/2015  . Abnormal MRI of head 10/27/2015  . Memory loss 07/25/2015  . Faintness 07/25/2015  . On  home oxygen therapy 04/02/2015  . Bipolar 1 disorder, depressed ([place]) 01/27/2015  . Pulmonary nodule 01/27/2015  . Blood glucose elevated 01/27/2015  . Restless leg 01/27/2015  . Compulsive tobacco user syndrome 01/27/2015  . Arthritis of knee, degenerative 01/27/2015  . Benign hypertension with CKD (chronic kidney disease), stage II 01/27/2015  . Cocaine abuse ([place]) 01/27/2015  . COPD (chronic obstructive pulmonary disease) with emphysema ([place]) 01/27/2015  . Chronic respiratory failure ([place]) 11/12/2014  . Recurrent falls 05/29/2014  . Cervical pain 05/29/2014  . Bursitis of elbow 05/29/2014  . Elbow pain 05/29/2014  . Dislocated shoulder 05/21/2014  . Lung mass 05/21/2014  . Dyslipidemia 10/11/2013  . History of CVA with residual deficit 09/28/2013    Past Surgical History:  Procedure Laterality Date  . CARPAL TUNNEL RELEASE    . TONSILLECTOMY      Prior to Admission medications   Medication Sig Start Date End Date Taking? Authorizing Provider  albuterol (PROVENTIL) (2.5 MG/3ML) 0.083% nebulizer solution USE 1 VIAL VIA NEBULIZER EVERY 6 HOURS AS NEEDED FOR WHEEZING OR SHORTNESS OF BREATH 09/26/15   Krebs, Genevie Cheshire, NP  albuterol (VENTOLIN HFA) 108 (90 Base) MCG/ACT inhaler Inhale 2 puffs into the lungs 4 (four) times daily as needed. 07/12/16   Karamalegos, Devonne Doughty, DO  amLODipine (NORVASC) 10 MG tablet Take 1 tablet (10 mg total) by mouth daily. 04/19/17   Olin Hauser, DO  aspirin 325 MG EC tablet Take 325 mg by mouth daily.    [provider]  atorvastatin (LIPITOR) 40 MG tablet Take 1 tablet (40 mg total) by mouth daily. 09/06/16   Karamalegos, Devonne Doughty, DO  baclofen (LIORESAL) 10 MG tablet TAKE 1/2-1 TABLET BY MOUTH THREE TIMES DAILY AS NEEDED FOR MUSCLE SPASMS 05/02/17   Karamalegos, Devonne Doughty, DO  Brexpiprazole (REXULTI) 2 MG TABS Take 2 mg by mouth at bedtime.    [provider]  chlorhexidine (HIBICLENS) 4 % external liquid Apply 1  application topically 2 (two) times daily.    [provider]  divalproex (DEPAKOTE) 250 MG DR tablet TAKE 2 TABLETS BY MOUTH TWICE DAILY 04/07/16   [provider]  DULoxetine (CYMBALTA) 30 MG capsule TK 3 CS PO QAM 06/20/15   [provider]  Fluticasone-Salmeterol (ADVAIR DISKUS) 250-50 MCG/DOSE AEPB USE 1 INHALATION BY MOUTH TWICE DAILY 08/05/16   Parks Ranger, Devonne Doughty, DO  gabapentin (NEURONTIN) 600 MG tablet Take one tablet twice daily, if need additional dose may take one in afternoon 04/19/17   Karamalegos, Devonne Doughty, DO  hydrALAZINE (APRESOLINE) 25 MG tablet Take 1 tablet (25 mg total) by mouth 3 (three) times daily. 12/10/16   Karamalegos, Devonne Doughty, DO  ibuprofen (ADVIL,MOTRIN) 400 MG tablet TAKE 1 TABLET(400 MG) BY MOUTH TWICE DAILY 09/30/15   Krebs, Genevie Cheshire, NP  isosorbide mononitrate (IMDUR) 30 MG 24 hr tablet Take 1 tablet (30 mg total) by mouth daily. 09/07/16   Karamalegos, Devonne Doughty, DO  metoprolol tartrate (LOPRESSOR) 50 MG tablet Take 0.5 tablets (25 mg total) by mouth 2 (two) times daily. 02/17/17   Karamalegos, Devonne Doughty, DO  nitroGLYCERIN (NITROSTAT) 0.3 MG SL tablet Place 1 tablet (0.3 mg total) under the tongue every 5 (five) minutes as needed for chest pain. 10/11/13   Angiulli, Lavon Paganini, PA-C  omeprazole (PRILOSEC) 20 MG capsule TAKE 1 CAPSULE BY MOUTH TWICE DAILY 03/06/17   Parks Ranger, Devonne Doughty, DO  oxyCODONE-acetaminophen (ROXICET) 5-325 MG tablet Take 1 tablet by mouth every 6 (six) hours as needed. 06/01/17   Loney Hering, MD  OXYGEN 2 L by Does not apply route at bedtime.    [provider]  potassium chloride SA (K-DUR,KLOR-CON) 20 MEQ tablet Take 1 tablet (20 mEq total) by mouth daily. 04/19/17   Karamalegos, Devonne Doughty, DO  traZODone (DESYREL) 50 MG tablet Take by mouth. Reported on 10/27/2015    [provider]  trimethoprim-polymyxin b (POLYTRIM) ophthalmic solution Place 1 drop into the right eye every 4  (four) hours. For 7 days 04/19/17   Olin Hauser, DO    Allergies Quetiapine; Seroquel [quetiapine fumarate]; and Codeine  Family History  Problem Relation Age of Onset  . CAD Mother   . Hypertension Mother   . Stroke Mother   . Lung disease Father     Social History Social History  Substance Use Topics  . Smoking status: Current Every Day Smoker    Packs/day: 0.50    Years: 45.00    Types: Cigarettes  . Smokeless tobacco: Current User     Comment: smoking 10-15 cigs daily   . Alcohol use No    Review of Systems  Constitutional: No fever/chills Eyes: No visual changes. ENT: No sore throat. Cardiovascular: Denies chest pain. Respiratory: Denies shortness of breath. Gastrointestinal: No abdominal pain.  No nausea, no vomiting.  No diarrhea.  No constipation. Genitourinary: Negative for dysuria. Musculoskeletal: right arm pain Skin: Negative for rash.  Neurological: syncope   ____________________________________________   PHYSICAL EXAM:  VITAL SIGNS: ED Triage Vitals  Enc Vitals Group     BP 05/31/17 2200 117/67     Pulse Rate 05/31/17 2200 69     Resp 05/31/17 2200 (!) 2     Temp 05/31/17 2200 97.7 F (36.5 C)     Temp Source 05/31/17 2200 Oral     SpO2 05/31/17 2200 99 %     Weight 05/31/17 2201 160 lb (72.6 kg)     Height 05/31/17 2201 5\' 8"  (1.727 m)     Head Circumference --      Peak Flow --      Pain Score 05/31/17 2200 9     Pain Loc --      Pain Edu? --      Excl. in [place]? --     Constitutional: Alert and oriented. disheveled appearing and in moderate distress. Eyes: Conjunctivae are normal. PERRL. EOMI. Head: Atraumatic. Nose: No congestion/rhinnorhea. Mouth/Throat: Mucous membranes are moist.  Oropharynx non-erythematous. Neck:  cervical spine tenderness to palpation. Cardiovascular: Normal rate, regular rhythm. Grossly normal heart sounds.  Good peripheral circulation. Respiratory: Normal respiratory effort.  No retractions.  mild expiratory wheezes in all lung fields Gastrointestinal: Soft and nontender. No distention. positive bowel sounds Musculoskeletal: tenderness to palpation of right upper extremity   Neurologic:  Normal speech and language.  Skin:  Skin is warm, dry and intact.  Psychiatric: Mood and affect are normal.   ____________________________________________   LABS (all labs ordered are listed, but only abnormal results are displayed)  Labs Reviewed  BASIC METABOLIC PANEL - Abnormal; Notable for the following:       Result Value   Sodium 131 (*)    Chloride 94 (*)    Glucose, Bld 125 (*)    All other components within normal limits  CBC - Abnormal; Notable for the following:    RBC 4.23 (*)    Hemoglobin 12.7 (*)    HCT 37.3 (*)    RDW 14.8 (*)    All other components within normal limits  GLUCOSE, CAPILLARY - Abnormal; Notable for the following:    Glucose-Capillary 110 (*)    All other components within normal limits  TROPONIN I  ETHANOL  TROPONIN I   ____________________________________________  EKG  ED ECG REPORT I, Loney Hering, the attending physician, personally viewed and interpreted this ECG.   Date: 05/31/2017  EKG Time: 2208  Rate: 80  Rhythm: normal sinus rhythm  Axis: normal  Intervals:none  ST&T Change: none  ____________________________________________  RADIOLOGY  Dg Shoulder Right  Result Date: 05/31/2017 CLINICAL DATA:  Initial evaluation for acute trauma, fall. EXAM: RIGHT SHOULDER - 2+ VIEW COMPARISON:  None. FINDINGS: Subtle linear lucency with cortical irregularity extending through the neck of the right humerus, suspicious for acute nondisplaced fracture. Humeral head in normal alignment with the glenoid. AC joint grossly approximated. Visualize right hemithorax intact. Visualized soft tissues within normal limits. IMPRESSION: Subtle linear lucencies with cortical irregularity involving the right humeral neck, suspicious for acute  nondisplaced fracture. Electronically Signed   By: Jeannine Boga M.D.   On: 05/31/2017 22:49   Ct Head Wo Contrast  Result Date: 06/01/2017 CLINICAL DATA:  Dizziness, fall with walker. History of hypertension, hyperlipidemia, substance abuse. EXAM: CT HEAD WITHOUT CONTRAST CT CERVICAL SPINE WITHOUT CONTRAST TECHNIQUE: Multidetector CT imaging of the head and cervical spine was performed following the standard protocol without intravenous contrast. Multiplanar CT image reconstructions  of the cervical spine were also generated. COMPARISON:  CT HEAD Dec 20, 2013 FINDINGS: CT HEAD FINDINGS BRAIN: No intraparenchymal hemorrhage, mass effect nor midline shift. Old LEFT basal ganglia infarct with mild ex vacuo dilatation LEFT lateral ventricle. No hydrocephalus. Patchy supratentorial white matter hypodensities. No acute large vascular territory infarcts. No abnormal extra-axial fluid collections. Basal cisterns are patent. VASCULAR: Mild calcific atherosclerosis of the carotid siphons. SKULL: No skull fracture. No significant scalp soft tissue swelling. SINUSES/ORBITS: The mastoid air-cells and included paranasal sinuses are well-aerated.The included ocular globes and orbital contents are non-suspicious. OTHER: Patient is edentulous. Subluxed for cystic dislocated LEFT mandible condyles. CT CERVICAL SPINE FINDINGS ALIGNMENT: Straightened lordosis. Vertebral bodies in alignment. SKULL BASE AND VERTEBRAE: Cervical vertebral bodies and posterior elements are intact. Moderate C6-7 disc height loss with endplate spurring compatible with degenerative discs. C1-2 articulation maintained with mild arthropathy. Calcified apical ligament with spurring. Severe LEFT C4-5 facet arthropathy with minimal widening most compatible with inflammatory effusion. SOFT TISSUES AND SPINAL CANAL: Nonacute. Mild calcific atherosclerosis carotid bifurcations. DISC LEVELS: No significant osseous canal stenosis. Moderate C6-7 neural  foraminal narrowing. UPPER CHEST: Lung apices are clear. OTHER: None. IMPRESSION: CT HEAD: 1. No acute intracranial process. 2. Mild chronic small vessel ischemic disease and old LEFT basal ganglia infarct. CT CERVICAL SPINE: 1. No acute fracture or malalignment. 2. Moderate C6-7 neural foraminal narrowing. Electronically Signed   By: Elon Alas M.D.   On: 06/01/2017 03:32   Ct Cervical Spine Wo Contrast  Result Date: 06/01/2017 CLINICAL DATA:  Dizziness, fall with walker. History of hypertension, hyperlipidemia, substance abuse. EXAM: CT HEAD WITHOUT CONTRAST CT CERVICAL SPINE WITHOUT CONTRAST TECHNIQUE: Multidetector CT imaging of the head and cervical spine was performed following the standard protocol without intravenous contrast. Multiplanar CT image reconstructions of the cervical spine were also generated. COMPARISON:  CT HEAD Dec 20, 2013 FINDINGS: CT HEAD FINDINGS BRAIN: No intraparenchymal hemorrhage, mass effect nor midline shift. Old LEFT basal ganglia infarct with mild ex vacuo dilatation LEFT lateral ventricle. No hydrocephalus. Patchy supratentorial white matter hypodensities. No acute large vascular territory infarcts. No abnormal extra-axial fluid collections. Basal cisterns are patent. VASCULAR: Mild calcific atherosclerosis of the carotid siphons. SKULL: No skull fracture. No significant scalp soft tissue swelling. SINUSES/ORBITS: The mastoid air-cells and included paranasal sinuses are well-aerated.The included ocular globes and orbital contents are non-suspicious. OTHER: Patient is edentulous. Subluxed for cystic dislocated LEFT mandible condyles. CT CERVICAL SPINE FINDINGS ALIGNMENT: Straightened lordosis. Vertebral bodies in alignment. SKULL BASE AND VERTEBRAE: Cervical vertebral bodies and posterior elements are intact. Moderate C6-7 disc height loss with endplate spurring compatible with degenerative discs. C1-2 articulation maintained with mild arthropathy. Calcified apical  ligament with spurring. Severe LEFT C4-5 facet arthropathy with minimal widening most compatible with inflammatory effusion. SOFT TISSUES AND SPINAL CANAL: Nonacute. Mild calcific atherosclerosis carotid bifurcations. DISC LEVELS: No significant osseous canal stenosis. Moderate C6-7 neural foraminal narrowing. UPPER CHEST: Lung apices are clear. OTHER: None. IMPRESSION: CT HEAD: 1. No acute intracranial process. 2. Mild chronic small vessel ischemic disease and old LEFT basal ganglia infarct. CT CERVICAL SPINE: 1. No acute fracture or malalignment. 2. Moderate C6-7 neural foraminal narrowing. Electronically Signed   By: Elon Alas M.D.   On: 06/01/2017 03:32    ____________________________________________   PROCEDURES  Procedure(s) performed: None  Procedures  Critical Care performed: No  ____________________________________________   INITIAL IMPRESSION / ASSESSMENT AND PLAN / ED COURSE  As part of my medical decision making, I reviewed the following  data within the electronic MEDICAL RECORD NUMBER Notes from prior ED visits and [place] Controlled Substance Database   This is a 61 year old male who comes into the hospital today after having a syncopal episode. The patient reports that he has had syncopal episodes in the past. He denies any associated complaints such as chest pain shortness of breath dizziness lightheadedness. The patient is also concerned that he does have a arm fracture.  The patient's differential diagnosis includes vasovagal syncope, cardiogenic syncope, neurogenic syncope with a muscular skeletal pain versus fracture.  I did check some blood work on the patient which was unremarkable. His sodium was 131 and his dad and was unremarkable. The patient's white count is normal and he had 2 troponins checked that were negative. The patient's ethanol was also negative. the patient had a CT head showed some mild chronic small vessel disease and old left basal ganglia infarct. He  also had some neural foraminal narrowing. The patient did have an x-ray of his shoulder that showed some linear lucencies with a concern for nondisplaced acute fracture of the surgical humeral neck.  The patient did receive some Percocet for his pain as well as a DuoNeb. Also give the patient liter of normal saline. We monitored the patient in the emergency department. I feel that he may have had some vasovagal syncope as he was on the toilet when he fell. The patient also felt more than 12 hours prior to his arrival. The patient's EKG is unremarkable and his blood work is unremarkable. I will place the patient a sling and discharged and have him follow-up with orthopedic surgery. The patient has no further complaints or concerns at this time. He'll be discharged home.      ____________________________________________   FINAL CLINICAL IMPRESSION(S) / ED DIAGNOSES  Final diagnoses:  Syncope, unspecified syncope type  Closed nondisplaced fracture of surgical neck of right humerus, unspecified fracture morphology, initial encounter      NEW MEDICATIONS STARTED DURING THIS VISIT:  Discharge Medication List as of 06/01/2017  6:01 AM    START taking these medications   Details  oxyCODONE-acetaminophen (ROXICET) 5-325 MG tablet Take 1 tablet by mouth every 6 (six) hours as needed., Starting Wed 06/01/2017, Print         Note:  This document was prepared using Dragon voice recognition software and may include unintentional dictation errors.    Loney Hering, MD 06/01/17 [phone]

## 2017-07-14 ENCOUNTER — Other Ambulatory Visit: Payer: Self-pay | Admitting: Family Medicine

## 2017-07-14 DIAGNOSIS — E876 Hypokalemia: Secondary | ICD-10-CM

## 2017-08-02 ENCOUNTER — Other Ambulatory Visit: Payer: Self-pay | Admitting: Family Medicine

## 2017-08-02 DIAGNOSIS — J432 Centrilobular emphysema: Secondary | ICD-10-CM

## 2017-09-02 ENCOUNTER — Other Ambulatory Visit: Payer: Self-pay | Admitting: Family Medicine

## 2017-09-02 DIAGNOSIS — J432 Centrilobular emphysema: Secondary | ICD-10-CM

## 2017-09-05 ENCOUNTER — Other Ambulatory Visit: Payer: Self-pay | Admitting: Family Medicine

## 2017-09-05 DIAGNOSIS — K219 Gastro-esophageal reflux disease without esophagitis: Secondary | ICD-10-CM

## 2017-09-05 DIAGNOSIS — E785 Hyperlipidemia, unspecified: Secondary | ICD-10-CM

## 2017-09-07 ENCOUNTER — Other Ambulatory Visit: Payer: Self-pay | Admitting: Family Medicine

## 2017-09-07 DIAGNOSIS — G890 Central pain syndrome: Secondary | ICD-10-CM

## 2017-09-20 ENCOUNTER — Emergency Department: Payer: Medicare Other

## 2017-09-20 ENCOUNTER — Encounter: Payer: Self-pay | Admitting: Emergency Medicine

## 2017-09-20 ENCOUNTER — Inpatient Hospital Stay
Admission: EM | Admit: 2017-09-20 | Discharge: 2017-09-24 | DRG: 481 | Disposition: A | Discharge status: Home Health | Payer: Medicare Other | Attending: Internal Medicine | Admitting: Internal Medicine

## 2017-09-20 DIAGNOSIS — E871 Hypo-osmolality and hyponatremia: Secondary | ICD-10-CM | POA: Diagnosis present

## 2017-09-20 DIAGNOSIS — E785 Hyperlipidemia, unspecified: Secondary | ICD-10-CM | POA: Diagnosis present

## 2017-09-20 DIAGNOSIS — S72141A Displaced intertrochanteric fracture of right femur, initial encounter for closed fracture: Principal | ICD-10-CM | POA: Diagnosis present

## 2017-09-20 DIAGNOSIS — I69351 Hemiplegia and hemiparesis following cerebral infarction affecting right dominant side: Secondary | ICD-10-CM

## 2017-09-20 DIAGNOSIS — Z885 Allergy status to narcotic agent status: Secondary | ICD-10-CM

## 2017-09-20 DIAGNOSIS — W19XXXA Unspecified fall, initial encounter: Secondary | ICD-10-CM | POA: Diagnosis present

## 2017-09-20 DIAGNOSIS — J449 Chronic obstructive pulmonary disease, unspecified: Secondary | ICD-10-CM | POA: Diagnosis present

## 2017-09-20 DIAGNOSIS — Z7982 Long term (current) use of aspirin: Secondary | ICD-10-CM

## 2017-09-20 DIAGNOSIS — S72009A Fracture of unspecified part of neck of unspecified femur, initial encounter for closed fracture: Secondary | ICD-10-CM

## 2017-09-20 DIAGNOSIS — E876 Hypokalemia: Secondary | ICD-10-CM | POA: Diagnosis present

## 2017-09-20 DIAGNOSIS — I959 Hypotension, unspecified: Secondary | ICD-10-CM | POA: Diagnosis not present

## 2017-09-20 DIAGNOSIS — Z888 Allergy status to other drugs, medicaments and biological substances status: Secondary | ICD-10-CM

## 2017-09-20 DIAGNOSIS — Z79899 Other long term (current) drug therapy: Secondary | ICD-10-CM

## 2017-09-20 DIAGNOSIS — Z9981 Dependence on supplemental oxygen: Secondary | ICD-10-CM

## 2017-09-20 DIAGNOSIS — K219 Gastro-esophageal reflux disease without esophagitis: Secondary | ICD-10-CM | POA: Diagnosis present

## 2017-09-20 DIAGNOSIS — F319 Bipolar disorder, unspecified: Secondary | ICD-10-CM | POA: Diagnosis present

## 2017-09-20 DIAGNOSIS — Z419 Encounter for procedure for purposes other than remedying health state, unspecified: Secondary | ICD-10-CM

## 2017-09-20 DIAGNOSIS — Z66 Do not resuscitate: Secondary | ICD-10-CM | POA: Diagnosis not present

## 2017-09-20 DIAGNOSIS — S72001A Fracture of unspecified part of neck of right femur, initial encounter for closed fracture: Secondary | ICD-10-CM

## 2017-09-20 DIAGNOSIS — F17210 Nicotine dependence, cigarettes, uncomplicated: Secondary | ICD-10-CM | POA: Diagnosis present

## 2017-09-20 DIAGNOSIS — D62 Acute posthemorrhagic anemia: Secondary | ICD-10-CM | POA: Diagnosis not present

## 2017-09-20 DIAGNOSIS — Z8781 Personal history of (healed) traumatic fracture: Secondary | ICD-10-CM | POA: Diagnosis present

## 2017-09-20 DIAGNOSIS — I10 Essential (primary) hypertension: Secondary | ICD-10-CM | POA: Diagnosis present

## 2017-09-20 DIAGNOSIS — Z993 Dependence on wheelchair: Secondary | ICD-10-CM

## 2017-09-20 NOTE — ED Triage Notes (Signed)
Pt arrived to ED via EMS from home where EMS reports pt was walking without usual assisting devices, lost balance and fell onto his right hip (hardwood floor.) Pt has hx/o COPD, daily smoker, 4L of O2 (non-compliant.) MD at bedside for further eval.

## 2017-09-21 ENCOUNTER — Emergency Department: Payer: Medicare Other

## 2017-09-21 ENCOUNTER — Inpatient Hospital Stay: Payer: Medicare Other

## 2017-09-21 ENCOUNTER — Inpatient Hospital Stay: Payer: Medicare Other | Admitting: Anesthesiology

## 2017-09-21 ENCOUNTER — Encounter: Payer: Self-pay | Admitting: Anesthesiology

## 2017-09-21 ENCOUNTER — Other Ambulatory Visit: Payer: Self-pay

## 2017-09-21 ENCOUNTER — Encounter
Admission: EM | Disposition: A | Discharge status: Home Health | Payer: Self-pay | Source: Home / Self Care | Attending: Internal Medicine

## 2017-09-21 DIAGNOSIS — J449 Chronic obstructive pulmonary disease, unspecified: Secondary | ICD-10-CM | POA: Diagnosis present

## 2017-09-21 DIAGNOSIS — E871 Hypo-osmolality and hyponatremia: Secondary | ICD-10-CM | POA: Diagnosis present

## 2017-09-21 DIAGNOSIS — Z79899 Other long term (current) drug therapy: Secondary | ICD-10-CM | POA: Diagnosis not present

## 2017-09-21 DIAGNOSIS — Z7982 Long term (current) use of aspirin: Secondary | ICD-10-CM | POA: Diagnosis not present

## 2017-09-21 DIAGNOSIS — Z8781 Personal history of (healed) traumatic fracture: Secondary | ICD-10-CM | POA: Diagnosis present

## 2017-09-21 DIAGNOSIS — Z885 Allergy status to narcotic agent status: Secondary | ICD-10-CM | POA: Diagnosis not present

## 2017-09-21 DIAGNOSIS — E785 Hyperlipidemia, unspecified: Secondary | ICD-10-CM | POA: Diagnosis present

## 2017-09-21 DIAGNOSIS — Z888 Allergy status to other drugs, medicaments and biological substances status: Secondary | ICD-10-CM | POA: Diagnosis not present

## 2017-09-21 DIAGNOSIS — S72141A Displaced intertrochanteric fracture of right femur, initial encounter for closed fracture: Secondary | ICD-10-CM | POA: Diagnosis present

## 2017-09-21 DIAGNOSIS — K219 Gastro-esophageal reflux disease without esophagitis: Secondary | ICD-10-CM | POA: Diagnosis present

## 2017-09-21 DIAGNOSIS — W19XXXA Unspecified fall, initial encounter: Secondary | ICD-10-CM | POA: Diagnosis present

## 2017-09-21 DIAGNOSIS — Z9981 Dependence on supplemental oxygen: Secondary | ICD-10-CM | POA: Diagnosis not present

## 2017-09-21 DIAGNOSIS — F17210 Nicotine dependence, cigarettes, uncomplicated: Secondary | ICD-10-CM | POA: Diagnosis present

## 2017-09-21 DIAGNOSIS — Z993 Dependence on wheelchair: Secondary | ICD-10-CM | POA: Diagnosis not present

## 2017-09-21 DIAGNOSIS — E876 Hypokalemia: Secondary | ICD-10-CM | POA: Diagnosis present

## 2017-09-21 DIAGNOSIS — D62 Acute posthemorrhagic anemia: Secondary | ICD-10-CM | POA: Diagnosis not present

## 2017-09-21 DIAGNOSIS — F319 Bipolar disorder, unspecified: Secondary | ICD-10-CM | POA: Diagnosis present

## 2017-09-21 DIAGNOSIS — I959 Hypotension, unspecified: Secondary | ICD-10-CM | POA: Diagnosis not present

## 2017-09-21 DIAGNOSIS — I10 Essential (primary) hypertension: Secondary | ICD-10-CM | POA: Diagnosis present

## 2017-09-21 DIAGNOSIS — I69351 Hemiplegia and hemiparesis following cerebral infarction affecting right dominant side: Secondary | ICD-10-CM | POA: Diagnosis not present

## 2017-09-21 DIAGNOSIS — Z66 Do not resuscitate: Secondary | ICD-10-CM | POA: Diagnosis not present

## 2017-09-21 HISTORY — PX: INTRAMEDULLARY (IM) NAIL INTERTROCHANTERIC: SHX5875

## 2017-09-21 LAB — COMPREHENSIVE METABOLIC PANEL
ALT: 9 U/L — ABNORMAL LOW (ref 17–63)
AST: 17 U/L (ref 15–41)
Albumin: 3.4 g/dL — ABNORMAL LOW (ref 3.5–5.0)
Alkaline Phosphatase: 89 U/L (ref 38–126)
Anion gap: 9 (ref 5–15)
BUN: 10 mg/dL (ref 6–20)
CO2: 25 mmol/L (ref 22–32)
Calcium: 8.4 mg/dL — ABNORMAL LOW (ref 8.9–10.3)
Chloride: 93 mmol/L — ABNORMAL LOW (ref 101–111)
Creatinine, Ser: 1.02 mg/dL (ref 0.61–1.24)
GFR calc Af Amer: >60 mL/min (ref >60)
GFR calc non Af Amer: >60 mL/min (ref >60)
Glucose, Bld: 138 mg/dL — ABNORMAL HIGH (ref 65–99)
Potassium: 3.2 mmol/L — ABNORMAL LOW (ref 3.5–5.1)
Sodium: 127 mmol/L — ABNORMAL LOW (ref 135–145)
Total Bilirubin: 0.5 mg/dL (ref 0.3–1.2)
Total Protein: 6.6 g/dL (ref 6.5–8.1)

## 2017-09-21 LAB — CBC
HCT: 32.5 % — ABNORMAL LOW (ref 40.0–52.0)
Hemoglobin: 11.1 g/dL — ABNORMAL LOW (ref 13.0–18.0)
MCH: 29.8 pg (ref 26.0–34.0)
MCHC: 34.2 g/dL (ref 32.0–36.0)
MCV: 87.1 fL (ref 80.0–100.0)
Platelets: 232 10*3/uL (ref 150–440)
RBC: 3.73 MIL/uL — ABNORMAL LOW (ref 4.40–5.90)
RDW: 14.4 % (ref 11.5–14.5)
WBC: 6.3 10*3/uL (ref 3.8–10.6)

## 2017-09-21 LAB — URINE DRUG SCREEN, QUALITATIVE (ARMC ONLY)
Amphetamines, Ur Screen: NOT DETECTED
Barbiturates, Ur Screen: NOT DETECTED
Benzodiazepine, Ur Scrn: NOT DETECTED
Cannabinoid 50 Ng, Ur ~~LOC~~: NOT DETECTED
Cocaine Metabolite,Ur ~~LOC~~: NOT DETECTED
MDMA (Ecstasy)Ur Screen: NOT DETECTED
Methadone Scn, Ur: NOT DETECTED
Opiate, Ur Screen: POSITIVE — AB
Phencyclidine (PCP) Ur S: NOT DETECTED
Tricyclic, Ur Screen: NOT DETECTED

## 2017-09-21 LAB — TSH: TSH: 1.591 u[IU]/mL (ref 0.350–4.500)

## 2017-09-21 LAB — SURGICAL PCR SCREEN
MRSA, PCR: NEGATIVE
Staphylococcus aureus: POSITIVE — AB

## 2017-09-21 SURGERY — FIXATION, FRACTURE, INTERTROCHANTERIC, WITH INTRAMEDULLARY ROD
Anesthesia: Spinal | Site: Hip | Laterality: Right | Wound class: Clean

## 2017-09-21 MED ORDER — PROPOFOL 10 MG/ML IV BOLUS
INTRAVENOUS | Status: DC | PRN
  Administered 2017-09-21: 40 mg via INTRAVENOUS
  Administered 2017-09-21: 30 mg via INTRAVENOUS

## 2017-09-21 MED ORDER — LIDOCAINE HCL (PF) 2 % IJ SOLN
INTRAMUSCULAR | Status: AC
  Filled 2017-09-21: qty 10

## 2017-09-21 MED ORDER — METOPROLOL TARTRATE 25 MG PO TABS
25.0000 mg | ORAL_TABLET | Freq: Two times a day (BID) | ORAL | Status: DC
  Administered 2017-09-21 (×2): 25 mg via ORAL
  Filled 2017-09-21 (×3): qty 1

## 2017-09-21 MED ORDER — OXYCODONE-ACETAMINOPHEN 5-325 MG PO TABS: 1.0000 | ORAL_TABLET | Freq: Four times a day (QID) | ORAL | Status: DC | PRN

## 2017-09-21 MED ORDER — MORPHINE SULFATE (PF) 4 MG/ML IV SOLN
4.0000 mg | Freq: Once | INTRAVENOUS | Status: AC
  Administered 2017-09-21: 4 mg via INTRAVENOUS
  Filled 2017-09-21: qty 1

## 2017-09-21 MED ORDER — MIDAZOLAM HCL 2 MG/2ML IJ SOLN
INTRAMUSCULAR | Status: AC
  Filled 2017-09-21: qty 2

## 2017-09-21 MED ORDER — METOCLOPRAMIDE HCL 10 MG PO TABS: 5.0000 mg | ORAL_TABLET | Freq: Three times a day (TID) | ORAL | Status: DC | PRN

## 2017-09-21 MED ORDER — DULOXETINE HCL 60 MG PO CPEP
90.0000 mg | ORAL_CAPSULE | Freq: Every day | ORAL | Status: DC
  Administered 2017-09-22 – 2017-09-24 (×3): 90 mg via ORAL
  Filled 2017-09-21 (×4): qty 1

## 2017-09-21 MED ORDER — FENTANYL CITRATE (PF) 100 MCG/2ML IJ SOLN
INTRAMUSCULAR | Status: DC | PRN
  Administered 2017-09-21: 25 ug via INTRAVENOUS
  Administered 2017-09-21: 50 ug via INTRAVENOUS
  Administered 2017-09-21: 25 ug via INTRAVENOUS

## 2017-09-21 MED ORDER — ONDANSETRON HCL 4 MG/2ML IJ SOLN
4.0000 mg | Freq: Once | INTRAMUSCULAR | Status: AC
  Administered 2017-09-21: 4 mg via INTRAVENOUS
  Filled 2017-09-21: qty 2

## 2017-09-21 MED ORDER — MAGNESIUM HYDROXIDE 400 MG/5ML PO SUSP
30.0000 mL | Freq: Every day | ORAL | Status: DC | PRN
  Administered 2017-09-22 – 2017-09-23 (×2): 30 mL via ORAL
  Filled 2017-09-21 (×3): qty 30

## 2017-09-21 MED ORDER — DIVALPROEX SODIUM 500 MG PO DR TAB
500.0000 mg | DELAYED_RELEASE_TABLET | Freq: Two times a day (BID) | ORAL | Status: DC
  Administered 2017-09-21 – 2017-09-24 (×6): 500 mg via ORAL
  Filled 2017-09-21 (×8): qty 1

## 2017-09-21 MED ORDER — NEOMYCIN-POLYMYXIN B GU 40-200000 IR SOLN
Status: DC | PRN
  Administered 2017-09-21: 2 mL

## 2017-09-21 MED ORDER — BREXPIPRAZOLE 1 MG PO TABS
2.0000 mg | ORAL_TABLET | Freq: Every day | ORAL | Status: DC
  Administered 2017-09-21 – 2017-09-23 (×3): 2 mg via ORAL
  Filled 2017-09-21 (×4): qty 2

## 2017-09-21 MED ORDER — FENTANYL CITRATE (PF) 100 MCG/2ML IJ SOLN
INTRAMUSCULAR | Status: AC
  Filled 2017-09-21: qty 2

## 2017-09-21 MED ORDER — KETOROLAC TROMETHAMINE 15 MG/ML IJ SOLN
15.0000 mg | Freq: Once | INTRAMUSCULAR | Status: AC
  Administered 2017-09-21: 15 mg via INTRAVENOUS

## 2017-09-21 MED ORDER — BISACODYL 10 MG RE SUPP
10.0000 mg | Freq: Every day | RECTAL | Status: DC | PRN
  Administered 2017-09-24: 10 mg via RECTAL
  Filled 2017-09-21 (×2): qty 1

## 2017-09-21 MED ORDER — ONDANSETRON HCL 4 MG/2ML IJ SOLN: 4.0000 mg | Freq: Four times a day (QID) | INTRAMUSCULAR | Status: DC | PRN

## 2017-09-21 MED ORDER — KCL IN DEXTROSE-NACL 20-5-0.9 MEQ/L-%-% IV SOLN
INTRAVENOUS | Status: DC
  Administered 2017-09-21: 21:00:00 via INTRAVENOUS
  Filled 2017-09-21 (×4): qty 1000

## 2017-09-21 MED ORDER — FLEET ENEMA 7-19 GM/118ML RE ENEM: 1.0000 | ENEMA | Freq: Once | RECTAL | Status: DC | PRN

## 2017-09-21 MED ORDER — METOCLOPRAMIDE HCL 5 MG/ML IJ SOLN: 5.0000 mg | Freq: Three times a day (TID) | INTRAMUSCULAR | Status: DC | PRN

## 2017-09-21 MED ORDER — SODIUM CHLORIDE 0.9 % IV SOLN
INTRAVENOUS | Status: DC
  Administered 2017-09-21: 05:00:00 via INTRAVENOUS

## 2017-09-21 MED ORDER — CHLORHEXIDINE GLUCONATE CLOTH 2 % EX PADS
6.0000 | MEDICATED_PAD | Freq: Every day | CUTANEOUS | Status: DC
  Administered 2017-09-21 – 2017-09-22 (×2): 6 via TOPICAL

## 2017-09-21 MED ORDER — OXYCODONE HCL 5 MG PO TABS
5.0000 mg | ORAL_TABLET | ORAL | Status: DC | PRN
  Administered 2017-09-21 – 2017-09-24 (×11): 5 mg via ORAL
  Filled 2017-09-21 (×13): qty 1

## 2017-09-21 MED ORDER — MUPIROCIN 2 % EX OINT
1.0000 "application " | TOPICAL_OINTMENT | Freq: Two times a day (BID) | CUTANEOUS | Status: DC
  Administered 2017-09-21 – 2017-09-24 (×7): 1 via NASAL
  Filled 2017-09-21: qty 22

## 2017-09-21 MED ORDER — LIDOCAINE HCL (CARDIAC) 20 MG/ML IV SOLN
INTRAVENOUS | Status: DC | PRN
  Administered 2017-09-21: 50 mg via INTRAVENOUS

## 2017-09-21 MED ORDER — DIPHENHYDRAMINE HCL 12.5 MG/5ML PO ELIX: 12.5000 mg | ORAL_SOLUTION | ORAL | Status: DC | PRN

## 2017-09-21 MED ORDER — ONDANSETRON HCL 4 MG/2ML IJ SOLN: 4.0000 mg | Freq: Once | INTRAMUSCULAR | Status: DC | PRN

## 2017-09-21 MED ORDER — ONDANSETRON HCL 4 MG PO TABS: 4.0000 mg | ORAL_TABLET | Freq: Four times a day (QID) | ORAL | Status: DC | PRN

## 2017-09-21 MED ORDER — NITROGLYCERIN 0.4 MG SL SUBL: 0.4000 mg | SUBLINGUAL_TABLET | SUBLINGUAL | Status: DC | PRN

## 2017-09-21 MED ORDER — MORPHINE SULFATE (PF) 4 MG/ML IV SOLN
4.0000 mg | Freq: Once | INTRAVENOUS | Status: AC
  Administered 2017-09-21: 4 mg via INTRAVENOUS

## 2017-09-21 MED ORDER — FENTANYL CITRATE (PF) 100 MCG/2ML IJ SOLN: 25.0000 ug | INTRAMUSCULAR | Status: DC | PRN

## 2017-09-21 MED ORDER — BREXPIPRAZOLE 2 MG PO TABS: 2.0000 mg | ORAL_TABLET | Freq: Every day | ORAL | Status: DC

## 2017-09-21 MED ORDER — ACETAMINOPHEN 325 MG PO TABS: 650.0000 mg | ORAL_TABLET | ORAL | Status: DC | PRN

## 2017-09-21 MED ORDER — ISOSORBIDE MONONITRATE ER 30 MG PO TB24
30.0000 mg | ORAL_TABLET | Freq: Every day | ORAL | Status: DC
  Filled 2017-09-21 (×2): qty 1

## 2017-09-21 MED ORDER — DIPHENHYDRAMINE HCL 50 MG/ML IJ SOLN: 12.5000 mg | Freq: Four times a day (QID) | INTRAMUSCULAR | Status: DC | PRN

## 2017-09-21 MED ORDER — ACETAMINOPHEN 650 MG RE SUPP: 650.0000 mg | RECTAL | Status: DC | PRN

## 2017-09-21 MED ORDER — DOCUSATE SODIUM 100 MG PO CAPS
100.0000 mg | ORAL_CAPSULE | Freq: Two times a day (BID) | ORAL | Status: DC
  Administered 2017-09-21 – 2017-09-23 (×5): 100 mg via ORAL
  Filled 2017-09-21 (×5): qty 1

## 2017-09-21 MED ORDER — ALBUTEROL SULFATE (2.5 MG/3ML) 0.083% IN NEBU: 2.5000 mg | INHALATION_SOLUTION | Freq: Four times a day (QID) | RESPIRATORY_TRACT | Status: DC | PRN

## 2017-09-21 MED ORDER — MIDAZOLAM HCL 5 MG/5ML IJ SOLN
INTRAMUSCULAR | Status: DC | PRN
  Administered 2017-09-21: 2 mg via INTRAVENOUS

## 2017-09-21 MED ORDER — POTASSIUM CHLORIDE CRYS ER 20 MEQ PO TBCR
20.0000 meq | EXTENDED_RELEASE_TABLET | Freq: Every day | ORAL | Status: DC
  Administered 2017-09-22 – 2017-09-24 (×3): 20 meq via ORAL
  Filled 2017-09-21 (×3): qty 1

## 2017-09-21 MED ORDER — MORPHINE SULFATE (PF) 4 MG/ML IV SOLN
INTRAVENOUS | Status: AC
  Administered 2017-09-21: 4 mg via INTRAVENOUS
  Filled 2017-09-21: qty 1

## 2017-09-21 MED ORDER — ACETAMINOPHEN 650 MG RE SUPP: 650.0000 mg | Freq: Four times a day (QID) | RECTAL | Status: DC | PRN

## 2017-09-21 MED ORDER — BUPIVACAINE-EPINEPHRINE (PF) 0.5% -1:200000 IJ SOLN
INTRAMUSCULAR | Status: DC | PRN
  Administered 2017-09-21: 30 mL via PERINEURAL

## 2017-09-21 MED ORDER — SODIUM CHLORIDE 0.9 % IV SOLN
INTRAVENOUS | Status: DC | PRN
  Administered 2017-09-21: 15 ug/min via INTRAVENOUS

## 2017-09-21 MED ORDER — ACETAMINOPHEN 325 MG PO TABS: 650.0000 mg | ORAL_TABLET | Freq: Four times a day (QID) | ORAL | Status: DC | PRN

## 2017-09-21 MED ORDER — CEFAZOLIN SODIUM-DEXTROSE 2-4 GM/100ML-% IV SOLN
2.0000 g | Freq: Four times a day (QID) | INTRAVENOUS | Status: AC
  Administered 2017-09-21 – 2017-09-22 (×3): 2 g via INTRAVENOUS
  Filled 2017-09-21 (×3): qty 100

## 2017-09-21 MED ORDER — BACLOFEN 10 MG PO TABS
10.0000 mg | ORAL_TABLET | Freq: Three times a day (TID) | ORAL | Status: DC | PRN
  Administered 2017-09-24: 10 mg via ORAL
  Filled 2017-09-21 (×2): qty 1

## 2017-09-21 MED ORDER — ACETAMINOPHEN 500 MG PO TABS
1000.0000 mg | ORAL_TABLET | Freq: Four times a day (QID) | ORAL | Status: AC
  Administered 2017-09-21 – 2017-09-22 (×4): 1000 mg via ORAL
  Filled 2017-09-21 (×4): qty 2

## 2017-09-21 MED ORDER — KETOROLAC TROMETHAMINE 15 MG/ML IJ SOLN
7.5000 mg | Freq: Four times a day (QID) | INTRAMUSCULAR | Status: AC
  Administered 2017-09-22 (×3): 7.5 mg via INTRAVENOUS
  Filled 2017-09-21 (×4): qty 1

## 2017-09-21 MED ORDER — OXYCODONE HCL 5 MG PO TABS
10.0000 mg | ORAL_TABLET | ORAL | Status: DC | PRN
  Administered 2017-09-22: 10 mg via ORAL

## 2017-09-21 MED ORDER — LACTATED RINGERS IV SOLN
INTRAVENOUS | Status: DC | PRN
  Administered 2017-09-21: 18:00:00 via INTRAVENOUS

## 2017-09-21 MED ORDER — HYDROMORPHONE HCL 1 MG/ML IJ SOLN
INTRAMUSCULAR | Status: AC
  Filled 2017-09-21: qty 1

## 2017-09-21 MED ORDER — MOMETASONE FURO-FORMOTEROL FUM 200-5 MCG/ACT IN AERO
2.0000 | INHALATION_SPRAY | Freq: Two times a day (BID) | RESPIRATORY_TRACT | Status: DC
  Administered 2017-09-21 – 2017-09-24 (×6): 2 via RESPIRATORY_TRACT
  Filled 2017-09-21: qty 8.8

## 2017-09-21 MED ORDER — MORPHINE SULFATE (PF) 4 MG/ML IV SOLN
4.0000 mg | INTRAVENOUS | Status: DC | PRN
  Administered 2017-09-21: 4 mg via INTRAVENOUS
  Filled 2017-09-21: qty 1

## 2017-09-21 MED ORDER — ATORVASTATIN CALCIUM 20 MG PO TABS
40.0000 mg | ORAL_TABLET | Freq: Every day | ORAL | Status: DC
  Administered 2017-09-22 – 2017-09-23 (×2): 40 mg via ORAL
  Filled 2017-09-21 (×2): qty 2

## 2017-09-21 MED ORDER — PROPOFOL 500 MG/50ML IV EMUL
INTRAVENOUS | Status: AC
  Filled 2017-09-21: qty 50

## 2017-09-21 MED ORDER — SODIUM CHLORIDE 0.9 % IV BOLUS (SEPSIS)
1000.0000 mL | Freq: Once | INTRAVENOUS | Status: AC
  Administered 2017-09-21: 1000 mL via INTRAVENOUS

## 2017-09-21 MED ORDER — CEFAZOLIN SODIUM-DEXTROSE 2-4 GM/100ML-% IV SOLN
2.0000 g | INTRAVENOUS | Status: AC
  Administered 2017-09-21: 2 g via INTRAVENOUS
  Filled 2017-09-21: qty 100

## 2017-09-21 MED ORDER — PANTOPRAZOLE SODIUM 40 MG PO TBEC
40.0000 mg | DELAYED_RELEASE_TABLET | Freq: Two times a day (BID) | ORAL | Status: DC
Start: 2017-09-21 — End: 2017-09-24
  Administered 2017-09-21 – 2017-09-24 (×6): 40 mg via ORAL
  Filled 2017-09-21 (×6): qty 1

## 2017-09-21 MED ORDER — HYDROMORPHONE HCL 1 MG/ML IJ SOLN
1.0000 mg | Freq: Once | INTRAMUSCULAR | Status: AC
  Administered 2017-09-21: 1 mg via INTRAVENOUS

## 2017-09-21 MED ORDER — PROPOFOL 500 MG/50ML IV EMUL
INTRAVENOUS | Status: DC | PRN
  Administered 2017-09-21: 30 ug/kg/min via INTRAVENOUS

## 2017-09-21 MED ORDER — NITROGLYCERIN 0.3 MG SL SUBL: 0.3000 mg | SUBLINGUAL_TABLET | SUBLINGUAL | Status: DC | PRN

## 2017-09-21 MED ORDER — ENOXAPARIN SODIUM 40 MG/0.4ML ~~LOC~~ SOLN
40.0000 mg | SUBCUTANEOUS | Status: DC
  Administered 2017-09-22: 40 mg via SUBCUTANEOUS
  Filled 2017-09-21: qty 0.4

## 2017-09-21 SURGICAL SUPPLY — 39 items
BIT DRILL 4.3MMS DISTAL GRDTED (BIT) ×1 IMPLANT
BNDG COHESIVE 4X5 TAN STRL (GAUZE/BANDAGES/DRESSINGS) ×2 IMPLANT
BNDG COHESIVE 6X5 TAN STRL LF (GAUZE/BANDAGES/DRESSINGS) ×2 IMPLANT
CANISTER SUCT 1200ML W/VALVE (MISCELLANEOUS) ×2 IMPLANT
CHLORAPREP W/TINT 26ML (MISCELLANEOUS) ×4 IMPLANT
DRAPE C-ARMOR (DRAPES) ×2 IMPLANT
DRAPE SHEET LG 3/4 BI-LAMINATE (DRAPES) ×2 IMPLANT
DRILL 4.3MMS DISTAL GRADUATED (BIT) ×2
DRSG OPSITE POSTOP 3X4 (GAUZE/BANDAGES/DRESSINGS) ×6 IMPLANT
DRSG OPSITE POSTOP 4X6 (GAUZE/BANDAGES/DRESSINGS) ×2 IMPLANT
ELECT CAUTERY BLADE 6.4 (BLADE) ×2 IMPLANT
ELECT REM PT RETURN 9FT ADLT (ELECTROSURGICAL) ×2
ELECTRODE REM PT RTRN 9FT ADLT (ELECTROSURGICAL) ×1 IMPLANT
GAUZE SPONGE 4X4 12PLY STRL (GAUZE/BANDAGES/DRESSINGS) ×2 IMPLANT
GLOVE BIO SURGEON STRL SZ8 (GLOVE) ×4 IMPLANT
GLOVE INDICATOR 8.0 STRL GRN (GLOVE) ×2 IMPLANT
GOWN STRL REUS W/ TWL LRG LVL3 (GOWN DISPOSABLE) ×1 IMPLANT
GOWN STRL REUS W/ TWL XL LVL3 (GOWN DISPOSABLE) ×1 IMPLANT
GOWN STRL REUS W/TWL LRG LVL3 (GOWN DISPOSABLE) ×1
GOWN STRL REUS W/TWL XL LVL3 (GOWN DISPOSABLE) ×1
GUIDEPIN VERSANAIL DSP 3.2X444 (ORTHOPEDIC DISPOSABLE SUPPLIES) ×2 IMPLANT
GUIDEWIRE BALL NOSE 100CM (WIRE) ×2 IMPLANT
HFN RH 130 DEG 11MM X 360MM (Orthopedic Implant) ×2 IMPLANT
MAT BLUE FLOOR 46X72 FLO (MISCELLANEOUS) ×2 IMPLANT
NEEDLE FILTER BLUNT 18X 1/2SAF (NEEDLE) ×1
NEEDLE FILTER BLUNT 18X1 1/2 (NEEDLE) ×1 IMPLANT
NEEDLE HYPO 22GX1.5 SAFETY (NEEDLE) ×2 IMPLANT
NS IRRIG 500ML POUR BTL (IV SOLUTION) ×2 IMPLANT
PACK HIP COMPR (MISCELLANEOUS) ×2 IMPLANT
SCREW BONE CORTICAL 5.0X40 (Screw) ×2 IMPLANT
SCREW LAG HIP NAIL 10.5X95 (Screw) ×2 IMPLANT
STAPLER SKIN PROX 35W (STAPLE) ×2 IMPLANT
STRAP SAFETY 5IN WIDE (MISCELLANEOUS) ×2 IMPLANT
SUT VIC AB 0 CT1 36 (SUTURE) ×2 IMPLANT
SUT VIC AB 1 CT1 36 (SUTURE) ×2 IMPLANT
SUT VIC AB 2-0 CT1 (SUTURE) ×4 IMPLANT
SYR 10ML LL (SYRINGE) ×2 IMPLANT
SYR 30ML LL (SYRINGE) ×2 IMPLANT
TAPE MICROFOAM 4IN (TAPE) ×2 IMPLANT

## 2017-09-21 NOTE — Plan of Care (Signed)
Bladder scan showed 575 and only able to get 350 out with I/O cath.  Dr. Ree Kida of retention and possible BPH issues. Fluids reduced to 75/hr per orders and sample sent to lab for Urine drug screen.

## 2017-09-21 NOTE — Anesthesia Procedure Notes (Signed)
Performed by: Taralynn Quiett, CRNA Pre-anesthesia Checklist: Patient identified, Emergency Drugs available, Suction available, Patient being monitored and Timeout performed Oxygen Delivery Method: Nasal cannula       

## 2017-09-21 NOTE — Plan of Care (Addendum)
Patient IV patent. Consents x2 signed.  All personal items removed.  NPO since 7pm 2/12.  Drug screen completed and CHG x2. Metoprolol given with sip of water.

## 2017-09-21 NOTE — Progress Notes (Signed)
Family Meeting Note  Advance Directive:yes  Today a meeting took place with the Patient.  The following clinical team members were present during this meeting:MD  The following were discussed:Patient's diagnosis:   62 year old patient with past medical history of COPD, stroke, hypertension, cocaine use and bipolar disorder admitted for right femur fracture after a fall  Patient's progosis: > 12 months and Goals for treatment: DNR  Additional follow-up to be provided: None  Time spent during discussion:16 minutes  Max Sane, MD

## 2017-09-21 NOTE — Anesthesia Post-op Follow-up Note (Signed)
Anesthesia QCDR form completed.        

## 2017-09-21 NOTE — Transfer of Care (Signed)
Immediate Anesthesia Transfer of Care Note  Patient: David Hall.  Procedure(s) Performed: INTRAMEDULLARY (IM) NAIL INTERTROCHANTRIC (Right Hip)  Patient Location: PACU  Anesthesia Type:Spinal  Level of Consciousness: drowsy and patient cooperative  Airway & Oxygen Therapy: Patient Spontanous Breathing and Patient connected to face mask oxygen  Post-op Assessment: Report given to RN and Post -op Vital signs reviewed and stable  Post vital signs: Reviewed and stable  Last Vitals:  Vitals:   09/21/17 0842 09/21/17 1031  BP: 110/64 122/72  Pulse: 65 68  Resp: 18   Temp: (!) 36.4 C   SpO2: 99%     Last Pain:  Vitals:   09/21/17 0842  TempSrc: Oral  PainSc:          Complications: No apparent anesthesia complications

## 2017-09-21 NOTE — Care Management Note (Signed)
Case Management Note  Patient Details  Name: David S Fehrman Jr. MRN: [id number] Date of Birth: 04/13/1956  Subjective/Objective:                   RNCM met with patient prior to orthopedic procedure. He states he "lives with family in [place]".  He plans to return home at discharge. He has a rolling walker available for use at home per patient.  He uses [facility] [phone].  He [facility] O2 [facility]. He was not wearing O2 when I visited with him.   Action/Plan:  Home health list provided RNCM will follow.   Expected Discharge Date:                  Expected Discharge Plan:     In-House Referral:     Discharge planning Services  CM Consult  Post Acute Care Choice:  Home Health Choice offered to:  Patient  DME Arranged:    DME Agency:     HH Arranged:    HH Agency:     Status of Service:  In process, will continue to follow  If discussed at Long Length of Stay Meetings, dates discussed:    Additional Comments:   [place], RN 09/21/2017, 1:46 PM  

## 2017-09-21 NOTE — Progress Notes (Signed)
Same Day rounding progress note  This is a 62 year old male admitted for hip fracture.  1.  Preop medical clearance: Patient is medically clear for planned surgery for Right femur intertrochanteric fracture.  Manage pain.  Traction and preoperative instructions per orthopedic service. -Low to moderate risk, please note at baseline patient has had a stroke with residual right-sided paralysis and is wheelchair-bound.  May not have much functional recovery even after surgery 2.  Hypertension: Controlled; continue metoprolol. hold amlodipine, the patient's ARB and diuretic prior to surgery.  May resume postoperatively if necessary. 3.  COPD: Stable; continue inhaled corticosteroid.  Albuterol as needed. 4.  Bipolar disorder: Continue Rexulti and Cymbalta 5.  History of stroke: I held the patient's aspirin prior to surgery.  Resume postoperatively. 6.  DVT prophylaxis: SCDs for now.  Start Lovenox postoperatively 7.  GI prophylaxis: PPI per home regimen  Time spent: 10 mins

## 2017-09-21 NOTE — Op Note (Signed)
09/21/2017  7:15 PM  Patient:   David Hall.  Pre-Op Diagnosis:   Closed displaced 2 part intertrochanteric fracture right hip.  Post-Op Diagnosis:   Same.  Procedure:   Reduction and internal fixation of displaced 2 part intertrochanteric right hip fracture with Biomet Affixis TFN nail.  Surgeon:   Pascal Lux, MD  Assistant:   None  Anesthesia:   Spinal  Findings:   As above  Complications:   None  EBL:   125 cc  Fluids:   700 cc crystalloid  UOP:   None  TT:   None  Drains:   None  Closure:   Staples  Implants:   Biomet Affixis 11 x 360 mm TFN with a 95 mm lag screw and a 40 mm distal interlocking screw  Brief Clinical Note:   The patient is a 62 year old male with multiple medical problems who sustained the above-noted injury last evening when he fell. He was brought to the emergency room where x-rays demonstrated the above-noted fracture. The patient has been cleared medically and presents at this time for reduction and internal fixation of the displaced intertrochanteric right hip fracture.  Procedure:   The patient was brought into the operating room. After adequate spinal anesthesia was obtained, the patient was lain in the supine position on the fracture table. The uninjured leg was placed in a flexed and abducted position while the injured lower extremity was placed in longitudinal traction. The fracture was reduced using longitudinal traction and internal rotation. The adequacy of reduction was verified fluoroscopically in AP and lateral projections and found to be near anatomic. The lateral aspects of the right hip and thigh were prepped with ChloraPrep solution before being draped sterilely. Preoperative antibiotics were administered. A timeout was performed to verify the appropriate surgical site. The greater trochanter was identified fluoroscopically and an approximately 3 cm incision made about 2-3 fingerbreadths above the tip of the greater  trochanter. The incision was carried down through the subcutaneous tissues to expose the gluteal fascia. This was split the length of the incision, providing access to the tip of the trochanter. Under fluoroscopic guidance, a guidewire was drilled through the tip of the trochanter into the proximal metaphysis to the level of the lesser trochanter. After verifying its position fluoroscopically in AP and lateral projections, it was overreamed with the initial reamer to the depth of the lesser trochanter. A guidewire was passed down through the femoral canal to the supracondylar region. The adequacy of guidewire position was verified fluoroscopically in AP and lateral projections before the length of the guidewire within the canal was measured and found to be 380 mm. Therefore, a 360 mm length nail was selected. The guidewire was overreamed sequentially using the flexible reamers, beginning with a 10.5 mm reamer and progressing to a 12.5 mm reamer. This provided good cortical chatter. The 11 x 360 mm Biomet Affixis TFN rod was selected and advanced to the appropriate depth, as verified fluoroscopically.   The guide system for the lag screw was positioned and advanced through an approximately 2 cm stab incision over the lateral aspect of the proximal femur. The guidewire was drilled up through the trochanteric femoral nail and into the femoral neck to rest within 5 mm of subchondral bone. After verifying its position in the femoral neck and head in both AP and lateral projections, the guidewire was measured and found to be optimally replicated by a 95 mm lag screw. The guidewire was overreamed to the  appropriate depth before the lag screw was inserted and advanced to the appropriate depth as verified fluoroscopically in AP and lateral projections. The locking screw was advanced, then backed off a quarter turn to set the lag screw. Again the adequacy of hardware position and fracture reduction was verified  fluoroscopically in AP and lateral projections and found to be excellent.  Attention was directed distally. Using the "perfect circle" technique, the leg and fluoroscopy machine were positioned appropriately. An approximately 1.5 cm stab incision was made over the skin at the appropriate point before the drill bit was advanced through the cortex and across the static hole of the nail. The appropriate length of the screw was determined before the 40 mm distal interlocking screw was positioned, then advanced and tightened securely. Again the adequacy of screw position was verified fluoroscopically in AP and lateral projections and found to be excellent.  The wounds were irrigated thoroughly with sterile saline solution before the abductor fascia was reapproximated using #0 Vicryl interrupted sutures. The subcutaneous tissues were closed using 2-0 Vicryl interrupted sutures. The skin was closed using staples. A total of 30 cc of 0.5% Sensorcaine with epinephrine was injected in and around all incisions. Sterile occlusive dressings were applied to all wounds before the patient was transferred back to his/her hospital bed. The patient was then transferred to the recovery room in satisfactory condition after tolerating the procedure well.

## 2017-09-21 NOTE — Clinical Social Work Note (Signed)
Clinical Social Work Assessment  Patient Details  Name: David Hall. MRN: [id number] Date of Birth: 1956-03-30  Date of referral:  09/21/17               Reason for consult:  Facility Placement                Permission sought to share information with:    Permission granted to share information::     Name::        Agency::     Relationship::     Contact Information:     Housing/Transportation Living arrangements for the past 2 months:  Single Family Home Source of Information:  Patient Patient Interpreter Needed:  None Criminal Activity/Legal Involvement Pertinent to Current Situation/Hospitalization:  No - Comment as needed Significant Relationships:  Friend Lives with:  Friends Do you feel safe going back to the place where you live?  Yes Need for family participation in patient care:  Yes (Comment)  Care giving concerns:  Patient lives in [place] with his friend [name].    Social Worker assessment / plan:  Holiday representative (CSW) reviewed chart and noted that patient has a hip fracture and surgery is pending. CSW met with patient today and he was alone at bedside. Patient was alert and oriented X4 and was laying in the bed. CSW introduced self and explained role of CSW department. Patient reported that he lives with his friend [name] and has an aide that comes in 7 days per week to take care of him. Patient reported that he primarily uses a wheel chair. CSW explained that PT will evaluate patient and make a recommendation of home health or SNF. CSW explained SNF process. Patient refused SNF and stated that he wants to go home. RN case manager aware of above. CSW will continue to follow and assist as needed.   Employment status:  Disabled (Comment on whether or not currently receiving Disability) Insurance information:  Medicaid In [place], [employer] PT Recommendations:  Not assessed at this time Information / Referral to community resources:  [place]  Patient/Family's Response to care:  Patient prefers to go home.   Patient/Family's Understanding of and Emotional Response to Diagnosis, Current Treatment, and Prognosis:  Patient was pleasant and thanked CSW for visit.   Emotional Assessment Appearance:  Appears stated age Attitude/Demeanor/Rapport:    Affect (typically observed):  Accepting, Adaptable, Quiet Orientation:  Oriented to Self, Oriented to Place, Oriented to  Time, Oriented to Situation Alcohol / Substance use:  Illicit Drugs(History of cocaine use. ) Psych involvement (Current and /or in the community):  No (Comment)  Discharge Needs  Concerns to be addressed:  Discharge Planning Concerns Readmission within the last 30 days:  No Current discharge risk:  Dependent with Mobility, Chronically ill Barriers to Discharge:  Continued Medical Work up   [employer], Veronia Beets, LCSW 09/21/2017, 4:03 PM

## 2017-09-21 NOTE — H&P (Signed)
David Hall. is an 62 y.o. male.   Chief Complaint: Fall HPI: The patient with past medical history of COPD, stroke, hypertension, cocaine use and bipolar disorder presents to the emergency department after a fall.  The patient states that he tripped and was unable to stand afterward.  X-ray of the patient's right hip demonstrated intertrochanteric fracture of the femur.  Orthopedic service was consulted and the hospitalist service was called for admission.  Past Medical History:  Diagnosis Date  . Bipolar disorder ([place])   . COPD (chronic obstructive pulmonary disease) ([place])    on home oxygen.   [name] GERD (gastroesophageal reflux disease)   . Hyperlipidemia   . Hypertension   . Insomnia, controlled   . Obesity   . Oxygen deficiency    2 LNC at night  . Shortness of breath   . Stroke [facility] 2014   R sided hemiplegia  . Substance abuse ([place]) 2016   cocaine use.    Past Surgical History:  Procedure Laterality Date  . CARPAL TUNNEL RELEASE    . TONSILLECTOMY      Family History  Problem Relation Age of Onset  . CAD Mother   . Hypertension Mother   . Stroke Mother   . Lung disease Father    Social History:  reports that he has been smoking cigarettes.  He has a 22.50 pack-year smoking history. He uses smokeless tobacco. He reports that he does not drink alcohol or use drugs.  Allergies:  Allergies  Allergen Reactions  . Quetiapine Other (See Comments)    "feels like someone is sticking pins in my legs." "makes me feel like someone is sticking needles and knives in my legs"  . Seroquel [Quetiapine Fumarate] Other (See Comments)    "feels like someone is sticking pins in my legs."  . Codeine Itching and Rash    Prior to Admission medications   Medication Sig Start Date End Date Taking? Authorizing Provider  Amlodipine-Valsartan-HCTZ 10-160-12.5 MG TABS Take 1 tablet by mouth daily. 09/06/17  Yes [provider]  aspirin 325 MG EC tablet Take 325 mg by mouth  daily.    Yes [provider]  atorvastatin (LIPITOR) 40 MG tablet TAKE 1 TABLET BY MOUTH DAILY. 09/06/17  Yes Karamalegos, Devonne Doughty, DO  baclofen (LIORESAL) 10 MG tablet TAKE 1/2-1 TABLET BY MOUTH THREE TIMES DAILY AS NEEDED FOR MUSCLE SPASMS 05/02/17  Yes Karamalegos, Devonne Doughty, DO  Brexpiprazole (REXULTI) 2 MG TABS Take 2 mg by mouth at bedtime.   Yes [provider]  DULoxetine (CYMBALTA) 30 MG capsule Take 90 mg by mouth daily.   Yes [provider]  gabapentin (NEURONTIN) 600 MG tablet TAKE 1 TABLET BY MOUTH TWICE DAILY, IF NEEDED MAY TAKE AN ADDITIONAL DOSE IN THE AFTERNOON. 09/07/17  Yes Karamalegos, Devonne Doughty, DO  isosorbide mononitrate (IMDUR) 30 MG 24 hr tablet TAKE 1 TABLET BY MOUTH DAILY 09/06/17  Yes Karamalegos, Devonne Doughty, DO  metoprolol tartrate (LOPRESSOR) 50 MG tablet Take 0.5 tablets (25 mg total) by mouth 2 (two) times daily. Patient taking differently: Take 50 mg by mouth 2 (two) times daily.  02/17/17  Yes Karamalegos, Devonne Doughty, DO  omeprazole (PRILOSEC) 20 MG capsule TAKE 1 CAPSULE BY MOUTH TWICE DAILY 09/06/17  Yes Karamalegos, Devonne Doughty, DO  potassium chloride SA (K-DUR,KLOR-CON) 20 MEQ tablet Take 1 tablet (20 mEq total) by mouth daily. 04/19/17  Yes Karamalegos, Devonne Doughty, DO  PROAIR HFA 108 [phone] Base) MCG/ACT inhaler INHALE  2 PUFFS BY MOUTH INTO THE LUNGS FOUR TIMES DAILY AS NEEDED 09/02/17  Yes Karamalegos, Devonne Doughty, DO  trazodone (DESYREL) 300 MG tablet Take 300 mg by mouth at bedtime. Reported on 10/27/2015   Yes [provider]     Results for orders placed or performed during the hospital encounter of 09/20/17 (from the past 48 hour(s))  CBC     Status: Abnormal   Collection Time: 09/20/17 11:35 PM  Result Value Ref Range   WBC 6.3 3.8 - 10.6 K/uL   RBC 3.73 (L) 4.40 - 5.90 MIL/uL   Hemoglobin 11.1 (L) 13.0 - 18.0 g/dL   HCT 32.5 (L) 40.0 - 52.0 %   MCV 87.1 80.0 - 100.0 fL   MCH 29.8 26.0 - 34.0 pg   MCHC 34.2  32.0 - 36.0 g/dL   RDW 14.4 11.5 - 14.5 %   Platelets 232 150 - 440 K/uL    Comment: Performed at [facility], [address] [place], [place] [address]  Comprehensive metabolic panel     Status: Abnormal   Collection Time: 09/20/17 11:35 PM  Result Value Ref Range   Sodium 127 (L) 135 - 145 mmol/L   Potassium 3.2 (L) 3.5 - 5.1 mmol/L   Chloride 93 (L) 101 - 111 mmol/L   CO2 25 22 - 32 mmol/L   Glucose, Bld 138 (H) 65 - 99 mg/dL   BUN 10 6 - 20 mg/dL   Creatinine, Ser 1.02 0.61 - 1.24 mg/dL   Calcium 8.4 (L) 8.9 - 10.3 mg/dL   Total Protein 6.6 6.5 - 8.1 g/dL   Albumin 3.4 (L) 3.5 - 5.0 g/dL   AST 17 15 - 41 U/L   ALT 9 (L) 17 - 63 U/L   Alkaline Phosphatase 89 38 - 126 U/L   Total Bilirubin 0.5 0.3 - 1.2 mg/dL   GFR calc non Af Amer >60 >60 mL/min   GFR calc Af Amer >60 >60 mL/min    Comment: (NOTE) The eGFR has been calculated using the CKD EPI equation. This calculation has not been validated in all clinical situations. eGFR's persistently <60 mL/min signify possible Chronic Kidney Disease.    Anion gap 9 5 - 15    Comment: Performed at [facility], [place], [place] [address]   Ct Head Wo Contrast  Result Date: 09/21/2017 CLINICAL DATA:  Head and neck trauma after fall. Patient hit posterior head and neck during the fall. EXAM: CT HEAD WITHOUT CONTRAST CT CERVICAL SPINE WITHOUT CONTRAST TECHNIQUE: Multidetector CT imaging of the head and cervical spine was performed following the standard protocol without intravenous contrast. Multiplanar CT image reconstructions of the cervical spine were also generated. COMPARISON:  06/01/2017 FINDINGS: CT HEAD FINDINGS Brain: Mild superficial atrophy. Chronic small vessel ischemic disease and left basal ganglial lacunar infarcts are redemonstrated. No acute intracranial hemorrhage, large vascular territory infarct, edema or shift. No intra-axial mass nor extra-axial fluid collections. Vascular: Mild  atherosclerosis of the carotid siphons. No hyperdense vessels. Skull: No skull fracture or significant scalp soft tissue swelling. Sinuses/Orbits: No acute finding. Other: None. CT CERVICAL SPINE FINDINGS Alignment: Maintained cervical lordosis. Osteoarthritic joint space narrowing and spurring about the atlantodental interval. Minimal anterolisthesis of C4 on C5 likely degenerative in etiology. Skull base and vertebrae: Cervical vertebral bodies and posterior elements appear intact. Moderate C6-7 disc space flattening with endplate spurring compatible with degenerative disc disease. Soft tissues and spinal canal: No prevertebral fluid or swelling. No visible  canal hematoma. Mild calcific atherosclerosis of the carotid bifurcations. Disc levels: No significant osseous canal stenosis. Mild bilateral C6-7 neural foraminal narrowing. Bilateral C3-4, left C5-6 and bilateral C6-7 uncovertebral joint osteoarthritis with uncinate spurs. Upper chest: Negative. Other: None IMPRESSION: 1. No acute intracranial abnormality. Mild chronic small vessel ischemic disease and old left basal ganglial lacunar infarct. 2. No acute cervical spine fracture. Moderate disc flattening C6-7 with associated neural foraminal encroachment Electronically Signed   By: Ashley Royalty M.D.   On: 09/21/2017 00:48   Ct Cervical Spine Wo Contrast  Result Date: 09/21/2017 CLINICAL DATA:  Head and neck trauma after fall. Patient hit posterior head and neck during the fall. EXAM: CT HEAD WITHOUT CONTRAST CT CERVICAL SPINE WITHOUT CONTRAST TECHNIQUE: Multidetector CT imaging of the head and cervical spine was performed following the standard protocol without intravenous contrast. Multiplanar CT image reconstructions of the cervical spine were also generated. COMPARISON:  06/01/2017 FINDINGS: CT HEAD FINDINGS Brain: Mild superficial atrophy. Chronic small vessel ischemic disease and left basal ganglial lacunar infarcts are redemonstrated. No acute  intracranial hemorrhage, large vascular territory infarct, edema or shift. No intra-axial mass nor extra-axial fluid collections. Vascular: Mild atherosclerosis of the carotid siphons. No hyperdense vessels. Skull: No skull fracture or significant scalp soft tissue swelling. Sinuses/Orbits: No acute finding. Other: None. CT CERVICAL SPINE FINDINGS Alignment: Maintained cervical lordosis. Osteoarthritic joint space narrowing and spurring about the atlantodental interval. Minimal anterolisthesis of C4 on C5 likely degenerative in etiology. Skull base and vertebrae: Cervical vertebral bodies and posterior elements appear intact. Moderate C6-7 disc space flattening with endplate spurring compatible with degenerative disc disease. Soft tissues and spinal canal: No prevertebral fluid or swelling. No visible canal hematoma. Mild calcific atherosclerosis of the carotid bifurcations. Disc levels: No significant osseous canal stenosis. Mild bilateral C6-7 neural foraminal narrowing. Bilateral C3-4, left C5-6 and bilateral C6-7 uncovertebral joint osteoarthritis with uncinate spurs. Upper chest: Negative. Other: None IMPRESSION: 1. No acute intracranial abnormality. Mild chronic small vessel ischemic disease and old left basal ganglial lacunar infarct. 2. No acute cervical spine fracture. Moderate disc flattening C6-7 with associated neural foraminal encroachment Electronically Signed   By: Ashley Royalty M.D.   On: 09/21/2017 00:48   Dg Chest Port 1 View  Result Date: 09/21/2017 CLINICAL DATA:  Patient lost balance and fell onto right hip. Preop right hip fracture. Daily smoker with history of COPD. EXAM: PORTABLE CHEST 1 VIEW COMPARISON:  04/06/2017 FINDINGS: Patient is rotated on the current study. Heart size is within normal limits. There is aortic atherosclerosis. Hyperinflated lungs without pneumonic consolidation or pneumothorax. Costophrenic angles are excluded on this study. No acute osseous abnormality of the bony  thorax. IMPRESSION: Clear lungs.  Aortic atherosclerosis. Electronically Signed   By: Ashley Royalty M.D.   On: 09/21/2017 01:02   Dg Hip Unilat W Or Wo Pelvis 2-3 Views Right  Result Date: 09/21/2017 CLINICAL DATA:  Lost balance, fell tonight. History of stroke and RIGHT hemiplegia. EXAM: DG HIP (WITH OR WITHOUT PELVIS) 2-3V RIGHT COMPARISON:  None. FINDINGS: Acute RIGHT femur intertrochanteric fracture with internal rotation. Laterally directed fracture apex. No dislocation. No destructive bony lesions. Osteopenia. Mild vascular calcifications. IMPRESSION: Acute displaced RIGHT femur intertrochanteric fracture. Electronically Signed   By: Elon Alas M.D.   On: 09/21/2017 01:00    Review of Systems  Constitutional: Negative for chills and fever.  HENT: Negative for sore throat and tinnitus.   Eyes: Negative for blurred vision and redness.  Respiratory: Negative for cough  and shortness of breath.   Cardiovascular: Negative for chest pain, palpitations, orthopnea and PND.  Gastrointestinal: Negative for abdominal pain, diarrhea, nausea and vomiting.  Genitourinary: Negative for dysuria, frequency and urgency.  Musculoskeletal: Positive for joint pain. Negative for myalgias.  Skin: Negative for rash.       No lesions  Neurological: Negative for speech change, focal weakness and weakness.  Endo/Heme/Allergies: Does not bruise/bleed easily.       No temperature intolerance  Psychiatric/Behavioral: Negative for depression and suicidal ideas.    Blood pressure (!) 104/57, pulse 61, temperature 98 F (36.7 C), temperature source Oral, resp. rate 18, weight 68 kg (150 lb), SpO2 100 %. Physical Exam  Vitals reviewed. Constitutional: He is oriented to person, place, and time. He appears well-developed and well-nourished. No distress.  HENT:  Head: Normocephalic and atraumatic.  Mouth/Throat: Oropharynx is clear and moist.  Eyes: Conjunctivae and EOM are normal. Pupils are equal, round, and  reactive to light. No scleral icterus.  Neck: Normal range of motion. Neck supple. No JVD present. No tracheal deviation present. No thyromegaly present.  Cardiovascular: Normal rate, regular rhythm and normal heart sounds. Exam reveals no gallop and no friction rub.  No murmur heard. Respiratory: Effort normal and breath sounds normal. No respiratory distress.  GI: Soft. Bowel sounds are normal. He exhibits no distension. There is no tenderness.  Genitourinary:  Genitourinary Comments: Deferred  Musculoskeletal: He exhibits tenderness and deformity. He exhibits no edema.  Right leg range of motion limited by pain  Lymphadenopathy:    He has no cervical adenopathy.  Neurological: He is alert and oriented to person, place, and time. No cranial nerve deficit.  Skin: Skin is warm and dry. No rash noted. No erythema.  Psychiatric: His speech is normal and behavior is normal. His affect is blunt. Cognition and memory are normal.  Difficult to assess thought judgment patient is reticent to operate on answers possibly due to underlying psychiatric illness as well as the fact that he is somnolent due to analgesia     Assessment/Plan This is a 62 year old male admitted for hip fracture. 1.  Hip fracture: Right femur intertrochanteric fracture.  Manage pain.  Traction and preoperative instructions per orthopedic service. 2.  Hypertension: Controlled; continue metoprolol.  I have held amlodipine, the patient's ARB and diuretic prior to surgery.  May resume postoperatively if necessary. 3.  COPD: Stable; continue inhaled corticosteroid.  Albuterol as needed. 4.  Bipolar disorder: Continue Rexulti and Cymbalta 5.  History of stroke: I held the patient's aspirin prior to surgery.  Resume postoperatively. 6.  DVT prophylaxis: SCDs for now.  Start Lovenox postoperatively 7.  GI prophylaxis: PPI per home regimen The patient is a full code.  Time spent on admission orders and patient care approximately 45  minutes  Harrie Foreman, MD 09/21/2017, 3:42 AM

## 2017-09-21 NOTE — Consult Note (Signed)
ORTHOPAEDIC CONSULTATION  REQUESTING PHYSICIAN: Max Sane, MD  Chief Complaint:   Right hip pain.  History of Present Illness: David Hall. is a 62 y.o. male with multiple medical problems including hypertension, a stroke resulting in some right-sided hemiplegia, COPD, and bipolar disorder apparently lost his balance and fell onto his right side yesterday evening.  He is unable to get up.  He was brought to the emergency room where x-rays demonstrated an intertrochanteric fracture of his right hip.  The patient has been admitted in preparation for definitive management of his injury.  The patient denies any associated injuries.  He did not strike his head or lose consciousness.  He denies any lightheadedness, dizziness, chest pain, shortness of breath, or other symptoms which may have precipitated his fall.  Past Medical History:  Diagnosis Date  . Bipolar disorder ([place])   . COPD (chronic obstructive pulmonary disease) ([place])    on home oxygen.   [name] GERD (gastroesophageal reflux disease)   . Hyperlipidemia   . Hypertension   . Insomnia, controlled   . Obesity   . Oxygen deficiency    2 LNC at night  . Shortness of breath   . Stroke [facility]) 2014   R sided hemiplegia  . Substance abuse ([place]) 2016   cocaine use.   Past Surgical History:  Procedure Laterality Date  . CARPAL TUNNEL RELEASE    . TONSILLECTOMY     Social History   Socioeconomic History  . Marital status: Single    Spouse name: None  . Number of children: None  . Years of education: None  . Highest education level: None  Social Needs  . Financial resource strain: None  . Food insecurity - worry: None  . Food insecurity - inability: None  . Transportation needs - medical: None  . Transportation needs - non-medical: None  Occupational History  . None  Tobacco Use  . Smoking status: Current Every Day Smoker    Packs/day: 0.50    Years:  45.00    Pack years: 22.50    Types: Cigarettes  . Smokeless tobacco: Current User  . Tobacco comment: smoking 10-15 cigs daily   Substance and Sexual Activity  . Alcohol use: No    Alcohol/week: 0.0 oz  . Drug use: No  . Sexual activity: None  Other Topics Concern  . None  Social History Narrative  . None   Family History  Problem Relation Age of Onset  . CAD Mother   . Hypertension Mother   . Stroke Mother   . Lung disease Father    Allergies  Allergen Reactions  . Quetiapine Other (See Comments)    "feels like someone is sticking pins in my legs." "makes me feel like someone is sticking needles and knives in my legs"  . Seroquel [Quetiapine Fumarate] Other (See Comments)    "feels like someone is sticking pins in my legs."  . Codeine Itching and Rash   Prior to Admission medications   Medication Sig Start Date End Date Taking? Authorizing Provider  Amlodipine-Valsartan-HCTZ 10-160-12.5 MG TABS Take 1 tablet by mouth daily. 09/06/17  Yes [provider]  aspirin 325 MG EC tablet Take 325 mg by mouth daily.    Yes [provider]  atorvastatin (LIPITOR) 40 MG tablet TAKE 1 TABLET BY MOUTH DAILY. 09/06/17  Yes Karamalegos, Devonne Doughty, DO  baclofen (LIORESAL) 10 MG tablet TAKE 1/2-1 TABLET BY MOUTH THREE TIMES DAILY AS NEEDED FOR MUSCLE SPASMS 05/02/17  Yes Karamalegos, Devonne Doughty, DO  Brexpiprazole (REXULTI) 2 MG TABS Take 2 mg by mouth at bedtime.   Yes [provider]  DULoxetine (CYMBALTA) 30 MG capsule Take 90 mg by mouth daily.   Yes [provider]  gabapentin (NEURONTIN) 600 MG tablet TAKE 1 TABLET BY MOUTH TWICE DAILY, IF NEEDED MAY TAKE AN ADDITIONAL DOSE IN THE AFTERNOON. 09/07/17  Yes Karamalegos, Devonne Doughty, DO  isosorbide mononitrate (IMDUR) 30 MG 24 hr tablet TAKE 1 TABLET BY MOUTH DAILY 09/06/17  Yes Karamalegos, Devonne Doughty, DO  metoprolol tartrate (LOPRESSOR) 50 MG tablet Take 0.5 tablets (25 mg total) by mouth 2 (two) times  daily. Patient taking differently: Take 50 mg by mouth 2 (two) times daily.  02/17/17  Yes Karamalegos, Devonne Doughty, DO  omeprazole (PRILOSEC) 20 MG capsule TAKE 1 CAPSULE BY MOUTH TWICE DAILY 09/06/17  Yes Karamalegos, Devonne Doughty, DO  potassium chloride SA (K-DUR,KLOR-CON) 20 MEQ tablet Take 1 tablet (20 mEq total) by mouth daily. 04/19/17  Yes Karamalegos, Devonne Doughty, DO  PROAIR HFA 108 [phone] Base) MCG/ACT inhaler INHALE 2 PUFFS BY MOUTH INTO THE LUNGS FOUR TIMES DAILY AS NEEDED 09/02/17  Yes Karamalegos, Devonne Doughty, DO  trazodone (DESYREL) 300 MG tablet Take 300 mg by mouth at bedtime. Reported on 10/27/2015   Yes [provider]   Ct Head Wo Contrast  Result Date: 09/21/2017 CLINICAL DATA:  Head and neck trauma after fall. Patient hit posterior head and neck during the fall. EXAM: CT HEAD WITHOUT CONTRAST CT CERVICAL SPINE WITHOUT CONTRAST TECHNIQUE: Multidetector CT imaging of the head and cervical spine was performed following the standard protocol without intravenous contrast. Multiplanar CT image reconstructions of the cervical spine were also generated. COMPARISON:  06/01/2017 FINDINGS: CT HEAD FINDINGS Brain: Mild superficial atrophy. Chronic small vessel ischemic disease and left basal ganglial lacunar infarcts are redemonstrated. No acute intracranial hemorrhage, large vascular territory infarct, edema or shift. No intra-axial mass nor extra-axial fluid collections. Vascular: Mild atherosclerosis of the carotid siphons. No hyperdense vessels. Skull: No skull fracture or significant scalp soft tissue swelling. Sinuses/Orbits: No acute finding. Other: None. CT CERVICAL SPINE FINDINGS Alignment: Maintained cervical lordosis. Osteoarthritic joint space narrowing and spurring about the atlantodental interval. Minimal anterolisthesis of C4 on C5 likely degenerative in etiology. Skull base and vertebrae: Cervical vertebral bodies and posterior elements appear intact. Moderate C6-7 disc space  flattening with endplate spurring compatible with degenerative disc disease. Soft tissues and spinal canal: No prevertebral fluid or swelling. No visible canal hematoma. Mild calcific atherosclerosis of the carotid bifurcations. Disc levels: No significant osseous canal stenosis. Mild bilateral C6-7 neural foraminal narrowing. Bilateral C3-4, left C5-6 and bilateral C6-7 uncovertebral joint osteoarthritis with uncinate spurs. Upper chest: Negative. Other: None IMPRESSION: 1. No acute intracranial abnormality. Mild chronic small vessel ischemic disease and old left basal ganglial lacunar infarct. 2. No acute cervical spine fracture. Moderate disc flattening C6-7 with associated neural foraminal encroachment Electronically Signed   By: Ashley Royalty M.D.   On: 09/21/2017 00:48   Ct Cervical Spine Wo Contrast  Result Date: 09/21/2017 CLINICAL DATA:  Head and neck trauma after fall. Patient hit posterior head and neck during the fall. EXAM: CT HEAD WITHOUT CONTRAST CT CERVICAL SPINE WITHOUT CONTRAST TECHNIQUE: Multidetector CT imaging of the head and cervical spine was performed following the standard protocol without intravenous contrast. Multiplanar CT image reconstructions of the cervical spine were also generated. COMPARISON:  06/01/2017 FINDINGS: CT HEAD FINDINGS Brain: Mild superficial atrophy. Chronic small  vessel ischemic disease and left basal ganglial lacunar infarcts are redemonstrated. No acute intracranial hemorrhage, large vascular territory infarct, edema or shift. No intra-axial mass nor extra-axial fluid collections. Vascular: Mild atherosclerosis of the carotid siphons. No hyperdense vessels. Skull: No skull fracture or significant scalp soft tissue swelling. Sinuses/Orbits: No acute finding. Other: None. CT CERVICAL SPINE FINDINGS Alignment: Maintained cervical lordosis. Osteoarthritic joint space narrowing and spurring about the atlantodental interval. Minimal anterolisthesis of C4 on C5 likely  degenerative in etiology. Skull base and vertebrae: Cervical vertebral bodies and posterior elements appear intact. Moderate C6-7 disc space flattening with endplate spurring compatible with degenerative disc disease. Soft tissues and spinal canal: No prevertebral fluid or swelling. No visible canal hematoma. Mild calcific atherosclerosis of the carotid bifurcations. Disc levels: No significant osseous canal stenosis. Mild bilateral C6-7 neural foraminal narrowing. Bilateral C3-4, left C5-6 and bilateral C6-7 uncovertebral joint osteoarthritis with uncinate spurs. Upper chest: Negative. Other: None IMPRESSION: 1. No acute intracranial abnormality. Mild chronic small vessel ischemic disease and old left basal ganglial lacunar infarct. 2. No acute cervical spine fracture. Moderate disc flattening C6-7 with associated neural foraminal encroachment Electronically Signed   By: Ashley Royalty M.D.   On: 09/21/2017 00:48   Dg Chest Port 1 View  Result Date: 09/21/2017 CLINICAL DATA:  Patient lost balance and fell onto right hip. Preop right hip fracture. Daily smoker with history of COPD. EXAM: PORTABLE CHEST 1 VIEW COMPARISON:  04/06/2017 FINDINGS: Patient is rotated on the current study. Heart size is within normal limits. There is aortic atherosclerosis. Hyperinflated lungs without pneumonic consolidation or pneumothorax. Costophrenic angles are excluded on this study. No acute osseous abnormality of the bony thorax. IMPRESSION: Clear lungs.  Aortic atherosclerosis. Electronically Signed   By: Ashley Royalty M.D.   On: 09/21/2017 01:02   Dg Hip Unilat W Or Wo Pelvis 2-3 Views Right  Result Date: 09/21/2017 CLINICAL DATA:  Lost balance, fell tonight. History of stroke and RIGHT hemiplegia. EXAM: DG HIP (WITH OR WITHOUT PELVIS) 2-3V RIGHT COMPARISON:  None. FINDINGS: Acute RIGHT femur intertrochanteric fracture with internal rotation. Laterally directed fracture apex. No dislocation. No destructive bony lesions.  Osteopenia. Mild vascular calcifications. IMPRESSION: Acute displaced RIGHT femur intertrochanteric fracture. Electronically Signed   By: Elon Alas M.D.   On: 09/21/2017 01:00    Positive ROS: All other systems have been reviewed and were otherwise negative with the exception of those mentioned in the HPI and as above.  Physical Exam: General:  Alert, no acute distress Psychiatric:  Patient is competent for consent with normal mood and affect   Cardiovascular:  No pedal edema Respiratory:  No wheezing, non-labored breathing GI:  Abdomen is soft and non-tender Skin:  No lesions in the area of chief complaint Neurologic:  Sensation intact distally Lymphatic:  No axillary or cervical lymphadenopathy  Orthopedic Exam:  Orthopedic examination is limited to the right hip and lower extremity.  The patient's right lower extremity is somewhat shortened and externally rotated in relation to the left lower extremity.  Skin inspection over the right hip noted is notable for a contusion laterally.  No other skin abnormalities are identified.  She he has tenderness to palpation over the lateral aspect of the hip.  He has more severe pain with any attempted active or passive motion of the hip.  He is neurovascularly intact to the right lower extremity and foot.  X-rays:  X-rays of the pelvis and right hip are available for review.  These films demonstrate  a displaced/impacted 2 part intertrochanteric right hip fracture.  The hip joint itself appears to be well maintained.  No lytic lesions or significant degenerative changes are identified.  Assessment: Displaced 2 part intertrochanteric fracture right hip.  Plan: The treatment options have been discussed with the patient, including both surgical and nonsurgical choices.  The patient went to proceed with surgical intervention to include an intramedullary nailing of the right hip fracture.  This procedure has been discussed in detail with the  patient, as have the potential risks (including bleeding, infection, nerve and/or blood vessel injury, persistent recurrent pain, stiffness, malunion and/or nonunion, leg length inequality, need for further surgery, blood clots, strokes, heart attacks and/or arrhythmias, etc.) and benefits.  The patient states his understanding and wishes to proceed.  A formal written consent will be obtained by the nursing staff.  Thank you for asked me to participate in the care of this gentleman.  I will be happy to follow him with you.   Pascal Lux, MD  Beeper #:  [phone]  09/21/2017 11:49 AM

## 2017-09-21 NOTE — Anesthesia Preprocedure Evaluation (Addendum)
Anesthesia Evaluation  Patient identified by MRN, date of birth, ID band Patient awake    Reviewed: Allergy & Precautions, NPO status , Patient's Chart, lab work & pertinent test results, reviewed documented beta blocker date and time   Airway Mallampati: III  TM Distance: >3 FB     Dental  (+) Upper Dentures, Lower Dentures   Pulmonary shortness of breath and with exertion, COPD,  COPD inhaler and oxygen dependent, Current Smoker,    + rhonchi        Cardiovascular hypertension, Pt. on medications and Pt. on home beta blockers Normal cardiovascular exam     Neuro/Psych PSYCHIATRIC DISORDERS Bipolar Disorder CVA, Residual Symptoms    GI/Hepatic Neg liver ROS, GERD  Medicated,(+)     substance abuse  alcohol use and cocaine use,   Endo/Other  negative endocrine ROS  Renal/GU Renal InsufficiencyRenal disease     Musculoskeletal  (+) Arthritis , Osteoarthritis,    Abdominal Normal abdominal exam  (+)   Peds negative pediatric ROS (+)  Hematology  (+) anemia ,   Anesthesia Other Findings   Reproductive/Obstetrics                            Anesthesia Physical Anesthesia Plan  ASA: III  Anesthesia Plan: Spinal   Post-op Pain Management:    Induction: Intravenous  PONV Risk Score and Plan:   Airway Management Planned: Nasal Cannula  Additional Equipment:   Intra-op Plan:   Post-operative Plan:   Informed Consent: I have reviewed the patients History and Physical, chart, labs and discussed the procedure including the risks, benefits and alternatives for the proposed anesthesia with the patient or authorized representative who has indicated his/her understanding and acceptance.   Dental advisory given  Plan Discussed with: CRNA and Surgeon  Anesthesia Plan Comments: (Discussed with patient the risks and benefits of both regional and GOT.  With the patient's significant COPD and  O2 dependency, we felt that regional block would be more prudent for intraop and post op care despite patient's residual hemiparesis.  Patient understands and agrees.)      Anesthesia Quick Evaluation

## 2017-09-21 NOTE — Plan of Care (Signed)
In/Out cath completed, but no urine was removed.  Patient states having no pressure or desire to urinate.  Dr. Ree Kida to possibly increase fluids.

## 2017-09-21 NOTE — NC FL2 (Signed)
[place] LEVEL OF CARE SCREENING TOOL     IDENTIFICATION  Patient Name: David Hall. Birthdate: 10/13/1955 Sex: male Admission Date (Current Location): 09/20/2017  [facility] and [place] Number:  [name] ([id number] [phone]) Facility and Address:  [facility], [address] [place], [place] [address]      Provider Number: [id number]  Attending Physician Name and Address:  Max Sane, MD  Relative Name and Phone Number:       Current Level of Care: Hospital Recommended Level of Care: [place] Prior Approval Number:    Date Approved/Denied:   PASRR Number:    Discharge Plan: SNF    Current Diagnoses: Patient Active Problem List   Diagnosis Date Noted  . Hip fracture ([place]) 09/21/2017  . Hypokalemia 04/19/2017  . GERD (gastroesophageal reflux disease) 12/10/2015  . 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 Right 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) ([place]) 10/27/2015  . Abnormal MRI of head 10/27/2015  . Memory loss 07/25/2015  . Faintness 07/25/2015  . On home oxygen therapy 04/02/2015  . Bipolar 1 disorder, depressed ([place]) 01/27/2015  . Pulmonary nodule 01/27/2015  . Blood glucose elevated 01/27/2015  . Restless leg 01/27/2015  . Compulsive tobacco user syndrome 01/27/2015  . Arthritis of knee, degenerative 01/27/2015  . Benign hypertension with CKD (chronic kidney disease), stage II 01/27/2015  . Cocaine abuse ([place]) 01/27/2015  . COPD (chronic obstructive pulmonary disease) with emphysema ([place]) 01/27/2015  . Chronic respiratory failure ([place]) 11/12/2014  . Recurrent falls 05/29/2014  . Cervical pain 05/29/2014  . Bursitis of elbow 05/29/2014  . Elbow pain 05/29/2014  . Dislocated shoulder 05/21/2014  . Lung mass 05/21/2014  . Dyslipidemia 10/11/2013  . History of CVA with residual deficit 09/28/2013    Orientation RESPIRATION BLADDER Height & Weight      Self, Time, Situation, Place  Normal Continent Weight: 150 lb (68 kg) Height:     BEHAVIORAL SYMPTOMS/MOOD NEUROLOGICAL BOWEL NUTRITION STATUS      Continent Diet(Regular Diet. )  AMBULATORY STATUS COMMUNICATION OF NEEDS Skin   Extensive Assist Verbally Surgical wounds                       Personal Care Assistance Level of Assistance  Bathing, Feeding, Dressing Bathing Assistance: Limited assistance Feeding assistance: Independent Dressing Assistance: Limited assistance     Functional Limitations Info  Sight, Hearing, Speech Sight Info: Adequate Hearing Info: Adequate Speech Info: Adequate    SPECIAL CARE FACTORS FREQUENCY  PT (By licensed PT), OT (By licensed OT)     PT Frequency: (5) OT Frequency: (5)            Contractures      Additional Factors Info  Code Status, Allergies Code Status Info: (Full Code. ) Allergies Info: (Quetiapine, Seroquel Quetiapine Fumarate, Codeine)           Current Medications (09/21/2017):  This is the current hospital active medication list Current Facility-Administered Medications  Medication Dose Route Frequency Provider Last Rate Last Dose  . 0.9 %  sodium chloride infusion   Intravenous Continuous Harrie Foreman, MD 125 mL/hr at 09/21/17 [phone]    . acetaminophen (TYLENOL) tablet 650 mg  650 mg Oral Q6H PRN Harrie Foreman, MD       Or  . acetaminophen (TYLENOL) suppository 650 mg  650 mg Rectal Q6H PRN Harrie Foreman, MD      . albuterol (PROVENTIL) (2.5  MG/3ML) 0.083% nebulizer solution 2.5 mg  2.5 mg Nebulization Q6H PRN Harrie Foreman, MD      . atorvastatin (LIPITOR) tablet 40 mg  40 mg Oral Daily Harrie Foreman, MD      . baclofen (LIORESAL) tablet 10 mg  10 mg Oral TID PRN Harrie Foreman, MD      . Brexpiprazole TABS 2 mg  2 mg Oral QHS Harrie Foreman, MD      . ceFAZolin (ANCEF) IVPB 2g/100 mL premix  2 g Intravenous 30 min Pre-Op Poggi, Marshall Cork, MD      . Chlorhexidine Gluconate  Cloth 2 % PADS 6 each  6 each Topical Daily Manuella Ghazi, Vipul, MD      . diphenhydrAMINE (BENADRYL) injection 12.5 mg  12.5 mg Intravenous Q6H PRN Harrie Foreman, MD      . divalproex (DEPAKOTE) DR tablet 500 mg  500 mg Oral BID Harrie Foreman, MD      . docusate sodium (COLACE) capsule 100 mg  100 mg Oral BID Harrie Foreman, MD      . DULoxetine (CYMBALTA) DR capsule 90 mg  90 mg Oral Daily Harrie Foreman, MD      . isosorbide mononitrate (IMDUR) 24 hr tablet 30 mg  30 mg Oral Daily Harrie Foreman, MD      . metoprolol tartrate (LOPRESSOR) tablet 25 mg  25 mg Oral BID Harrie Foreman, MD      . mometasone-formoterol [facility]) 200-5 MCG/ACT inhaler 2 puff  2 puff Inhalation BID Harrie Foreman, MD      . morphine 4 MG/ML injection 4 mg  4 mg Intravenous Q4H PRN Harrie Foreman, MD   4 mg at 09/21/17 0504  . mupirocin ointment (BACTROBAN) 2 % 1 application  1 application Nasal BID Max Sane, MD      . nitroGLYCERIN (NITROSTAT) SL tablet 0.4 mg  0.4 mg Sublingual Q5 min PRN Harrie Foreman, MD      . ondansetron [facility]) tablet 4 mg  4 mg Oral Q6H PRN Harrie Foreman, MD       Or  . ondansetron [facility]) injection 4 mg  4 mg Intravenous Q6H PRN Harrie Foreman, MD      . oxyCODONE-acetaminophen (PERCOCET/ROXICET) 5-325 MG per tablet 1 tablet  1 tablet Oral Q6H PRN Harrie Foreman, MD      . pantoprazole (PROTONIX) EC tablet 40 mg  40 mg Oral BID Harrie Foreman, MD      . potassium chloride SA (K-DUR,KLOR-CON) CR tablet 20 mEq  20 mEq Oral Daily Harrie Foreman, MD         Discharge Medications: Please see discharge summary for a list of discharge medications.  Relevant Imaging Results:  Relevant Lab Results:   Additional Information (SSN: [id number])  Avea Mcgowen, Veronia Beets, LCSW

## 2017-09-21 NOTE — ED Provider Notes (Signed)
[facility] Emergency Department Provider Note   ____________________________________________   First MD Initiated Contact with Patient 09/20/17 2325     (approximate)  I have reviewed the triage vital signs and the nursing notes.   HISTORY  Chief Complaint Fall    HPI David Hall. is a 62 y.o. male who comes into the hospital today with some right hip pain.  The patient states that he thinks he broke his hip.  He fell on his right side.  The patient states that he just lost his balance and fell on his right side.  The patient states that he did hit his head when he fell.  He has pain to his right hip.  He rates his pain a 15 out of 10 in intensity.  Patient states that the pain goes all the way down to his foot.  He has a history of COPD and wears 4 L of oxygen at home all of the time.  The patient denies any chest pain shortness of breath or any other pain.  He is here today for evaluation.   Past Medical History:  Diagnosis Date  . Bipolar disorder ([place])   . COPD (chronic obstructive pulmonary disease) ([place])    on home oxygen.   [name] GERD (gastroesophageal reflux disease)   . Hyperlipidemia   . Hypertension   . Insomnia, controlled   . Obesity   . Oxygen deficiency    2 LNC at night  . Shortness of breath   . Stroke [facility]) 2014   R sided hemiplegia  . Substance abuse ([place]) 2016   cocaine use.    Patient Active Problem List   Diagnosis Date Noted  . Hypokalemia 04/19/2017  . GERD (gastroesophageal reflux disease) 12/10/2015  . 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 Right 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) ([place]) 10/27/2015  . Abnormal MRI of head 10/27/2015  . Memory loss 07/25/2015  . Faintness 07/25/2015  . On home oxygen therapy 04/02/2015  . Bipolar 1 disorder, depressed ([place]) 01/27/2015  . Pulmonary nodule 01/27/2015  . Blood glucose elevated 01/27/2015  . Restless leg  01/27/2015  . Compulsive tobacco user syndrome 01/27/2015  . Arthritis of knee, degenerative 01/27/2015  . Benign hypertension with CKD (chronic kidney disease), stage II 01/27/2015  . Cocaine abuse ([place]) 01/27/2015  . COPD (chronic obstructive pulmonary disease) with emphysema ([place]) 01/27/2015  . Chronic respiratory failure ([place]) 11/12/2014  . Recurrent falls 05/29/2014  . Cervical pain 05/29/2014  . Bursitis of elbow 05/29/2014  . Elbow pain 05/29/2014  . Dislocated shoulder 05/21/2014  . Lung mass 05/21/2014  . Dyslipidemia 10/11/2013  . History of CVA with residual deficit 09/28/2013    Past Surgical History:  Procedure Laterality Date  . CARPAL TUNNEL RELEASE    . TONSILLECTOMY      Prior to Admission medications   Medication Sig Start Date End Date Taking? Authorizing Provider  albuterol (PROVENTIL) (2.5 MG/3ML) 0.083% nebulizer solution USE 1 VIAL VIA NEBULIZER EVERY 6 HOURS AS NEEDED FOR WHEEZING OR SHORTNESS OF BREATH 09/26/15   Krebs, Genevie Cheshire, NP  amLODipine (NORVASC) 10 MG tablet Take 1 tablet (10 mg total) by mouth daily. 04/19/17   Karamalegos, Devonne Doughty, DO  Amlodipine-Valsartan-HCTZ 10-160-12.5 MG TABS Take 1 tablet by mouth daily. 09/06/17   [provider]  aspirin 325 MG EC tablet Take 325 mg by mouth daily.    [provider]  atorvastatin (LIPITOR) 40 MG  tablet TAKE 1 TABLET BY MOUTH DAILY. 09/06/17   Karamalegos, Devonne Doughty, DO  baclofen (LIORESAL) 10 MG tablet TAKE 1/2-1 TABLET BY MOUTH THREE TIMES DAILY AS NEEDED FOR MUSCLE SPASMS 05/02/17   Karamalegos, Devonne Doughty, DO  Brexpiprazole (REXULTI) 2 MG TABS Take 2 mg by mouth at bedtime.    [provider]  chlorhexidine (HIBICLENS) 4 % external liquid Apply 1 application topically 2 (two) times daily.    [provider]  divalproex (DEPAKOTE) 250 MG DR tablet TAKE 2 TABLETS BY MOUTH TWICE DAILY 04/07/16   [provider]  DULoxetine (CYMBALTA) 30 MG capsule TK 3 CS PO  QAM 06/20/15   [provider]  Fluticasone-Salmeterol (ADVAIR DISKUS) 250-50 MCG/DOSE AEPB USE 1 INHALATION BY MOUTH TWICE DAILY 08/05/16   Karamalegos, Devonne Doughty, DO  gabapentin (NEURONTIN) 600 MG tablet TAKE 1 TABLET BY MOUTH TWICE DAILY, IF NEEDED MAY TAKE AN ADDITIONAL DOSE IN THE AFTERNOON. 09/07/17   Karamalegos, Devonne Doughty, DO  hydrALAZINE (APRESOLINE) 25 MG tablet Take 1 tablet (25 mg total) by mouth 3 (three) times daily. 12/10/16   Karamalegos, Devonne Doughty, DO  ibuprofen (ADVIL,MOTRIN) 400 MG tablet TAKE 1 TABLET(400 MG) BY MOUTH TWICE DAILY 09/30/15   Krebs, Genevie Cheshire, NP  isosorbide mononitrate (IMDUR) 30 MG 24 hr tablet TAKE 1 TABLET BY MOUTH DAILY 09/06/17   Parks Ranger, Devonne Doughty, DO  metoprolol tartrate (LOPRESSOR) 50 MG tablet Take 0.5 tablets (25 mg total) by mouth 2 (two) times daily. 02/17/17   Karamalegos, Devonne Doughty, DO  nitroGLYCERIN (NITROSTAT) 0.3 MG SL tablet Place 1 tablet (0.3 mg total) under the tongue every 5 (five) minutes as needed for chest pain. 10/11/13   Angiulli, Lavon Paganini, PA-C  omeprazole (PRILOSEC) 20 MG capsule TAKE 1 CAPSULE BY MOUTH TWICE DAILY 09/06/17   Parks Ranger, Devonne Doughty, DO  oxyCODONE-acetaminophen (ROXICET) 5-325 MG tablet Take 1 tablet by mouth every 6 (six) hours as needed. 06/01/17   Loney Hering, MD  OXYGEN 2 L by Does not apply route at bedtime.    [provider]  potassium chloride (K-DUR) 10 MEQ tablet TAKE 1 TABLET BY MOUTH DAILY 07/14/17   Parks Ranger, Devonne Doughty, DO  potassium chloride SA (K-DUR,KLOR-CON) 20 MEQ tablet Take 1 tablet (20 mEq total) by mouth daily. 04/19/17   Karamalegos, Devonne Doughty, DO  PROAIR HFA 108 [phone] Base) MCG/ACT inhaler INHALE 2 PUFFS BY MOUTH INTO THE LUNGS FOUR TIMES DAILY AS NEEDED 09/02/17   Parks Ranger, Devonne Doughty, DO  traZODone (DESYREL) 50 MG tablet Take by mouth. Reported on 10/27/2015    [provider]  trimethoprim-polymyxin b (POLYTRIM) ophthalmic solution Place 1 drop into  the right eye every 4 (four) hours. For 7 days 04/19/17   Olin Hauser, DO    Allergies Quetiapine; Seroquel [quetiapine fumarate]; and Codeine  Family History  Problem Relation Age of Onset  . CAD Mother   . Hypertension Mother   . Stroke Mother   . Lung disease Father     Social History Social History   Tobacco Use  . Smoking status: Current Every Day Smoker    Packs/day: 0.50    Years: 45.00    Pack years: 22.50    Types: Cigarettes  . Smokeless tobacco: Current User  . Tobacco comment: smoking 10-15 cigs daily   Substance Use Topics  . Alcohol use: No    Alcohol/week: 0.0 oz  . Drug use: No    Review of Systems  Constitutional: No fever/chills Eyes:  No visual changes. ENT: No sore throat. Cardiovascular: Denies chest pain. Respiratory: Denies shortness of breath. Gastrointestinal: No abdominal pain.  No nausea, no vomiting.  No diarrhea.  No constipation. Genitourinary: Negative for dysuria. Musculoskeletal: Right hip pain Skin: Negative for rash. Neurological: Negative for headaches, focal weakness or numbness.   ____________________________________________   PHYSICAL EXAM:  VITAL SIGNS: ED Triage Vitals  Enc Vitals Group     BP 09/20/17 2330 91/67     Pulse Rate 09/20/17 2330 (!) 55     Resp 09/20/17 2330 (!) 23     Temp 09/20/17 2332 98 F (36.7 C)     Temp Source 09/20/17 2332 Oral     SpO2 09/20/17 2330 96 %     Weight 09/20/17 2333 150 lb (68 kg)     Height --      Head Circumference --      Peak Flow --      Pain Score 09/20/17 2332 6     Pain Loc --      Pain Edu? --      Excl. in [place]? --     Constitutional: Alert and oriented. Well appearing and in moderate distress. Eyes: Conjunctivae are normal. PERRL. EOMI. Head: Atraumatic. Nose: No congestion/rhinnorhea. Mouth/Throat: Mucous membranes are moist.  Oropharynx non-erythematous. Cardiovascular: Normal rate, regular rhythm. Grossly normal heart sounds.  Good peripheral  circulation. Respiratory: Normal respiratory effort.  No retractions. Lungs CTAB. Gastrointestinal: Soft and nontender. No distention. Positive bowel sounds Musculoskeletal: tenderness to palpation of right hip   Neurologic:  Normal speech and language. Skin:  Skin is warm, dry and intact. Bruise to right shoulder Psychiatric: Mood and affect are normal.   ____________________________________________   LABS (all labs ordered are listed, but only abnormal results are displayed)  Labs Reviewed  CBC - Abnormal; Notable for the following components:      Result Value   RBC 3.73 (*)    Hemoglobin 11.1 (*)    HCT 32.5 (*)    All other components within normal limits  COMPREHENSIVE METABOLIC PANEL - Abnormal; Notable for the following components:   Sodium 127 (*)    Potassium 3.2 (*)    Chloride 93 (*)    Glucose, Bld 138 (*)    Calcium 8.4 (*)    Albumin 3.4 (*)    ALT 9 (*)    All other components within normal limits   ____________________________________________  EKG  ED ECG REPORT I, Loney Hering, the attending physician, personally viewed and interpreted this ECG.   Date: 09/20/2017  EKG Time: 2329  Rate: 56  Rhythm: normal sinus rhythm  Axis: normal  Intervals:none  ST&T Change: none  ____________________________________________  RADIOLOGY  ED MD interpretation:  Right hip xray: right hip fracture                                      CXR: No pneumonia                                      CT head and cervical spine: no acute intracranial hemorrhage, no  cervical spine fracture  Official radiology report(s): Ct Head Wo Contrast  Result Date: 09/21/2017 CLINICAL DATA:  Head and neck trauma after fall. Patient hit posterior head and neck during the fall. EXAM: CT HEAD WITHOUT CONTRAST CT CERVICAL SPINE WITHOUT CONTRAST TECHNIQUE: Multidetector CT imaging of the head and cervical spine was performed following the standard  protocol without intravenous contrast. Multiplanar CT image reconstructions of the cervical spine were also generated. COMPARISON:  06/01/2017 FINDINGS: CT HEAD FINDINGS Brain: Mild superficial atrophy. Chronic small vessel ischemic disease and left basal ganglial lacunar infarcts are redemonstrated. No acute intracranial hemorrhage, large vascular territory infarct, edema or shift. No intra-axial mass nor extra-axial fluid collections. Vascular: Mild atherosclerosis of the carotid siphons. No hyperdense vessels. Skull: No skull fracture or significant scalp soft tissue swelling. Sinuses/Orbits: No acute finding. Other: None. CT CERVICAL SPINE FINDINGS Alignment: Maintained cervical lordosis. Osteoarthritic joint space narrowing and spurring about the atlantodental interval. Minimal anterolisthesis of C4 on C5 likely degenerative in etiology. Skull base and vertebrae: Cervical vertebral bodies and posterior elements appear intact. Moderate C6-7 disc space flattening with endplate spurring compatible with degenerative disc disease. Soft tissues and spinal canal: No prevertebral fluid or swelling. No visible canal hematoma. Mild calcific atherosclerosis of the carotid bifurcations. Disc levels: No significant osseous canal stenosis. Mild bilateral C6-7 neural foraminal narrowing. Bilateral C3-4, left C5-6 and bilateral C6-7 uncovertebral joint osteoarthritis with uncinate spurs. Upper chest: Negative. Other: None IMPRESSION: 1. No acute intracranial abnormality. Mild chronic small vessel ischemic disease and old left basal ganglial lacunar infarct. 2. No acute cervical spine fracture. Moderate disc flattening C6-7 with associated neural foraminal encroachment Electronically Signed   By: Ashley Royalty M.D.   On: 09/21/2017 00:48   Ct Cervical Spine Wo Contrast  Result Date: 09/21/2017 CLINICAL DATA:  Head and neck trauma after fall. Patient hit posterior head and neck during the fall. EXAM: CT HEAD WITHOUT CONTRAST  CT CERVICAL SPINE WITHOUT CONTRAST TECHNIQUE: Multidetector CT imaging of the head and cervical spine was performed following the standard protocol without intravenous contrast. Multiplanar CT image reconstructions of the cervical spine were also generated. COMPARISON:  06/01/2017 FINDINGS: CT HEAD FINDINGS Brain: Mild superficial atrophy. Chronic small vessel ischemic disease and left basal ganglial lacunar infarcts are redemonstrated. No acute intracranial hemorrhage, large vascular territory infarct, edema or shift. No intra-axial mass nor extra-axial fluid collections. Vascular: Mild atherosclerosis of the carotid siphons. No hyperdense vessels. Skull: No skull fracture or significant scalp soft tissue swelling. Sinuses/Orbits: No acute finding. Other: None. CT CERVICAL SPINE FINDINGS Alignment: Maintained cervical lordosis. Osteoarthritic joint space narrowing and spurring about the atlantodental interval. Minimal anterolisthesis of C4 on C5 likely degenerative in etiology. Skull base and vertebrae: Cervical vertebral bodies and posterior elements appear intact. Moderate C6-7 disc space flattening with endplate spurring compatible with degenerative disc disease. Soft tissues and spinal canal: No prevertebral fluid or swelling. No visible canal hematoma. Mild calcific atherosclerosis of the carotid bifurcations. Disc levels: No significant osseous canal stenosis. Mild bilateral C6-7 neural foraminal narrowing. Bilateral C3-4, left C5-6 and bilateral C6-7 uncovertebral joint osteoarthritis with uncinate spurs. Upper chest: Negative. Other: None IMPRESSION: 1. No acute intracranial abnormality. Mild chronic small vessel ischemic disease and old left basal ganglial lacunar infarct. 2. No acute cervical spine fracture. Moderate disc flattening C6-7 with associated neural foraminal encroachment Electronically Signed   By: Ashley Royalty M.D.   On: 09/21/2017 00:48   Dg Chest Port 1 View  Result Date:  09/21/2017 CLINICAL DATA:  Patient lost balance and fell onto right hip. Preop right hip fracture. Daily smoker with history of COPD. EXAM: PORTABLE CHEST 1 VIEW COMPARISON:  04/06/2017 FINDINGS: Patient is rotated on the current study. Heart size is within normal limits. There is aortic atherosclerosis. Hyperinflated lungs without pneumonic consolidation or pneumothorax. Costophrenic angles are excluded on this study. No acute osseous abnormality of the bony thorax. IMPRESSION: Clear lungs.  Aortic atherosclerosis. Electronically Signed   By: Ashley Royalty M.D.   On: 09/21/2017 01:02   Dg Hip Unilat W Or Wo Pelvis 2-3 Views Right  Result Date: 09/21/2017 CLINICAL DATA:  Lost balance, fell tonight. History of stroke and RIGHT hemiplegia. EXAM: DG HIP (WITH OR WITHOUT PELVIS) 2-3V RIGHT COMPARISON:  None. FINDINGS: Acute RIGHT femur intertrochanteric fracture with internal rotation. Laterally directed fracture apex. No dislocation. No destructive bony lesions. Osteopenia. Mild vascular calcifications. IMPRESSION: Acute displaced RIGHT femur intertrochanteric fracture. Electronically Signed   By: Elon Alas M.D.   On: 09/21/2017 01:00    ____________________________________________   PROCEDURES  Procedure(s) performed: None  Procedures  Critical Care performed: No  ____________________________________________   INITIAL IMPRESSION / ASSESSMENT AND PLAN / ED COURSE  As part of my medical decision making, I reviewed the following data within the electronic MEDICAL RECORD NUMBER Notes from prior ED visits and [place] Controlled Substance Database   This is a 62 year old male who comes into the hospital today with some right hip pain.  My differential diagnosis includes contusion, hip fracture  We did send some blood work on the patient to include a CBC and a CMP.  The patient's sodium is low at 127.  He also had some imaging and he does have a right hip fracture.  The patient will be admitted to  the hospitalist service and I will contact orthopedic surgery for repair of the patient's hip fracture.     I contacted Dr. Roland Rack the orthopedic surgeon regarding the patient.  He reports that he will arrange that the patient may have surgery in the morning.  The patient will be admitted to the hospitalist service. ____________________________________________   FINAL CLINICAL IMPRESSION(S) / ED DIAGNOSES  Final diagnoses:  Closed fracture of right hip, initial encounter [facility])  Hyponatremia     ED Discharge Orders    None       Note:  This document was prepared using Dragon voice recognition software and may include unintentional dictation errors.    Loney Hering, MD 09/21/17 [phone]

## 2017-09-21 NOTE — Anesthesia Procedure Notes (Addendum)
Spinal  Patient location during procedure: OR Start time: 09/21/2017 5:46 PM End time: 09/21/2017 5:54 PM Staffing Anesthesiologist: Alvin Critchley, MD Resident/CRNA: Lance Muss, CRNA Performed: resident/CRNA  Preanesthetic Checklist Completed: patient identified, site marked, surgical consent, pre-op evaluation, timeout performed, IV checked, risks and benefits discussed and monitors and equipment checked Spinal Block Patient position: left lateral decubitus Prep: Betadine Patient monitoring: heart rate and continuous pulse ox Approach: midline Location: L3-4 Injection technique: single-shot Needle Needle type: Pencan  Needle gauge: 24 G Needle length: 9 cm

## 2017-09-22 ENCOUNTER — Encounter: Payer: Self-pay | Admitting: Surgery

## 2017-09-22 LAB — CBC
HCT: 21.9 % — ABNORMAL LOW (ref 40.0–52.0)
Hemoglobin: 7.4 g/dL — ABNORMAL LOW (ref 13.0–18.0)
MCH: 29.4 pg (ref 26.0–34.0)
MCHC: 33.6 g/dL (ref 32.0–36.0)
MCV: 87.7 fL (ref 80.0–100.0)
Platelets: 136 10*3/uL — ABNORMAL LOW (ref 150–440)
RBC: 2.5 MIL/uL — ABNORMAL LOW (ref 4.40–5.90)
RDW: 14.5 % (ref 11.5–14.5)
WBC: 6.4 10*3/uL (ref 3.8–10.6)

## 2017-09-22 LAB — BASIC METABOLIC PANEL
Anion gap: 7 (ref 5–15)
BUN: 12 mg/dL (ref 6–20)
CO2: 20 mmol/L — ABNORMAL LOW (ref 22–32)
Calcium: 7.4 mg/dL — ABNORMAL LOW (ref 8.9–10.3)
Chloride: 103 mmol/L (ref 101–111)
Creatinine, Ser: 1.04 mg/dL (ref 0.61–1.24)
GFR calc Af Amer: >60 mL/min (ref >60)
GFR calc non Af Amer: >60 mL/min (ref >60)
Glucose, Bld: 95 mg/dL (ref 65–99)
Potassium: 3.6 mmol/L (ref 3.5–5.1)
Sodium: 130 mmol/L — ABNORMAL LOW (ref 135–145)

## 2017-09-22 LAB — HEMOGLOBIN: Hemoglobin: 7.4 g/dL — ABNORMAL LOW (ref 13.0–18.0)

## 2017-09-22 MED ORDER — NICOTINE 21 MG/24HR TD PT24
21.0000 mg | MEDICATED_PATCH | Freq: Every day | TRANSDERMAL | Status: DC
  Administered 2017-09-22 – 2017-09-24 (×3): 21 mg via TRANSDERMAL
  Filled 2017-09-22 (×3): qty 1

## 2017-09-22 NOTE — Progress Notes (Signed)
New referral for out patient PALLIATIVE to follow at home received from [facility]. Possible discharge tomorrow 2/15. Patient information faxed to referral. Flo Shanks RN, BSN, [facility], hospital liaison [phone]

## 2017-09-22 NOTE — Anesthesia Postprocedure Evaluation (Signed)
Anesthesia Post Note  Patient: David Hall.  Procedure(s) Performed: INTRAMEDULLARY (IM) NAIL INTERTROCHANTRIC (Right Hip)  Patient location during evaluation: Nursing Unit Anesthesia Type: Spinal Level of consciousness: awake and alert and oriented Pain management: pain level controlled Vital Signs Assessment: post-procedure vital signs reviewed and stable Respiratory status: spontaneous breathing Cardiovascular status: stable Postop Assessment: no headache, no apparent nausea or vomiting and adequate PO intake Anesthetic complications: no     Last Vitals:  Vitals:   09/22/17 0324 09/22/17 1121  BP: (!) 103/58 (!) 96/54  Pulse: 67 73  Resp: 19 18  Temp: 36.7 C 36.4 C  SpO2: 94% 91%    Last Pain:  Vitals:   09/22/17 1121  TempSrc: Oral  PainSc:                  Lanora Manis

## 2017-09-22 NOTE — Evaluation (Signed)
Physical Therapy Evaluation Patient Details Name: David Hall. MRN: [id number] DOB: [birth year] Today's Date: 09/22/2017   History of Present Illness  62 y/o male with fall and R hip fx and subsequent ORIF.  Pt with h/o CVA with signficant R sided weakness.  Clinical Impression  Pt with baseline very limited ambulation and typically uses electric w/c, but was actually able to stand and take a few side shuffling/turning steps to get to the recliner.  He c/o considerable pain in R hip with any activity but was able to participate none the less.  He has baseline hemiplegia on R from old CVA but showed good effort with AAROM exercises and general mobility despite a lack of enthusiasm to do a lot.  Pt likely will not qualify for STR re: insurance but is not interested any way stating "I'm going home so it doesn't matter." will need HHPT.    Follow Up Recommendations Home health PT(pt not interested in rehab, states he's going home)    Equipment Recommendations       Recommendations for Other Services       Precautions / Restrictions Precautions Precautions: Fall Restrictions Weight Bearing Restrictions: Yes RLE Weight Bearing: Weight bearing as tolerated      Mobility  Bed Mobility Overal bed mobility: Needs Assistance Bed Mobility: Supine to Sit     Supine to sit: Mod assist     General bed mobility comments: Pt made an effort to get up to sitting w/o assist but could not initiate lifting torso, ultimately needed considerable assist to get to sitting upright  Transfers Overall transfer level: Needs assistance Equipment used: Rolling walker (2 wheeled) Transfers: Sit to/from Stand Sit to Stand: Min assist         General transfer comment: Pt needed light assist to get to fully upright, but did relatively well considering baseline weakness with new onset R hip pain.    Ambulation/Gait Ambulation/Gait assistance: Min assist Ambulation Distance (Feet): 3  Feet Assistive device: Rolling walker (2 wheeled)       General Gait Details: Pt able to take a few small shuffling steps to get to recliner from bed.  R LE able to maintain weight, no buckling. R UE on walker and did not slip off.  Pt likely would have struggled to do much more, but did relatively well with getting to recliner; heavy reliance on the walker  Stairs            Wheelchair Mobility    Modified Rankin (Stroke Patients Only)       Balance Overall balance assessment: Needs assistance Sitting-balance support: Feet supported Sitting balance-Leahy Scale: Fair     Standing balance support: Bilateral upper extremity supported Standing balance-Leahy Scale: Fair                               Pertinent Vitals/Pain Pain Assessment: 0-10 Pain Score: 8  Pain Location: R hip    Home Living Family/patient expects to be discharged to:: Private residence Living Arrangements: Non-relatives/Friends Available Help at Discharge: Available 24 hours/day   Home Access: Ramped entrance     Home Layout: One level Home Equipment: [place] - 2 wheels;Wheelchair - power;Bedside commode      Prior Function Level of Independence: Needs assistance         Comments: Pt reports that he generally uses electric w/c, but does occasionally do some limited walking in the home.  Uses  BSC or bathroom depending on the day, spends most of his time in bed.     Hand Dominance        Extremity/Trunk Assessment   Upper Extremity Assessment Upper Extremity Assessment: Generalized weakness(L grossly WFL, R with chronic weakness 2-/5 at most)    Lower Extremity Assessment Lower Extremity Assessment: Generalized weakness    Cervical / Trunk Assessment Cervical / Trunk Assessment: (L grossly 4-/5 and WFL, R chronically very weak)  Communication   Communication: No difficulties  Cognition Arousal/Alertness: Awake/alert Behavior During Therapy: (distracted) Overall  Cognitive Status: Within Functional Limits for tasks assessed                                        General Comments      Exercises General Exercises - Lower Extremity Ankle Circles/Pumps: AAROM;10 reps;Right Quad Sets: Strengthening;10 reps;Right Gluteal Sets: Strengthening;10 reps;Right Long Arc Quad: AAROM;5 reps;Right Hip ABduction/ADduction: AAROM;10 reps;Right   Assessment/Plan    PT Assessment Patient needs continued PT services  PT Problem List Decreased strength;Decreased activity tolerance;Decreased range of motion;Decreased balance;Decreased mobility;Decreased coordination;Decreased cognition;Decreased knowledge of use of DME;Decreased safety awareness;Pain       PT Treatment Interventions Gait training;Functional mobility training;Therapeutic activities;Therapeutic exercise;DME instruction;Balance training;Neuromuscular re-education;Patient/family education    PT Goals (Current goals can be found in the Care Plan section)  Acute Rehab PT Goals Patient Stated Goal: go home PT Goal Formulation: With patient Time For Goal Achievement: 10/06/17 Potential to Achieve Goals: Fair    Frequency Min 2X/week   Barriers to discharge        Co-evaluation               AM-PAC PT "6 Clicks" Daily Activity  Outcome Measure Difficulty turning over in bed (including adjusting bedclothes, sheets and blankets)?: A Lot Difficulty moving from lying on back to sitting on the side of the bed? : Unable Difficulty sitting down on and standing up from a chair with arms (e.g., wheelchair, bedside commode, etc,.)?: Unable Help needed moving to and from a bed to chair (including a wheelchair)?: A Little Help needed walking in hospital room?: A Lot Help needed climbing 3-5 steps with a railing? : Total 6 Click Score: 10    End of Session Equipment Utilized During Treatment: Gait belt Activity Tolerance: Patient limited by fatigue;Patient limited by pain Patient  left: with bed alarm set;with call bell/phone within reach Nurse Communication: Mobility status PT Visit Diagnosis: Muscle weakness (generalized) (M62.81);Difficulty in walking, not elsewhere classified (R26.2)    Time: 9643-8381 PT Time Calculation (min) (ACUTE ONLY): 29 min   Charges:   PT Evaluation $PT Eval Low Complexity: 1 Low PT Treatments $Therapeutic Exercise: 8-22 mins   PT G Codes:        Kreg Shropshire, DPT 09/22/2017, 2:03 PM

## 2017-09-22 NOTE — Progress Notes (Signed)
Physical Therapy Treatment Patient Details Name: David Hall. MRN: [id number] DOB: 03-Feb-1956 Today's Date: 09/22/2017    History of Present Illness 62 y/o male with fall and R hip fx and subsequent ORIF.  Pt with h/o CVA with signficant R sided weakness.    PT Comments    Pt is laying in bed, continues to c/o a lot of pain in hip though he is not in obvious distress.  Pt showed good effort with exercises and did appear to have increased control/strength in R hip this afternoon though his baseline weakness still clearly confounds exercises.  He reports he will try getting up/walking again tomorrow, but is not interested in trying mobility this afternoon.  Follow Up Recommendations  Home health PT     Equipment Recommendations       Recommendations for Other Services       Precautions / Restrictions Precautions Precautions: Fall Restrictions Weight Bearing Restrictions: Yes RLE Weight Bearing: Weight bearing as tolerated    Mobility  Bed Mobility Overal bed mobility: Needs Assistance Bed Mobility: Supine to Sit     Supine to sit: Mod assist     General bed mobility comments: Pt back in bed by the time PT come by this afternoon, does not want to try getting up again.  (states he was up sitting ~1 hr)  Transfers Overall transfer level: Needs assistance Equipment used: Rolling walker (2 wheeled) Transfers: Sit to/from Stand Sit to Stand: Min assist         General transfer comment: Pt needed light assist to get to fully upright, but did relatively well considering baseline weakness with new onset R hip pain.    Ambulation/Gait Ambulation/Gait assistance: Min assist Ambulation Distance (Feet): 3 Feet Assistive device: Rolling walker (2 wheeled)       General Gait Details: Pt able to take a few small shuffling steps to get to recliner from bed.  R LE able to maintain weight, no buckling. R UE on walker and did not slip off.  Pt likely would have struggled  to do much more, but did relatively well with getting to recliner; heavy reliance on the walker   Stairs            Wheelchair Mobility    Modified Rankin (Stroke Patients Only)       Balance Overall balance assessment: Needs assistance Sitting-balance support: Feet supported Sitting balance-Leahy Scale: Fair     Standing balance support: Bilateral upper extremity supported Standing balance-Leahy Scale: Fair                              Cognition Arousal/Alertness: Awake/alert Behavior During Therapy: (distracted) Overall Cognitive Status: Within Functional Limits for tasks assessed                                        Exercises General Exercises - Lower Extremity Ankle Circles/Pumps: AAROM;10 reps;Right Quad Sets: Strengthening;10 reps;Right Gluteal Sets: Strengthening;10 reps;Right Short Arc Quad: AROM;10 reps;Right Long Arc Quad: AAROM;5 reps;Right Heel Slides: Strengthening;10 reps Hip ABduction/ADduction: 10 reps;Right;AROM    General Comments        Pertinent Vitals/Pain Pain Assessment: 0-10 Pain Score: 8  Pain Location: R hip    Home Living Family/patient expects to be discharged to:: Private residence Living Arrangements: Non-relatives/Friends Available Help at Discharge: Available 24 hours/day  Home Access: Ramped entrance   Home Layout: One level Home Equipment: [place] - 2 wheels;Wheelchair - power;Bedside commode      Prior Function Level of Independence: Needs assistance      Comments: Pt reports that he generally uses electric w/c, but does occasionally do some limited walking in the home.  Uses BSC or bathroom depending on the day, spends most of his time in bed.   PT Goals (current goals can now be found in the care plan section) Acute Rehab PT Goals Patient Stated Goal: go home PT Goal Formulation: With patient Time For Goal Achievement: 10/06/17 Potential to Achieve Goals: Fair Progress towards  PT goals: Progressing toward goals    Frequency    Min 2X/week      PT Plan Current plan remains appropriate    Co-evaluation              AM-PAC PT "6 Clicks" Daily Activity  Outcome Measure  Difficulty turning over in bed (including adjusting bedclothes, sheets and blankets)?: A Lot Difficulty moving from lying on back to sitting on the side of the bed? : Unable Difficulty sitting down on and standing up from a chair with arms (e.g., wheelchair, bedside commode, etc,.)?: Unable Help needed moving to and from a bed to chair (including a wheelchair)?: A Little Help needed walking in hospital room?: A Lot Help needed climbing 3-5 steps with a railing? : Total 6 Click Score: 10    End of Session Equipment Utilized During Treatment: Gait belt Activity Tolerance: Patient limited by fatigue;Patient limited by pain Patient left: with bed alarm set;with call bell/phone within reach Nurse Communication: Mobility status PT Visit Diagnosis: Muscle weakness (generalized) (M62.81);Difficulty in walking, not elsewhere classified (R26.2)     Time: 3491-7915 PT Time Calculation (min) (ACUTE ONLY): 15 min  Charges:  $Therapeutic Exercise: 8-22 mins                    G Codes:       Kreg Shropshire, DPT 09/22/2017, 3:58 PM

## 2017-09-22 NOTE — Progress Notes (Signed)
  Subjective: 1 Day Post-Op Procedure(s) (LRB): INTRAMEDULLARY (IM) NAIL INTERTROCHANTRIC (Right) Patient reports pain as mild.   Patient is well, and has had no acute complaints or problems PT and care management to assist with discharge planning, current plan is for discharge home. Negative for chest pain and shortness of breath Fever: no Gastrointestinal:Negative for nausea and vomiting  Objective: Vital signs in last 24 hours: Temp:  [97.4 F (36.3 C)-98.5 F (36.9 C)] 98 F (36.7 C) (02/14 0324) Pulse Rate:  [65-78] 67 (02/14 0324) Resp:  [13-20] 19 (02/14 0324) BP: (92-141)/(48-72) 103/58 (02/14 0324) SpO2:  [94 %-100 %] 94 % (02/14 0324) Weight:  [65.9 kg (145 lb 4.8 oz)-66.6 kg (146 lb 14.4 oz)] 66.6 kg (146 lb 14.4 oz) (02/14 0534)  Intake/Output from previous day:  Intake/Output Summary (Last 24 hours) at 09/22/2017 0755 Last data filed at 09/22/2017 1962 Gross per 24 hour  Intake 2607.5 ml  Output 1475 ml  Net 1132.5 ml    Intake/Output this shift: No intake/output data recorded.  Labs: Recent Labs    09/20/17 2335 09/22/17 0504  HGB 11.1* 7.4*   Recent Labs    09/20/17 2335 09/22/17 0504  WBC 6.3 6.4  RBC 3.73* 2.50*  HCT 32.5* 21.9*  PLT 232 136*   Recent Labs    09/20/17 2335 09/22/17 0504  NA 127* 130*  K 3.2* 3.6  CL 93* 103  CO2 25 20*  BUN 10 12  CREATININE 1.02 1.04  GLUCOSE 138* 95  CALCIUM 8.4* 7.4*   No results for input(s): LABPT, INR in the last 72 hours.   EXAM General - Patient is Alert, Appropriate and Oriented Extremity - ABD soft Sensation intact distally Intact pulses distally Dorsiflexion/Plantar flexion intact Incision: no drainage No cellulitis present Dressing/Incision - clean, dry, no drainage Motor Function - intact, moving foot and toes well on exam.   Past Medical History:  Diagnosis Date  . Bipolar disorder ([place])   . COPD (chronic obstructive pulmonary disease) ([place])    on home oxygen.   [name] GERD  (gastroesophageal reflux disease)   . Hyperlipidemia   . Hypertension   . Insomnia, controlled   . Obesity   . Oxygen deficiency    2 LNC at night  . Shortness of breath   . Stroke [facility]) 2014   R sided hemiplegia  . Substance abuse ([place]) 2016   cocaine use.    Assessment/Plan: 1 Day Post-Op Procedure(s) (LRB): INTRAMEDULLARY (IM) NAIL INTERTROCHANTRIC (Right) Active Problems:   Hip fracture ([facility])  Estimated body mass index is 22.34 kg/m as calculated from the following:   Height as of this encounter: 5\' 8"  (1.727 m).   Weight as of this encounter: 66.6 kg (146 lb 14.4 oz). Advance diet Up with therapy D/C IV fluids when tolerating po intake.  Labs reviewed this AM, Hg 7.4, HCT 21.9. Pt denies any dizziness or lightheadedness.  Does not appear pale in the room.  Continue to monitor, may need transfusion.  CBC tomorrow morning ordered. Na 130 this AM, will continue IVF until tolerating a po diet, encouraged increased oral intake. BMP ordered for tomorrow morning as well.  DVT Prophylaxis - Lovenox, Foot Pumps and TED hose Weight-Bearing as tolerated to right leg  J. Cameron Proud, PA-C [facility] Orthopaedic Surgery 09/22/2017, 7:55 AM

## 2017-09-22 NOTE — Progress Notes (Signed)
Case discussed with attending MD. Plan is for patient to D/C home with home health PT and palliative care. RN case manager aware of above.   [employer], LCSW [phone]

## 2017-09-22 NOTE — Care Management (Signed)
Referral to [facility] for home health PT and SW.  Referral to [facility] with home palliative team per Dr. Trena Platt request. RNCM will follow.

## 2017-09-22 NOTE — Progress Notes (Signed)
[place] [facility] [place] NAME: David Hall    MR#:  [id number]  DATE OF BIRTH:  [birth year]  SUBJECTIVE:  CHIEF COMPLAINT:   Chief Complaint  Patient presents with  . Fall  POD 1, cough + REVIEW OF SYSTEMS:  Review of Systems  Constitutional: Positive for malaise/fatigue. Negative for chills, fever and weight loss.  HENT: Negative for nosebleeds and sore throat.   Eyes: Negative for blurred vision.  Respiratory: Positive for cough. Negative for shortness of breath and wheezing.   Cardiovascular: Negative for chest pain, orthopnea, leg swelling and PND.  Gastrointestinal: Negative for abdominal pain, constipation, diarrhea, heartburn, nausea and vomiting.  Genitourinary: Negative for dysuria and urgency.  Musculoskeletal: Positive for falls and joint pain. Negative for back pain.  Skin: Negative for rash.  Neurological: Positive for weakness. Negative for dizziness, speech change, focal weakness and headaches.  Endo/Heme/Allergies: Does not bruise/bleed easily.  Psychiatric/Behavioral: Negative for depression.   DRUG ALLERGIES:   Allergies  Allergen Reactions  . Quetiapine Other (See Comments)    "feels like someone is sticking pins in my legs." "makes me feel like someone is sticking needles and knives in my legs"  . Seroquel [Quetiapine Fumarate] Other (See Comments)    "feels like someone is sticking pins in my legs."  . Codeine Itching and Rash   VITALS:  Blood pressure (!) 103/58, pulse 67, temperature 98 F (36.7 C), temperature source Oral, resp. rate 19, height 5\' 8"  (1.727 m), weight 66.6 kg (146 lb 14.4 oz), SpO2 94 %. PHYSICAL EXAMINATION:  Physical Exam  Constitutional: He is oriented to person, place, and time and well-developed, well-nourished, and in no distress.  HENT:  Head: Normocephalic and atraumatic.  Eyes: Conjunctivae and EOM are normal. Pupils are equal, round, and reactive to light.  Neck: Normal range  of motion. Neck supple. No tracheal deviation present. No thyromegaly present.  Cardiovascular: Normal rate, regular rhythm and normal heart sounds.  Pulmonary/Chest: Effort normal and breath sounds normal. No respiratory distress. He has no wheezes. He exhibits no tenderness.  Abdominal: Soft. Bowel sounds are normal. He exhibits no distension. There is no tenderness.  Musculoskeletal: Normal range of motion.       Right hip: He exhibits tenderness.  Neurological: He is alert and oriented to person, place, and time. No cranial nerve deficit.  Skin: Skin is warm and dry. No rash noted.  Psychiatric: Mood and affect normal.   LABORATORY PANEL:  Male CBC Recent Labs  Lab 09/22/17 0504  WBC 6.4  HGB 7.4*  HCT 21.9*  PLT 136*   ------------------------------------------------------------------------------------------------------------------ Chemistries  Recent Labs  Lab 09/20/17 2335 09/22/17 0504  NA 127* 130*  K 3.2* 3.6  CL 93* 103  CO2 25 20*  GLUCOSE 138* 95  BUN 10 12  CREATININE 1.02 1.04  CALCIUM 8.4* 7.4*  AST 17  --   ALT 9*  --   ALKPHOS 89  --   BILITOT 0.5  --    RADIOLOGY:  Dg Hip Operative Unilat W Or W/o Pelvis Right  Result Date: 09/21/2017 CLINICAL DATA:  Internal fixation right femoral fracture EXAM: OPERATIVE RIGHT HIP (WITH PELVIS IF PERFORMED) 4 VIEWS TECHNIQUE: Fluoroscopic spot image(s) were submitted for interpretation post-operatively. COMPARISON:  09/21/2017 FINDINGS: Internal fixation across the right intertrochanteric femoral fracture. Anatomic alignment. No hardware complicating feature. IMPRESSION: Internal fixation.  No complicating feature. Electronically Signed   By: Rolm Baptise M.D.   On: 09/21/2017 19:24  ASSESSMENT AND PLAN:   * Hypotension with h/o HTN: monitor, avoid BP meds (stopping imdur, metoprolol). He's asymptomatic. I wouldn't do anything different at this point unless Hb drops < 7, then 1 PRBC transfusion * Anemia: acute  blood loss during surgery. Monitor * Hyponatremia/Hypokalemia: replete and recheck, Continue NS * Right femur intertrochanteric fracture: please note at baseline patient has had a stroke with residual right-sided paralysis and is wheelchair-bound.  May not have much functional recovery even after surgery - would recommend D/C back home with home health, PT (no STR need)  3. COPD: Stable; continue inhaled corticosteroid. Albuterol as needed. 4. Bipolar disorder: Continue Rexultiand Cymbalta 5. History of stroke: resume aspirin  6. DVT prophylaxis: Lovenox  Plan for D/C home with home health (has 7 days/week caregiver/n.aid) - will add PT at D/C   All the records are reviewed and case discussed with Care Management/Social Worker. Management plans discussed with the patient, nursing and they are in agreement.  CODE STATUS: DNR  TOTAL TIME TAKING CARE OF THIS PATIENT: 35 minutes.   More than 50% of the time was spent in counseling/coordination of care: YES  POSSIBLE D/C IN 1 DAYS, DEPENDING ON CLINICAL CONDITION.   Max Sane M.D on 09/22/2017 at 8:29 AM  Between 7am to 6pm - Pager - [phone]  After 6pm go to [email or web address] - Proofreader  [facility] [place] [facility]  Office  [phone]  CC: Primary care physician; Olin Hauser, DO  Note: This dictation was prepared with Dragon dictation along with smaller phrase technology. Any transcriptional errors that result from this process are unintentional.

## 2017-09-23 ENCOUNTER — Inpatient Hospital Stay: Payer: Medicare Other

## 2017-09-23 ENCOUNTER — Telehealth: Payer: Self-pay | Admitting: Family Medicine

## 2017-09-23 DIAGNOSIS — S72001A Fracture of unspecified part of neck of right femur, initial encounter for closed fracture: Secondary | ICD-10-CM

## 2017-09-23 DIAGNOSIS — J432 Centrilobular emphysema: Secondary | ICD-10-CM

## 2017-09-23 DIAGNOSIS — R296 Repeated falls: Secondary | ICD-10-CM

## 2017-09-23 LAB — IRON AND TIBC
Iron: 54 ug/dL (ref 45–182)
Saturation Ratios: 32 % (ref 17.9–39.5)
TIBC: 171 ug/dL — ABNORMAL LOW (ref 250–450)
UIBC: 117 ug/dL

## 2017-09-23 LAB — CBC
HCT: 21.5 % — ABNORMAL LOW (ref 40.0–52.0)
Hemoglobin: 7.2 g/dL — ABNORMAL LOW (ref 13.0–18.0)
MCH: 29.4 pg (ref 26.0–34.0)
MCHC: 33.4 g/dL (ref 32.0–36.0)
MCV: 88.1 fL (ref 80.0–100.0)
Platelets: 127 10*3/uL — ABNORMAL LOW (ref 150–440)
RBC: 2.44 MIL/uL — ABNORMAL LOW (ref 4.40–5.90)
RDW: 14.9 % — ABNORMAL HIGH (ref 11.5–14.5)
WBC: 7.8 10*3/uL (ref 3.8–10.6)

## 2017-09-23 LAB — BASIC METABOLIC PANEL
Anion gap: 7 (ref 5–15)
BUN: 9 mg/dL (ref 6–20)
CO2: 21 mmol/L — ABNORMAL LOW (ref 22–32)
Calcium: 7.9 mg/dL — ABNORMAL LOW (ref 8.9–10.3)
Chloride: 100 mmol/L — ABNORMAL LOW (ref 101–111)
Creatinine, Ser: 0.88 mg/dL (ref 0.61–1.24)
GFR calc Af Amer: >60 mL/min (ref >60)
GFR calc non Af Amer: >60 mL/min (ref >60)
Glucose, Bld: 85 mg/dL (ref 65–99)
Potassium: 3.7 mmol/L (ref 3.5–5.1)
Sodium: 128 mmol/L — ABNORMAL LOW (ref 135–145)

## 2017-09-23 LAB — FERRITIN: Ferritin: 301 ng/mL (ref 24–336)

## 2017-09-23 LAB — HEMOGLOBIN AND HEMATOCRIT, BLOOD
HCT: 25.6 % — ABNORMAL LOW (ref 40.0–52.0)
Hemoglobin: 8.6 g/dL — ABNORMAL LOW (ref 13.0–18.0)

## 2017-09-23 LAB — RETICULOCYTES
RBC.: 2.85 MIL/uL — ABNORMAL LOW (ref 4.40–5.90)
Retic Count, Absolute: 31.4 10*3/uL (ref 19.0–183.0)
Retic Ct Pct: 1.1 % (ref 0.4–3.1)

## 2017-09-23 LAB — FOLATE: Folate: 3.4 ng/mL — ABNORMAL LOW (ref >5.9)

## 2017-09-23 LAB — ABO/RH: ABO/RH(D): A POS

## 2017-09-23 LAB — PREPARE RBC (CROSSMATCH)

## 2017-09-23 MED ORDER — LEVOFLOXACIN IN D5W 500 MG/100ML IV SOLN
500.0000 mg | INTRAVENOUS | Status: DC
  Administered 2017-09-23: 500 mg via INTRAVENOUS
  Filled 2017-09-23 (×3): qty 100

## 2017-09-23 MED ORDER — SODIUM CHLORIDE 0.9 % IV SOLN
Freq: Once | INTRAVENOUS | Status: AC
  Administered 2017-09-23: 11:00:00 via INTRAVENOUS

## 2017-09-23 MED ORDER — CALCIUM CARBONATE ANTACID 500 MG PO CHEW
2.0000 | CHEWABLE_TABLET | ORAL | Status: DC | PRN
  Administered 2017-09-23: 400 mg via ORAL
  Filled 2017-09-23: qty 2

## 2017-09-23 MED ORDER — IOPAMIDOL (ISOVUE-300) INJECTION 61%
15.0000 mL | INTRAVENOUS | Status: AC
  Administered 2017-09-23 (×2): 15 mL via ORAL

## 2017-09-23 MED ORDER — SODIUM CHLORIDE 0.9 % IV SOLN
INTRAVENOUS | Status: DC
  Administered 2017-09-23: 18:00:00 via INTRAVENOUS

## 2017-09-23 NOTE — Progress Notes (Signed)
[facility] [facility] [place] NAME: David Hall    MR#:  [id number]  DATE OF BIRTH:  17-Feb-1956  SUBJECTIVE:  CHIEF COMPLAINT:   Chief Complaint  Patient presents with  . Fall  POD 2, cough +, weak, tachycardic REVIEW OF SYSTEMS:  Review of Systems  Constitutional: Positive for malaise/fatigue. Negative for chills, fever and weight loss.  HENT: Negative for nosebleeds and sore throat.   Eyes: Negative for blurred vision.  Respiratory: Positive for cough. Negative for shortness of breath and wheezing.   Cardiovascular: Negative for chest pain, orthopnea, leg swelling and PND.  Gastrointestinal: Negative for abdominal pain, constipation, diarrhea, heartburn, nausea and vomiting.  Genitourinary: Negative for dysuria and urgency.  Musculoskeletal: Positive for falls and joint pain. Negative for back pain.  Skin: Negative for rash.  Neurological: Positive for weakness. Negative for dizziness, speech change, focal weakness and headaches.  Endo/Heme/Allergies: Does not bruise/bleed easily.  Psychiatric/Behavioral: Negative for depression.   DRUG ALLERGIES:   Allergies  Allergen Reactions  . Quetiapine Other (See Comments)    "feels like someone is sticking pins in my legs." "makes me feel like someone is sticking needles and knives in my legs"  . Seroquel [Quetiapine Fumarate] Other (See Comments)    "feels like someone is sticking pins in my legs."  . Codeine Itching and Rash   VITALS:  Blood pressure (!) 152/62, pulse (!) 108, temperature 97.6 F (36.4 C), temperature source Oral, resp. rate 20, height 5\' 8"  (1.727 m), weight 59.2 kg (130 lb 8.2 oz), SpO2 90 %. PHYSICAL EXAMINATION:  Physical Exam  Constitutional: He is oriented to person, place, and time and well-developed, well-nourished, and in no distress.  HENT:  Head: Normocephalic and atraumatic.  Eyes: Conjunctivae and EOM are normal. Pupils are equal, round, and reactive to  light.  Neck: Normal range of motion. Neck supple. No tracheal deviation present. No thyromegaly present.  Cardiovascular: Normal rate, regular rhythm and normal heart sounds.  Pulmonary/Chest: Effort normal and breath sounds normal. No respiratory distress. He has no wheezes. He exhibits no tenderness.  Abdominal: Soft. Bowel sounds are normal. He exhibits no distension. There is no tenderness.  Musculoskeletal: Normal range of motion.       Right hip: He exhibits tenderness.  Neurological: He is alert and oriented to person, place, and time. No cranial nerve deficit.  Skin: Skin is warm and dry. No rash noted.  Psychiatric: Mood and affect normal.   LABORATORY PANEL:  Male CBC Recent Labs  Lab 09/23/17 0608  WBC 7.8  HGB 7.2*  HCT 21.5*  PLT 127*   ------------------------------------------------------------------------------------------------------------------ Chemistries  Recent Labs  Lab 09/20/17 2335  09/23/17 0608  NA 127*   < > 128*  K 3.2*   < > 3.7  CL 93*   < > 100*  CO2 25   < > 21*  GLUCOSE 138*   < > 85  BUN 10   < > 9  CREATININE 1.02   < > 0.88  CALCIUM 8.4*   < > 7.9*  AST 17  --   --   ALT 9*  --   --   ALKPHOS 89  --   --   BILITOT 0.5  --   --    < > = values in this interval not displayed.   RADIOLOGY:  No results found. ASSESSMENT AND PLAN:   * Hypotension with h/o HTN: monitor, avoid BP meds (stopping imdur, metoprolol).  Hb continues to drop 7.4-> 7.2, will order 2 PRBC transfusion. * Anemia: unsure etio. Will get CT abd-pelvis to r/o hematoma, anemia panel * Hyponatremia/Hypokalemia: replete and recheck, Continue NS * Right femur intertrochanteric fracture: POD 2, please note at baseline patient has had a stroke with residual right-sided paralysis and is wheelchair-bound.  May not have much functional recovery even after surgery - recommend D/C back home with home health, PT (no STR need)  3. COPD: Stable; continue inhaled corticosteroid.  Albuterol as needed. 4. Bipolar disorder: Continue Rexultiand Cymbalta 5. History of stroke: hold aspirin due to anemia, can resume at D/C 6. DVT prophylaxis: Hold Lovenox due to anemia.  Plan for D/C home with home health (has 7 days/week caregiver/n.aid) - add PT at D/C   All the records are reviewed and case discussed with Care Management/Social Worker. Management plans discussed with the patient, nursing and they are in agreement.  CODE STATUS: DNR  TOTAL TIME TAKING CARE OF THIS PATIENT: 35 minutes.   More than 50% of the time was spent in counseling/coordination of care: YES  POSSIBLE D/C IN 1 DAYS, DEPENDING ON CLINICAL CONDITION.   Max Sane M.D on 09/23/2017 at 7:13 AM  Between 7am to 6pm - Pager - [phone]  After 6pm go to [email or web address] - Proofreader  [facility]  Office  [phone]  CC: Primary care physician; Olin Hauser, DO  Note: This dictation was prepared with Dragon dictation along with smaller phrase technology. Any transcriptional errors that result from this process are unintentional.

## 2017-09-23 NOTE — Progress Notes (Signed)
Physical Therapy Treatment Patient Details Name: David Hall. MRN: [id number] DOB: 1955-11-30 Today's Date: 09/23/2017    History of Present Illness 62 y/o male with fall and R hip fx and subsequent ORIF.  Pt with h/o CVA with signficant R sided weakness.    PT Comments    Unable to do AM session secondary to out of room for CT scan and transfusion.  Afternoon session pt had increased pain as well as some issues with R LE tremors.  He was not as able to participate with exercises, mobility and especially had issues with WBing on the R during standing.  Very limited steppage/"ambulation" tolerance.    Follow Up Recommendations  Home health PT     Equipment Recommendations       Recommendations for Other Services       Precautions / Restrictions Precautions Precautions: Fall Restrictions RLE Weight Bearing: Weight bearing as tolerated    Mobility  Bed Mobility Overal bed mobility: Needs Assistance Bed Mobility: Supine to Sit     Supine to sit: Max assist     General bed mobility comments: Pt able to do some scooting toward EOB, but needed increased assist today getting to sitting.  Transfers Overall transfer level: Needs assistance Equipment used: Rolling walker (2 wheeled) Transfers: Sit to/from Stand Sit to Stand: Mod assist         General transfer comment: Pt showed good effort, but was uanble to put R heel down/flat.  Did show good effort with pushing with L LE and lifting trunk to upright  Ambulation/Gait Ambulation/Gait assistance: Max assist Ambulation Distance (Feet): 3 Feet Assistive device: Rolling walker (2 wheeled)       General Gait Details: Pt was less able to use R LE effectively to do any WBing and struggled with getting to recliner.  Overall did not do as well with WBing/steppage.   Stairs            Wheelchair Mobility    Modified Rankin (Stroke Patients Only)       Balance Overall balance assessment: Needs  assistance   Sitting balance-Leahy Scale: Fair       Standing balance-Leahy Scale: Poor                              Cognition Arousal/Alertness: Awake/alert Behavior During Therapy: Restless Overall Cognitive Status: Within Functional Limits for tasks assessed                                        Exercises General Exercises - Lower Extremity Ankle Circles/Pumps: AAROM;10 reps;Right Quad Sets: AAROM;10 reps(Pt struggled with quad control today) Gluteal Sets: Strengthening;10 reps;Right Short Arc Quad: AAROM;5 reps(pt unable to initiate any movement today on his own) Heel Slides: Strengthening;10 reps Hip ABduction/ADduction: 10 reps;Right;AROM    General Comments        Pertinent Vitals/Pain Pain Assessment: 0-10 Pain Score: 9     Home Living                      Prior Function            PT Goals (current goals can now be found in the care plan section) Progress towards PT goals: Not progressing toward goals - comment(pt with more pain today)    Frequency    BID  PT Plan Current plan remains appropriate    Co-evaluation              AM-PAC PT "6 Clicks" Daily Activity  Outcome Measure  Difficulty turning over in bed (including adjusting bedclothes, sheets and blankets)?: Unable Difficulty moving from lying on back to sitting on the side of the bed? : Unable Difficulty sitting down on and standing up from a chair with arms (e.g., wheelchair, bedside commode, etc,.)?: Unable Help needed moving to and from a bed to chair (including a wheelchair)?: A Lot Help needed walking in hospital room?: Total Help needed climbing 3-5 steps with a railing? : Total 6 Click Score: 7    End of Session Equipment Utilized During Treatment: Gait belt Activity Tolerance: Patient limited by fatigue;Patient limited by pain Patient left: with chair alarm set;with call bell/phone within reach Nurse Communication: Mobility  status(need for +2 assist) PT Visit Diagnosis: Muscle weakness (generalized) (M62.81);Difficulty in walking, not elsewhere classified (R26.2)     Time: 4825-0037 PT Time Calculation (min) (ACUTE ONLY): 28 min  Charges:  $Gait Training: 8-22 mins $Therapeutic Exercise: 8-22 mins                    G Codes:       Kreg Shropshire, DPT 09/23/2017, 3:02 PM

## 2017-09-23 NOTE — Telephone Encounter (Signed)
[facility] called requesting a  In home order for pt. There call back # is [phone].......... Fax # [phone]

## 2017-09-23 NOTE — Progress Notes (Signed)
  Subjective: 2 Days Post-Op Procedure(s) (LRB): INTRAMEDULLARY (IM) NAIL INTERTROCHANTRIC (Right) Patient reports pain as mild.   Patient is well, and has had no acute complaints or problems Plan is for discharge home with HHPT. Negative for chest pain and shortness of breath Fever: no Gastrointestinal:Negative for nausea and vomiting  Objective: Vital signs in last 24 hours: Temp:  [97.5 F (36.4 C)-98.6 F (37 C)] 97.6 F (36.4 C) (02/15 0411) Pulse Rate:  [73-108] 108 (02/15 0411) Resp:  [18-20] 20 (02/14 2100) BP: (96-152)/(47-62) 152/62 (02/15 0411) SpO2:  [90 %-92 %] 90 % (02/15 0411) Weight:  [59.2 kg (130 lb 8.2 oz)] 59.2 kg (130 lb 8.2 oz) (02/15 0411)  Intake/Output from previous day:  Intake/Output Summary (Last 24 hours) at 09/23/2017 0721 Last data filed at 09/23/2017 0411 Gross per 24 hour  Intake 1692.83 ml  Output 1800 ml  Net -107.17 ml    Intake/Output this shift: No intake/output data recorded.  Labs: Recent Labs    09/20/17 2335 09/22/17 0504 09/22/17 0830 09/23/17 0608  HGB 11.1* 7.4* 7.4* 7.2*   Recent Labs    09/22/17 0504 09/23/17 0608  WBC 6.4 7.8  RBC 2.50* 2.44*  HCT 21.9* 21.5*  PLT 136* 127*   Recent Labs    09/22/17 0504 09/23/17 0608  NA 130* 128*  K 3.6 3.7  CL 103 100*  CO2 20* 21*  BUN 12 9  CREATININE 1.04 0.88  GLUCOSE 95 85  CALCIUM 7.4* 7.9*   No results for input(s): LABPT, INR in the last 72 hours.   EXAM General - Patient is Alert, Appropriate and Oriented Extremity - ABD soft Sensation intact distally Intact pulses distally Dorsiflexion/Plantar flexion intact Incision: scant drainage No cellulitis present Dressing/Incision - Moderate bloody drainage to the middle incision to the right hip. Motor Function - intact, moving foot and toes well on exam.   Past Medical History:  Diagnosis Date  . Bipolar disorder ([place])   . COPD (chronic obstructive pulmonary disease) ([place])    on home oxygen.   [name]  GERD (gastroesophageal reflux disease)   . Hyperlipidemia   . Hypertension   . Insomnia, controlled   . Obesity   . Oxygen deficiency    2 LNC at night  . Shortness of breath   . Stroke [facility]) 2014   R sided hemiplegia  . Substance abuse ([place]) 2016   cocaine use.    Assessment/Plan: 2 Days Post-Op Procedure(s) (LRB): INTRAMEDULLARY (IM) NAIL INTERTROCHANTRIC (Right) Active Problems:   Hip fracture (HCC)  Estimated body mass index is 19.84 kg/m as calculated from the following:   Height as of this encounter: 5\' 8"  (1.727 m).   Weight as of this encounter: 59.2 kg (130 lb 8.2 oz). Advance diet Up with therapy D/C IV fluids when tolerating po intake.  Labs reviewed this AM, Hg 7.2, internal medicine has ordered 2 units of PRBC. Na 128, continue NS Plan is for d/c home today with HHPT.  Upon discharge from the hospital, continue Lovenox 40mg  injected daily for 14 days. Follow-up with [facility] in 14 days for staple removal.  DVT Prophylaxis - Lovenox, Foot Pumps and TED hose Weight-Bearing as tolerated to right leg  J. Cameron Proud, PA-C [facility] 09/23/2017, 7:21 AM

## 2017-09-23 NOTE — Telephone Encounter (Signed)
Called back, spoke with Hilda Blades at hospice, they are requesting order for in home palliative care consult for goals of care discussion, need order, they will take verbal and fax back to [place] for signature. Spoke with Dominica given verbal, stay tuned for form/fax.  [name], [facility] 09/23/2017, 1:31 PM

## 2017-09-23 NOTE — Progress Notes (Signed)
One unit of blood finished transfusing. Pt tolerated well. Hemoglobin now 8.6. Per MD pt does not require another unit. Will monitor.

## 2017-09-24 LAB — BPAM RBC
Blood Product Expiration Date: 201903072359
ISSUE DATE / TIME: 201902151055
Unit Type and Rh: 6200

## 2017-09-24 LAB — CBC
HCT: 23.6 % — ABNORMAL LOW (ref 40.0–52.0)
Hemoglobin: 8.1 g/dL — ABNORMAL LOW (ref 13.0–18.0)
MCH: 30.2 pg (ref 26.0–34.0)
MCHC: 34.3 g/dL (ref 32.0–36.0)
MCV: 88.1 fL (ref 80.0–100.0)
Platelets: 132 10*3/uL — ABNORMAL LOW (ref 150–440)
RBC: 2.68 MIL/uL — ABNORMAL LOW (ref 4.40–5.90)
RDW: 14.9 % — ABNORMAL HIGH (ref 11.5–14.5)
WBC: 6.5 10*3/uL (ref 3.8–10.6)

## 2017-09-24 LAB — TYPE AND SCREEN
ABO/RH(D): A POS
Antibody Screen: NEGATIVE
Unit division: 0

## 2017-09-24 LAB — BASIC METABOLIC PANEL
Anion gap: 5 (ref 5–15)
BUN: 5 mg/dL — ABNORMAL LOW (ref 6–20)
CO2: 26 mmol/L (ref 22–32)
Calcium: 8.1 mg/dL — ABNORMAL LOW (ref 8.9–10.3)
Chloride: 100 mmol/L — ABNORMAL LOW (ref 101–111)
Creatinine, Ser: 0.73 mg/dL (ref 0.61–1.24)
GFR calc Af Amer: >60 mL/min (ref >60)
GFR calc non Af Amer: >60 mL/min (ref >60)
Glucose, Bld: 86 mg/dL (ref 65–99)
Potassium: 4.1 mmol/L (ref 3.5–5.1)
Sodium: 131 mmol/L — ABNORMAL LOW (ref 135–145)

## 2017-09-24 LAB — VITAMIN B12: Vitamin B-12: 297 pg/mL (ref 180–914)

## 2017-09-24 MED ORDER — MUPIROCIN 2 % EX OINT: 1.0000 "application " | TOPICAL_OINTMENT | Freq: Two times a day (BID) | CUTANEOUS | 0 refills | Status: DC

## 2017-09-24 MED ORDER — BISACODYL 10 MG RE SUPP: 10.0000 mg | Freq: Every day | RECTAL | 0 refills | Status: DC | PRN

## 2017-09-24 MED ORDER — ENOXAPARIN SODIUM 40 MG/0.4ML ~~LOC~~ SOLN: 40.0000 mg | SUBCUTANEOUS | 0 refills | Status: DC

## 2017-09-24 MED ORDER — OXYCODONE HCL 5 MG PO TABS: 5.0000 mg | ORAL_TABLET | ORAL | 0 refills | Status: DC | PRN

## 2017-09-24 MED ORDER — DIVALPROEX SODIUM 500 MG PO DR TAB: 500.0000 mg | DELAYED_RELEASE_TABLET | Freq: Two times a day (BID) | ORAL | 0 refills | Status: DC

## 2017-09-24 MED ORDER — LEVOFLOXACIN 500 MG PO TABS: 500.0000 mg | ORAL_TABLET | Freq: Every day | ORAL | 0 refills | Status: AC

## 2017-09-24 MED ORDER — ALBUTEROL SULFATE (2.5 MG/3ML) 0.083% IN NEBU: 2.5000 mg | INHALATION_SOLUTION | Freq: Four times a day (QID) | RESPIRATORY_TRACT | 12 refills | Status: DC | PRN

## 2017-09-24 NOTE — Progress Notes (Signed)
Physical Therapy Treatment Patient Details Name: David Hall. MRN: [id number] DOB: 25-Jun-1956 Today's Date: 09/24/2017    History of Present Illness 62 y/o male with fall and R hip fx and subsequent ORIF.  Pt with h/o CVA with signficant R sided weakness.    PT Comments    Pt agrees to session.  Participated in exercises as described below.  To edge of bed with min assist and increased time.  Once sitting, able to sit with min guard.  He does loose his balance when coughing to right.  Educated on having someone with his when sitting unsupported at home.  He was able to stand with min assist and RW for up to 1 minute.  During standing, some SOB was noted.  After short rest in sitting, he requested to lay back down to go to sleep.  Refused transfer to recliner stating he was awaiting discharge (discharge orders in).  Assisted to supine with min assist to reposition in bed.  He stated he has a trapeze at home.  O2 checked.  Noted to be 88% on room air.  HR 128.  After a few moments O2 increased to 91% and HR down to 117.  Primary nurse notified.  Pt left on room air.   Follow Up Recommendations  Home health PT;Supervision/Assistance - 24 hour     Equipment Recommendations  Rolling walker with 5" wheels    Recommendations for Other Services       Precautions / Restrictions Precautions Precautions: Fall Restrictions Weight Bearing Restrictions: Yes RLE Weight Bearing: Weight bearing as tolerated LLE Weight Bearing: Weight bearing as tolerated    Mobility  Bed Mobility Overal bed mobility: Needs Assistance Bed Mobility: Supine to Sit;Sit to Supine     Supine to sit: Min assist Sit to supine: Min assist      Transfers Overall transfer level: Needs assistance Equipment used: Rolling walker (2 wheeled) Transfers: Sit to/from Stand Sit to Stand: Min assist;Mod assist            Ambulation/Gait             General Gait Details: refused OOB to recliner -  awaiting discharge   Stairs            Wheelchair Mobility    Modified Rankin (Stroke Patients Only)       Balance Overall balance assessment: Needs assistance Sitting-balance support: Feet supported Sitting balance-Leahy Scale: Fair     Standing balance support: Bilateral upper extremity supported Standing balance-Leahy Scale: Poor                              Cognition Arousal/Alertness: Awake/alert Behavior During Therapy: WFL for tasks assessed/performed Overall Cognitive Status: Within Functional Limits for tasks assessed                                        Exercises General Exercises - Lower Extremity Ankle Circles/Pumps: AAROM;10 reps;Right Quad Sets: AAROM;10 reps Gluteal Sets: Strengthening;10 reps;Right Short Arc Quad: AAROM;5 reps Long Arc Quad: AAROM;5 reps;Right Heel Slides: Strengthening;10 reps Hip ABduction/ADduction: 10 reps;Right;AROM    General Comments        Pertinent Vitals/Pain Pain Assessment: 0-10 Pain Score: 5  Pain Location: R hip Pain Descriptors / Indicators: Operative site guarding;Sore Pain Intervention(s): Limited activity within patient's tolerance    Home Living  Prior Function            PT Goals (current goals can now be found in the care plan section) Progress towards PT goals: Progressing toward goals    Frequency    BID      PT Plan Current plan remains appropriate    Co-evaluation              AM-PAC PT "6 Clicks" Daily Activity  Outcome Measure  Difficulty turning over in bed (including adjusting bedclothes, sheets and blankets)?: Unable Difficulty moving from lying on back to sitting on the side of the bed? : Unable Difficulty sitting down on and standing up from a chair with arms (e.g., wheelchair, bedside commode, etc,.)?: Unable Help needed moving to and from a bed to chair (including a wheelchair)?: A Lot Help needed walking  in hospital room?: Total Help needed climbing 3-5 steps with a railing? : Total 6 Click Score: 7    End of Session Equipment Utilized During Treatment: Gait belt Activity Tolerance: Patient limited by fatigue;Patient tolerated treatment well Patient left: in bed;with bed alarm set;with call bell/phone within reach Nurse Communication: Other (comment)       Time: 5956-3875 PT Time Calculation (min) (ACUTE ONLY): 17 min  Charges:  $Therapeutic Exercise: 8-22 mins                    G Codes:       Chesley Noon, PTA 09/24/17, 10:39 AM

## 2017-09-24 NOTE — Progress Notes (Addendum)
  Subjective: 3 Days Post-Op Procedure(s) (LRB): INTRAMEDULLARY (IM) NAIL INTERTROCHANTRIC (Right) Patient reports pain as mild.   Patient is well, and has had no acute complaints or problems Plan is for discharge home with HHPT. Negative for abd pain, chest pain, shortness of breath Fever: no Gastrointestinal:Negative for nausea and vomiting  Objective: Vital signs in last 24 hours: Temp:  [97.6 F (36.4 C)-99.1 F (37.3 C)] 97.6 F (36.4 C) (02/16 0300) Pulse Rate:  [102-111] 105 (02/16 0300) Resp:  [18-22] 19 (02/16 0300) BP: (126-161)/(67-76) 153/70 (02/16 0300) SpO2:  [92 %-96 %] 92 % (02/16 0300) Weight:  [52.2 kg (115 lb)] 52.2 kg (115 lb) (02/16 0303)  Intake/Output from previous day:  Intake/Output Summary (Last 24 hours) at 09/24/2017 0821 Last data filed at 09/24/2017 0400 Gross per 24 hour  Intake 2735 ml  Output 3555 ml  Net -820 ml    Intake/Output this shift: No intake/output data recorded.  Labs: Recent Labs    09/22/17 0504 09/22/17 0830 09/23/17 0608 09/23/17 1436 09/24/17 0421  HGB 7.4* 7.4* 7.2* 8.6* 8.1*   Recent Labs    09/23/17 0608 09/23/17 1436 09/24/17 0421  WBC 7.8  --  6.5  RBC 2.44* 2.85* 2.68*  HCT 21.5* 25.6* 23.6*  PLT 127*  --  132*   Recent Labs    09/23/17 0608 09/24/17 0421  NA 128* 131*  K 3.7 4.1  CL 100* 100*  CO2 21* 26  BUN 9 5*  CREATININE 0.88 0.73  GLUCOSE 85 86  CALCIUM 7.9* 8.1*   No results for input(s): LABPT, INR in the last 72 hours.   EXAM General - Patient is Alert, Appropriate and Oriented Extremity - ABD soft Sensation intact distally Intact pulses distally Dorsiflexion/Plantar flexion intact Incision: scant drainage No cellulitis present  Thigh is soft with no swelling Dressing/Incision - CDI. No drainage to right hip dressings. Motor Function - intact, moving foot and toes well on exam.   Past Medical History:  Diagnosis Date  . Bipolar disorder ([place])   . COPD (chronic  obstructive pulmonary disease) ([place])    on home oxygen.   [name] GERD (gastroesophageal reflux disease)   . Hyperlipidemia   . Hypertension   . Insomnia, controlled   . Obesity   . Oxygen deficiency    2 LNC at night  . Shortness of breath   . Stroke [facility]) 2014   R sided hemiplegia  . Substance abuse ([place]) 2016   cocaine use.    Assessment/Plan: 3 Days Post-Op Procedure(s) (LRB): INTRAMEDULLARY (IM) NAIL INTERTROCHANTRIC (Right) Active Problems:   Hip fracture (HCC)   Acute post op blood loss anemia   Estimated body mass index is 17.49 kg/m as calculated from the following:   Height as of this encounter: 5\' 8"  (1.727 m).   Weight as of this encounter: 52.2 kg (115 lb). Advance diet Up with therapy WBAT Needs BM before discharge Acute post op blood loss anemia - Hgb up to 8.6 after 1 unit of PRBC. 8.1 this am Plan on discharge to home with home health PT pending medical clearance.  After discharge, continue Lovenox 40mg  injected daily for 14 days. Follow-up with [facility] in 14 days for staple removal, wound check    DVT Prophylaxis - Lovenox, Foot Pumps and TED hose Weight-Bearing as tolerated to right leg  Duanne Guess, PA-C [facility] 09/24/2017, 8:21 AM

## 2017-09-24 NOTE — Care Management Note (Signed)
Case Management Note  Patient Details  Name: David Hall. MRN: [id number] Date of Birth: 16-May-1956  Subjective/Objective:    A referral for HH=PT, RN, AIDE, OT , was given to [name] at [facility]. A referral for Palliative Care was faxed to [name] at [facility] and Palliative [facility] of [facility].                Action/Plan:   Expected Discharge Date:  09/24/17               Expected Discharge Plan:     In-House Referral:     Discharge planning Services  CM Consult, Other - See comment(palliative at home)  Post Acute Care Choice:  Home Health Choice offered to:  Patient  DME Arranged:    DME Agency:     HH Arranged:  PT, Social Work [employer] Agency:  [place]  Status of Service:  In process, will continue to follow  If discussed at Long Length of Stay Meetings, dates discussed:    Additional Comments:  [name], RN 09/24/2017, 9:30 AM

## 2017-09-24 NOTE — Progress Notes (Signed)
Report received for pt, at 1505. Awaiting EMS transport.    Pt dc home via ems.

## 2017-09-24 NOTE — Discharge Instructions (Signed)

## 2017-09-24 NOTE — Care Management Note (Signed)
Case Management Note  Patient Details  Name: David Hall. MRN: [id number] Date of Birth: August 24, 1955  Subjective/Objective:                    Action/Plan:   Expected Discharge Date:  09/24/17               Expected Discharge Plan:  [place]  In-House Referral:     Discharge planning Services  CM Consult, Other - See comment(palliative at home)  Post Acute Care Choice:  Home Health Choice offered to:  Patient  DME Arranged:    DME Agency:     HH Arranged:  PT, Social Work, Therapist, sports, OT, Nurse's Aide [place] Agency:  [place]  Status of Service:  Completed, signed off  If discussed at [employer], dates discussed:    Additional Comments:  Adil Tugwell A, RN 09/24/2017, 9:35 AM

## 2017-09-24 NOTE — Plan of Care (Signed)
  Education: Knowledge of General Education information will improve 09/24/2017 0439 - Progressing by Shirleyann Montero, Lucille Passy, RN   Health Behavior/Discharge Planning: Ability to manage health-related needs will improve 09/24/2017 0439 - Progressing by Marella Vanderpol, Lucille Passy, RN   Clinical Measurements: Ability to maintain clinical measurements within normal limits will improve 09/24/2017 0439 - Progressing by Alyiah Ulloa, Lucille Passy, RN Will remain free from infection 09/24/2017 0439 - Progressing by Teneisha Gignac, Lucille Passy, RN Diagnostic test results will improve 09/24/2017 0439 - Progressing by Quitman Norberto, Lucille Passy, RN Respiratory complications will improve 09/24/2017 0439 - Progressing by Bryna Colander, RN Cardiovascular complication will be avoided 09/24/2017 0439 - Progressing by Bryna Colander, RN   Activity: Risk for activity intolerance will decrease 09/24/2017 0439 - Progressing by Bryna Colander, RN   Nutrition: Adequate nutrition will be maintained 09/24/2017 0439 - Progressing by Bryna Colander, RN   Coping: Level of anxiety will decrease 09/24/2017 0439 - Progressing by Bryna Colander, RN   Coping: Level of anxiety will decrease 09/24/2017 0439 - Progressing by Dwyne Hasegawa, Lucille Passy, RN

## 2017-09-26 NOTE — Telephone Encounter (Addendum)
Additional update, reviewed prior hospitalization with hip fracture. Discharged over weekend 09/24/17.  I do not see any record of [place] orders for PT or RN arranged as of now, and speaking with family member primary caregiver today she reports that they do not have any arrangements, all they have still is someone for PCS through Medicaid that is helping with housework only.  He was receiving care from [facility] through December 2018.  I have offered hospice evaluation in past and this was declined, due to his severe COPD he was candidate. Now concern with hip fracture I do think that he is still candidate for palliative care and/or hospice, but he is very resistant to this but family agrees to have this consult, as far as I know order was placed last week on Friday and this is still pending.  Now patient is in need of [place] PT and RN after hip fracture, and needs RN for lovenox injections.  They were unaware of any [place] agency helping at this time.  I called [facility] and they do not have any active care open for this patient. I asked if we could resume care and they request fax order information. I will send this to them promptly today 09/26/17 to start care as soon as possible.  Order to be faxed to [facility] at [phone].  [name], [facility] 09/26/2017, 3:01 PM

## 2017-09-26 NOTE — Addendum Note (Signed)
Addended by: Olin Hauser on: 09/26/2017 03:07 PM   Modules accepted: Orders

## 2017-09-29 NOTE — Discharge Summary (Signed)
Olsen Mccutchan., is a 62 y.o. male  DOB [birth year]  MRN [id number].  Admission date:  09/20/2017  Admitting Physician  Harrie Foreman, MD  Discharge Date:  09/24/2017   Primary MD  Olin Hauser, DO  Recommendations for primary care physician for things to follow:  Follow with PCP in 1 week   Admission Diagnosis  Hyponatremia [E87.1] Hip fx ([place]) [S72.009A] Closed fracture of right hip, initial encounter ([place]) [S72.001A]   Discharge Diagnosis  Hyponatremia [E87.1] Hip fx ([place]) [S72.009A] Closed fracture of right hip, initial encounter ([place]) [S72.001A]    Active Problems:   Hip fracture [facility])      Past Medical History:  Diagnosis Date  . Bipolar disorder ([place])   . COPD (chronic obstructive pulmonary disease) ([place])    on home oxygen.   [name] GERD (gastroesophageal reflux disease)   . Hyperlipidemia   . Hypertension   . Insomnia, controlled   . Obesity   . Oxygen deficiency    2 LNC at night  . Shortness of breath   . Stroke [facility]) 2014   R sided hemiplegia  . Substance abuse ([place]) 2016   cocaine use.    Past Surgical History:  Procedure Laterality Date  . CARPAL TUNNEL RELEASE    . INTRAMEDULLARY (IM) NAIL INTERTROCHANTERIC Right 09/21/2017   Procedure: INTRAMEDULLARY (IM) NAIL INTERTROCHANTRIC;  Surgeon: Corky Mull, MD;  Location: [facility] ORS;  Service: Orthopedics;  Laterality: Right;  . TONSILLECTOMY         History of present illness and  Hospital Course:     Kindly see H&P for history of present illness and admission details, please review complete Labs, Consult reports and Test reports for all details in brief  HPI  from the history and physical done on the day of admission 62 year old male patient admitted for fall, found to have right hip fracture.  Hospital Course  #1  .acute right hip fracture: Status post surgery intramedullary nail.  By orthopedic Dr. Roland Rack on February 13.  Postoperatively patient pain controlled well, discharged home with home health physical therapy, DVT prophylaxis with Lovenox 2 weeks.  Patient can follow-up with [facility] in 2 weeks for staple removal per and wound check.  #2. COPD: stable.  Continue home dose inhalers. 3.  See bipolar disorder: Patient is on Cymbalta. 4.  Hyponatremia, hypokalemia improved with fluids. 5.  Hypotension improved with blood transfusion.  Discontinue metoprolol, Imdur at home. History of previous stroke, wheelchair-bound anyway, so discharged home with home health physical therapy. #6 postop anemia: CT abdomen negative for hematoma.  Patient received 2 units of packed RBC in the hospital.  Discharge Condition: Stable   Follow UP  Follow-up Information    Lattie Corns, PA-C. Call in 14 day(s).   Specialty:  Physician Assistant Why:  Electa Sniff information: [place] [id number] [phone]             Discharge Instructions  and  Discharge Medications      Allergies as of 09/24/2017      Reactions   Quetiapine Other (See Comments)   "feels like someone is sticking pins in my legs." "makes me feel like someone is sticking needles and knives in my legs"   Seroquel [quetiapine Fumarate] Other (See Comments)   "feels like someone is sticking pins in my legs."   Codeine Itching, Rash      Medication List    STOP taking  these medications   amLODIPine-Valsartan-HCTZ 10-160-12.5 MG Tabs   aspirin 325 MG EC tablet   isosorbide mononitrate 30 MG 24 hr tablet Commonly known as:  IMDUR   metoprolol tartrate 50 MG tablet Commonly known as:  LOPRESSOR     TAKE these medications   atorvastatin 40 MG tablet Commonly known as:  LIPITOR TAKE 1 TABLET BY MOUTH DAILY.   baclofen 10 MG tablet Commonly known as:   LIORESAL TAKE 1/2-1 TABLET BY MOUTH THREE TIMES DAILY AS NEEDED FOR MUSCLE SPASMS   bisacodyl 10 MG suppository Commonly known as:  DULCOLAX Place 1 suppository (10 mg total) rectally daily as needed for moderate constipation.   divalproex 500 MG DR tablet Commonly known as:  DEPAKOTE Take 1 tablet (500 mg total) by mouth 2 (two) times daily. What changed:  medication strength   DULoxetine 30 MG capsule Commonly known as:  CYMBALTA Take 90 mg by mouth daily.   enoxaparin 40 MG/0.4ML injection Commonly known as:  LOVENOX Inject 0.4 mLs (40 mg total) into the skin daily. Take lovenox 40 mg sq daily for 2 weeks to prevent blood clots   gabapentin 600 MG tablet Commonly known as:  NEURONTIN TAKE 1 TABLET BY MOUTH TWICE DAILY, IF NEEDED MAY TAKE AN ADDITIONAL DOSE IN THE AFTERNOON.   levofloxacin 500 MG tablet Commonly known as:  LEVAQUIN Take 1 tablet (500 mg total) by mouth daily for 7 days. For 7 days   mupirocin ointment 2 % Commonly known as:  BACTROBAN Place 1 application into the nose 2 (two) times daily.   omeprazole 20 MG capsule Commonly known as:  PRILOSEC TAKE 1 CAPSULE BY MOUTH TWICE DAILY   oxyCODONE 5 MG immediate release tablet Commonly known as:  Oxy IR/ROXICODONE Take 1 tablet (5 mg total) by mouth every 3 (three) hours as needed for moderate pain ((score 4 to 6)).   potassium chloride SA 20 MEQ tablet Commonly known as:  K-DUR,KLOR-CON Take 1 tablet (20 mEq total) by mouth daily.   PROAIR HFA 108 (90 Base) MCG/ACT inhaler Generic drug:  albuterol INHALE 2 PUFFS BY MOUTH INTO THE LUNGS FOUR TIMES DAILY AS NEEDED What changed:  Another medication with the same name was changed. Make sure you understand how and when to take each.   albuterol (2.5 MG/3ML) 0.083% nebulizer solution Commonly known as:  PROVENTIL Take 3 mLs (2.5 mg total) by nebulization every 6 (six) hours as needed for wheezing or shortness of breath. What changed:  See the new  instructions.   REXULTI 2 MG Tabs Generic drug:  Brexpiprazole Take 2 mg by mouth at bedtime.   trazodone 300 MG tablet Commonly known as:  DESYREL Take 300 mg by mouth at bedtime. Reported on 10/27/2015         Diet and Activity recommendation: See Discharge Instructions above   Consults obtained -orthopedic, physical therapy   Major procedures and orthopedics, physical therapy reports - PLEASE review detailed and final reports for all details, in brief -      Ct Abdomen Pelvis Wo Contrast  Result Date: 09/23/2017 CLINICAL DATA:  Right flank pain. Recent fall. Chronic anemia. Recent femoral nail placement on the right EXAM: CT ABDOMEN AND PELVIS WITHOUT CONTRAST TECHNIQUE: Multidetector CT imaging of the abdomen and pelvis was performed following the standard protocol without IV contrast. Oral contrast was administered. COMPARISON:  None. FINDINGS: Lower chest: There is a small left pleural effusion with consolidation in the posterior left base. There is atelectatic change in  the posterior right base. There is an area of opacity along the inferior aspect of the lateral segment of the right lower lobe measuring 2.2 x 1.3 cm. There is a small hiatal hernia. Visualized esophagus shows wall thickening. Hepatobiliary: No focal liver lesions are evident on this noncontrast enhanced study. Gallbladder wall is not appreciably thickened. There is no biliary duct dilatation. Pancreas: No pancreatic mass or inflammatory focus. Spleen: No splenic lesions are evident. Adrenals/Urinary Tract: Adrenals bilaterally appear unremarkable. There is moderate perinephric stranding bilaterally. There is a 1.2 x 1.2 cm cyst in the upper pole right kidney. There is an 8 x 6 mm cyst along the periphery of the right kidney midportion. There is no appreciable hydronephrosis on either side. There is no renal or ureteral calculus on either side. Urinary bladder is midline with wall thickness within normal limits.  Stomach/Bowel: There is fairly diffuse stool throughout the colon. There is no appreciable bowel wall or mesenteric thickening. There is no evident bowel obstruction. No free air or portal venous air is evident. Vascular/Lymphatic: There is atherosclerotic calcification in the aorta and iliac arteries. No evident aneurysm. There is no adenopathy appreciable in the abdomen or pelvis. Reproductive: Prostate and seminal vesicles appear normal in size and contour. No evident pelvic mass. Other: Appendix appears normal. No abscess or ascites evident in the abdomen or pelvis. Musculoskeletal: The patient has had recent screw and rod fixation for a comminuted intertrochanteric femur fracture on the right. Alignment at the fracture site is near anatomic. There is soft tissue air and edema in the right proximal thigh consistent with recent surgery. There is edema and fluid in the of lateral gluteal muscles on the right without frank abscess. No fracture is seen beyond the recently fixated fracture in the proximal right femur. No blastic or lytic bone lesions are identified. Elsewhere, there is no intramuscular or abdominal wall lesion. IMPRESSION: 1. Small pleural effusions bilaterally with patchy consolidation in the posterior left base. There is a 2.2 x 1.3 cm opacity in the lateral segment of the right lower lobe. Question focal consolidation versus mass with mild surrounding pneumonitis. This lesion warrants additional surveillance. It may be prudent to chest CT in 4-6 weeks after treatment for pneumonia to assess for residua. If this area were to persist after treatment, suspicion for neoplasm would be heightened. PET-CT at that time would be reasonable if this lesion were to persist after treatment for pneumonia. 2. Postoperative change in the right thigh region with screw and nail fixation for fracture of the proximal right femur as described. There is edema and fluid in the gluteal muscles laterally on the right  without well-defined abscess. There is soft tissue air in this area is consistent with recent surgery. 3. Moderate perinephric stranding bilaterally without hydronephrosis. No renal or ureteral calculus. This perinephric stranding could be secondary to recent trauma. This finding may be seen as associated with pyelonephritis. Appropriate evaluation with respect to potential pyelonephritis advised. There is no renal mass or evidence of lobar nephronia on this noncontrast enhanced study on either side. 4. No bowel wall thickening or bowel obstruction. No abscess. Appendix appears normal. 5.  There is aortoiliac atherosclerosis. Aortic Atherosclerosis (ICD10-I70.0). Electronically Signed   By: Lowella Grip III M.D.   On: 09/23/2017 11:06   Ct Head Wo Contrast  Result Date: 09/21/2017 CLINICAL DATA:  Head and neck trauma after fall. Patient hit posterior head and neck during the fall. EXAM: CT HEAD WITHOUT CONTRAST CT CERVICAL SPINE WITHOUT  CONTRAST TECHNIQUE: Multidetector CT imaging of the head and cervical spine was performed following the standard protocol without intravenous contrast. Multiplanar CT image reconstructions of the cervical spine were also generated. COMPARISON:  06/01/2017 FINDINGS: CT HEAD FINDINGS Brain: Mild superficial atrophy. Chronic small vessel ischemic disease and left basal ganglial lacunar infarcts are redemonstrated. No acute intracranial hemorrhage, large vascular territory infarct, edema or shift. No intra-axial mass nor extra-axial fluid collections. Vascular: Mild atherosclerosis of the carotid siphons. No hyperdense vessels. Skull: No skull fracture or significant scalp soft tissue swelling. Sinuses/Orbits: No acute finding. Other: None. CT CERVICAL SPINE FINDINGS Alignment: Maintained cervical lordosis. Osteoarthritic joint space narrowing and spurring about the atlantodental interval. Minimal anterolisthesis of C4 on C5 likely degenerative in etiology. Skull base and  vertebrae: Cervical vertebral bodies and posterior elements appear intact. Moderate C6-7 disc space flattening with endplate spurring compatible with degenerative disc disease. Soft tissues and spinal canal: No prevertebral fluid or swelling. No visible canal hematoma. Mild calcific atherosclerosis of the carotid bifurcations. Disc levels: No significant osseous canal stenosis. Mild bilateral C6-7 neural foraminal narrowing. Bilateral C3-4, left C5-6 and bilateral C6-7 uncovertebral joint osteoarthritis with uncinate spurs. Upper chest: Negative. Other: None IMPRESSION: 1. No acute intracranial abnormality. Mild chronic small vessel ischemic disease and old left basal ganglial lacunar infarct. 2. No acute cervical spine fracture. Moderate disc flattening C6-7 with associated neural foraminal encroachment Electronically Signed   By: Ashley Royalty M.D.   On: 09/21/2017 00:48   Ct Cervical Spine Wo Contrast  Result Date: 09/21/2017 CLINICAL DATA:  Head and neck trauma after fall. Patient hit posterior head and neck during the fall. EXAM: CT HEAD WITHOUT CONTRAST CT CERVICAL SPINE WITHOUT CONTRAST TECHNIQUE: Multidetector CT imaging of the head and cervical spine was performed following the standard protocol without intravenous contrast. Multiplanar CT image reconstructions of the cervical spine were also generated. COMPARISON:  06/01/2017 FINDINGS: CT HEAD FINDINGS Brain: Mild superficial atrophy. Chronic small vessel ischemic disease and left basal ganglial lacunar infarcts are redemonstrated. No acute intracranial hemorrhage, large vascular territory infarct, edema or shift. No intra-axial mass nor extra-axial fluid collections. Vascular: Mild atherosclerosis of the carotid siphons. No hyperdense vessels. Skull: No skull fracture or significant scalp soft tissue swelling. Sinuses/Orbits: No acute finding. Other: None. CT CERVICAL SPINE FINDINGS Alignment: Maintained cervical lordosis. Osteoarthritic joint space  narrowing and spurring about the atlantodental interval. Minimal anterolisthesis of C4 on C5 likely degenerative in etiology. Skull base and vertebrae: Cervical vertebral bodies and posterior elements appear intact. Moderate C6-7 disc space flattening with endplate spurring compatible with degenerative disc disease. Soft tissues and spinal canal: No prevertebral fluid or swelling. No visible canal hematoma. Mild calcific atherosclerosis of the carotid bifurcations. Disc levels: No significant osseous canal stenosis. Mild bilateral C6-7 neural foraminal narrowing. Bilateral C3-4, left C5-6 and bilateral C6-7 uncovertebral joint osteoarthritis with uncinate spurs. Upper chest: Negative. Other: None IMPRESSION: 1. No acute intracranial abnormality. Mild chronic small vessel ischemic disease and old left basal ganglial lacunar infarct. 2. No acute cervical spine fracture. Moderate disc flattening C6-7 with associated neural foraminal encroachment Electronically Signed   By: Ashley Royalty M.D.   On: 09/21/2017 00:48   Dg Chest Port 1 View  Result Date: 09/21/2017 CLINICAL DATA:  Patient lost balance and fell onto right hip. Preop right hip fracture. Daily smoker with history of COPD. EXAM: PORTABLE CHEST 1 VIEW COMPARISON:  04/06/2017 FINDINGS: Patient is rotated on the current study. Heart size is within normal limits. There is  aortic atherosclerosis. Hyperinflated lungs without pneumonic consolidation or pneumothorax. Costophrenic angles are excluded on this study. No acute osseous abnormality of the bony thorax. IMPRESSION: Clear lungs.  Aortic atherosclerosis. Electronically Signed   By: Ashley Royalty M.D.   On: 09/21/2017 01:02   Dg Hip Operative Unilat W Or W/o Pelvis Right  Result Date: 09/21/2017 CLINICAL DATA:  Internal fixation right femoral fracture EXAM: OPERATIVE RIGHT HIP (WITH PELVIS IF PERFORMED) 4 VIEWS TECHNIQUE: Fluoroscopic spot image(s) were submitted for interpretation post-operatively.  COMPARISON:  09/21/2017 FINDINGS: Internal fixation across the right intertrochanteric femoral fracture. Anatomic alignment. No hardware complicating feature. IMPRESSION: Internal fixation.  No complicating feature. Electronically Signed   By: Rolm Baptise M.D.   On: 09/21/2017 19:24   Dg Hip Unilat W Or Wo Pelvis 2-3 Views Right  Result Date: 09/21/2017 CLINICAL DATA:  Lost balance, fell tonight. History of stroke and RIGHT hemiplegia. EXAM: DG HIP (WITH OR WITHOUT PELVIS) 2-3V RIGHT COMPARISON:  None. FINDINGS: Acute RIGHT femur intertrochanteric fracture with internal rotation. Laterally directed fracture apex. No dislocation. No destructive bony lesions. Osteopenia. Mild vascular calcifications. IMPRESSION: Acute displaced RIGHT femur intertrochanteric fracture. Electronically Signed   By: Elon Alas M.D.   On: 09/21/2017 01:00    Micro Results    Recent Results (from the past 240 hour(s))  Surgical PCR screen     Status: Abnormal   Collection Time: 09/21/17  4:29 AM  Result Value Ref Range Status   MRSA, PCR NEGATIVE NEGATIVE Final   Staphylococcus aureus POSITIVE (A) NEGATIVE Final    Comment: (NOTE) The Xpert SA Assay (FDA approved for NASAL specimens in patients 6 years of age and older), is one component of a comprehensive surveillance program. It is not intended to diagnose infection nor to guide or monitor treatment. Performed at [facility], [address] [place], [place] [address]        Today   Subjective:   Maksymilian Mabey today has no headache,no chest abdominal pain,no new weakness tingling or numbness, feels much better wants to go home today.   Objective:   Blood pressure (!) 144/84, pulse (!) 109, temperature 98.3 F (36.8 C), temperature source Oral, resp. rate 20, height 5\' 8"  (1.727 m), weight 52.2 kg (115 lb), SpO2 94 %.  No intake or output data in the 24 hours ending 09/29/17 1107  Exam Awake Alert, Oriented x 3, No new F.N  deficits, Normal affect [place].AT,PERRAL Supple Neck,No JVD, No cervical lymphadenopathy appriciated.  Symmetrical Chest wall movement, Good air movement bilaterally, CTAB RRR,No Gallops,Rubs or new Murmurs, No Parasternal Heave +ve B.Sounds, Abd Soft, Non tender, No organomegaly appriciated, No rebound -guarding or rigidity. No Cyanosis, Clubbing or edema, No new Rash  Right  hip fracture repaired, staples present. Data Review   CBC w Diff:  Lab Results  Component Value Date   WBC 6.5 09/24/2017   HGB 8.1 (L) 09/24/2017   HGB 12.7 (L) 09/01/2014   HCT 23.6 (L) 09/24/2017   HCT 37.6 (L) 09/01/2014   PLT 132 (L) 09/24/2017   PLT 241 09/01/2014   LYMPHOPCT 8.1 11/21/2013   MONOPCT 3.7 11/21/2013   EOSPCT 0.0 11/21/2013   BASOPCT 0.2 11/21/2013    CMP:  Lab Results  Component Value Date   NA 131 (L) 09/24/2017   NA 132 (A) 02/22/2017   NA 136 09/04/2014   K 4.1 09/24/2017   K 3.5 09/04/2014   CL 100 (L) 09/24/2017   CL 93 (A) 02/21/2017  CO2 26 09/24/2017   CO2 29 02/21/2017   BUN 5 (L) 09/24/2017   BUN 12 02/22/2017   BUN 21 (H) 09/04/2014   CREATININE 0.73 09/24/2017   CREATININE 1.26 09/04/2014   GLU 61 02/22/2017   PROT 6.6 09/20/2017   PROT 6.5 12/18/2015   PROT 7.5 09/01/2014   ALBUMIN 3.4 (L) 09/20/2017   ALBUMIN 3.6 12/18/2015   ALBUMIN 3.9 09/01/2014   BILITOT 0.5 09/20/2017   BILITOT <0.2 12/18/2015   BILITOT 0.5 09/01/2014   ALKPHOS 89 09/20/2017   ALKPHOS 55 02/21/2017   AST 17 09/20/2017   AST 27 09/01/2014   ALT 9 (L) 09/20/2017   ALT 23 09/01/2014  .   Total Time in preparing paper work, data evaluation and todays exam - 35 minutes  Epifanio Lesches M.D on 09/24/2017 at 11:07 AM    Note: This dictation was prepared with Dragon dictation along with smaller phrase technology. Any transcriptional errors that result from this process are unintentional.

## 2017-09-30 ENCOUNTER — Telehealth: Payer: Self-pay | Admitting: Family Medicine

## 2017-09-30 NOTE — Telephone Encounter (Signed)
Pankti with [facility] said pt's caregiver reported multiple falls but she found no physical evidence.  She asked for a call back so they could get a Education officer, museum out to do a depression screening and also see if the can find a way to get pt to dr's appt next week.  Her call back number is [phone]

## 2017-09-30 NOTE — Telephone Encounter (Signed)
Spoke to [employer], A PT from [facility], patient shows some improve for willing to walk around but none last visit. She was not sure if patient is depressed and concerned about his appointment with [facility] next week for staple removal that he might fell since caregiver mentioned couple of falls but no physical evidence. Advised her that she can call [facility] and asked to get wheel chair for patient. She also wanted verbal for social worker involved.

## 2017-10-01 ENCOUNTER — Other Ambulatory Visit: Payer: Self-pay | Admitting: Family Medicine

## 2017-10-01 DIAGNOSIS — J432 Centrilobular emphysema: Secondary | ICD-10-CM

## 2017-10-06 ENCOUNTER — Other Ambulatory Visit: Payer: Self-pay | Admitting: Family Medicine

## 2017-10-06 DIAGNOSIS — G890 Central pain syndrome: Secondary | ICD-10-CM

## 2017-10-11 ENCOUNTER — Inpatient Hospital Stay
Admission: EM | Admit: 2017-10-11 | Discharge: 2017-10-13 | DRG: 194 | Disposition: A | Discharge status: Skilled Nursing Facility | Payer: Medicare Other | Attending: Family Medicine | Admitting: Family Medicine

## 2017-10-11 ENCOUNTER — Emergency Department: Payer: Medicare Other

## 2017-10-11 ENCOUNTER — Other Ambulatory Visit: Payer: Self-pay

## 2017-10-11 DIAGNOSIS — F319 Bipolar disorder, unspecified: Secondary | ICD-10-CM | POA: Diagnosis present

## 2017-10-11 DIAGNOSIS — J189 Pneumonia, unspecified organism: Secondary | ICD-10-CM | POA: Diagnosis present

## 2017-10-11 DIAGNOSIS — I10 Essential (primary) hypertension: Secondary | ICD-10-CM | POA: Diagnosis present

## 2017-10-11 DIAGNOSIS — E785 Hyperlipidemia, unspecified: Secondary | ICD-10-CM | POA: Diagnosis present

## 2017-10-11 DIAGNOSIS — Z7982 Long term (current) use of aspirin: Secondary | ICD-10-CM | POA: Diagnosis not present

## 2017-10-11 DIAGNOSIS — Z66 Do not resuscitate: Secondary | ICD-10-CM | POA: Diagnosis present

## 2017-10-11 DIAGNOSIS — Z96641 Presence of right artificial hip joint: Secondary | ICD-10-CM | POA: Diagnosis present

## 2017-10-11 DIAGNOSIS — Z9981 Dependence on supplemental oxygen: Secondary | ICD-10-CM | POA: Diagnosis not present

## 2017-10-11 DIAGNOSIS — F17210 Nicotine dependence, cigarettes, uncomplicated: Secondary | ICD-10-CM | POA: Diagnosis present

## 2017-10-11 DIAGNOSIS — R0602 Shortness of breath: Secondary | ICD-10-CM

## 2017-10-11 DIAGNOSIS — R0902 Hypoxemia: Secondary | ICD-10-CM

## 2017-10-11 DIAGNOSIS — Z8673 Personal history of transient ischemic attack (TIA), and cerebral infarction without residual deficits: Secondary | ICD-10-CM

## 2017-10-11 DIAGNOSIS — E871 Hypo-osmolality and hyponatremia: Secondary | ICD-10-CM | POA: Diagnosis present

## 2017-10-11 DIAGNOSIS — K219 Gastro-esophageal reflux disease without esophagitis: Secondary | ICD-10-CM | POA: Diagnosis present

## 2017-10-11 DIAGNOSIS — Y95 Nosocomial condition: Secondary | ICD-10-CM | POA: Diagnosis present

## 2017-10-11 DIAGNOSIS — J441 Chronic obstructive pulmonary disease with (acute) exacerbation: Secondary | ICD-10-CM | POA: Diagnosis present

## 2017-10-11 DIAGNOSIS — J440 Chronic obstructive pulmonary disease with acute lower respiratory infection: Secondary | ICD-10-CM | POA: Diagnosis present

## 2017-10-11 LAB — CBC
HCT: 26.2 % — ABNORMAL LOW (ref 40.0–52.0)
Hemoglobin: 8.6 g/dL — ABNORMAL LOW (ref 13.0–18.0)
MCH: 28.8 pg (ref 26.0–34.0)
MCHC: 32.9 g/dL (ref 32.0–36.0)
MCV: 87.7 fL (ref 80.0–100.0)
Platelets: 91 10*3/uL — ABNORMAL LOW (ref 150–440)
RBC: 2.99 MIL/uL — ABNORMAL LOW (ref 4.40–5.90)
RDW: 14.9 % — ABNORMAL HIGH (ref 11.5–14.5)
WBC: 9.6 10*3/uL (ref 3.8–10.6)

## 2017-10-11 LAB — BLOOD GAS, VENOUS
Acid-Base Excess: 4 mmol/L — ABNORMAL HIGH (ref 0.0–2.0)
Bicarbonate: 30.8 mmol/L — ABNORMAL HIGH (ref 20.0–28.0)
Patient temperature: 37
pCO2, Ven: 57 mmHg (ref 44.0–60.0)
pH, Ven: 7.34 (ref 7.250–7.430)

## 2017-10-11 LAB — BASIC METABOLIC PANEL
Anion gap: 8 (ref 5–15)
BUN: 7 mg/dL (ref 6–20)
CO2: 26 mmol/L (ref 22–32)
Calcium: 8.2 mg/dL — ABNORMAL LOW (ref 8.9–10.3)
Chloride: 91 mmol/L — ABNORMAL LOW (ref 101–111)
Creatinine, Ser: 0.78 mg/dL (ref 0.61–1.24)
GFR calc Af Amer: >60 mL/min (ref >60)
GFR calc non Af Amer: >60 mL/min (ref >60)
Glucose, Bld: 96 mg/dL (ref 65–99)
Potassium: 4.8 mmol/L (ref 3.5–5.1)
Sodium: 125 mmol/L — ABNORMAL LOW (ref 135–145)

## 2017-10-11 LAB — BRAIN NATRIURETIC PEPTIDE: B Natriuretic Peptide: 262 pg/mL — ABNORMAL HIGH (ref 0.0–100.0)

## 2017-10-11 LAB — LACTIC ACID, PLASMA: Lactic Acid, Venous: 0.8 mmol/L (ref 0.5–1.9)

## 2017-10-11 LAB — TSH: TSH: 2.833 u[IU]/mL (ref 0.350–4.500)

## 2017-10-11 LAB — TROPONIN I: Troponin I: 0.06 ng/mL (ref <0.03)

## 2017-10-11 MED ORDER — DIVALPROEX SODIUM 500 MG PO DR TAB
500.0000 mg | DELAYED_RELEASE_TABLET | Freq: Two times a day (BID) | ORAL | Status: DC
  Administered 2017-10-11 – 2017-10-13 (×5): 500 mg via ORAL
  Filled 2017-10-11 (×6): qty 1

## 2017-10-11 MED ORDER — VANCOMYCIN HCL IN DEXTROSE 1-5 GM/200ML-% IV SOLN
1000.0000 mg | Freq: Once | INTRAVENOUS | Status: AC
  Administered 2017-10-11: 1000 mg via INTRAVENOUS
  Filled 2017-10-11: qty 200

## 2017-10-11 MED ORDER — SODIUM CHLORIDE 0.9 % IV SOLN
INTRAVENOUS | Status: DC
  Administered 2017-10-11: 75 mL/h via INTRAVENOUS
  Administered 2017-10-11: 05:00:00 via INTRAVENOUS

## 2017-10-11 MED ORDER — BREXPIPRAZOLE 1 MG PO TABS
4.0000 mg | ORAL_TABLET | Freq: Every day | ORAL | Status: DC
  Administered 2017-10-11 – 2017-10-12 (×2): 4 mg via ORAL
  Filled 2017-10-11 (×3): qty 4

## 2017-10-11 MED ORDER — BREXPIPRAZOLE 2 MG PO TABS
4.0000 mg | ORAL_TABLET | Freq: Every day | ORAL | Status: DC
  Filled 2017-10-11: qty 2

## 2017-10-11 MED ORDER — ENOXAPARIN SODIUM 40 MG/0.4ML ~~LOC~~ SOLN
40.0000 mg | SUBCUTANEOUS | Status: DC
  Administered 2017-10-11 – 2017-10-12 (×2): 40 mg via SUBCUTANEOUS
  Filled 2017-10-11 (×2): qty 0.4

## 2017-10-11 MED ORDER — PANTOPRAZOLE SODIUM 40 MG PO TBEC
40.0000 mg | DELAYED_RELEASE_TABLET | Freq: Every day | ORAL | Status: DC
  Administered 2017-10-11 – 2017-10-13 (×3): 40 mg via ORAL
  Filled 2017-10-11 (×3): qty 1

## 2017-10-11 MED ORDER — POTASSIUM CHLORIDE CRYS ER 20 MEQ PO TBCR
20.0000 meq | EXTENDED_RELEASE_TABLET | Freq: Every day | ORAL | Status: DC
  Administered 2017-10-11 – 2017-10-13 (×3): 20 meq via ORAL
  Filled 2017-10-11 (×3): qty 1

## 2017-10-11 MED ORDER — ASPIRIN EC 325 MG PO TBEC
325.0000 mg | DELAYED_RELEASE_TABLET | Freq: Every day | ORAL | Status: DC
  Administered 2017-10-11 – 2017-10-13 (×3): 325 mg via ORAL
  Filled 2017-10-11 (×3): qty 1

## 2017-10-11 MED ORDER — DULOXETINE HCL 60 MG PO CPEP
90.0000 mg | ORAL_CAPSULE | Freq: Every day | ORAL | Status: DC
  Administered 2017-10-11 – 2017-10-13 (×3): 90 mg via ORAL
  Filled 2017-10-11 (×3): qty 1

## 2017-10-11 MED ORDER — ACETAMINOPHEN 325 MG PO TABS: 650.0000 mg | ORAL_TABLET | Freq: Four times a day (QID) | ORAL | Status: DC | PRN

## 2017-10-11 MED ORDER — DOCUSATE SODIUM 100 MG PO CAPS
100.0000 mg | ORAL_CAPSULE | Freq: Two times a day (BID) | ORAL | Status: DC
  Administered 2017-10-11 – 2017-10-13 (×4): 100 mg via ORAL
  Filled 2017-10-11 (×4): qty 1

## 2017-10-11 MED ORDER — ONDANSETRON HCL 4 MG/2ML IJ SOLN: 4.0000 mg | Freq: Four times a day (QID) | INTRAMUSCULAR | Status: DC | PRN

## 2017-10-11 MED ORDER — ONDANSETRON HCL 4 MG PO TABS: 4.0000 mg | ORAL_TABLET | Freq: Four times a day (QID) | ORAL | Status: DC | PRN

## 2017-10-11 MED ORDER — BISACODYL 10 MG RE SUPP: 10.0000 mg | Freq: Every day | RECTAL | Status: DC | PRN

## 2017-10-11 MED ORDER — MUPIROCIN 2 % EX OINT
1.0000 "application " | TOPICAL_OINTMENT | Freq: Two times a day (BID) | CUTANEOUS | Status: DC
  Administered 2017-10-11 – 2017-10-13 (×4): 1 via NASAL
  Filled 2017-10-11: qty 22

## 2017-10-11 MED ORDER — TRAZODONE HCL 100 MG PO TABS
300.0000 mg | ORAL_TABLET | Freq: Every day | ORAL | Status: DC
  Administered 2017-10-11 – 2017-10-12 (×2): 300 mg via ORAL
  Filled 2017-10-11 (×2): qty 3

## 2017-10-11 MED ORDER — SODIUM CHLORIDE 0.9 % IV SOLN
2.0000 g | Freq: Once | INTRAVENOUS | Status: AC
  Administered 2017-10-11: 2 g via INTRAVENOUS
  Filled 2017-10-11: qty 2

## 2017-10-11 MED ORDER — GABAPENTIN 300 MG PO CAPS
600.0000 mg | ORAL_CAPSULE | Freq: Three times a day (TID) | ORAL | Status: DC
  Administered 2017-10-11 – 2017-10-13 (×7): 600 mg via ORAL
  Filled 2017-10-11 (×7): qty 2

## 2017-10-11 MED ORDER — VANCOMYCIN HCL IN DEXTROSE 750-5 MG/150ML-% IV SOLN
750.0000 mg | Freq: Two times a day (BID) | INTRAVENOUS | Status: DC
  Administered 2017-10-11 – 2017-10-13 (×4): 750 mg via INTRAVENOUS
  Filled 2017-10-11 (×7): qty 150

## 2017-10-11 MED ORDER — ACETAMINOPHEN 650 MG RE SUPP: 650.0000 mg | Freq: Four times a day (QID) | RECTAL | Status: DC | PRN

## 2017-10-11 MED ORDER — ATORVASTATIN CALCIUM 20 MG PO TABS
40.0000 mg | ORAL_TABLET | Freq: Every day | ORAL | Status: DC
  Administered 2017-10-11 – 2017-10-13 (×3): 40 mg via ORAL
  Filled 2017-10-11 (×3): qty 2

## 2017-10-11 MED ORDER — OXYCODONE HCL 5 MG PO TABS: 5.0000 mg | ORAL_TABLET | Freq: Four times a day (QID) | ORAL | Status: DC | PRN

## 2017-10-11 MED ORDER — BACLOFEN 10 MG PO TABS
10.0000 mg | ORAL_TABLET | Freq: Three times a day (TID) | ORAL | Status: DC | PRN
  Filled 2017-10-11: qty 1

## 2017-10-11 MED ORDER — ALBUTEROL SULFATE (2.5 MG/3ML) 0.083% IN NEBU
2.5000 mg | INHALATION_SOLUTION | Freq: Four times a day (QID) | RESPIRATORY_TRACT | Status: DC | PRN
Start: 2017-10-11 — End: 2017-10-13

## 2017-10-11 MED ORDER — SODIUM CHLORIDE 0.9 % IV SOLN
2.0000 g | Freq: Three times a day (TID) | INTRAVENOUS | Status: DC
  Administered 2017-10-11 – 2017-10-13 (×7): 2 g via INTRAVENOUS
  Filled 2017-10-11 (×11): qty 2

## 2017-10-11 NOTE — Evaluation (Signed)
Physical Therapy Evaluation Patient Details Name: David Hall. MRN: [id number] DOB: November 03, 1955 Today's Date: 10/11/2017   History of Present Illness  Pt admitted for HCAP. Pt with recent admission for R hip fracture and is s/p ORIF. Per PT note, WBAT. Pt has been at rehab participating in therapy since surgery. Pt now with complaints of SOB symptoms. Other PMH includes COPD (on 2L of home O2), HTN, bipolar, GERD and previous R side weakness due to CVA.  Clinical Impression  Pt is a pleasant 62 year old male who was admitted for HCAP. Recent sx for R hip fracture and is s/p ORIF. Pt performs bed mobility, transfers, and ambulation with mod assist and use of RW. +2 used for safety. Pt demonstrates deficits with strength/mobility/endurance. Presents with significant R hemibody weakness from previous CVA.  Currently on 3L of O2, unable to maintain O2 sats WNL during exertion and quickly desats to high 70s/low 80%s with limited exertion. Motivated to participate in therapy. Would benefit from skilled PT to address above deficits and promote optimal return to PLOF; recommend transition to return to STR upon discharge from acute hospitalization.       Follow Up Recommendations SNF    Equipment Recommendations  None recommended by PT    Recommendations for Other Services       Precautions / Restrictions Precautions Precautions: Fall Restrictions Weight Bearing Restrictions: Yes RLE Weight Bearing: Weight bearing as tolerated LLE Weight Bearing: Weight bearing as tolerated      Mobility  Bed Mobility Overal bed mobility: Needs Assistance Bed Mobility: Supine to Sit     Supine to sit: Mod assist     General bed mobility comments: able to initate moving B LEs off bed, however needs heavy assist for trunk support. Once seated at EOB, able to sit with upright posture and supervision.  Transfers Overall transfer level: Needs assistance Equipment used: Rolling walker (2  wheeled) Transfers: Sit to/from Stand Sit to Stand: Min assist;Mod assist         General transfer comment: needs assist for foot position prior to transfer. Used +2 for safety. Once standing, able to stand with upright posture, needs assist moving B hands to RW.  Ambulation/Gait Ambulation/Gait assistance: Mod assist Ambulation Distance (Feet): 3 Feet Assistive device: Rolling walker (2 wheeled) Gait Pattern/deviations: Step-to pattern     General Gait Details: Able to take small steps towards L side using RW and decreased stance time on R LE. Tactile cues required for stepping and backwards movement towards recliner. Able to follow commands well.  Stairs            Wheelchair Mobility    Modified Rankin (Stroke Patients Only)       Balance Overall balance assessment: Needs assistance Sitting-balance support: Feet supported Sitting balance-Leahy Scale: Good     Standing balance support: Bilateral upper extremity supported Standing balance-Leahy Scale: Fair                               Pertinent Vitals/Pain Pain Assessment: No/denies pain    Home Living Family/patient expects to be discharged to:: Skilled nursing facility Living Arrangements: Non-relatives/Friends Available Help at Discharge: Available 24 hours/day   Home Access: Ramped entrance     Home Layout: One level Home Equipment: Walker - 2 wheels;Wheelchair - power;Bedside commode      Prior Function Level of Independence: Needs assistance         Comments:  Pt reports at home he was able to stand and take a few steps over to [facility] without RW. Since surgery, has been at SNF working on transfers using RW.     Hand Dominance        Extremity/Trunk Assessment   Upper Extremity Assessment Upper Extremity Assessment: Generalized weakness(R UE grossly 3/5; L UE grossly 4/5)    Lower Extremity Assessment Lower Extremity Assessment: Generalized weakness(R LE grossly 3-/5; L LE  grossly 3+/5)       Communication   Communication: No difficulties  Cognition Arousal/Alertness: Awake/alert Behavior During Therapy: Restless Overall Cognitive Status: Impaired/Different from baseline Area of Impairment: Orientation(pt believes he is at home)                                      General Comments      Exercises Other Exercises Other Exercises: supine ther-ex performed on L LE including ankle pumps, R LE quad sets, SLRs, and B hip abd/add. All ther-ex performed x 10 reps with cues to performance. Pt on 3L of O2 during all exertion ranging from 92%-77%. Pt cued for pursed lip breathing, however not consistent. RN notified. No SOB symptoms noted Other Exercises: Pt also performed rolling towards R side for brief change and hygiene. Able to roll for new brief donned. Does become SOB with rolling while supine in bed. Min assist required   Assessment/Plan    PT Assessment Patient needs continued PT services  PT Problem List Decreased strength;Decreased activity tolerance;Decreased range of motion;Decreased balance;Decreased mobility;Decreased coordination;Decreased cognition;Decreased knowledge of use of DME;Decreased safety awareness;Pain       PT Treatment Interventions Gait training;Functional mobility training;Therapeutic activities;Therapeutic exercise;DME instruction;Balance training;Neuromuscular re-education;Patient/family education    PT Goals (Current goals can be found in the Care Plan section)  Acute Rehab PT Goals Patient Stated Goal: to go back to rehab PT Goal Formulation: With patient Time For Goal Achievement: 10/25/17 Potential to Achieve Goals: Good    Frequency Min 2X/week   Barriers to discharge        Co-evaluation               AM-PAC PT "6 Clicks" Daily Activity  Outcome Measure Difficulty turning over in bed (including adjusting bedclothes, sheets and blankets)?: Unable Difficulty moving from lying on back to  sitting on the side of the bed? : Unable Difficulty sitting down on and standing up from a chair with arms (e.g., wheelchair, bedside commode, etc,.)?: Unable Help needed moving to and from a bed to chair (including a wheelchair)?: A Lot Help needed walking in hospital room?: Total Help needed climbing 3-5 steps with a railing? : Total 6 Click Score: 7    End of Session Equipment Utilized During Treatment: Gait belt;Oxygen Activity Tolerance: Patient tolerated treatment well Patient left: in chair;with chair alarm set;with SCD's reapplied Nurse Communication: Mobility status PT Visit Diagnosis: Muscle weakness (generalized) (M62.81);Difficulty in walking, not elsewhere classified (R26.2)    Time: 4401-0272 PT Time Calculation (min) (ACUTE ONLY): 38 min   Charges:   PT Evaluation $PT Eval Moderate Complexity: 1 Mod PT Treatments $Therapeutic Exercise: 8-22 mins $Therapeutic Activity: 8-22 mins   PT G Codes:        Greggory Stallion, PT, DPT [phone]   Bently Wyss 10/11/2017, 11:34 AM

## 2017-10-11 NOTE — Progress Notes (Signed)
Pharmacy Antibiotic Note  David Hall. is a 62 y.o. male admitted on 10/11/2017 with pneumonia.  Pharmacy has been consulted for vancomycin and cefepime dosing.  Plan: DW 52kg  Vd 36L kei 0.064 hr-1  T1/2 11 hours Vancomycin 750 mg q 12 hours ordered with stacked dosing. Level before 5th dose. Goal trough 15-20.  Cefepime 2 grams q 8 hours ordered.3  MRSA screen ordered per protocol  Height: 5\' 8"  (172.7 cm) Weight: 127 lb 3.2 oz (57.7 kg) IBW/kg (Calculated) : 68.4  Temp (24hrs), Avg:98.4 F (36.9 C), Min:97.7 F (36.5 C), Max:99.1 F (37.3 C)  Recent Labs  Lab 10/11/17 0020  WBC 9.6  CREATININE 0.78  LATICACIDVEN 0.8    Estimated Creatinine Clearance: 79.1 mL/min (by C-G formula based on SCr of 0.78 mg/dL).    Allergies  Allergen Reactions  . Quetiapine Other (See Comments)    "feels like someone is sticking pins in my legs." "makes me feel like someone is sticking needles and knives in my legs"  . Seroquel [Quetiapine Fumarate] Other (See Comments)    "feels like someone is sticking pins in my legs."  . Codeine Itching and Rash    Antimicrobials this admission: Vancomycin, cefepime 3/5  >>    >>   Dose adjustments this admission:   Microbiology results: 3/5 BCx: pending 3/5 MRSA: pending      3/4 CXR: Patchy foci of bilateral airspace disease   Thank you for allowing pharmacy to be a part of this patient's care.  Eustacio Ellen S 10/11/2017 7:00 AM

## 2017-10-11 NOTE — ED Triage Notes (Signed)
Pt brought in by [facility] from [facility] s/p femur fx receiving rehab.  Pt having wet, gurgling cough that started approx 2 hours ago.  Pt dusky and on 2LNC chronic.  Per EMS, pt was in the 80s O2 sat when arrived, bumped pt to [facility] and up to 100%.  Pt coughing up mucus at this time.

## 2017-10-11 NOTE — NC FL2 (Signed)
[place] LEVEL OF CARE SCREENING TOOL     IDENTIFICATION  Patient Name: David Hall. Birthdate: 24-Jan-1956 Sex: male Admission Date (Current Location): 10/11/2017  [place] and [place] Number:  Engineering geologist and Address:   [facility], [address], [place], [place] [address]      Provider Number: [id number]  Attending Physician Name and Address:  Gorden Harms, MD  Relative Name and Phone Number:       Current Level of Care: Hospital Recommended Level of Care: [place] Prior Approval Number:    Date Approved/Denied:   PASRR Number: [id number] expires on 10/22/2017)  Discharge Plan: SNF    Current Diagnoses: Patient Active Problem List   Diagnosis Date Noted  . HCAP (healthcare-associated pneumonia) 10/11/2017  . Hip fracture ([place]) 09/21/2017  . Hypokalemia 04/19/2017  . GERD (gastroesophageal reflux disease) 12/10/2015  . 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 Right 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) ([place]) 10/27/2015  . Abnormal MRI of head 10/27/2015  . Memory loss 07/25/2015  . Faintness 07/25/2015  . On home oxygen therapy 04/02/2015  . Bipolar 1 disorder, depressed ([place]) 01/27/2015  . Pulmonary nodule 01/27/2015  . Blood glucose elevated 01/27/2015  . Restless leg 01/27/2015  . Compulsive tobacco user syndrome 01/27/2015  . Arthritis of knee, degenerative 01/27/2015  . Benign hypertension with CKD (chronic kidney disease), stage II 01/27/2015  . Cocaine abuse ([place]) 01/27/2015  . COPD (chronic obstructive pulmonary disease) with emphysema ([place]) 01/27/2015  . Chronic respiratory failure ([place]) 11/12/2014  . Recurrent falls 05/29/2014  . Cervical pain 05/29/2014  . Bursitis of elbow 05/29/2014  . Elbow pain 05/29/2014  . Dislocated shoulder 05/21/2014  . Lung mass 05/21/2014  . Dyslipidemia 10/11/2013  . History of CVA with  residual deficit 09/28/2013    Orientation RESPIRATION BLADDER Height & Weight     Self, Time, Situation, Place  O2(3 Liters Oxygen. ) Continent Weight: 127 lb 3.2 oz (57.7 kg) Height:  5\' 8"  (172.7 cm)  BEHAVIORAL SYMPTOMS/MOOD NEUROLOGICAL BOWEL NUTRITION STATUS      Continent Diet(Diet: Heart Healthy )  AMBULATORY STATUS COMMUNICATION OF NEEDS Skin   Extensive Assist Verbally Surgical wounds                       Personal Care Assistance Level of Assistance  Bathing, Feeding, Dressing Bathing Assistance: Limited assistance Feeding assistance: Independent Dressing Assistance: Limited assistance     Functional Limitations Info  Sight, Hearing, Speech Sight Info: Adequate Hearing Info: Adequate Speech Info: Adequate    SPECIAL CARE FACTORS FREQUENCY  PT (By licensed PT), OT (By licensed OT)     PT Frequency: (5) OT Frequency: (5)            Contractures      Additional Factors Info  Code Status, Allergies Code Status Info: (DNR ) Allergies Info: (Quetiapine, Seroquel Quetiapine Fumarate, Codeine)           Current Medications (10/11/2017):  This is the current hospital active medication list Current Facility-Administered Medications  Medication Dose Route Frequency Provider Last Rate Last Dose  . 0.9 %  sodium chloride infusion   Intravenous Continuous Harrie Foreman, MD 75 mL/hr at 10/11/17 0456    . acetaminophen (TYLENOL) tablet 650 mg  650 mg Oral Q6H PRN Harrie Foreman, MD       Or  . acetaminophen (TYLENOL) suppository 650 mg  650  mg Rectal Q6H PRN Harrie Foreman, MD      . albuterol (PROVENTIL) (2.5 MG/3ML) 0.083% nebulizer solution 2.5 mg  2.5 mg Nebulization Q6H PRN Harrie Foreman, MD      . aspirin EC tablet 325 mg  325 mg Oral Daily Harrie Foreman, MD      . atorvastatin (LIPITOR) tablet 40 mg  40 mg Oral Daily Harrie Foreman, MD      . baclofen (LIORESAL) tablet 10 mg  10 mg Oral TID PRN Harrie Foreman, MD      .  bisacodyl (DULCOLAX) suppository 10 mg  10 mg Rectal Daily PRN Harrie Foreman, MD      . Brexpiprazole TABS 4 mg  4 mg Oral QHS Harrie Foreman, MD      . ceFEPIme (MAXIPIME) 2 g in sodium chloride 0.9 % 100 mL IVPB  2 g Intravenous Q8H Loney Hering, MD      . divalproex (DEPAKOTE) DR tablet 500 mg  500 mg Oral BID Harrie Foreman, MD      . docusate sodium (COLACE) capsule 100 mg  100 mg Oral BID Harrie Foreman, MD      . DULoxetine (CYMBALTA) DR capsule 90 mg  90 mg Oral Daily Harrie Foreman, MD      . enoxaparin (LOVENOX) injection 40 mg  40 mg Subcutaneous Q24H Harrie Foreman, MD      . gabapentin (NEURONTIN) capsule 600 mg  600 mg Oral TID Harrie Foreman, MD      . mupirocin ointment (BACTROBAN) 2 % 1 application  1 application Nasal BID Harrie Foreman, MD      . ondansetron [facility]) tablet 4 mg  4 mg Oral Q6H PRN Harrie Foreman, MD       Or  . ondansetron [facility]) injection 4 mg  4 mg Intravenous Q6H PRN Harrie Foreman, MD      . oxyCODONE (Oxy IR/ROXICODONE) immediate release tablet 5 mg  5 mg Oral Q6H PRN Harrie Foreman, MD      . pantoprazole (PROTONIX) EC tablet 40 mg  40 mg Oral Daily Harrie Foreman, MD      . potassium chloride SA (K-DUR,KLOR-CON) CR tablet 20 mEq  20 mEq Oral Daily Harrie Foreman, MD      . traZODone (DESYREL) tablet 300 mg  300 mg Oral QHS Harrie Foreman, MD      . vancomycin (VANCOCIN) IVPB 1000 mg/200 mL premix  1,000 mg Intravenous Once Loney Hering, MD      . vancomycin (VANCOCIN) IVPB 750 mg/150 ml premix  750 mg Intravenous Q12H Loney Hering, MD         Discharge Medications: Please see discharge summary for a list of discharge medications.  Relevant Imaging Results:  Relevant Lab Results:   Additional Information (SSN: [id number])  Jancie Kercher, Veronia Beets, LCSW

## 2017-10-11 NOTE — H&P (Signed)
David Hall. is an 62 y.o. male.    Chief Complaint: Shortness of breath HPI: The patient with past medical history of COPD, hypertension hyperlipidemia presents to the emergency department with shortness of breath.  The patient states that he began to feel very weak yesterday and became progressively more dyspneic.  He does not know if he had a fever but he admits to having a wet cough that is occasionally productive of thick white phlegm.  Chest x-ray revealed multifocal pneumonia.  The patient was given broad-spectrum antibiotics in the emergency department prior to the hospitalist service being called for further management.  Past Medical History:  Diagnosis Date  . Bipolar disorder ([place])   . COPD (chronic obstructive pulmonary disease) ([place])    on home oxygen.   [name] GERD (gastroesophageal reflux disease)   . Hyperlipidemia   . Hypertension   . Insomnia, controlled   . Obesity   . Oxygen deficiency    2 LNC at night  . Shortness of breath   . Stroke [facility]) 2014   R sided hemiplegia  . Substance abuse ([place]) 2016   cocaine use.    Past Surgical History:  Procedure Laterality Date  . CARPAL TUNNEL RELEASE    . INTRAMEDULLARY (IM) NAIL INTERTROCHANTERIC Right 09/21/2017   Procedure: INTRAMEDULLARY (IM) NAIL INTERTROCHANTRIC;  Surgeon: Corky Mull, MD;  Location: [facility] ORS;  Service: Orthopedics;  Laterality: Right;  . TONSILLECTOMY      Family History  Problem Relation Age of Onset  . CAD Mother   . Hypertension Mother   . Stroke Mother   . Lung disease Father    Social History:  reports that he has been smoking cigarettes.  He has a 22.50 pack-year smoking history. He uses smokeless tobacco. He reports that he does not drink alcohol or use drugs.  Allergies:  Allergies  Allergen Reactions  . Quetiapine Other (See Comments)    "feels like someone is sticking pins in my legs." "makes me feel like someone is sticking needles and knives in my legs"  . Seroquel  [Quetiapine Fumarate] Other (See Comments)    "feels like someone is sticking pins in my legs."  . Codeine Itching and Rash    Medications Prior to Admission  Medication Sig Dispense Refill  . albuterol (PROVENTIL) (2.5 MG/3ML) 0.083% nebulizer solution Take 3 mLs (2.5 mg total) by nebulization every 6 (six) hours as needed for wheezing or shortness of breath. 75 mL 12  . aspirin EC 325 MG tablet Take 325 mg by mouth daily.    [name] atorvastatin (LIPITOR) 40 MG tablet TAKE 1 TABLET BY MOUTH DAILY. 90 tablet 0  . baclofen (LIORESAL) 10 MG tablet TAKE 1/2-1 TABLET BY MOUTH THREE TIMES DAILY AS NEEDED FOR MUSCLE SPASMS 270 tablet 1  . bisacodyl (DULCOLAX) 10 MG suppository Place 1 suppository (10 mg total) rectally daily as needed for moderate constipation. 12 suppository 0  . Brexpiprazole (REXULTI) 2 MG TABS Take 4 mg by mouth at bedtime.     . divalproex (DEPAKOTE) 500 MG DR tablet Take 1 tablet (500 mg total) by mouth 2 (two) times daily. 30 tablet 0  . DULoxetine (CYMBALTA) 30 MG capsule Take 90 mg by mouth daily.    [name] enoxaparin (LOVENOX) 40 MG/0.4ML injection Inject 0.4 mLs (40 mg total) into the skin daily. Take lovenox 40 mg sq daily for 2 weeks to prevent blood clots 14 Syringe 0  . gabapentin (NEURONTIN) 600 MG tablet TAKE 1 TABLET  BY MOUTH TWICE DAILY, IF NEEDED MAY TAKE AN ADDITIONAL DOSE IN THE AFTERNOON. (Patient taking differently: Take 1 tablet by mouth three times daily) 90 tablet 0  . omeprazole (PRILOSEC) 20 MG capsule TAKE 1 CAPSULE BY MOUTH TWICE DAILY 180 capsule 0  . oxyCODONE (OXY IR/ROXICODONE) 5 MG immediate release tablet Take 1 tablet (5 mg total) by mouth every 3 (three) hours as needed for moderate pain ((score 4 to 6)). 30 tablet 0  . potassium chloride SA (K-DUR,KLOR-CON) 20 MEQ tablet Take 1 tablet (20 mEq total) by mouth daily. 30 tablet 5  . PROAIR HFA 108 (90 Base) MCG/ACT inhaler INHALE 2 PUFFS BY MOUTH INTO THE LUNGS FOUR TIMES DAILY AS NEEDED 8.5 g 0  . trazodone  (DESYREL) 300 MG tablet Take 300 mg by mouth at bedtime. Reported on 10/27/2015    . mupirocin ointment (BACTROBAN) 2 % Place 1 application into the nose 2 (two) times daily. (Patient not taking: Reported on 10/11/2017) 22 g 0    Results for orders placed or performed during the hospital encounter of 10/11/17 (from the past 48 hour(s))  CBC     Status: Abnormal   Collection Time: 10/11/17 12:20 AM  Result Value Ref Range   WBC 9.6 3.8 - 10.6 K/uL   RBC 2.99 (L) 4.40 - 5.90 MIL/uL   Hemoglobin 8.6 (L) 13.0 - 18.0 g/dL   HCT 26.2 (L) 40.0 - 52.0 %   MCV 87.7 80.0 - 100.0 fL   MCH 28.8 26.0 - 34.0 pg   MCHC 32.9 32.0 - 36.0 g/dL   RDW 14.9 (H) 11.5 - 14.5 %   Platelets 91 (L) 150 - 440 K/uL    Comment: Performed at [facility], [address] [place], [place] [address]  Basic metabolic panel     Status: Abnormal   Collection Time: 10/11/17 12:20 AM  Result Value Ref Range   Sodium 125 (L) 135 - 145 mmol/L   Potassium 4.8 3.5 - 5.1 mmol/L   Chloride 91 (L) 101 - 111 mmol/L   CO2 26 22 - 32 mmol/L   Glucose, Bld 96 65 - 99 mg/dL   BUN 7 6 - 20 mg/dL   Creatinine, Ser 0.78 0.61 - 1.24 mg/dL   Calcium 8.2 (L) 8.9 - 10.3 mg/dL   GFR calc non Af Amer >60 >60 mL/min   GFR calc Af Amer >60 >60 mL/min    Comment: (NOTE) The eGFR has been calculated using the CKD EPI equation. This calculation has not been validated in all clinical situations. eGFR's persistently <60 mL/min signify possible Chronic Kidney Disease.    Anion gap 8 5 - 15    Comment: Performed at [facility], [address] [place], [place] [address]  Troponin I     Status: Abnormal   Collection Time: 10/11/17 12:20 AM  Result Value Ref Range   Troponin I 0.06 (HH) <0.03 ng/mL    Comment: CRITICAL RESULT CALLED TO, READ BACK BY AND VERIFIED WITH RAQUEL DAVID ON 10/11/17 AT 0138 [facility] Performed at [facility], [address] [place], [place] [address]   Brain natriuretic peptide     Status:  Abnormal   Collection Time: 10/11/17 12:20 AM  Result Value Ref Range   B Natriuretic Peptide 262.0 (H) 0.0 - 100.0 pg/mL    Comment: Performed at [facility], [place], [place] [address]  Lactic acid, plasma     Status: None   Collection Time:  10/11/17 12:20 AM  Result Value Ref Range   Lactic Acid, Venous 0.8 0.5 - 1.9 mmol/L    Comment: Performed at [facility], [address] [place], [place] [address]  Blood gas, venous     Status: Abnormal   Collection Time: 10/11/17  1:14 AM  Result Value Ref Range   pH, Ven 7.34 7.250 - 7.430   pCO2, Ven 57 44.0 - 60.0 mmHg   Bicarbonate 30.8 (H) 20.0 - 28.0 mmol/L   Acid-Base Excess 4.0 (H) 0.0 - 2.0 mmol/L   Patient temperature 37.0    Collection site VENOUS    Sample type VENOUS     Comment: Performed at [facility], [address] [place], [place] [address]   Dg Chest Port 1 View  Result Date: 10/11/2017 CLINICAL DATA:  Shortness of breath EXAM: PORTABLE CHEST 1 VIEW COMPARISON:  09/21/2017, 04/06/2017 FINDINGS: Patchy foci of airspace disease within the left upper lobe, right mid lung, and left greater than right lung bases. No pleural effusion. Normal cardiomediastinal silhouette. No pneumothorax. IMPRESSION: Patchy foci of bilateral airspace disease, may reflect multifocal infiltrates. Electronically Signed   By: Donavan Foil M.D.   On: 10/11/2017 01:15    Review of Systems  Constitutional: Negative for chills and fever.  HENT: Negative for sore throat and tinnitus.   Eyes: Negative for blurred vision and redness.  Respiratory: Positive for cough and shortness of breath.   Cardiovascular: Negative for chest pain, palpitations, orthopnea and PND.  Gastrointestinal: Negative for abdominal pain, diarrhea, nausea and vomiting.  Genitourinary: Negative for dysuria, frequency and urgency.  Musculoskeletal: Negative for joint pain and myalgias.  Skin: Negative for rash.       No lesions   Neurological: Negative for speech change, focal weakness and weakness.  Endo/Heme/Allergies: Does not bruise/bleed easily.       No temperature intolerance  Psychiatric/Behavioral: Negative for depression and suicidal ideas.    Blood pressure 117/65, pulse 82, temperature 97.7 F (36.5 C), temperature source Oral, resp. rate 19, height 5' 8"  (1.727 m), weight 57.7 kg (127 lb 3.2 oz), SpO2 99 %. Physical Exam  Vitals reviewed. Constitutional: He is oriented to person, place, and time. He appears well-developed and well-nourished. No distress.  HENT:  Head: Normocephalic and atraumatic.  Mouth/Throat: Oropharynx is clear and moist.  Eyes: Conjunctivae and EOM are normal. Pupils are equal, round, and reactive to light. No scleral icterus.  Neck: Normal range of motion. Neck supple. No JVD present. No tracheal deviation present. No thyromegaly present.  Cardiovascular: Normal rate, regular rhythm and normal heart sounds. Exam reveals no gallop and no friction rub.  No murmur heard. Respiratory: Effort normal and breath sounds normal. No respiratory distress.  GI: Soft. Bowel sounds are normal. He exhibits no distension. There is no tenderness.  Genitourinary:  Genitourinary Comments: Deferred  Musculoskeletal: Normal range of motion. He exhibits no edema.  Lymphadenopathy:    He has no cervical adenopathy.  Neurological: He is alert and oriented to person, place, and time. No cranial nerve deficit.  Skin: Skin is warm and dry. No rash noted. No erythema.  Psychiatric: He has a normal mood and affect. His behavior is normal. Judgment and thought content normal.     Assessment/Plan This is a 62 year old male admitted for pneumonia. 1.  Pneumonia: Healthcare associated.  Supplemental oxygen as needed.  Continue vancomycin and cefepime.  Currently the patient does not meet criteria for sepsis. 2.  COPD: Contributes to poor pulmonary toilet.  Continue albuterol as needed.  I will add  Spiriva to his regimen. 3.  Hyponatremia: Secondary to poor p.o. intake and/or chronic lung inflammation. 4.  Hyperlipidemia: Continue statin therapy 5.  Bipolar disorder: May contribute to poor compliance with medications.  Continue brexpiprazole, Cymbalta and Depakote.  Trazodone for sleep.   5.  DVT prophylaxis: Lovenox 6.  GI prophylaxis: Pantoprazole per home regimen The patient is a DNR.  Time spent on admission orders and patient care approximately 45 minutes  Harrie Foreman, MD 10/11/2017, 7:04 AM

## 2017-10-11 NOTE — ED Provider Notes (Signed)
[facility] Emergency Department Provider Note   ____________________________________________   First MD Initiated Contact with Patient 10/11/17 [phone]     (approximate)  I have reviewed the triage vital signs and the nursing notes.   HISTORY  Chief Complaint Shortness of Breath    HPI David Hall. is a 62 y.o. male who comes into the hospital today saying that he does not feel well.  The patient has a cough and states that he is feeling short of breath.  The patient denies any fevers or chest pain.  [facility] after having a right hip replacement a few weeks ago.  The patient is normally on 2 L of O2 but had to be increased to 4 L after having O2 sats of 85% on his 2 L.  The patient is unsure when all this started.  He states that it started today.  Patient is here for evaluation.  Past Medical History:  Diagnosis Date  . Bipolar disorder ([place])   . COPD (chronic obstructive pulmonary disease) ([place])    on home oxygen.   [name] GERD (gastroesophageal reflux disease)   . Hyperlipidemia   . Hypertension   . Insomnia, controlled   . Obesity   . Oxygen deficiency    2 LNC at night  . Shortness of breath   . Stroke [facility]) 2014   R sided hemiplegia  . Substance abuse ([place]) 2016   cocaine use.    Patient Active Problem List   Diagnosis Date Noted  . HCAP (healthcare-associated pneumonia) 10/11/2017  . Hip fracture ([place]) 09/21/2017  . Hypokalemia 04/19/2017  . GERD (gastroesophageal reflux disease) 12/10/2015  . 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 Right 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) ([place]) 10/27/2015  . Abnormal MRI of head 10/27/2015  . Memory loss 07/25/2015  . Faintness 07/25/2015  . On home oxygen therapy 04/02/2015  . Bipolar 1 disorder, depressed ([place]) 01/27/2015  . Pulmonary nodule 01/27/2015  . Blood glucose elevated 01/27/2015  . Restless leg 01/27/2015    . Compulsive tobacco user syndrome 01/27/2015  . Arthritis of knee, degenerative 01/27/2015  . Benign hypertension with CKD (chronic kidney disease), stage II 01/27/2015  . Cocaine abuse ([place]) 01/27/2015  . COPD (chronic obstructive pulmonary disease) with emphysema ([place]) 01/27/2015  . Chronic respiratory failure ([place]) 11/12/2014  . Recurrent falls 05/29/2014  . Cervical pain 05/29/2014  . Bursitis of elbow 05/29/2014  . Elbow pain 05/29/2014  . Dislocated shoulder 05/21/2014  . Lung mass 05/21/2014  . Dyslipidemia 10/11/2013  . History of CVA with residual deficit 09/28/2013    Past Surgical History:  Procedure Laterality Date  . CARPAL TUNNEL RELEASE    . INTRAMEDULLARY (IM) NAIL INTERTROCHANTERIC Right 09/21/2017   Procedure: INTRAMEDULLARY (IM) NAIL INTERTROCHANTRIC;  Surgeon: Corky Mull, MD;  Location: [facility] ORS;  Service: Orthopedics;  Laterality: Right;  . TONSILLECTOMY      Prior to Admission medications   Medication Sig Start Date End Date Taking? Authorizing Provider  albuterol (PROVENTIL) (2.5 MG/3ML) 0.083% nebulizer solution Take 3 mLs (2.5 mg total) by nebulization every 6 (six) hours as needed for wheezing or shortness of breath. 09/24/17  Yes Epifanio Lesches, MD  aspirin EC 325 MG tablet Take 325 mg by mouth daily.   Yes [provider]  atorvastatin (LIPITOR) 40 MG tablet TAKE 1 TABLET BY MOUTH DAILY. 09/06/17  Yes Karamalegos, Devonne Doughty, DO  baclofen (LIORESAL) 10 MG  tablet TAKE 1/2-1 TABLET BY MOUTH THREE TIMES DAILY AS NEEDED FOR MUSCLE SPASMS 05/02/17  Yes Karamalegos, Devonne Doughty, DO  bisacodyl (DULCOLAX) 10 MG suppository Place 1 suppository (10 mg total) rectally daily as needed for moderate constipation. 09/24/17  Yes Epifanio Lesches, MD  Brexpiprazole (REXULTI) 2 MG TABS Take 4 mg by mouth at bedtime.    Yes [provider]  divalproex (DEPAKOTE) 500 MG DR tablet Take 1 tablet (500 mg total) by mouth 2 (two) times daily. 09/24/17   Yes Epifanio Lesches, MD  DULoxetine (CYMBALTA) 30 MG capsule Take 90 mg by mouth daily.   Yes [provider]  enoxaparin (LOVENOX) 40 MG/0.4ML injection Inject 0.4 mLs (40 mg total) into the skin daily. Take lovenox 40 mg sq daily for 2 weeks to prevent blood clots 09/24/17  Yes Epifanio Lesches, MD  gabapentin (NEURONTIN) 600 MG tablet TAKE 1 TABLET BY MOUTH TWICE DAILY, IF NEEDED MAY TAKE AN ADDITIONAL DOSE IN THE AFTERNOON. Patient taking differently: Take 1 tablet by mouth three times daily 10/06/17  Yes Karamalegos, Devonne Doughty, DO  omeprazole (PRILOSEC) 20 MG capsule TAKE 1 CAPSULE BY MOUTH TWICE DAILY 09/06/17  Yes Karamalegos, Devonne Doughty, DO  oxyCODONE (OXY IR/ROXICODONE) 5 MG immediate release tablet Take 1 tablet (5 mg total) by mouth every 3 (three) hours as needed for moderate pain ((score 4 to 6)). 09/24/17  Yes Epifanio Lesches, MD  potassium chloride SA (K-DUR,KLOR-CON) 20 MEQ tablet Take 1 tablet (20 mEq total) by mouth daily. 04/19/17  Yes Karamalegos, Devonne Doughty, DO  PROAIR HFA 108 [phone] Base) MCG/ACT inhaler INHALE 2 PUFFS BY MOUTH INTO THE LUNGS FOUR TIMES DAILY AS NEEDED 10/03/17  Yes Karamalegos, Devonne Doughty, DO  trazodone (DESYREL) 300 MG tablet Take 300 mg by mouth at bedtime. Reported on 10/27/2015   Yes [provider]  mupirocin ointment (BACTROBAN) 2 % Place 1 application into the nose 2 (two) times daily. Patient not taking: Reported on 10/11/2017 09/24/17   Epifanio Lesches, MD    Allergies Quetiapine; Seroquel [quetiapine fumarate]; and Codeine  Family History  Problem Relation Age of Onset  . CAD Mother   . Hypertension Mother   . Stroke Mother   . Lung disease Father     Social History Social History   Tobacco Use  . Smoking status: Current Every Day Smoker    Packs/day: 0.50    Years: 45.00    Pack years: 22.50    Types: Cigarettes  . Smokeless tobacco: Current User  . Tobacco comment: smoking 10-15 cigs daily   Substance  Use Topics  . Alcohol use: No    Alcohol/week: 0.0 oz  . Drug use: No    Review of Systems  Constitutional: No fever/chills Eyes: No visual changes. ENT: No sore throat. Cardiovascular: Denies chest pain. Respiratory: Cough and shortness of breath. Gastrointestinal: No abdominal pain.  No nausea, no vomiting.  No diarrhea.  No constipation. Genitourinary: Negative for dysuria. Musculoskeletal: Negative for back pain. Skin: Negative for rash. Neurological: Negative for headaches, focal weakness or numbness.   ____________________________________________   PHYSICAL EXAM:  VITAL SIGNS: ED Triage Vitals  Enc Vitals Group     BP 10/11/17 0036 112/75     Pulse Rate 10/11/17 0036 97     Resp 10/11/17 0036 (!) 23     Temp 10/11/17 0036 99.1 F (37.3 C)     Temp src --      SpO2 10/11/17 0036 100 %     Weight  10/11/17 0038 115 lb (52.2 kg)     Height --      Head Circumference --      [place] --      Pain Score 10/11/17 0038 10     Pain Loc --      Pain Edu? --      Excl. in [place]? --     Constitutional: Alert and oriented. Well appearing and in moderate respiratory distress. Eyes: Conjunctivae are normal. PERRL. EOMI. Head: Atraumatic. Nose: No congestion/rhinnorhea. Mouth/Throat: Mucous membranes are moist.  Oropharynx non-erythematous. Cardiovascular: Normal rate, regular rhythm. Grossly normal heart sounds.  Good peripheral circulation. Respiratory: Increased respiratory effort.  No retractions.  Coarse and rhonchorous breath sounds. Gastrointestinal: Soft and nontender. No distention.  Positive bowel sounds. Musculoskeletal: No lower extremity tenderness nor edema.   Neurologic:  Normal speech and language.  Skin:  Skin is warm, dry and intact.  Psychiatric: Mood and affect are normal.   ____________________________________________   LABS (all labs ordered are listed, but only abnormal results are displayed)  Labs Reviewed  CBC - Abnormal; Notable for the  following components:      Result Value   RBC 2.99 (*)    Hemoglobin 8.6 (*)    HCT 26.2 (*)    RDW 14.9 (*)    Platelets 91 (*)    All other components within normal limits  BASIC METABOLIC PANEL - Abnormal; Notable for the following components:   Sodium 125 (*)    Chloride 91 (*)    Calcium 8.2 (*)    All other components within normal limits  TROPONIN I - Abnormal; Notable for the following components:   Troponin I 0.06 (*)    All other components within normal limits  BRAIN NATRIURETIC PEPTIDE - Abnormal; Notable for the following components:   B Natriuretic Peptide 262.0 (*)    All other components within normal limits  BLOOD GAS, VENOUS - Abnormal; Notable for the following components:   Bicarbonate 30.8 (*)    Acid-Base Excess 4.0 (*)    All other components within normal limits  CULTURE, BLOOD (ROUTINE X 2)  CULTURE, BLOOD (ROUTINE X 2)  MRSA PCR SCREENING  LACTIC ACID, PLASMA  TSH   ____________________________________________  EKG  ED ECG REPORT I, Loney Hering, the attending physician, personally viewed and interpreted this ECG.   Date: 10/11/2017  EKG Time: 0031  Rate: 100  Rhythm: normal sinus rhythm  Axis: normal  Intervals:none  ST&T Change: none  ____________________________________________  RADIOLOGY  ED MD interpretation:  CXR: Patchy foci of bilateral airspace disease may reflect multifocal infiltrates  Official radiology report(s): Dg Chest Port 1 View  Result Date: 10/11/2017 CLINICAL DATA:  Shortness of breath EXAM: PORTABLE CHEST 1 VIEW COMPARISON:  09/21/2017, 04/06/2017 FINDINGS: Patchy foci of airspace disease within the left upper lobe, right mid lung, and left greater than right lung bases. No pleural effusion. Normal cardiomediastinal silhouette. No pneumothorax. IMPRESSION: Patchy foci of bilateral airspace disease, may reflect multifocal infiltrates. Electronically Signed   By: Donavan Foil M.D.   On: 10/11/2017 01:15     ____________________________________________   PROCEDURES  Procedure(s) performed: None  Procedures  Critical Care performed: No  ____________________________________________   INITIAL IMPRESSION / ASSESSMENT AND PLAN / ED COURSE  As part of my medical decision making, I reviewed the following data within the electronic MEDICAL RECORD NUMBER Notes from prior ED visits and [place] Controlled Substance Database   This is a 62 year old male who  comes into the hospital today not feeling well.  He has had a cough and some shortness of breath.  Differential diagnosis includes pulmonary edema, pneumonia, pulmonary embolus.  I did check some blood work on the patient to include a CBC BMP troponin BNP lactic acid.  The patient's blood work it appears that he is hyponatremic with a sodium of 125.  His white count is normal and his blood gas shows a PCO2 of 57.  The patient's chest x-ray shows a multifocal opacities concerning for pneumonia.  The patient also has some hypoxia on his baseline O2.  I will give the patient a dose of cefepime and vancomycin and he will be admitted to the hospitalist service for further evaluation of his symptoms.      ____________________________________________   FINAL CLINICAL IMPRESSION(S) / ED DIAGNOSES  Final diagnoses:  Hypoxia  Healthcare-associated pneumonia  Hyponatremia     ED Discharge Orders    None       Note:  This document was prepared using Dragon voice recognition software and may include unintentional dictation errors.    Loney Hering, MD 10/11/17 [id number]

## 2017-10-11 NOTE — Progress Notes (Signed)
Daughter visited and was upset that her name was not included in the pt's contact information. Daughter told this Probation officer to "delete all contact persons" on the file and write her and her sister's name and number as the sole and primary contact person and next of kin. This RN updated pt's contact information on Epic.

## 2017-10-11 NOTE — Clinical Social Work Note (Signed)
Clinical Social Work Assessment  Patient Details  Name: David Hall. MRN: [id number] Date of Birth: February 19, 1956  Date of referral:  10/11/17               Reason for consult:  Other (Comment Required)(From [facility] )                Permission sought to share information with:  Chartered certified accountant granted to share information::  Yes, Verbal Permission Granted  Name::      David Hall   Agency::   [facility]   Relationship::     Contact Information:     Housing/Transportation Living arrangements for the past 2 months:  [facility] of Information:  Patient, Facility Patient Interpreter Needed:  None Criminal Activity/Legal Involvement Pertinent to Current Situation/Hospitalization:  No - Comment as needed Significant Relationships:  Friend Lives with:  Facility Resident, Friends Do you feel safe going back to the place where you live?  Yes Need for family participation in patient care:  Yes (Comment)  Care giving concerns:  Patient came to [facility] from [facility] where he is at for short term rehab.    Social Worker assessment / plan:  Holiday representative (CSW) reviewed chart and noted that patient is from [facility]. Per [facility] liaison patient is from [facility] and they admitted patient to [facility] from home through [facility]. Per [facility] patient can return to [facility] when medically stable. Per [facility] he will start [facility] SN[facility] authorization. CSW met with patient alone at bedside to discuss D/C plan. Patient was alert and oriented X4 and was laying in the bed. CSW introduced self and explained role of CSW department. Patient reported that he has been at [facility] for a couple of weeks for rehab and plans on returning there. CSW explained that [facility] will have to get insurance approval again. Patient verbalized his understanding. CSW will continue to follow and assist as needed.   Employment status:  Disabled (Comment on whether or not currently receiving  Disability) Insurance information:  Managed Medicare PT Recommendations:  Not assessed at this time Information / Referral to community resources:  [facility]  Patient/Family's Response to care:  Patient is agreeable to return to [facility].   Patient/Family's Understanding of and Emotional Response to Diagnosis, Current Treatment, and Prognosis:  Patient was very pleasant and thanked CSW for assistance.   Emotional Assessment Appearance:  Appears stated age Attitude/Demeanor/Rapport:    Affect (typically observed):  Accepting, Adaptable, Pleasant Orientation:  Oriented to Self, Oriented to Place, Oriented to  Time, Oriented to Situation Alcohol / Substance use:  Not Applicable Psych involvement (Current and /or in the community):  No (Comment)  Discharge Needs  Concerns to be addressed:  Discharge Planning Concerns Readmission within the last 30 days:  No Current discharge risk:  Dependent with Mobility Barriers to Discharge:  Continued Medical Work up   David Hall, David Beets, LCSW 10/11/2017, 10:06 AM

## 2017-10-11 NOTE — Progress Notes (Signed)
This is a 62 year old male admitted for pneumonia. 1.  Pneumonia: Healthcare associated.  Supplemental oxygen as needed.  Continue vancomycin and cefepime.  Currently the patient does not meet criteria for sepsis. 2.  COPD: Contributes to poor pulmonary toilet.  Continue albuterol as needed.  I will add Spiriva to his regimen. 3.  Hyponatremia: Secondary to poor p.o. intake and/or chronic lung inflammation. 4.  Hyperlipidemia: Continue statin therapy 5.  Bipolar disorder: May contribute to poor compliance with medications.  Continue brexpiprazole, Cymbalta and Depakote.  Trazodone for sleep.   5.  DVT prophylaxis: Lovenox 6.  GI prophylaxis: Pantoprazole per home regimen The patient is a DNR.  Time spent on admission orders and patient care approximately 45 minutes  Agree with above stated plan

## 2017-10-12 ENCOUNTER — Other Ambulatory Visit: Payer: Self-pay

## 2017-10-12 LAB — BASIC METABOLIC PANEL
Anion gap: 8 (ref 5–15)
BUN: 8 mg/dL (ref 6–20)
CO2: 22 mmol/L (ref 22–32)
Calcium: 7.8 mg/dL — ABNORMAL LOW (ref 8.9–10.3)
Chloride: 97 mmol/L — ABNORMAL LOW (ref 101–111)
Creatinine, Ser: 0.9 mg/dL (ref 0.61–1.24)
GFR calc Af Amer: >60 mL/min (ref >60)
GFR calc non Af Amer: >60 mL/min (ref >60)
Glucose, Bld: 76 mg/dL (ref 65–99)
Potassium: 4.3 mmol/L (ref 3.5–5.1)
Sodium: 127 mmol/L — ABNORMAL LOW (ref 135–145)

## 2017-10-12 MED ORDER — SODIUM CHLORIDE 1 G PO TABS
2.0000 g | ORAL_TABLET | Freq: Three times a day (TID) | ORAL | Status: DC
  Administered 2017-10-12 – 2017-10-13 (×4): 2 g via ORAL
  Filled 2017-10-12 (×5): qty 2

## 2017-10-12 NOTE — Progress Notes (Signed)
[place] at [place] NAME: David Hall    MR#:  [id number]  DATE OF BIRTH:  Feb 06, 1956  SUBJECTIVE:  CHIEF COMPLAINT:   Chief Complaint  Patient presents with  . Shortness of Breath  No events overnight, patient with continued increase O2 requirement, on 2 L at night only in the nursing home, sodium 127, encourage p.o. intake, decrease IV fluids, start salt tabs, physical therapy to evaluate/treat  REVIEW OF SYSTEMS:  CONSTITUTIONAL: No fever, fatigue or weakness.  EYES: No blurred or double vision.  EARS, NOSE, AND THROAT: No tinnitus or ear pain.  RESPIRATORY: No cough, shortness of breath, wheezing or hemoptysis.  CARDIOVASCULAR: No chest pain, orthopnea, edema.  GASTROINTESTINAL: No nausea, vomiting, diarrhea or abdominal pain.  GENITOURINARY: No dysuria, hematuria.  ENDOCRINE: No polyuria, nocturia,  HEMATOLOGY: No anemia, easy bruising or bleeding SKIN: No rash or lesion. MUSCULOSKELETAL: No joint pain or arthritis.   NEUROLOGIC: No tingling, numbness, weakness.  PSYCHIATRY: No anxiety or depression.   ROS  DRUG ALLERGIES:   Allergies  Allergen Reactions  . Quetiapine Other (See Comments)    "feels like someone is sticking pins in my legs." "makes me feel like someone is sticking needles and knives in my legs"  . Seroquel [Quetiapine Fumarate] Other (See Comments)    "feels like someone is sticking pins in my legs."  . Codeine Itching and Rash    VITALS:  Blood pressure (!) 135/51, pulse 85, temperature 99.2 F (37.3 C), temperature source Oral, resp. rate 18, height 5\' 8"  (1.727 m), weight 60.7 kg (133 lb 13.1 oz), SpO2 93 %.  PHYSICAL EXAMINATION:  GENERAL:  62 y.o.-year-old patient lying in the bed with no acute distress.  EYES: Pupils equal, round, reactive to light and accommodation. No scleral icterus. Extraocular muscles intact.  HEENT: Head atraumatic, normocephalic. Oropharynx and nasopharynx clear.  NECK:   Supple, no jugular venous distention. No thyroid enlargement, no tenderness.  LUNGS: Normal breath sounds bilaterally, no wheezing, rales,rhonchi or crepitation. No use of accessory muscles of respiration.  CARDIOVASCULAR: S1, S2 normal. No murmurs, rubs, or gallops.  ABDOMEN: Soft, nontender, nondistended. Bowel sounds present. No organomegaly or mass.  EXTREMITIES: No pedal edema, cyanosis, or clubbing.  NEUROLOGIC: Cranial nerves II through XII are intact. Muscle strength 5/5 in all extremities. Sensation intact. Gait not checked.  PSYCHIATRIC: The patient is alert and oriented x 3.  SKIN: No obvious rash, lesion, or ulcer.   Physical Exam LABORATORY PANEL:   CBC Recent Labs  Lab 10/11/17 0020  WBC 9.6  HGB 8.6*  HCT 26.2*  PLT 91*   ------------------------------------------------------------------------------------------------------------------  Chemistries  Recent Labs  Lab 10/12/17 0838  NA 127*  K 4.3  CL 97*  CO2 22  GLUCOSE 76  BUN 8  CREATININE 0.90  CALCIUM 7.8*   ------------------------------------------------------------------------------------------------------------------  Cardiac Enzymes Recent Labs  Lab 10/11/17 0020  TROPONINI 0.06*   ------------------------------------------------------------------------------------------------------------------  RADIOLOGY:  Dg Chest Port 1 View  Result Date: 10/11/2017 CLINICAL DATA:  Shortness of breath EXAM: PORTABLE CHEST 1 VIEW COMPARISON:  09/21/2017, 04/06/2017 FINDINGS: Patchy foci of airspace disease within the left upper lobe, right mid lung, and left greater than right lung bases. No pleural effusion. Normal cardiomediastinal silhouette. No pneumothorax. IMPRESSION: Patchy foci of bilateral airspace disease, may reflect multifocal infiltrates. Electronically Signed   By: Donavan Foil M.D.   On: 10/11/2017 01:15    ASSESSMENT AND PLAN:  This is a 62 year old male admitted for pneumonia.  1. acute  HCAP Resolving  Continue pneumonia protocol, empiric vancomycin/cefepime, follow-up on outstanding cultures   2. COPD with mild exacerbation Continue aggressive pulmonary toilet with bronchodilator therapy, inhaled corticosteroids twice daily, avoid IVs steroids at this time, mucolytic agents, supplemental oxygen with weaning as tolerated  3. Hyponatremia, acute Improving Secondary to poor p.o. intake  Continue IV fluids for rehydration, salt tabs 3 times daily, and encourage p.o. intake  4. Hyperlipidemia, chronic Stable Continue statin therapy  5. Bipolar disorder, chronic Stable May contribute to poor compliance with medications. Continue brexpiprazole, Cymbalta and Depakote. Trazodone for sleep.   All the records are reviewed and case discussed with Care Management/Social Workerr. Management plans discussed with the patient, family and they are in agreement.  CODE STATUS: dnr  TOTAL TIME TAKING CARE OF THIS PATIENT: 35 minutes.     POSSIBLE D/C IN 1-2 DAYS, DEPENDING ON CLINICAL CONDITION.   Avel Peace David [facility] M.D on 10/12/2017   Between 7am to 6pm - Pager - [phone]  After 6pm go to [email or web address] - password [email or web address] [facility]  Office  [phone]  CC: Primary care physician; Olin Hauser, DO  Note: This dictation was prepared with Dragon dictation along with smaller phrase technology. Any transcriptional errors that result from this process are unintentional.

## 2017-10-12 NOTE — Progress Notes (Signed)
Per [facility] authorization has been received. Patient is aware of above. Per MD patient is not medically stable for D/C today.    [employer], LCSW [phone]

## 2017-10-13 LAB — BASIC METABOLIC PANEL
Anion gap: 6 (ref 5–15)
BUN: 7 mg/dL (ref 6–20)
CO2: 26 mmol/L (ref 22–32)
Calcium: 7.9 mg/dL — ABNORMAL LOW (ref 8.9–10.3)
Chloride: 100 mmol/L — ABNORMAL LOW (ref 101–111)
Creatinine, Ser: 0.81 mg/dL (ref 0.61–1.24)
GFR calc Af Amer: >60 mL/min (ref >60)
GFR calc non Af Amer: >60 mL/min (ref >60)
Glucose, Bld: 76 mg/dL (ref 65–99)
Potassium: 3.8 mmol/L (ref 3.5–5.1)
Sodium: 132 mmol/L — ABNORMAL LOW (ref 135–145)

## 2017-10-13 LAB — VANCOMYCIN, TROUGH: Vancomycin Tr: 18 ug/mL (ref 15–20)

## 2017-10-13 MED ORDER — LEVOFLOXACIN 750 MG PO TABS: 500.0000 mg | ORAL_TABLET | Freq: Every day | ORAL | 0 refills | Status: AC

## 2017-10-13 MED ORDER — IPRATROPIUM-ALBUTEROL 0.5-2.5 (3) MG/3ML IN SOLN: 3.0000 mL | Freq: Four times a day (QID) | RESPIRATORY_TRACT | 0 refills | Status: DC

## 2017-10-13 MED ORDER — DOCUSATE SODIUM 100 MG PO CAPS: 100.0000 mg | ORAL_CAPSULE | Freq: Two times a day (BID) | ORAL | 0 refills | Status: DC

## 2017-10-13 MED ORDER — GUAIFENESIN ER 600 MG PO TB12: 600.0000 mg | ORAL_TABLET | Freq: Two times a day (BID) | ORAL | 2 refills | Status: DC

## 2017-10-13 MED ORDER — SODIUM CHLORIDE 1 G PO TABS: 2.0000 g | ORAL_TABLET | Freq: Three times a day (TID) | ORAL | 0 refills | Status: DC

## 2017-10-13 MED ORDER — OXYCODONE HCL 5 MG PO TABS: 5.0000 mg | ORAL_TABLET | ORAL | 0 refills | Status: DC | PRN

## 2017-10-13 MED ORDER — CEFDINIR 300 MG PO CAPS: 300.0000 mg | ORAL_CAPSULE | Freq: Two times a day (BID) | ORAL | 0 refills | Status: DC

## 2017-10-13 NOTE — Progress Notes (Signed)
Patient is medically stable for D/C back to [facility] today. Per [name] liaison patient can come today to room 712. RN will call report to [facility] at [phone] and arrange EMS for transport. Clinical Education officer, museum (CSW) sent D/C orders to [facility] via [facility]. Patient is aware of above. Patient's caregiver [name] is at bedside and aware of above. CSW contacted patient's daughter [name] and made her aware of above. Please reconsult if future social work needs arise. CSW signing off.   [employer], LCSW [phone]

## 2017-10-13 NOTE — Progress Notes (Signed)
EMS here to transport the patient

## 2017-10-13 NOTE — Progress Notes (Signed)
Report called to [place] at [facility].  EMS notified for transport and informed that the patient will require oxygen

## 2017-10-13 NOTE — Discharge Summary (Signed)
[place] at [place] NAME: David Hall    MR#:  [id number]  DATE OF BIRTH:  1956/07/22  DATE OF ADMISSION:  10/11/2017 ADMITTING PHYSICIAN: Harrie Foreman, MD  DATE OF DISCHARGE: No discharge date for patient encounter.  PRIMARY CARE PHYSICIAN: Olin Hauser, DO    ADMISSION DIAGNOSIS:  Hyponatremia [E87.1] SOB (shortness of breath) [R06.02] Hypoxia [R09.02] Healthcare-associated pneumonia [J18.9]  DISCHARGE DIAGNOSIS:  Active Problems:   HCAP (healthcare-associated pneumonia)   SECONDARY DIAGNOSIS:   Past Medical History:  Diagnosis Date  . Bipolar disorder ([place])   . COPD (chronic obstructive pulmonary disease) ([place])    on home oxygen.   [name] GERD (gastroesophageal reflux disease)   . Hyperlipidemia   . Hypertension   . Insomnia, controlled   . Obesity   . Oxygen deficiency    2 LNC at night  . Shortness of breath   . Stroke [facility]) 2014   R sided hemiplegia  . Substance abuse ([place]) 2016   cocaine use.    HOSPITAL COURSE:  This is a 62 year old male admitted for pneumonia.  1. acute HCAP Resolving  Treated on our pneumonia protocol, empiric vancomycin/cefepime while in house and patient was weaned to his O2, on baseline requirement of 2 L via nasal cannula   2. COPD with mild exacerbation Resolving Treated w/ aggressive pulmonary toilet with bronchodilator therapy, inhaled corticosteroids twice daily, avoided IVs steroids, provided mucolytic agents, and oxygen per above  3. Hyponatremia, acute Improving Secondary to poor p.o. intake  Treated w/ IV fluids for rehydration, salt tabs 3 times daily, and encouraged p.o. intake  4. Hyperlipidemia, chronic Stable Continued statin therapy  5. Bipolar disorder, chronic Stable on current regiment   DISCHARGE CONDITIONS:  On the day of discharge patient is afebrile, hemodynamically stable, tolerating diet, ready for discharge back to  skilled nursing facility, for more specific details please see chart   CONSULTS OBTAINED:    DRUG ALLERGIES:   Allergies  Allergen Reactions  . Quetiapine Other (See Comments)    "feels like someone is sticking pins in my legs." "makes me feel like someone is sticking needles and knives in my legs"  . Seroquel [Quetiapine Fumarate] Other (See Comments)    "feels like someone is sticking pins in my legs."  . Codeine Itching and Rash    DISCHARGE MEDICATIONS:   Allergies as of 10/13/2017      Reactions   Quetiapine Other (See Comments)   "feels like someone is sticking pins in my legs." "makes me feel like someone is sticking needles and knives in my legs"   Seroquel [quetiapine Fumarate] Other (See Comments)   "feels like someone is sticking pins in my legs."   Codeine Itching, Rash      Medication List    TAKE these medications   albuterol (2.5 MG/3ML) 0.083% nebulizer solution Commonly known as:  PROVENTIL Take 3 mLs (2.5 mg total) by nebulization every 6 (six) hours as needed for wheezing or shortness of breath.   PROAIR HFA 108 (90 Base) MCG/ACT inhaler Generic drug:  albuterol INHALE 2 PUFFS BY MOUTH INTO THE LUNGS FOUR TIMES DAILY AS NEEDED   aspirin EC 325 MG tablet Take 325 mg by mouth daily.   atorvastatin 40 MG tablet Commonly known as:  LIPITOR TAKE 1 TABLET BY MOUTH DAILY.   baclofen 10 MG tablet Commonly known as:  LIORESAL TAKE 1/2-1 TABLET BY MOUTH THREE TIMES DAILY AS NEEDED FOR MUSCLE  SPASMS   bisacodyl 10 MG suppository Commonly known as:  DULCOLAX Place 1 suppository (10 mg total) rectally daily as needed for moderate constipation.   cefdinir 300 MG capsule Commonly known as:  OMNICEF Take 1 capsule (300 mg total) by mouth 2 (two) times daily.   divalproex 500 MG DR tablet Commonly known as:  DEPAKOTE Take 1 tablet (500 mg total) by mouth 2 (two) times daily.   docusate sodium 100 MG capsule Commonly known as:  COLACE Take 1 capsule  (100 mg total) by mouth 2 (two) times daily.   DULoxetine 30 MG capsule Commonly known as:  CYMBALTA Take 90 mg by mouth daily.   enoxaparin 40 MG/0.4ML injection Commonly known as:  LOVENOX Inject 0.4 mLs (40 mg total) into the skin daily. Take lovenox 40 mg sq daily for 2 weeks to prevent blood clots   gabapentin 600 MG tablet Commonly known as:  NEURONTIN TAKE 1 TABLET BY MOUTH TWICE DAILY, IF NEEDED MAY TAKE AN ADDITIONAL DOSE IN THE AFTERNOON. What changed:  See the new instructions.   guaiFENesin 600 MG 12 hr tablet Commonly known as:  MUCINEX Take 1 tablet (600 mg total) by mouth 2 (two) times daily.   levofloxacin 750 MG tablet Commonly known as:  LEVAQUIN Take 0.5 tablets (375 mg total) by mouth daily for 10 days.   mupirocin ointment 2 % Commonly known as:  BACTROBAN Place 1 application into the nose 2 (two) times daily.   omeprazole 20 MG capsule Commonly known as:  PRILOSEC TAKE 1 CAPSULE BY MOUTH TWICE DAILY   oxyCODONE 5 MG immediate release tablet Commonly known as:  Oxy IR/ROXICODONE Take 1 tablet (5 mg total) by mouth every 3 (three) hours as needed for moderate pain ((score 4 to 6)).   potassium chloride SA 20 MEQ tablet Commonly known as:  K-DUR,KLOR-CON Take 1 tablet (20 mEq total) by mouth daily.   REXULTI 2 MG Tabs Generic drug:  Brexpiprazole Take 4 mg by mouth at bedtime.   sodium chloride 1 g tablet Take 2 tablets (2 g total) by mouth 3 (three) times daily with meals.   trazodone 300 MG tablet Commonly known as:  DESYREL Take 300 mg by mouth at bedtime. Reported on 10/27/2015        DISCHARGE INSTRUCTIONS:   If you experience worsening of your admission symptoms, develop shortness of breath, life threatening emergency, suicidal or homicidal thoughts you must seek medical attention immediately by calling 911 or calling your MD immediately  if symptoms less severe.  You Must read complete instructions/literature along with all the  possible adverse reactions/side effects for all the Medicines you take and that have been prescribed to you. Take any new Medicines after you have completely understood and accept all the possible adverse reactions/side effects.   Please note  You were cared for by a hospitalist during your hospital stay. If you have any questions about your discharge medications or the care you received while you were in the hospital after you are discharged, you can call the unit and asked to speak with the hospitalist on call if the hospitalist that took care of you is not available. Once you are discharged, your primary care physician will handle any further medical issues. Please note that NO REFILLS for any discharge medications will be authorized once you are discharged, as it is imperative that you return to your primary care physician (or establish a relationship with a primary care physician if you do not  have one) for your aftercare needs so that they can reassess your need for medications and monitor your lab values.    Today   CHIEF COMPLAINT:   Chief Complaint  Patient presents with  . Shortness of Breath    HISTORY OF PRESENT ILLNESS:  HPI: The patient with past medical history of COPD, hypertension hyperlipidemia presents to the emergency department with shortness of breath.  The patient states that he began to feel very weak yesterday and became progressively more dyspneic.  He does not know if he had a fever but he admits to having a wet cough that is occasionally productive of thick white phlegm.  Chest x-ray revealed multifocal pneumonia.  The patient was given broad-spectrum antibiotics in the emergency department prior to the hospitalist service being called for further management.  VITAL SIGNS:  Blood pressure 132/76, pulse 91, temperature 97.9 F (36.6 C), temperature source Oral, resp. rate 16, height 5\' 8"  (1.727 m), weight 59.3 kg (130 lb 11.7 oz), SpO2 100 %.  I/O:     Intake/Output Summary (Last 24 hours) at 10/13/2017 1026 Last data filed at 10/13/2017 0921 Gross per 24 hour  Intake 2537.5 ml  Output -  Net 2537.5 ml    PHYSICAL EXAMINATION:  GENERAL:  62 y.o.-year-old patient lying in the bed with no acute distress.  EYES: Pupils equal, round, reactive to light and accommodation. No scleral icterus. Extraocular muscles intact.  HEENT: Head atraumatic, normocephalic. Oropharynx and nasopharynx clear.  NECK:  Supple, no jugular venous distention. No thyroid enlargement, no tenderness.  LUNGS: Normal breath sounds bilaterally, no wheezing, rales,rhonchi or crepitation. No use of accessory muscles of respiration.  CARDIOVASCULAR: S1, S2 normal. No murmurs, rubs, or gallops.  ABDOMEN: Soft, non-tender, non-distended. Bowel sounds present. No organomegaly or mass.  EXTREMITIES: No pedal edema, cyanosis, or clubbing.  NEUROLOGIC: Cranial nerves II through XII are intact. Muscle strength 5/5 in all extremities. Sensation intact. Gait not checked.  PSYCHIATRIC: The patient is alert and oriented x 3.  SKIN: No obvious rash, lesion, or ulcer.   DATA REVIEW:   CBC Recent Labs  Lab 10/11/17 0020  WBC 9.6  HGB 8.6*  HCT 26.2*  PLT 91*    Chemistries  Recent Labs  Lab 10/13/17 0743  NA 132*  K 3.8  CL 100*  CO2 26  GLUCOSE 76  BUN 7  CREATININE 0.81  CALCIUM 7.9*    Cardiac Enzymes Recent Labs  Lab 10/11/17 0020  TROPONINI 0.06*    Microbiology Results  Results for orders placed or performed during the hospital encounter of 10/11/17  Blood culture (routine x 2)     Status: None (Preliminary result)   Collection Time: 10/11/17 12:58 AM  Result Value Ref Range Status   Specimen Description BLOOD LEFT ANTECUBITAL  Final   Special Requests   Final    BOTTLES DRAWN AEROBIC AND ANAEROBIC Blood Culture results may not be optimal due to an excessive volume of blood received in culture bottles   Culture   Final    NO GROWTH 2  DAYS Performed at [facility], [address], [place], [place] [address]    Report Status PENDING  Incomplete  Blood culture (routine x 2)     Status: None (Preliminary result)   Collection Time: 10/11/17  1:17 AM  Result Value Ref Range Status   Specimen Description BLOOD LEFT AC  Final   Special Requests   Final    BOTTLES DRAWN AEROBIC AND ANAEROBIC Blood  Culture results may not be optimal due to an excessive volume of blood received in culture bottles   Culture   Final    NO GROWTH 2 DAYS Performed at [facility], [address] [place], [place] [address]    Report Status PENDING  Incomplete    RADIOLOGY:  No results found.  EKG:   Orders placed or performed during the hospital encounter of 10/11/17  . ED EKG  . ED EKG      Management plans discussed with the patient, family and they are in agreement.  CODE STATUS:     Code Status Orders  (From admission, onward)        Start     Ordered   10/11/17 0441  Do not attempt resuscitation (DNR)  Continuous    Question Answer Comment  In the event of cardiac or respiratory ARREST Do not call a "code blue"   In the event of cardiac or respiratory ARREST Do not perform Intubation, CPR, defibrillation or ACLS   In the event of cardiac or respiratory ARREST Use medication by any route, position, wound care, and other measures to relive pain and suffering. May use oxygen, suction and manual treatment of airway obstruction as needed for comfort.      10/11/17 0440    Code Status History    Date Active Date Inactive Code Status Order ID Comments User Context   09/22/2017 08:29 09/24/2017 19:24 DNR [id number]  Max Sane, MD Inpatient   09/21/2017 04:15 09/22/2017 08:29 Full Code [id number]  Harrie Foreman, MD ED   09/28/2013 16:29 10/11/2013 17:59 Full Code [id number]  Bary Leriche, PA-C Inpatient    Advance Directive Documentation     Most Recent Value  Type of Advance Directive  Out of facility DNR  (pink MOST or yellow form)  Pre-existing out of facility DNR order (yellow form or pink MOST form)  Pink MOST form placed in chart (order not valid for inpatient use)  "MOST" Form in Place?  No data      TOTAL TIME TAKING CARE OF THIS PATIENT: 45 minutes.    Avel Peace Devinn Hurwitz M.D on 10/13/2017 at 10:26 AM  Between 7am to 6pm - Pager - [phone]  After 6pm go to [email or web address] - password [email or web address] [facility]  Office  [phone]  CC: Primary care physician; Olin Hauser, DO   Note: This dictation was prepared with Dragon dictation along with smaller phrase technology. Any transcriptional errors that result from this process are unintentional.

## 2017-10-16 LAB — CULTURE, BLOOD (ROUTINE X 2)
Culture: NO GROWTH
Culture: NO GROWTH

## 2017-10-21 ENCOUNTER — Other Ambulatory Visit: Payer: Self-pay

## 2017-10-21 ENCOUNTER — Encounter: Payer: Self-pay | Admitting: Emergency Medicine

## 2017-10-21 ENCOUNTER — Emergency Department
Admission: EM | Admit: 2017-10-21 | Discharge: 2017-10-21 | Disposition: A | Discharge status: Nursing Home (Includes ICF) | Payer: Medicare Other | Attending: Emergency Medicine | Admitting: Emergency Medicine

## 2017-10-21 DIAGNOSIS — Z7901 Long term (current) use of anticoagulants: Secondary | ICD-10-CM | POA: Insufficient documentation

## 2017-10-21 DIAGNOSIS — Z79899 Other long term (current) drug therapy: Secondary | ICD-10-CM | POA: Diagnosis not present

## 2017-10-21 DIAGNOSIS — I10 Essential (primary) hypertension: Secondary | ICD-10-CM | POA: Insufficient documentation

## 2017-10-21 DIAGNOSIS — Z8673 Personal history of transient ischemic attack (TIA), and cerebral infarction without residual deficits: Secondary | ICD-10-CM | POA: Insufficient documentation

## 2017-10-21 DIAGNOSIS — J449 Chronic obstructive pulmonary disease, unspecified: Secondary | ICD-10-CM | POA: Insufficient documentation

## 2017-10-21 DIAGNOSIS — Z7982 Long term (current) use of aspirin: Secondary | ICD-10-CM | POA: Insufficient documentation

## 2017-10-21 DIAGNOSIS — Z139 Encounter for screening, unspecified: Secondary | ICD-10-CM | POA: Diagnosis not present

## 2017-10-21 DIAGNOSIS — F17210 Nicotine dependence, cigarettes, uncomplicated: Secondary | ICD-10-CM | POA: Insufficient documentation

## 2017-10-21 DIAGNOSIS — R799 Abnormal finding of blood chemistry, unspecified: Secondary | ICD-10-CM | POA: Diagnosis present

## 2017-10-21 LAB — CBC WITH DIFFERENTIAL/PLATELET
Band Neutrophils: 0 %
Basophils Absolute: 0 10*3/uL (ref 0–0.1)
Basophils Relative: 1 %
Blasts: 0 %
Eosinophils Absolute: 0.1 10*3/uL (ref 0–0.7)
Eosinophils Relative: 2 %
HCT: 24.5 % — ABNORMAL LOW (ref 40.0–52.0)
Hemoglobin: 8.1 g/dL — ABNORMAL LOW (ref 13.0–18.0)
Lymphocytes Relative: 24 %
Lymphs Abs: 1.1 10*3/uL (ref 1.0–3.6)
MCH: 28.6 pg (ref 26.0–34.0)
MCHC: 33 g/dL (ref 32.0–36.0)
MCV: 86.5 fL (ref 80.0–100.0)
Metamyelocytes Relative: 1 %
Monocytes Absolute: 0.6 10*3/uL (ref 0.2–1.0)
Monocytes Relative: 12 %
Myelocytes: 0 %
Neutro Abs: 2.9 10*3/uL (ref 1.4–6.5)
Neutrophils Relative %: 60 %
Other: 0 %
Platelets: 105 10*3/uL — ABNORMAL LOW (ref 150–440)
Promyelocytes Absolute: 0 %
RBC: 2.84 MIL/uL — ABNORMAL LOW (ref 4.40–5.90)
RDW: 15.5 % — ABNORMAL HIGH (ref 11.5–14.5)
WBC: 4.7 10*3/uL (ref 3.8–10.6)
nRBC: 0 /100 WBC

## 2017-10-21 LAB — COMPREHENSIVE METABOLIC PANEL
ALT: 6 U/L — ABNORMAL LOW (ref 17–63)
AST: 16 U/L (ref 15–41)
Albumin: 2.8 g/dL — ABNORMAL LOW (ref 3.5–5.0)
Alkaline Phosphatase: 65 U/L (ref 38–126)
Anion gap: 11 (ref 5–15)
BUN: 10 mg/dL (ref 6–20)
CO2: 25 mmol/L (ref 22–32)
Calcium: 8.2 mg/dL — ABNORMAL LOW (ref 8.9–10.3)
Chloride: 91 mmol/L — ABNORMAL LOW (ref 101–111)
Creatinine, Ser: 0.84 mg/dL (ref 0.61–1.24)
GFR calc Af Amer: >60 mL/min (ref >60)
GFR calc non Af Amer: >60 mL/min (ref >60)
Glucose, Bld: 144 mg/dL — ABNORMAL HIGH (ref 65–99)
Potassium: 4.4 mmol/L (ref 3.5–5.1)
Sodium: 127 mmol/L — ABNORMAL LOW (ref 135–145)
Total Bilirubin: 0.4 mg/dL (ref 0.3–1.2)
Total Protein: 6.3 g/dL — ABNORMAL LOW (ref 6.5–8.1)

## 2017-10-21 LAB — TYPE AND SCREEN
ABO/RH(D): A POS
Antibody Screen: NEGATIVE

## 2017-10-21 NOTE — ED Provider Notes (Signed)
[facility] Emergency Department Provider Note  ___________________________________________   First MD Initiated Contact with Patient 10/21/17 2131     (approximate)  I have reviewed the triage vital signs and the nursing notes.   HISTORY  Chief Complaint Abnormal Lab   HPI David Hall. is a 62 y.o. male with history of bipolar disorder as well as substance abuse who is presenting from rehab where he has currently admitted for a hip fracture.  He was found to have a hemoglobin of 6.3 on outpatient blood draw.  However, he has no complaints.  Denies any weakness.  Denies any blood in his stool.  Denies any shortness of breath.  Denies any vomiting blood.  Past Medical History:  Diagnosis Date  . Bipolar disorder ([place])   . COPD (chronic obstructive pulmonary disease) ([place])    on home oxygen.   [name] GERD (gastroesophageal reflux disease)   . Hyperlipidemia   . Hypertension   . Insomnia, controlled   . Obesity   . Oxygen deficiency    2 LNC at night  . Shortness of breath   . Stroke [facility]) 2014   R sided hemiplegia  . Substance abuse ([place]) 2016   cocaine use.    Patient Active Problem List   Diagnosis Date Noted  . HCAP (healthcare-associated pneumonia) 10/11/2017  . Hip fracture ([place]) 09/21/2017  . Hypokalemia 04/19/2017  . GERD (gastroesophageal reflux disease) 12/10/2015  . 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 Right 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) ([place]) 10/27/2015  . Abnormal MRI of head 10/27/2015  . Memory loss 07/25/2015  . Faintness 07/25/2015  . On home oxygen therapy 04/02/2015  . Bipolar 1 disorder, depressed ([place]) 01/27/2015  . Pulmonary nodule 01/27/2015  . Blood glucose elevated 01/27/2015  . Restless leg 01/27/2015  . Compulsive tobacco user syndrome 01/27/2015  . Arthritis of knee, degenerative 01/27/2015  . Benign hypertension with CKD (chronic kidney  disease), stage II 01/27/2015  . Cocaine abuse ([place]) 01/27/2015  . COPD (chronic obstructive pulmonary disease) with emphysema ([place]) 01/27/2015  . Chronic respiratory failure ([place]) 11/12/2014  . Recurrent falls 05/29/2014  . Cervical pain 05/29/2014  . Bursitis of elbow 05/29/2014  . Elbow pain 05/29/2014  . Dislocated shoulder 05/21/2014  . Lung mass 05/21/2014  . Dyslipidemia 10/11/2013  . History of CVA with residual deficit 09/28/2013    Past Surgical History:  Procedure Laterality Date  . CARPAL TUNNEL RELEASE    . INTRAMEDULLARY (IM) NAIL INTERTROCHANTERIC Right 09/21/2017   Procedure: INTRAMEDULLARY (IM) NAIL INTERTROCHANTRIC;  Surgeon: Corky Mull, MD;  Location: [facility] ORS;  Service: Orthopedics;  Laterality: Right;  . TONSILLECTOMY      Prior to Admission medications   Medication Sig Start Date End Date Taking? Authorizing Provider  albuterol (PROVENTIL) (2.5 MG/3ML) 0.083% nebulizer solution Take 3 mLs (2.5 mg total) by nebulization every 6 (six) hours as needed for wheezing or shortness of breath. 09/24/17   Epifanio Lesches, MD  aspirin EC 325 MG tablet Take 325 mg by mouth daily.    [provider]  atorvastatin (LIPITOR) 40 MG tablet TAKE 1 TABLET BY MOUTH DAILY. 09/06/17   Karamalegos, Devonne Doughty, DO  baclofen (LIORESAL) 10 MG tablet TAKE 1/2-1 TABLET BY MOUTH THREE TIMES DAILY AS NEEDED FOR MUSCLE SPASMS 05/02/17   Karamalegos, Devonne Doughty, DO  bisacodyl (DULCOLAX) 10 MG suppository Place 1 suppository (10 mg total) rectally daily as needed for moderate constipation. 09/24/17  Epifanio Lesches, MD  Brexpiprazole (REXULTI) 2 MG TABS Take 4 mg by mouth at bedtime.     [provider]  cefdinir (OMNICEF) 300 MG capsule Take 1 capsule (300 mg total) by mouth 2 (two) times daily. 10/13/17   Salary, Avel Peace, MD  divalproex (DEPAKOTE) 500 MG DR tablet Take 1 tablet (500 mg total) by mouth 2 (two) times daily. 09/24/17   Epifanio Lesches, MD    docusate sodium (COLACE) 100 MG capsule Take 1 capsule (100 mg total) by mouth 2 (two) times daily. 10/13/17   Salary, Avel Peace, MD  DULoxetine (CYMBALTA) 30 MG capsule Take 90 mg by mouth daily.    [provider]  enoxaparin (LOVENOX) 40 MG/0.4ML injection Inject 0.4 mLs (40 mg total) into the skin daily. Take lovenox 40 mg sq daily for 2 weeks to prevent blood clots 09/24/17   Epifanio Lesches, MD  gabapentin (NEURONTIN) 600 MG tablet TAKE 1 TABLET BY MOUTH TWICE DAILY, IF NEEDED MAY TAKE AN ADDITIONAL DOSE IN THE AFTERNOON. Patient taking differently: Take 1 tablet by mouth three times daily 10/06/17   Parks Ranger, Devonne Doughty, DO  guaiFENesin (MUCINEX) 600 MG 12 hr tablet Take 1 tablet (600 mg total) by mouth 2 (two) times daily. 10/13/17 10/13/18  Salary, Holly Bodily D, MD  ipratropium-albuterol (DUONEB) 0.5-2.5 (3) MG/3ML SOLN Take 3 mLs by nebulization 4 (four) times daily. 10/13/17   Salary, Avel Peace, MD  levofloxacin (LEVAQUIN) 750 MG tablet Take 0.5 tablets (375 mg total) by mouth daily for 10 days. 10/13/17 10/23/17  Salary, Avel Peace, MD  mupirocin ointment (BACTROBAN) 2 % Place 1 application into the nose 2 (two) times daily. Patient not taking: Reported on 10/11/2017 09/24/17   Epifanio Lesches, MD  omeprazole (PRILOSEC) 20 MG capsule TAKE 1 CAPSULE BY MOUTH TWICE DAILY 09/06/17   Olin Hauser, DO  oxyCODONE (OXY IR/ROXICODONE) 5 MG immediate release tablet Take 1 tablet (5 mg total) by mouth every 3 (three) hours as needed for moderate pain ((score 4 to 6)). 10/13/17   Salary, Avel Peace, MD  potassium chloride SA (K-DUR,KLOR-CON) 20 MEQ tablet Take 1 tablet (20 mEq total) by mouth daily. 04/19/17   Karamalegos, Devonne Doughty, DO  PROAIR HFA 108 [phone] Base) MCG/ACT inhaler INHALE 2 PUFFS BY MOUTH INTO THE LUNGS FOUR TIMES DAILY AS NEEDED 10/03/17   Parks Ranger, Devonne Doughty, DO  sodium chloride 1 g tablet Take 2 tablets (2 g total) by mouth 3 (three) times daily with meals. 10/13/17    Salary, Avel Peace, MD  trazodone (DESYREL) 300 MG tablet Take 300 mg by mouth at bedtime. Reported on 10/27/2015    [provider]    Allergies Quetiapine; Seroquel [quetiapine fumarate]; and Codeine  Family History  Problem Relation Age of Onset  . CAD Mother   . Hypertension Mother   . Stroke Mother   . Lung disease Father     Social History Social History   Tobacco Use  . Smoking status: Current Every Day Smoker    Packs/day: 0.50    Years: 45.00    Pack years: 22.50    Types: Cigarettes  . Smokeless tobacco: Current User  . Tobacco comment: smoking 10-15 cigs daily   Substance Use Topics  . Alcohol use: No    Alcohol/week: 0.0 oz  . Drug use: No    Review of Systems  Constitutional: No fever/chills Eyes: No visual changes. ENT: No sore throat. Cardiovascular: Denies chest pain. Respiratory: Denies shortness of breath. Gastrointestinal:  No abdominal pain.  No nausea, no vomiting.  No diarrhea.  No constipation. Genitourinary: Negative for dysuria. Musculoskeletal: Negative for back pain. Skin: Negative for rash. Neurological: Negative for headaches, focal weakness or numbness.   ____________________________________________   PHYSICAL EXAM:  VITAL SIGNS: ED Triage Vitals  Enc Vitals Group     BP 10/21/17 1856 (!) 117/54     Pulse Rate 10/21/17 1856 71     Resp 10/21/17 2128 16     Temp 10/21/17 1856 97.7 F (36.5 C)     Temp Source 10/21/17 1856 Oral     SpO2 10/21/17 1856 100 %     Weight 10/21/17 1857 130 lb (59 kg)     Height 10/21/17 1857 5\' 8"  (1.727 m)     Head Circumference --      [facility] --      Pain Score 10/21/17 1857 10     Pain Loc --      Pain Edu? --      Excl. in [place]? --     Constitutional: Alert and oriented. Well appearing and in no acute distress. Eyes: Conjunctivae are normal.  Head: Atraumatic. Nose: No congestion/rhinnorhea. Mouth/Throat: Mucous membranes are moist.  Neck: No stridor.   Cardiovascular:  Normal rate, regular rhythm. Grossly normal heart sounds.  Respiratory: Normal respiratory effort.  No retractions. Lungs CTAB. Gastrointestinal: Soft and nontender. No distention.  Musculoskeletal: No lower extremity tenderness nor edema.  No joint effusions. Neurologic:  Normal speech and language. No gross focal neurologic deficits are appreciated. Skin:  Skin is warm, dry and intact. No rash noted. Psychiatric: Mood and affect are normal. Speech and behavior are normal.  ____________________________________________   LABS (all labs ordered are listed, but only abnormal results are displayed)  Labs Reviewed  CBC WITH DIFFERENTIAL/PLATELET - Abnormal; Notable for the following components:      Result Value   RBC 2.84 (*)    Hemoglobin 8.1 (*)    HCT 24.5 (*)    RDW 15.5 (*)    Platelets 105 (*)    All other components within normal limits  COMPREHENSIVE METABOLIC PANEL - Abnormal; Notable for the following components:   Sodium 127 (*)    Chloride 91 (*)    Glucose, Bld 144 (*)    Calcium 8.2 (*)    Total Protein 6.3 (*)    Albumin 2.8 (*)    ALT 6 (*)    All other components within normal limits  TYPE AND SCREEN   ____________________________________________  EKG   ____________________________________________  RADIOLOGY   ____________________________________________   PROCEDURES  Procedure(s) performed:   Procedures  Critical Care performed:   ____________________________________________   INITIAL IMPRESSION / ASSESSMENT AND PLAN / ED COURSE  Pertinent labs & imaging results that were available during my care of the patient were reviewed by me and considered in my medical decision making (see chart for details).  DDX: Anemia, lab error, weakness, hematemesis, hematuria, GI bleeding As part of my medical decision making, I reviewed the following data within the electronic MEDICAL RECORD NUMBER Notes from prior ED visits   Hemoglobin in the emergency  department here is 8.1.  Patient is asymptomatic.  Likely lab error.  Patient will be discharged to rehab at this time. ____________________________________________   FINAL CLINICAL IMPRESSION(S) / ED DIAGNOSES  Medical screening exam.    NEW MEDICATIONS STARTED DURING THIS VISIT:  New Prescriptions   No medications on file     Note:  This document  was prepared using Systems analyst and may include unintentional dictation errors.     Orbie Pyo, MD 10/21/17 [phone]

## 2017-10-21 NOTE — ED Notes (Signed)
Telephone call made to patient's daughter, Tali Coster, to update on patient's discharge with the patient's permission. Patient will be going back to [facility] via EMS due to recent hip surgery.

## 2017-10-21 NOTE — ED Triage Notes (Signed)
Pt ems from [facility] for low hgb. [facility]. Pt w/o complaints. Denies sob, decrease in mobility,

## 2017-10-21 NOTE — ED Notes (Signed)
[facility] called to transport back to [facility]

## 2017-11-03 ENCOUNTER — Telehealth: Payer: Self-pay | Admitting: Family Medicine

## 2017-11-03 NOTE — Telephone Encounter (Signed)
Lauren with [facility] needs a order for a bath aid for patient [phone]

## 2017-11-04 NOTE — Telephone Encounter (Signed)
Verbal given and they will fax order as well.

## 2017-11-06 ENCOUNTER — Other Ambulatory Visit: Payer: Self-pay | Admitting: Family Medicine

## 2017-11-11 ENCOUNTER — Ambulatory Visit (INDEPENDENT_AMBULATORY_CARE_PROVIDER_SITE_OTHER): Payer: Medicare Other | Admitting: Family Medicine

## 2017-11-11 ENCOUNTER — Encounter: Payer: Self-pay | Admitting: Family Medicine

## 2017-11-11 VITALS — BP 138/77 | HR 108 | Temp 98.4°F | Ht 68.0 in | Wt 130.0 lb

## 2017-11-11 DIAGNOSIS — G890 Central pain syndrome: Secondary | ICD-10-CM | POA: Diagnosis not present

## 2017-11-11 DIAGNOSIS — F319 Bipolar disorder, unspecified: Secondary | ICD-10-CM | POA: Diagnosis not present

## 2017-11-11 DIAGNOSIS — J9610 Chronic respiratory failure, unspecified whether with hypoxia or hypercapnia: Secondary | ICD-10-CM | POA: Diagnosis not present

## 2017-11-11 DIAGNOSIS — J439 Emphysema, unspecified: Secondary | ICD-10-CM | POA: Diagnosis not present

## 2017-11-11 DIAGNOSIS — Z515 Encounter for palliative care: Secondary | ICD-10-CM

## 2017-11-11 DIAGNOSIS — Z8781 Personal history of (healed) traumatic fracture: Secondary | ICD-10-CM

## 2017-11-11 DIAGNOSIS — I69359 Hemiplegia and hemiparesis following cerebral infarction affecting unspecified side: Secondary | ICD-10-CM

## 2017-11-11 MED ORDER — GABAPENTIN 600 MG PO TABS: 600.0000 mg | ORAL_TABLET | Freq: Three times a day (TID) | ORAL | 2 refills | Status: DC

## 2017-11-11 MED ORDER — DIVALPROEX SODIUM 500 MG PO DR TAB: 500.0000 mg | DELAYED_RELEASE_TABLET | Freq: Two times a day (BID) | ORAL | 2 refills | Status: DC

## 2017-11-11 NOTE — Progress Notes (Signed)
Subjective:    Patient ID: David Hall., male    DOB: [birth year], 62 y.o.   MRN: [id number]  David Hall. is a 62 y.o. male presenting on 11/11/2017 for Hospitalization Follow-up (bipolar disorder as well as substance abuse ) and COPD  History primarily provided by caregiver, Sandrea Hughs. Patient provides some history as well.  HPI   HOSPITAL FOLLOW-UP VISIT  Hospital/Location: [facility]  Date of Admission:   1# - 09/20/17 - Right Hip Fracture, Fall  2# - 10/11/17 - Hypoxia, Healthcare Associated Pneumonia  Date of Discharge:  1# - 09/24/17  2# - 10/13/17  Transitions of care telephone call: Not completed. Patient has had 2 hospitalizations and SNF stay.   [facility] H&P and Discharge Summary have been reviewed - Patient presents today about 1 month after most recent hospitalization, and about 1-2 weeks after recent SNF stay. Brief summary of recent course, patient had symptoms of initial Fall on 09/20/17 resulted in R hip fracture, hospitalized with surgical repair 09/21/17, then he was discharged to [facility], he developed healthcare associated PNA, re-admitted, treated for PNA, and discharged back to [facility], ultimately stayed at [facility] for max time allowed for rehab and treatment, then discharged back to home on 11/03/17 with [facility].  - Today reports overall has done fairly well after discharge. Symptoms of R hip pain has improved still limited and deconditioned limited mobility mostly non ambulatory. His respiratory symptoms have improved as well. He is still smoking cigarettes. - Followed by [facility] RN PT OT CSW - See current med list below. He was DC'd off anti-HTN medications after hip surgery - He was on ASA 325mg  after surgery per Ortho, and now requesting to reduce back to 81mg , in past this was the dose he was on. - He was taking Oxycodone 5mg  IR 3-4 times daily PRN R hip pain, has finished this med, requesting  refill, see below regarding palliative - History of Bipolar Type 1 Disorder - has been on mood stabilizers, needs refill Depakote 500mg  BID, Gabapentin 600mg  TID - Additionally he has declined hospice evaluation in past, but agreed to [facility] were scheduled to come on early March but was cancelled due to hospitalization. Sher[facility] has not called them back yet to re-schedule. - He uses electric wheelchair for mobility. After hip fracture repair, upon returning home he had difficulty with transfer from regular bed to wheelchair and back. Additionally he has dx of severe COPD and will benefit from elevated head of bed >30 degrees. Home health and patient have requested hospital bed, and have sent order.  Denies fever, chills, worsening dyspnea, productive cough, new fall, worsening pain, numbness tingling weakness.   Depression screen [facility] 2/9 04/19/2017 10/27/2015 04/02/2015  Decreased Interest 0 0 3  Down, Depressed, Hopeless 0 0 3  PHQ - 2 Score 0 0 6  Altered sleeping - - 3  Tired, decreased energy - - 3  Change in appetite - - 3  Feeling bad or failure about yourself  - - 3  Trouble concentrating - - 3  Moving slowly or fidgety/restless - - 3  Suicidal thoughts - - 3  PHQ-9 Score - - 27  Difficult doing work/chores - - Extremely dIfficult    Social History   Tobacco Use  . Smoking status: Current Every Day Smoker    Packs/day: 0.50    Years: 45.00    Pack years: 22.50  Types: Cigarettes  . Smokeless tobacco: Current User  . Tobacco comment: smoking 10-15 cigs daily   Substance Use Topics  . Alcohol use: No    Alcohol/week: 0.0 oz  . Drug use: No    Review of Systems  Constitutional: Negative for activity change, appetite change, chills, diaphoresis, fatigue, fever and unexpected weight change.  HENT: Negative for congestion and hearing loss.   Eyes: Negative for visual disturbance.  Respiratory: Positive for cough. Negative for apnea, choking, chest tightness,  shortness of breath and wheezing.   Cardiovascular: Negative for chest pain, palpitations and leg swelling.  Gastrointestinal: Negative for abdominal pain, anal bleeding, blood in stool, constipation, diarrhea, nausea and vomiting.  Endocrine: Negative for cold intolerance.  Genitourinary: Negative for decreased urine volume, difficulty urinating, dysuria, frequency, hematuria and urgency.  Musculoskeletal: Positive for arthralgias. Negative for neck pain.  Skin: Negative for rash.  Allergic/Immunologic: Negative for environmental allergies.  Neurological: Negative for dizziness, seizures, weakness, light-headedness, numbness and headaches.  Hematological: Negative for adenopathy.  Psychiatric/Behavioral: Positive for behavioral problems and dysphoric mood. Negative for self-injury, sleep disturbance and suicidal ideas. The patient is not nervous/anxious.    Per HPI unless specifically indicated above     Objective:    BP 138/77   Pulse (!) 108   Temp 98.4 F (36.9 C) (Oral)   Ht 5\' 8"  (1.727 m)   Wt 130 lb (59 kg)   SpO2 100%   BMI 19.77 kg/m   Wt Readings from Last 3 Encounters:  11/11/17 130 lb (59 kg)  10/21/17 130 lb (59 kg)  10/13/17 130 lb 11.7 oz (59.3 kg)    Physical Exam  Constitutional: He is oriented to person, place, and time. No distress.  Chronically ill-appearing, mostly comfortable, cooperative, electric wheelchair, thin cachectic appearance  HENT:  Head: Normocephalic and atraumatic.  Mouth/Throat: Oropharynx is clear and moist.  Not wearing any supplemental oxygen.  Poor dentition  Eyes: EOM are normal. Right eye exhibits no discharge. Left eye exhibits no discharge.  R eye with slight conjunctival injection without drainage or discharge. No peri orbital erythema or swelling.  Neck: Normal range of motion. Neck supple.  Cardiovascular: Normal rate, regular rhythm, normal heart sounds and intact distal pulses.  No murmur heard. Pulmonary/Chest: No  respiratory distress. He has no wheezes. He has no rales.  Unchanged reduced air movement diffusely, some reduced respiratory effort. Occasional cough.  Musculoskeletal: He exhibits no edema.  Neurological: He is alert and oriented to person, place, and time.  Skin: Skin is warm and dry. No rash noted. He is not diaphoretic. No erythema.  Thick dry skin  Psychiatric: He has a normal mood and affect. His behavior is normal.  Appears poorly groomed, good eye contact, normal speech and thoughts. Limited verbal.  Nursing note and vitals reviewed.  Results for orders placed or performed during the hospital encounter of 10/21/17  CBC with Differential  Result Value Ref Range   WBC 4.7 3.8 - 10.6 K/uL   RBC 2.84 (L) 4.40 - 5.90 MIL/uL   Hemoglobin 8.1 (L) 13.0 - 18.0 g/dL   HCT 24.5 (L) 40.0 - 52.0 %   MCV 86.5 80.0 - 100.0 fL   MCH 28.6 26.0 - 34.0 pg   MCHC 33.0 32.0 - 36.0 g/dL   RDW 15.5 (H) 11.5 - 14.5 %   Platelets 105 (L) 150 - 440 K/uL   Neutrophils Relative % 60 %   Lymphocytes Relative 24 %   Monocytes Relative 12 %  Eosinophils Relative 2 %   Basophils Relative 1 %   Band Neutrophils 0 %   Metamyelocytes Relative 1 %   Myelocytes 0 %   Promyelocytes Absolute 0 %   Blasts 0 %   nRBC 0 0 /100 WBC   Other 0 %   Neutro Abs 2.9 1.4 - 6.5 K/uL   Lymphs Abs 1.1 1.0 - 3.6 K/uL   Monocytes Absolute 0.6 0.2 - 1.0 K/uL   Eosinophils Absolute 0.1 0 - 0.7 K/uL   Basophils Absolute 0.0 0 - 0.1 K/uL   Smear Review MORPHOLOGY UNREMARKABLE   Comprehensive metabolic panel  Result Value Ref Range   Sodium 127 (L) 135 - 145 mmol/L   Potassium 4.4 3.5 - 5.1 mmol/L   Chloride 91 (L) 101 - 111 mmol/L   CO2 25 22 - 32 mmol/L   Glucose, Bld 144 (H) 65 - 99 mg/dL   BUN 10 6 - 20 mg/dL   Creatinine, Ser 0.84 0.61 - 1.24 mg/dL   Calcium 8.2 (L) 8.9 - 10.3 mg/dL   Total Protein 6.3 (L) 6.5 - 8.1 g/dL   Albumin 2.8 (L) 3.5 - 5.0 g/dL   AST 16 15 - 41 U/L   ALT 6 (L) 17 - 63 U/L    Alkaline Phosphatase 65 38 - 126 U/L   Total Bilirubin 0.4 0.3 - 1.2 mg/dL   GFR calc non Af Amer >60 >60 mL/min   GFR calc Af Amer >60 >60 mL/min   Anion gap 11 5 - 15  Type and screen [place]  Result Value Ref Range   ABO/RH(D) A POS    Antibody Screen NEG    Sample Expiration      10/24/2017 Performed at [facility], [address] [place], [place] [address]       Assessment & Plan:   Problem List Items Addressed This Visit    Bipolar 1 disorder, depressed ([place]) - Primary    Previuosly managed by [facility]iatry Provide refill today on Depakote and Gabapentin Recommend return to Psych as needed      Relevant Medications   gabapentin (NEURONTIN) 600 MG tablet   divalproex (DEPAKOTE) 500 MG DR tablet   Central pain syndrome (CPS) (Chronic)    Stable chronic problem Refill Gabapentin Off Baclofen since had recurrent falls and sedation Follow-up with Palliative Care as advised - recommended to re-schedule prior missed initial consult - discuss pain management among symptom control. He was on oxy IR 5 per ortho post op for hip and at SNF, however discontinue this today until further advice from Palliative      Relevant Medications   gabapentin (NEURONTIN) 600 MG tablet   divalproex (DEPAKOTE) 500 MG DR tablet   Chronic respiratory failure (HCC)    End Stage COPD with complication chronic hypercapnic respiratory failure on supplemental O2, currently does not have O2 - Has used at home in past, supplies through [facility] - Was followed by [facility], has not returned in >1 year now  Recommend follow back up with Pulmonology for COPD      COPD (chronic obstructive pulmonary disease) with emphysema ([place])    Without acute flare today, recovered from recent HCAP Active smoker - End Stage COPD with complication chronic hypercapnic respiratory failure on supplemental O2, currently does not have O2 - Has used at home in past, supplies  through [facility] - Was followed by [facility], has not returned in >1 year now  Order hospital bed  per [facility], would benefit from head of bed elevated >30 degrees most of time due to severe COPD to reduce complications from secretions and respiratory status  Recommend follow back up with Pulmonology for COPD      Palliative care patient    Follow-up with Palliative Care as advised - recommended to re-schedule prior missed initial consult - discuss pain management among symptom control. He was on oxy IR 5 per ortho post op for hip and at SNF, however discontinue this today until further advice from Palliative      Right 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) ([place]) (Chronic)    Chronic problem R sided weakness, secondary to prior CVA Complicated by deconditioning and significant weight loss, with End Stage COPD, smoker  Depends on electric wheelchair for mobility  Limited with transfer to bed, would benefit from hospital bed order to assist with appropriate body position and transfer. Recommend hospital bed for frequent and immediate need to change body position.  Additoinally recommend gel overlay order for hospital bed to avoid pressure ulcer and complications from his significantly limited mobility due to hemiparesis among other conditions s/p R hip surgery and deconditioning. He has impaired nutritional status with End Stage COPD and protein calorie malnutrition       Other Visit Diagnoses    S/P right hip fracture       Recommend hospital bed to assist with post-op pain and limited mobility, improve transfer to electric wheelchair      ------------------  I have reviewed the discharge medication list, and have reconciled the current and discharge medications today.   Current Outpatient Medications:  .  albuterol (PROVENTIL) (2.5 MG/3ML) 0.083% nebulizer solution, Take 3 mLs (2.5 mg total) by nebulization every 6 (six) hours as  needed for wheezing or shortness of breath., Disp: 75 mL, Rfl: 12 .  aspirin EC 81 MG tablet, Take 81 mg by mouth daily., Disp: , Rfl:  .  atorvastatin (LIPITOR) 40 MG tablet, TAKE 1 TABLET BY MOUTH DAILY., Disp: 90 tablet, Rfl: 0 .  bisacodyl (DULCOLAX) 10 MG suppository, Place 1 suppository (10 mg total) rectally daily as needed for moderate constipation., Disp: 12 suppository, Rfl: 0 .  Brexpiprazole (REXULTI) 2 MG TABS, Take 4 mg by mouth at bedtime. , Disp: , Rfl:  .  cefdinir (OMNICEF) 300 MG capsule, Take 1 capsule (300 mg total) by mouth 2 (two) times daily., Disp: 10 capsule, Rfl: 0 .  divalproex (DEPAKOTE) 500 MG DR tablet, Take 1 tablet (500 mg total) by mouth 2 (two) times daily., Disp: 60 tablet, Rfl: 2 .  docusate sodium (COLACE) 100 MG capsule, Take 1 capsule (100 mg total) by mouth 2 (two) times daily., Disp: 10 capsule, Rfl: 0 .  DULoxetine (CYMBALTA) 30 MG capsule, Take 90 mg by mouth daily., Disp: , Rfl:  .  gabapentin (NEURONTIN) 600 MG tablet, Take 1 tablet (600 mg total) by mouth 3 (three) times daily., Disp: 90 tablet, Rfl: 2 .  ipratropium-albuterol (DUONEB) 0.5-2.5 (3) MG/3ML SOLN, Take 3 mLs by nebulization 4 (four) times daily., Disp: 360 mL, Rfl: 0 .  mupirocin ointment (BACTROBAN) 2 %, Place 1 application into the nose 2 (two) times daily., Disp: 22 g, Rfl: 0 .  omeprazole (PRILOSEC) 20 MG capsule, TAKE 1 CAPSULE BY MOUTH TWICE DAILY, Disp: 180 capsule, Rfl: 0 .  potassium chloride SA (K-DUR,KLOR-CON) 20 MEQ tablet, Take 1 tablet (20 mEq total) by mouth daily., Disp: 30 tablet, Rfl: 5 .  PROAIR HFA 108 (90 Base) MCG/ACT inhaler, INHALE 2 PUFFS BY MOUTH INTO THE LUNGS FOUR TIMES DAILY AS NEEDED, Disp: 8.5 g, Rfl: 0 .  sodium chloride 1 g tablet, Take 2 tablets (2 g total) by mouth 3 (three) times daily with meals., Disp: 14 tablet, Rfl: 0 .  trazodone (DESYREL) 300 MG tablet, Take 300 mg by mouth at bedtime. Reported on 10/27/2015, Disp: , Rfl:  .  guaiFENesin (MUCINEX)  600 MG 12 hr tablet, Take 1 tablet (600 mg total) by mouth 2 (two) times daily. (Patient not taking: Reported on 11/11/2017), Disp: 60 tablet, Rfl: 2   Meds ordered this encounter  Medications  . gabapentin (NEURONTIN) 600 MG tablet    Sig: Take 1 tablet (600 mg total) by mouth 3 (three) times daily.    Dispense:  90 tablet    Refill:  2  . divalproex (DEPAKOTE) 500 MG DR tablet    Sig: Take 1 tablet (500 mg total) by mouth 2 (two) times daily.    Dispense:  60 tablet    Refill:  2     Follow up plan: Return in about 3 months (around 02/10/2018) for COPD, Bipolar med adjust.  [name], DO [facility] 11/12/2017, 12:48 AM

## 2017-11-11 NOTE — Patient Instructions (Addendum)
Thank you for coming to the office today.  Before we can resume pain medicine such as Oxycodone - need to have him evaluated by Palliative Care, they will help make recommendations for pain control  [facility] Address: [address], [place], [place] [address] Phone: [phone] (Main office # - call for general questions and learn more about services)  Once they see you, they will notify us and we can try to rx any recommended medications  Refilled Depakote and Gabapentin today  Reduce Aspirin from 325 down to 81  Stop Baclofen  Remain off blood pressure medicine  We will order Hospital Bed through [facility] and add Calcium + Vitamin D  Recommend Ensure or Boost mealtime protein supplement 1-2 times daily  Please schedule a Follow-up Appointment to: Return in about 3 months (around 02/10/2018) for COPD, Bipolar med adjust.  If you have any other questions or concerns, please feel free to call the office or send a message through [place]. You may also schedule an earlier appointment if necessary.  Additionally, you may be receiving a survey about your experience at our office within a few days to 1 week by e-mail or mail. We value your feedback.  [name], DO [facility]

## 2017-11-12 ENCOUNTER — Encounter: Payer: Self-pay | Admitting: Family Medicine

## 2017-11-12 DIAGNOSIS — Z515 Encounter for palliative care: Secondary | ICD-10-CM | POA: Insufficient documentation

## 2017-11-12 NOTE — Assessment & Plan Note (Signed)
Stable chronic problem Refill Gabapentin Off Baclofen since had recurrent falls and sedation Follow-up with Palliative Care as advised - recommended to re-schedule prior missed initial consult - discuss pain management among symptom control. He was on oxy IR 5 per ortho post op for hip and at SNF, however discontinue this today until further advice from Palliative

## 2017-11-12 NOTE — Assessment & Plan Note (Addendum)
Chronic problem R sided weakness, secondary to prior CVA Complicated by deconditioning and significant weight loss, with End Stage COPD, smoker  Depends on electric wheelchair for mobility  Limited with transfer to bed, would benefit from hospital bed order to assist with appropriate body position and transfer. Recommend hospital bed for frequent and immediate need to change body position.  Additoinally recommend gel overlay order for hospital bed to avoid pressure ulcer and complications from his significantly limited mobility due to hemiparesis among other conditions s/p R hip surgery and deconditioning. He has impaired nutritional status with End Stage COPD and protein calorie malnutrition

## 2017-11-12 NOTE — Assessment & Plan Note (Signed)
Follow-up with Palliative Care as advised - recommended to re-schedule prior missed initial consult - discuss pain management among symptom control. He was on oxy IR 5 per ortho post op for hip and at SNF, however discontinue this today until further advice from Palliative

## 2017-11-12 NOTE — Assessment & Plan Note (Signed)
Previuosly managed by [facility]iat[facility] Provide refill today on Depakote and Gabapentin Recommend return to Psych as needed

## 2017-11-12 NOTE — Assessment & Plan Note (Addendum)
Without acute flare today, recovered from recent HCAP Active smoker - End Stage COPD with complication chronic hypercapnic respiratory failure on supplemental O2, currently does not have O2 - Has used at home in past, supplies through [facility] - Was followed by [facility], has not returned in >1 year now  Order hospital bed per [facility], would benefit from head of bed elevated >30 degrees most of time due to severe COPD to reduce complications from secretions and respiratory status  Recommend follow back up with Pulmonology for COPD

## 2017-11-12 NOTE — Assessment & Plan Note (Signed)
End Stage COPD with complication chronic hypercapnic respiratory failure on supplemental O2, currently does not have O2 - Has used at home in past, supplies through [facility] - Was followed by [facility], has not returned in >1 year now  Recommend follow back up with Pulmonology for COPD

## 2017-11-17 ENCOUNTER — Telehealth: Payer: Self-pay | Admitting: Family Medicine

## 2017-11-17 NOTE — Telephone Encounter (Signed)
Home health called states that pt BP was 180/84, 10 minutes later BP was 140/70 >>> Pt also  Have met OT goals pt decline  Visit for tomorrow

## 2017-11-17 NOTE — Telephone Encounter (Signed)
Acknowledged.  Follow-up as planned.  [name], [facility] 11/17/2017, 3:33 PM

## 2017-11-18 ENCOUNTER — Telehealth: Payer: Self-pay | Admitting: Family Medicine

## 2017-11-18 DIAGNOSIS — N3000 Acute cystitis without hematuria: Secondary | ICD-10-CM

## 2017-11-18 MED ORDER — CEPHALEXIN 500 MG PO CAPS: 500.0000 mg | ORAL_CAPSULE | Freq: Three times a day (TID) | ORAL | 0 refills | Status: DC

## 2017-11-18 NOTE — Telephone Encounter (Signed)
Received call from patient's caregiver, with concern for possible UTI. He is significantly debilitated, unable to get into office today. He was seen recently within past 1 week. I agree for one time coverage antibiotic empiric coverage, sent to pharmacy. If he is not improving he should come in to office or hospital ED for further evaluation.  [name], [facility] 11/18/2017, 1:26 PM

## 2017-11-21 ENCOUNTER — Ambulatory Visit: Payer: Medicare Other | Admitting: Pulmonary Disease

## 2017-11-24 ENCOUNTER — Telehealth: Payer: Self-pay | Admitting: Family Medicine

## 2017-11-24 NOTE — Telephone Encounter (Signed)
That is fine. I'm not exactly sure how they need the order. We can put a referral to outpatient PT in Epic. Or we can do a Handwritten rx pad, for Outpatient PT and list diagnosis codes.  [name], [facility] 11/24/2017, 3:39 PM

## 2017-11-24 NOTE — Telephone Encounter (Signed)
David Hall with [facility] said pt was discharged from home health today.  She asked to have an order to continue as outpatient PT faxed to [facility] with diagnosis history of CVA and R hip fracture.  Her call back number is [phone]

## 2017-11-28 ENCOUNTER — Telehealth: Payer: Self-pay | Admitting: Family Medicine

## 2017-11-28 NOTE — Telephone Encounter (Signed)
Advised as per Dr. Raliegh Ip that patient has to follow their recommendation first for outpatient PT and fail at least once to change from out patient PT to home health PT. She understood very well and try to schedule an appointment.

## 2017-11-28 NOTE — Telephone Encounter (Signed)
David Hall asked to have PT come back to house because pt has difficulty walking [phone]

## 2017-11-29 ENCOUNTER — Other Ambulatory Visit: Payer: Self-pay | Admitting: Family Medicine

## 2017-11-29 DIAGNOSIS — Z8781 Personal history of (healed) traumatic fracture: Secondary | ICD-10-CM

## 2017-11-29 NOTE — Telephone Encounter (Signed)
Left message twice yesterday and today for [facility]

## 2017-11-29 NOTE — Telephone Encounter (Signed)
Order has been placed.

## 2017-11-30 ENCOUNTER — Telehealth: Payer: Self-pay | Admitting: Family Medicine

## 2017-11-30 NOTE — Telephone Encounter (Signed)
Megan from [facility] called today to review this patient's recent record over past 1 year and recent course, with the case that was opened and complaints about self neglect.  She states they sent a fax for records, but our staff was unable to locate this and we did not receive records request to best of my knowledge. We discussed verbally his clinical course with regards to diagnoses End Stage COPD, History of CVA w/ residual deficits weakness hemiplegia, prior hip fracture and repair in 2019, debilitation from his chronic ailments, problems with mobility uses electric wheelchair often. Previously I have attempted to get him evaluated by hospice but he did not meet criteria back in 2018. Recently we have attempted palliative care referral but they did not initiate care, and he was actually admitted to hospital with hip fracture, then stayed at [facility] for rehab, I saw him last 11/11/17 see record for detailed clinical information, we recommended recontact and re-schedule palliative care. His primary caregiver is Sandrea Hughs, who comes to his appointments. I expressed that I do have concerns that given his significant chronic illnesses he has required higher level of care then he may be receiving, and we have discussed possibilities such as more comfort or palliative care as well.  No further concerns or questions. They will contact us back if need copy of record.  [name], [facility] 11/30/2017, 10:35 AM

## 2017-12-02 ENCOUNTER — Other Ambulatory Visit: Payer: Self-pay | Admitting: Family Medicine

## 2017-12-02 DIAGNOSIS — J432 Centrilobular emphysema: Secondary | ICD-10-CM

## 2017-12-05 ENCOUNTER — Telehealth: Payer: Self-pay | Admitting: Family Medicine

## 2017-12-05 ENCOUNTER — Other Ambulatory Visit: Payer: Self-pay | Admitting: Family Medicine

## 2017-12-05 DIAGNOSIS — I129 Hypertensive chronic kidney disease with stage 1 through stage 4 chronic kidney disease, or unspecified chronic kidney disease: Secondary | ICD-10-CM

## 2017-12-05 DIAGNOSIS — E785 Hyperlipidemia, unspecified: Secondary | ICD-10-CM

## 2017-12-05 DIAGNOSIS — K219 Gastro-esophageal reflux disease without esophagitis: Secondary | ICD-10-CM

## 2017-12-05 DIAGNOSIS — N182 Chronic kidney disease, stage 2 (mild): Secondary | ICD-10-CM

## 2017-12-05 NOTE — Telephone Encounter (Signed)
I received several refill requests today. I attempted to call patient about the meds but did not reach them.  I have declined refills of Metoprolol, Isosorbide, and combo pill Amlodipine/Valsartan/HCTZ as patient was not taking these last time I saw him and he has been taken off several meds from hospital.  He was followed by [facility] Dr Clayborn Bigness.  I do not plan to re-start these BP and Heart medications. He should schedule follow-up with his Cardiologist to determine if any of these meds need to be restarted  [name], [facility] 12/05/2017, 12:59 PM

## 2017-12-05 NOTE — Telephone Encounter (Signed)
Advised patient

## 2018-01-26 ENCOUNTER — Encounter: Payer: Self-pay | Admitting: Pulmonary Disease

## 2018-02-04 ENCOUNTER — Other Ambulatory Visit: Payer: Self-pay | Admitting: Family Medicine

## 2018-02-07 ENCOUNTER — Ambulatory Visit: Payer: Self-pay | Admitting: Family Medicine

## 2018-02-07 ENCOUNTER — Other Ambulatory Visit: Payer: Self-pay | Admitting: Family Medicine

## 2018-02-07 DIAGNOSIS — N182 Chronic kidney disease, stage 2 (mild): Principal | ICD-10-CM

## 2018-02-07 DIAGNOSIS — E876 Hypokalemia: Secondary | ICD-10-CM

## 2018-02-07 DIAGNOSIS — I129 Hypertensive chronic kidney disease with stage 1 through stage 4 chronic kidney disease, or unspecified chronic kidney disease: Secondary | ICD-10-CM

## 2018-02-13 ENCOUNTER — Other Ambulatory Visit: Payer: Self-pay | Admitting: Family Medicine

## 2018-02-13 ENCOUNTER — Encounter: Payer: Self-pay | Admitting: Family Medicine

## 2018-02-13 ENCOUNTER — Other Ambulatory Visit: Payer: Self-pay

## 2018-02-13 ENCOUNTER — Ambulatory Visit (INDEPENDENT_AMBULATORY_CARE_PROVIDER_SITE_OTHER): Payer: Medicare Other | Admitting: Family Medicine

## 2018-02-13 VITALS — BP 137/85 | HR 90 | Resp 16 | Ht 68.0 in | Wt 119.0 lb

## 2018-02-13 DIAGNOSIS — I69359 Hemiplegia and hemiparesis following cerebral infarction affecting unspecified side: Secondary | ICD-10-CM

## 2018-02-13 DIAGNOSIS — E43 Unspecified severe protein-calorie malnutrition: Secondary | ICD-10-CM | POA: Insufficient documentation

## 2018-02-13 DIAGNOSIS — I129 Hypertensive chronic kidney disease with stage 1 through stage 4 chronic kidney disease, or unspecified chronic kidney disease: Secondary | ICD-10-CM

## 2018-02-13 DIAGNOSIS — E538 Deficiency of other specified B group vitamins: Secondary | ICD-10-CM

## 2018-02-13 DIAGNOSIS — D509 Iron deficiency anemia, unspecified: Secondary | ICD-10-CM

## 2018-02-13 DIAGNOSIS — R531 Weakness: Secondary | ICD-10-CM | POA: Diagnosis not present

## 2018-02-13 DIAGNOSIS — N182 Chronic kidney disease, stage 2 (mild): Secondary | ICD-10-CM

## 2018-02-13 DIAGNOSIS — J9612 Chronic respiratory failure with hypercapnia: Secondary | ICD-10-CM | POA: Diagnosis not present

## 2018-02-13 DIAGNOSIS — M6259 Muscle wasting and atrophy, not elsewhere classified, multiple sites: Secondary | ICD-10-CM | POA: Insufficient documentation

## 2018-02-13 DIAGNOSIS — J432 Centrilobular emphysema: Secondary | ICD-10-CM | POA: Diagnosis not present

## 2018-02-13 DIAGNOSIS — R739 Hyperglycemia, unspecified: Secondary | ICD-10-CM

## 2018-02-13 NOTE — Progress Notes (Addendum)
Subjective:    Patient ID: David Hall., male    DOB: 03/02/1956, 62 y.o.   MRN: [id number]  David Hall. is a 62 y.o. male presenting on 02/13/2018 for Weight Loss (Requesting Home PT/OT if possible due to transportation.119 lb today. Live in friend concerned with weakness and refusal to be more motivated. Can stand to use bathroom but will not go to bathroom usually uses urinal in bed and then spills it and is lying in urine. He feesl weak and states he can not get up due to energy loss. Eats 3 meals and snacks a day.Friend states he is lying down to eat in bed and lying down all day long now. ) and Hip Pain (R hip hurting last PT was back in March and he is not doing home exercize)  History provided by patient and also accompanied by primary caregiver/friend Sandrea Hughs  HPI   Weight Loss / Generalized Muscle Atrophy and Weakness / Severe Protein Calorie Malnutrition Chronic R Hip Pain s/p fracture / Severe COPD - Last visit with me 11/2017, for hospital follow-up for x 2 hospitalizations and SNF for R hip fracture from fall and hypoxia and HCAP, severe COPD, see prior notes for background information. - Interval update with patient was referred to [facility] and he did well for several weeks to month and was gradually improving activity and strength but then once this service completed he declined again. He never was able to get Hospital Bed DME from medicaid due to insurance was dropped by their report and difficulty with financial. Regarding Palliative patient/caregiver state that they never returned to set up arrangements since last visit.  - Today patient and his caregiver report that he is having difficulty with mobility again due to pain and also due to decreased effort on his part. He remains mostly bedbound and in wheelchair, still not ambulating as much, seems to have declined clinically since last visit again  - He is able to transfer to and from bed, but he has  difficulty sitting up and often he is preferring to lay down and eat and not be as active, he is losing muscle mass and weight and he complains of worsening weakness unable to sit up well and does not get up to use bathroom, often he has incontinence and difficulty - Down 11 lbs in 3 months - He admits to eating good meals and not having problem with nutrition, takes supplement PRN - Limited by transportation cannot get to office often or hospital - Previously followed by [facility] when we ordered it back in 11/2017, and he benefited from [facility] PT and he has tried to do some home exercise - he is asking about pain medicine, and would like to pursue Palliative Care to see if any recommendations to increase his function at home to become more active Denies dyspnea, chest pain, fever chills productive cough, nausea vomiting   Additional concerns: - History of anemia - onset after hospitalization 09/2017 after hip surgery, he has improved nutrition, and they have started iron pills without significant changes. He denies dark stool or rectal bleeding  Depression screen [facility] 2/9 02/13/2018 04/19/2017 10/27/2015  Decreased Interest 0 0 0  Down, Depressed, Hopeless 0 0 0  PHQ - 2 Score 0 0 0  Altered sleeping 0 - -  Tired, decreased energy 3 - -  Change in appetite 0 - -  Feeling bad or failure about yourself  0 - -  Trouble concentrating 0 - -  Moving slowly or fidgety/restless 0 - -  Suicidal thoughts 0 - -  PHQ-9 Score 3 - -  Difficult doing work/chores - - -    Social History   Tobacco Use  . Smoking status: Current Every Day Smoker    Packs/day: 0.50    Years: 45.00    Pack years: 22.50    Types: Cigarettes  . Smokeless tobacco: Current User  . Tobacco comment: smoking 10-15 cigs daily   Substance Use Topics  . Alcohol use: No    Alcohol/week: 0.0 oz  . Drug use: No    Review of Systems Per HPI unless specifically indicated above     Objective:    BP 137/85   Pulse 90    Resp 16   Ht 5\' 8"  (1.727 m)   Wt 119 lb (54 kg)   SpO2 98%   BMI 18.09 kg/m   Wt Readings from Last 3 Encounters:  02/13/18 119 lb (54 kg)  11/11/17 130 lb (59 kg)  10/21/17 130 lb (59 kg)    Physical Exam  Constitutional: He is oriented to person, place, and time. No distress.  Chronically ill-appearing, mostly comfortable, cooperative, manual wheelchair, thin cachectic appearance  HENT:  Head: Normocephalic and atraumatic.  Mouth/Throat: Oropharynx is clear and moist.  Not wearing any supplemental oxygen.  Poor dentition  Eyes: EOM are normal. Right eye exhibits no discharge. Left eye exhibits no discharge.  Neck: Normal range of motion. Neck supple.  Cardiovascular: Normal rate, regular rhythm, normal heart sounds and intact distal pulses.  No murmur heard. Pulmonary/Chest: No respiratory distress. He has no wheezes. He has no rales.  Mildly reduced air movement diffusely similar at baseline, no focal crackles or wheezing.  Musculoskeletal: He exhibits no edema.  Significant muscle atrophy bilateral upper and lower extremities, trunk, and facial temple regions  Right upper extremity significant hemiparesis with weakness unable to lift on it's own uses other arm to support. Right lower extremity able to lift partially on his own  Neurological: He is alert and oriented to person, place, and time.  Skin: Skin is warm and dry. No rash noted. He is not diaphoretic. No erythema.  Thick dry skin  Psychiatric: He has a normal mood and affect. His behavior is normal.  Grooming slightly improved since las tvisit, good eye contact, normal speech and thoughts. Limited verbal.  Nursing note and vitals reviewed.  Results for orders placed or performed during the hospital encounter of 10/21/17  CBC with Differential  Result Value Ref Range   WBC 4.7 3.8 - 10.6 K/uL   RBC 2.84 (L) 4.40 - 5.90 MIL/uL   Hemoglobin 8.1 (L) 13.0 - 18.0 g/dL   HCT 24.5 (L) 40.0 - 52.0 %   MCV 86.5 80.0 -  100.0 fL   MCH 28.6 26.0 - 34.0 pg   MCHC 33.0 32.0 - 36.0 g/dL   RDW 15.5 (H) 11.5 - 14.5 %   Platelets 105 (L) 150 - 440 K/uL   Neutrophils Relative % 60 %   Lymphocytes Relative 24 %   Monocytes Relative 12 %   Eosinophils Relative 2 %   Basophils Relative 1 %   Band Neutrophils 0 %   Metamyelocytes Relative 1 %   Myelocytes 0 %   Promyelocytes Absolute 0 %   Blasts 0 %   nRBC 0 0 /100 WBC   Other 0 %   Neutro Abs 2.9 1.4 - 6.5 K/uL  Lymphs Abs 1.1 1.0 - 3.6 K/uL   Monocytes Absolute 0.6 0.2 - 1.0 K/uL   Eosinophils Absolute 0.1 0 - 0.7 K/uL   Basophils Absolute 0.0 0 - 0.1 K/uL   Smear Review MORPHOLOGY UNREMARKABLE   Comprehensive metabolic panel  Result Value Ref Range   Sodium 127 (L) 135 - 145 mmol/L   Potassium 4.4 3.5 - 5.1 mmol/L   Chloride 91 (L) 101 - 111 mmol/L   CO2 25 22 - 32 mmol/L   Glucose, Bld 144 (H) 65 - 99 mg/dL   BUN 10 6 - 20 mg/dL   Creatinine, Ser 0.84 0.61 - 1.24 mg/dL   Calcium 8.2 (L) 8.9 - 10.3 mg/dL   Total Protein 6.3 (L) 6.5 - 8.1 g/dL   Albumin 2.8 (L) 3.5 - 5.0 g/dL   AST 16 15 - 41 U/L   ALT 6 (L) 17 - 63 U/L   Alkaline Phosphatase 65 38 - 126 U/L   Total Bilirubin 0.4 0.3 - 1.2 mg/dL   GFR calc non Af Amer >60 >60 mL/min   GFR calc Af Amer >60 >60 mL/min   Anion gap 11 5 - 15  Type and screen [facility]  Result Value Ref Range   ABO/RH(D) A POS    Antibody Screen NEG    Sample Expiration      10/24/2017 Performed at [facility], 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 Now with worsening weight loss due to worsening muscle atrophy and generalized weakness, seems multifactorial likely related to chronic diseases also some nutrition component but seems to be taking appropriate PO by report, biggest concern is lack of mobility mostly bedbound and due to pain and his own effort seems to be perpetuating the problem. Improves temporarily with [facility] PT, he has limited access to other care or support at this time, limited options. - Of note prior APS case was opened and evaluated - Previous company [facility], he did well with this back in 11/2017 No evidence of Acute COPD flare, resp status seems stable  Plan Discussion again on concerns for his long-term poor prognosis and difficulty of managing his problems if he is not increasing activity and trying to avoid or limit muscle wasting. - Will attempt to repeat referral to [facility] and Social Worker for further evaluation, again he may be candidate for future ALF or SNF if continues to require higher acuity of care again in future - Also will repeat referral to [facility] - this was initiated back in 09/2017 but never started due to patient in hospital. Patient may benefit from goals of care discussion and possibly palliative medication recommendations for pain to improve his mobility, also may monitor for  possible progression to Hospice criteria - Follow-up within 3 months sooner if need to monitor progress, will check labs as well  Additionally: Patient will require use of wheelchair for mobility as mentioned above, due to his chronic generalized weakness and muscle atrophy he is unable to use a cane or walker safely  due to high risk of fall and injury. He is able to safely propel the wheelchair in home at times otherwise, he does have a primary caregiver/family support to help push and provide assistance when needed. He has been evaluated by home health and had safety evaluation of the home for assisted devices. It is requested that patient receive a light weight wheelchair because he cannot push a heavier or standard weight wheelchair due to generalized weakness including upper extremity.  No orders of the defined types were placed in this encounter.  Orders Placed This Encounter  Procedures  . Ambulatory referral to Home Health    Referral Priority:   Routine    Referral Type:   Home Health Care    Referral Reason:   Specialty Services Required    Requested Specialty:   [place]    Number of Visits Requested:   1    Follow up plan: Return in about 3 months (around 05/16/2018) for COPD, Muscle Atrophy.  Future labs ordered for 05/09/18  [name], [facility] 02/13/2018, 5:30 PM

## 2018-02-13 NOTE — Patient Instructions (Addendum)
Thank you for coming to the office today.  We will try to resume referral for Palliative Care - they can make recommendation for pain medicine if it is needed  [facility] Address: [address], [place], [place] [address] Phone: [phone] (Main office # - call for general questions and learn more about services)  Referral back to [facility] for [place] stay tuned  DUE for FASTING BLOOD WORK (no food or drink after midnight before the lab appointment, only water or coffee without cream/sugar on the morning of)  SCHEDULE "Lab Only" visit in the morning at the clinic for lab draw in 3 MONTHS   - Make sure Lab Only appointment is at about 1 week before your next appointment, so that results will be available  For Lab Results, once available within 2-3 days of blood draw, you can can log in to MyChart online to view your results and a brief explanation. Also, we can discuss results at next follow-up visit.   Please schedule a Follow-up Appointment to: Return in about 3 months (around 05/16/2018) for COPD, Muscle Atrophy.  If you have any other questions or concerns, please feel free to call the office or send a message through [facility]. You may also schedule an earlier appointment if necessary.  Additionally, you may be receiving a survey about your experience at our office within a few days to 1 week by e-mail or mail. We value your feedback.  [name], DO [place]

## 2018-02-16 ENCOUNTER — Telehealth: Payer: Self-pay | Admitting: Family Medicine

## 2018-02-16 NOTE — Telephone Encounter (Signed)
David Hall with [facility] needs a verbal for nursing visit once a week for 4 weeks for medication and diet  [phone]

## 2018-02-17 NOTE — Telephone Encounter (Signed)
Verbal given 

## 2018-02-20 ENCOUNTER — Other Ambulatory Visit: Payer: Self-pay | Admitting: Family Medicine

## 2018-02-20 ENCOUNTER — Telehealth: Payer: Self-pay | Admitting: Family Medicine

## 2018-02-20 DIAGNOSIS — G890 Central pain syndrome: Secondary | ICD-10-CM

## 2018-02-20 NOTE — Telephone Encounter (Signed)
Gerald Stabs with [facility] needs a verbal for PT twice a week for 3 weeks [phone]

## 2018-02-20 NOTE — Telephone Encounter (Signed)
Attempted to call [facility] back, left voicemail for him to give my verbal authorization for proceeding with PT twice a week for 3 weeks.  [name], [facility] 02/20/2018, 4:52 PM

## 2018-02-23 ENCOUNTER — Telehealth: Payer: Self-pay | Admitting: Family Medicine

## 2018-02-23 NOTE — Telephone Encounter (Signed)
Please contact Izora Gala with [facility] to give my verbal authorization for proceeding with OT once a week for 3 weeks.  [name], [facility] 02/23/2018, 5:00 PM

## 2018-02-23 NOTE — Telephone Encounter (Signed)
David Hall with [name] needs verbal for OT once a week for 3 weeks [phone]

## 2018-02-24 NOTE — Telephone Encounter (Signed)
I left a detail message on David Hall w/ [facility] confidently vm that he can continue OT per Dr. Parks Ranger.

## 2018-03-02 ENCOUNTER — Other Ambulatory Visit: Payer: Self-pay | Admitting: Family Medicine

## 2018-03-02 DIAGNOSIS — J432 Centrilobular emphysema: Secondary | ICD-10-CM

## 2018-03-06 ENCOUNTER — Other Ambulatory Visit: Payer: Self-pay | Admitting: Family Medicine

## 2018-03-06 DIAGNOSIS — K219 Gastro-esophageal reflux disease without esophagitis: Secondary | ICD-10-CM

## 2018-03-06 DIAGNOSIS — E785 Hyperlipidemia, unspecified: Secondary | ICD-10-CM

## 2018-03-09 DIAGNOSIS — Z7401 Bed confinement status: Secondary | ICD-10-CM | POA: Diagnosis not present

## 2018-03-09 DIAGNOSIS — J449 Chronic obstructive pulmonary disease, unspecified: Secondary | ICD-10-CM | POA: Diagnosis not present

## 2018-03-09 DIAGNOSIS — E871 Hypo-osmolality and hyponatremia: Secondary | ICD-10-CM | POA: Diagnosis not present

## 2018-03-09 DIAGNOSIS — E7849 Other hyperlipidemia: Secondary | ICD-10-CM | POA: Diagnosis not present

## 2018-03-09 DIAGNOSIS — I10 Essential (primary) hypertension: Secondary | ICD-10-CM | POA: Diagnosis not present

## 2018-03-09 DIAGNOSIS — M25551 Pain in right hip: Secondary | ICD-10-CM | POA: Diagnosis not present

## 2018-03-09 DIAGNOSIS — K219 Gastro-esophageal reflux disease without esophagitis: Secondary | ICD-10-CM | POA: Diagnosis not present

## 2018-03-09 DIAGNOSIS — I69351 Hemiplegia and hemiparesis following cerebral infarction affecting right dominant side: Secondary | ICD-10-CM | POA: Diagnosis not present

## 2018-03-09 DIAGNOSIS — M6259 Muscle wasting and atrophy, not elsewhere classified, multiple sites: Secondary | ICD-10-CM | POA: Diagnosis not present

## 2018-03-09 DIAGNOSIS — E43 Unspecified severe protein-calorie malnutrition: Secondary | ICD-10-CM | POA: Diagnosis not present

## 2018-03-09 DIAGNOSIS — J9612 Chronic respiratory failure with hypercapnia: Secondary | ICD-10-CM | POA: Diagnosis not present

## 2018-03-09 DIAGNOSIS — Z7982 Long term (current) use of aspirin: Secondary | ICD-10-CM | POA: Diagnosis not present

## 2018-03-09 DIAGNOSIS — G8929 Other chronic pain: Secondary | ICD-10-CM | POA: Diagnosis not present

## 2018-03-09 DIAGNOSIS — M62838 Other muscle spasm: Secondary | ICD-10-CM | POA: Diagnosis not present

## 2018-03-15 DIAGNOSIS — M25551 Pain in right hip: Secondary | ICD-10-CM | POA: Diagnosis not present

## 2018-03-15 DIAGNOSIS — I10 Essential (primary) hypertension: Secondary | ICD-10-CM | POA: Diagnosis not present

## 2018-03-15 DIAGNOSIS — E7849 Other hyperlipidemia: Secondary | ICD-10-CM | POA: Diagnosis not present

## 2018-03-15 DIAGNOSIS — Z7401 Bed confinement status: Secondary | ICD-10-CM | POA: Diagnosis not present

## 2018-03-15 DIAGNOSIS — Z7982 Long term (current) use of aspirin: Secondary | ICD-10-CM | POA: Diagnosis not present

## 2018-03-15 DIAGNOSIS — E43 Unspecified severe protein-calorie malnutrition: Secondary | ICD-10-CM | POA: Diagnosis not present

## 2018-03-15 DIAGNOSIS — M6259 Muscle wasting and atrophy, not elsewhere classified, multiple sites: Secondary | ICD-10-CM | POA: Diagnosis not present

## 2018-03-15 DIAGNOSIS — J449 Chronic obstructive pulmonary disease, unspecified: Secondary | ICD-10-CM | POA: Diagnosis not present

## 2018-03-15 DIAGNOSIS — I69351 Hemiplegia and hemiparesis following cerebral infarction affecting right dominant side: Secondary | ICD-10-CM | POA: Diagnosis not present

## 2018-03-15 DIAGNOSIS — M62838 Other muscle spasm: Secondary | ICD-10-CM | POA: Diagnosis not present

## 2018-03-15 DIAGNOSIS — J9612 Chronic respiratory failure with hypercapnia: Secondary | ICD-10-CM | POA: Diagnosis not present

## 2018-03-15 DIAGNOSIS — E871 Hypo-osmolality and hyponatremia: Secondary | ICD-10-CM | POA: Diagnosis not present

## 2018-03-15 DIAGNOSIS — K219 Gastro-esophageal reflux disease without esophagitis: Secondary | ICD-10-CM | POA: Diagnosis not present

## 2018-03-15 DIAGNOSIS — G8929 Other chronic pain: Secondary | ICD-10-CM | POA: Diagnosis not present

## 2018-03-17 DIAGNOSIS — M62838 Other muscle spasm: Secondary | ICD-10-CM | POA: Diagnosis not present

## 2018-03-17 DIAGNOSIS — K219 Gastro-esophageal reflux disease without esophagitis: Secondary | ICD-10-CM | POA: Diagnosis not present

## 2018-03-17 DIAGNOSIS — M6259 Muscle wasting and atrophy, not elsewhere classified, multiple sites: Secondary | ICD-10-CM | POA: Diagnosis not present

## 2018-03-17 DIAGNOSIS — E871 Hypo-osmolality and hyponatremia: Secondary | ICD-10-CM | POA: Diagnosis not present

## 2018-03-17 DIAGNOSIS — Z7982 Long term (current) use of aspirin: Secondary | ICD-10-CM | POA: Diagnosis not present

## 2018-03-17 DIAGNOSIS — G8929 Other chronic pain: Secondary | ICD-10-CM | POA: Diagnosis not present

## 2018-03-17 DIAGNOSIS — I69351 Hemiplegia and hemiparesis following cerebral infarction affecting right dominant side: Secondary | ICD-10-CM | POA: Diagnosis not present

## 2018-03-17 DIAGNOSIS — E43 Unspecified severe protein-calorie malnutrition: Secondary | ICD-10-CM | POA: Diagnosis not present

## 2018-03-17 DIAGNOSIS — Z7401 Bed confinement status: Secondary | ICD-10-CM | POA: Diagnosis not present

## 2018-03-17 DIAGNOSIS — E7849 Other hyperlipidemia: Secondary | ICD-10-CM | POA: Diagnosis not present

## 2018-03-17 DIAGNOSIS — J449 Chronic obstructive pulmonary disease, unspecified: Secondary | ICD-10-CM | POA: Diagnosis not present

## 2018-03-17 DIAGNOSIS — I10 Essential (primary) hypertension: Secondary | ICD-10-CM | POA: Diagnosis not present

## 2018-03-17 DIAGNOSIS — J9612 Chronic respiratory failure with hypercapnia: Secondary | ICD-10-CM | POA: Diagnosis not present

## 2018-03-17 DIAGNOSIS — M25551 Pain in right hip: Secondary | ICD-10-CM | POA: Diagnosis not present

## 2018-03-20 ENCOUNTER — Telehealth: Payer: Self-pay

## 2018-03-20 DIAGNOSIS — M62838 Other muscle spasm: Secondary | ICD-10-CM | POA: Diagnosis not present

## 2018-03-20 DIAGNOSIS — E43 Unspecified severe protein-calorie malnutrition: Secondary | ICD-10-CM | POA: Diagnosis not present

## 2018-03-20 DIAGNOSIS — E7849 Other hyperlipidemia: Secondary | ICD-10-CM | POA: Diagnosis not present

## 2018-03-20 DIAGNOSIS — K219 Gastro-esophageal reflux disease without esophagitis: Secondary | ICD-10-CM | POA: Diagnosis not present

## 2018-03-20 DIAGNOSIS — G8929 Other chronic pain: Secondary | ICD-10-CM | POA: Diagnosis not present

## 2018-03-20 DIAGNOSIS — I69351 Hemiplegia and hemiparesis following cerebral infarction affecting right dominant side: Secondary | ICD-10-CM | POA: Diagnosis not present

## 2018-03-20 DIAGNOSIS — E871 Hypo-osmolality and hyponatremia: Secondary | ICD-10-CM | POA: Diagnosis not present

## 2018-03-20 DIAGNOSIS — J9612 Chronic respiratory failure with hypercapnia: Secondary | ICD-10-CM | POA: Diagnosis not present

## 2018-03-20 DIAGNOSIS — Z7982 Long term (current) use of aspirin: Secondary | ICD-10-CM | POA: Diagnosis not present

## 2018-03-20 DIAGNOSIS — M6259 Muscle wasting and atrophy, not elsewhere classified, multiple sites: Secondary | ICD-10-CM | POA: Diagnosis not present

## 2018-03-20 DIAGNOSIS — J449 Chronic obstructive pulmonary disease, unspecified: Secondary | ICD-10-CM | POA: Diagnosis not present

## 2018-03-20 DIAGNOSIS — I10 Essential (primary) hypertension: Secondary | ICD-10-CM

## 2018-03-20 DIAGNOSIS — Z7401 Bed confinement status: Secondary | ICD-10-CM | POA: Diagnosis not present

## 2018-03-20 DIAGNOSIS — M25551 Pain in right hip: Secondary | ICD-10-CM | POA: Diagnosis not present

## 2018-03-20 DIAGNOSIS — Z515 Encounter for palliative care: Secondary | ICD-10-CM | POA: Diagnosis not present

## 2018-03-20 MED ORDER — AMLODIPINE BESYLATE 10 MG PO TABS: 10.0000 mg | ORAL_TABLET | Freq: Every day | ORAL | 1 refills | Status: DC

## 2018-03-20 NOTE — Addendum Note (Signed)
Addended by: Olin Hauser on: 03/20/2018 04:57 PM   Modules accepted: Orders

## 2018-03-20 NOTE — Telephone Encounter (Signed)
Called David Hall (Palliative Care) back and discussed case.  Upon my chart review, it shows patient has had SBP 130-140s in office without any HTN medication and had done well. He was taken off combination pill Amlodipine/Valsartan/HCTZ back in 11/2017 after hospitalization. He never resumed this, he was advised to follow-up with [facility] Dr David Hall and he did not do this.  I agree given high reading, I would reconsider BP medication - I will start with single agent with Amlodipine 10mg  daily. Not the combination pill at this time.  Palliative care will inform patient and caregiver and recommend that he schedule follow-up with Dr David Hall.  Follow-up as planned.  [name], [facility] 03/20/2018, 4:56 PM

## 2018-03-20 NOTE — Telephone Encounter (Signed)
David Hall the nurse from in home palliative care state patient's B/P 210/88 and HR 88-90 during her evaluation but as per caregiver [name] was 150/70 patient had stopped some of B/P med's as per hospital recommendation. She just want Dr. Raliegh Ip aware about [name]. If any question her # [phone].

## 2018-03-21 ENCOUNTER — Telehealth: Payer: Self-pay | Admitting: Family Medicine

## 2018-03-21 NOTE — Telephone Encounter (Signed)
David Hall asked if a prescription could be sent in for UTI [phone]

## 2018-03-22 NOTE — Telephone Encounter (Signed)
David Hall advised that we don't send antibiotics for UTI patient needs to be seen by provider to reassure for UTI.

## 2018-03-23 DIAGNOSIS — E871 Hypo-osmolality and hyponatremia: Secondary | ICD-10-CM | POA: Diagnosis not present

## 2018-03-23 DIAGNOSIS — M25551 Pain in right hip: Secondary | ICD-10-CM | POA: Diagnosis not present

## 2018-03-23 DIAGNOSIS — E43 Unspecified severe protein-calorie malnutrition: Secondary | ICD-10-CM | POA: Diagnosis not present

## 2018-03-23 DIAGNOSIS — E7849 Other hyperlipidemia: Secondary | ICD-10-CM | POA: Diagnosis not present

## 2018-03-23 DIAGNOSIS — J449 Chronic obstructive pulmonary disease, unspecified: Secondary | ICD-10-CM | POA: Diagnosis not present

## 2018-03-23 DIAGNOSIS — I10 Essential (primary) hypertension: Secondary | ICD-10-CM | POA: Diagnosis not present

## 2018-03-23 DIAGNOSIS — M6259 Muscle wasting and atrophy, not elsewhere classified, multiple sites: Secondary | ICD-10-CM | POA: Diagnosis not present

## 2018-03-23 DIAGNOSIS — Z7401 Bed confinement status: Secondary | ICD-10-CM | POA: Diagnosis not present

## 2018-03-23 DIAGNOSIS — Z7982 Long term (current) use of aspirin: Secondary | ICD-10-CM | POA: Diagnosis not present

## 2018-03-23 DIAGNOSIS — K219 Gastro-esophageal reflux disease without esophagitis: Secondary | ICD-10-CM | POA: Diagnosis not present

## 2018-03-23 DIAGNOSIS — J9612 Chronic respiratory failure with hypercapnia: Secondary | ICD-10-CM | POA: Diagnosis not present

## 2018-03-23 DIAGNOSIS — M62838 Other muscle spasm: Secondary | ICD-10-CM | POA: Diagnosis not present

## 2018-03-23 DIAGNOSIS — G8929 Other chronic pain: Secondary | ICD-10-CM | POA: Diagnosis not present

## 2018-03-23 DIAGNOSIS — I69351 Hemiplegia and hemiparesis following cerebral infarction affecting right dominant side: Secondary | ICD-10-CM | POA: Diagnosis not present

## 2018-03-27 ENCOUNTER — Other Ambulatory Visit: Payer: Self-pay | Admitting: Nurse Practitioner

## 2018-03-27 ENCOUNTER — Telehealth: Payer: Self-pay | Admitting: Family Medicine

## 2018-03-27 DIAGNOSIS — Z7982 Long term (current) use of aspirin: Secondary | ICD-10-CM | POA: Diagnosis not present

## 2018-03-27 DIAGNOSIS — E7849 Other hyperlipidemia: Secondary | ICD-10-CM | POA: Diagnosis not present

## 2018-03-27 DIAGNOSIS — I69351 Hemiplegia and hemiparesis following cerebral infarction affecting right dominant side: Secondary | ICD-10-CM | POA: Diagnosis not present

## 2018-03-27 DIAGNOSIS — N182 Chronic kidney disease, stage 2 (mild): Principal | ICD-10-CM

## 2018-03-27 DIAGNOSIS — I10 Essential (primary) hypertension: Secondary | ICD-10-CM | POA: Diagnosis not present

## 2018-03-27 DIAGNOSIS — E871 Hypo-osmolality and hyponatremia: Secondary | ICD-10-CM | POA: Diagnosis not present

## 2018-03-27 DIAGNOSIS — K219 Gastro-esophageal reflux disease without esophagitis: Secondary | ICD-10-CM | POA: Diagnosis not present

## 2018-03-27 DIAGNOSIS — M25551 Pain in right hip: Secondary | ICD-10-CM | POA: Diagnosis not present

## 2018-03-27 DIAGNOSIS — I129 Hypertensive chronic kidney disease with stage 1 through stage 4 chronic kidney disease, or unspecified chronic kidney disease: Secondary | ICD-10-CM

## 2018-03-27 DIAGNOSIS — J449 Chronic obstructive pulmonary disease, unspecified: Secondary | ICD-10-CM | POA: Diagnosis not present

## 2018-03-27 DIAGNOSIS — E43 Unspecified severe protein-calorie malnutrition: Secondary | ICD-10-CM | POA: Diagnosis not present

## 2018-03-27 DIAGNOSIS — J9612 Chronic respiratory failure with hypercapnia: Secondary | ICD-10-CM | POA: Diagnosis not present

## 2018-03-27 DIAGNOSIS — G8929 Other chronic pain: Secondary | ICD-10-CM | POA: Diagnosis not present

## 2018-03-27 DIAGNOSIS — M62838 Other muscle spasm: Secondary | ICD-10-CM | POA: Diagnosis not present

## 2018-03-27 DIAGNOSIS — M6259 Muscle wasting and atrophy, not elsewhere classified, multiple sites: Secondary | ICD-10-CM | POA: Diagnosis not present

## 2018-03-27 DIAGNOSIS — Z7401 Bed confinement status: Secondary | ICD-10-CM | POA: Diagnosis not present

## 2018-03-27 MED ORDER — VALSARTAN 80 MG PO TABS: 80.0000 mg | ORAL_TABLET | Freq: Every day | ORAL | 1 refills | Status: DC

## 2018-03-27 NOTE — Telephone Encounter (Signed)
Patient remains on amlodipine 10 mg once daily.  He may not restart his old medication as this would add too much medication at one time.  - Continue amlodipine 10 mg once daily. - START valsartan 80 mg once daily.  New prescription sent to [facility].   - BP goal < 140/90 until Dr. Parks Ranger can review as patient has had hypotension in past.

## 2018-03-27 NOTE — Telephone Encounter (Signed)
I spoke w/ Gerald Stabs from [facility] and notified him of Lauren recommendations.

## 2018-03-27 NOTE — Telephone Encounter (Signed)
I agree with Lauren's recommendation. This was already handled initially on 03/20/18 in telephone encounter to restart him back on Amlodipine 10mg  daily monotherapy. I am confused as to why this was not reflected to begin with by Home Health. Regardless, addition now of ARB is good, and we will follow-up.  [name], [facility] 03/27/2018, 12:55 PM

## 2018-03-27 NOTE — Telephone Encounter (Signed)
Chris with [facility] said pt's BP is 180/80.  He said pt still has unused amlodipine valsartan hctz 10/160/12.5 and asked if he could restart that medication.  His call back number is [phone]

## 2018-03-30 DIAGNOSIS — G8929 Other chronic pain: Secondary | ICD-10-CM | POA: Diagnosis not present

## 2018-03-30 DIAGNOSIS — E871 Hypo-osmolality and hyponatremia: Secondary | ICD-10-CM | POA: Diagnosis not present

## 2018-03-30 DIAGNOSIS — I10 Essential (primary) hypertension: Secondary | ICD-10-CM | POA: Diagnosis not present

## 2018-03-30 DIAGNOSIS — J9612 Chronic respiratory failure with hypercapnia: Secondary | ICD-10-CM | POA: Diagnosis not present

## 2018-03-30 DIAGNOSIS — M6259 Muscle wasting and atrophy, not elsewhere classified, multiple sites: Secondary | ICD-10-CM | POA: Diagnosis not present

## 2018-03-30 DIAGNOSIS — Z7401 Bed confinement status: Secondary | ICD-10-CM | POA: Diagnosis not present

## 2018-03-30 DIAGNOSIS — E7849 Other hyperlipidemia: Secondary | ICD-10-CM | POA: Diagnosis not present

## 2018-03-30 DIAGNOSIS — I69351 Hemiplegia and hemiparesis following cerebral infarction affecting right dominant side: Secondary | ICD-10-CM | POA: Diagnosis not present

## 2018-03-30 DIAGNOSIS — M25551 Pain in right hip: Secondary | ICD-10-CM | POA: Diagnosis not present

## 2018-03-30 DIAGNOSIS — Z7982 Long term (current) use of aspirin: Secondary | ICD-10-CM | POA: Diagnosis not present

## 2018-03-30 DIAGNOSIS — K219 Gastro-esophageal reflux disease without esophagitis: Secondary | ICD-10-CM | POA: Diagnosis not present

## 2018-03-30 DIAGNOSIS — J449 Chronic obstructive pulmonary disease, unspecified: Secondary | ICD-10-CM | POA: Diagnosis not present

## 2018-03-30 DIAGNOSIS — E43 Unspecified severe protein-calorie malnutrition: Secondary | ICD-10-CM | POA: Diagnosis not present

## 2018-03-30 DIAGNOSIS — M62838 Other muscle spasm: Secondary | ICD-10-CM | POA: Diagnosis not present

## 2018-04-04 DIAGNOSIS — G8929 Other chronic pain: Secondary | ICD-10-CM | POA: Diagnosis not present

## 2018-04-04 DIAGNOSIS — J449 Chronic obstructive pulmonary disease, unspecified: Secondary | ICD-10-CM | POA: Diagnosis not present

## 2018-04-04 DIAGNOSIS — K219 Gastro-esophageal reflux disease without esophagitis: Secondary | ICD-10-CM | POA: Diagnosis not present

## 2018-04-04 DIAGNOSIS — Z7401 Bed confinement status: Secondary | ICD-10-CM | POA: Diagnosis not present

## 2018-04-04 DIAGNOSIS — I10 Essential (primary) hypertension: Secondary | ICD-10-CM | POA: Diagnosis not present

## 2018-04-04 DIAGNOSIS — J9612 Chronic respiratory failure with hypercapnia: Secondary | ICD-10-CM | POA: Diagnosis not present

## 2018-04-04 DIAGNOSIS — E43 Unspecified severe protein-calorie malnutrition: Secondary | ICD-10-CM | POA: Diagnosis not present

## 2018-04-04 DIAGNOSIS — M25551 Pain in right hip: Secondary | ICD-10-CM | POA: Diagnosis not present

## 2018-04-04 DIAGNOSIS — E7849 Other hyperlipidemia: Secondary | ICD-10-CM | POA: Diagnosis not present

## 2018-04-04 DIAGNOSIS — I69351 Hemiplegia and hemiparesis following cerebral infarction affecting right dominant side: Secondary | ICD-10-CM | POA: Diagnosis not present

## 2018-04-04 DIAGNOSIS — Z7982 Long term (current) use of aspirin: Secondary | ICD-10-CM | POA: Diagnosis not present

## 2018-04-04 DIAGNOSIS — M6259 Muscle wasting and atrophy, not elsewhere classified, multiple sites: Secondary | ICD-10-CM | POA: Diagnosis not present

## 2018-04-04 DIAGNOSIS — M62838 Other muscle spasm: Secondary | ICD-10-CM | POA: Diagnosis not present

## 2018-04-04 DIAGNOSIS — E871 Hypo-osmolality and hyponatremia: Secondary | ICD-10-CM | POA: Diagnosis not present

## 2018-04-07 DIAGNOSIS — K219 Gastro-esophageal reflux disease without esophagitis: Secondary | ICD-10-CM | POA: Diagnosis not present

## 2018-04-07 DIAGNOSIS — J449 Chronic obstructive pulmonary disease, unspecified: Secondary | ICD-10-CM | POA: Diagnosis not present

## 2018-04-07 DIAGNOSIS — E7849 Other hyperlipidemia: Secondary | ICD-10-CM | POA: Diagnosis not present

## 2018-04-07 DIAGNOSIS — M6259 Muscle wasting and atrophy, not elsewhere classified, multiple sites: Secondary | ICD-10-CM | POA: Diagnosis not present

## 2018-04-07 DIAGNOSIS — E871 Hypo-osmolality and hyponatremia: Secondary | ICD-10-CM | POA: Diagnosis not present

## 2018-04-07 DIAGNOSIS — Z7401 Bed confinement status: Secondary | ICD-10-CM | POA: Diagnosis not present

## 2018-04-07 DIAGNOSIS — M25551 Pain in right hip: Secondary | ICD-10-CM | POA: Diagnosis not present

## 2018-04-07 DIAGNOSIS — Z7982 Long term (current) use of aspirin: Secondary | ICD-10-CM | POA: Diagnosis not present

## 2018-04-07 DIAGNOSIS — J9612 Chronic respiratory failure with hypercapnia: Secondary | ICD-10-CM | POA: Diagnosis not present

## 2018-04-07 DIAGNOSIS — I69351 Hemiplegia and hemiparesis following cerebral infarction affecting right dominant side: Secondary | ICD-10-CM | POA: Diagnosis not present

## 2018-04-07 DIAGNOSIS — I10 Essential (primary) hypertension: Secondary | ICD-10-CM | POA: Diagnosis not present

## 2018-04-07 DIAGNOSIS — E43 Unspecified severe protein-calorie malnutrition: Secondary | ICD-10-CM | POA: Diagnosis not present

## 2018-04-07 DIAGNOSIS — G8929 Other chronic pain: Secondary | ICD-10-CM | POA: Diagnosis not present

## 2018-04-07 DIAGNOSIS — M62838 Other muscle spasm: Secondary | ICD-10-CM | POA: Diagnosis not present

## 2018-04-11 ENCOUNTER — Other Ambulatory Visit: Payer: Self-pay | Admitting: Nurse Practitioner

## 2018-04-11 ENCOUNTER — Other Ambulatory Visit: Payer: Self-pay | Admitting: Family Medicine

## 2018-04-11 DIAGNOSIS — G890 Central pain syndrome: Secondary | ICD-10-CM

## 2018-04-11 DIAGNOSIS — F319 Bipolar disorder, unspecified: Secondary | ICD-10-CM

## 2018-05-09 ENCOUNTER — Other Ambulatory Visit: Payer: Medicare Other

## 2018-05-09 DIAGNOSIS — R739 Hyperglycemia, unspecified: Secondary | ICD-10-CM | POA: Diagnosis not present

## 2018-05-09 DIAGNOSIS — I129 Hypertensive chronic kidney disease with stage 1 through stage 4 chronic kidney disease, or unspecified chronic kidney disease: Secondary | ICD-10-CM | POA: Diagnosis not present

## 2018-05-09 DIAGNOSIS — E43 Unspecified severe protein-calorie malnutrition: Secondary | ICD-10-CM | POA: Diagnosis not present

## 2018-05-09 DIAGNOSIS — E538 Deficiency of other specified B group vitamins: Secondary | ICD-10-CM | POA: Diagnosis not present

## 2018-05-09 DIAGNOSIS — D509 Iron deficiency anemia, unspecified: Secondary | ICD-10-CM | POA: Diagnosis not present

## 2018-05-09 DIAGNOSIS — N182 Chronic kidney disease, stage 2 (mild): Secondary | ICD-10-CM

## 2018-05-10 LAB — COMPLETE METABOLIC PANEL WITH GFR
AG Ratio: 1.8 (calc) (ref 1.0–2.5)
ALT: 10 U/L (ref 9–46)
AST: 11 U/L (ref 10–35)
Albumin: 4.4 g/dL (ref 3.6–5.1)
Alkaline phosphatase (APISO): 72 U/L (ref 40–115)
BUN: 12 mg/dL (ref 7–25)
CO2: 25 mmol/L (ref 20–32)
Calcium: 9.4 mg/dL (ref 8.6–10.3)
Chloride: 100 mmol/L (ref 98–110)
Creat: 0.79 mg/dL (ref 0.70–1.25)
GFR, Est African American: 112 mL/min/{1.73_m2} (ref >=60)
GFR, Est Non African American: 96 mL/min/{1.73_m2} (ref >=60)
Globulin: 2.5 g/dL (calc) (ref 1.9–3.7)
Glucose, Bld: 100 mg/dL — ABNORMAL HIGH (ref 65–99)
Potassium: 3.7 mmol/L (ref 3.5–5.3)
Sodium: 135 mmol/L (ref 135–146)
Total Bilirubin: 0.4 mg/dL (ref 0.2–1.2)
Total Protein: 6.9 g/dL (ref 6.1–8.1)

## 2018-05-10 LAB — HEMOGLOBIN A1C
Hgb A1c MFr Bld: 4.8 % of total Hgb (ref <5.7)
Mean Plasma Glucose: 91 (calc)
eAG (mmol/L): 5 (calc)

## 2018-05-10 LAB — CBC WITH DIFFERENTIAL/PLATELET
Basophils Absolute: 39 cells/uL (ref 0–200)
Basophils Relative: 0.5 %
Eosinophils Absolute: 211 cells/uL (ref 15–500)
Eosinophils Relative: 2.7 %
HCT: 36.3 % — ABNORMAL LOW (ref 38.5–50.0)
Hemoglobin: 12 g/dL — ABNORMAL LOW (ref 13.2–17.1)
Lymphs Abs: 1482 cells/uL (ref 850–3900)
MCH: 29.6 pg (ref 27.0–33.0)
MCHC: 33.1 g/dL (ref 32.0–36.0)
MCV: 89.6 fL (ref 80.0–100.0)
MPV: 10.9 fL (ref 7.5–12.5)
Monocytes Relative: 8.3 %
Neutro Abs: 5421 cells/uL (ref 1500–7800)
Neutrophils Relative %: 69.5 %
Platelets: 236 10*3/uL (ref 140–400)
RBC: 4.05 10*6/uL — ABNORMAL LOW (ref 4.20–5.80)
RDW: 13.4 % (ref 11.0–15.0)
Total Lymphocyte: 19 %
WBC mixed population: 647 cells/uL (ref 200–950)
WBC: 7.8 10*3/uL (ref 3.8–10.8)

## 2018-05-10 LAB — FOLATE: Folate: 14.4 ng/mL

## 2018-05-16 ENCOUNTER — Ambulatory Visit: Payer: Self-pay | Admitting: Family Medicine

## 2018-05-22 ENCOUNTER — Other Ambulatory Visit: Payer: Self-pay | Admitting: Family Medicine

## 2018-05-22 DIAGNOSIS — G890 Central pain syndrome: Secondary | ICD-10-CM

## 2018-05-30 ENCOUNTER — Other Ambulatory Visit: Payer: Self-pay | Admitting: Nurse Practitioner

## 2018-05-30 DIAGNOSIS — I129 Hypertensive chronic kidney disease with stage 1 through stage 4 chronic kidney disease, or unspecified chronic kidney disease: Secondary | ICD-10-CM

## 2018-05-30 DIAGNOSIS — N182 Chronic kidney disease, stage 2 (mild): Principal | ICD-10-CM

## 2018-06-05 ENCOUNTER — Ambulatory Visit (INDEPENDENT_AMBULATORY_CARE_PROVIDER_SITE_OTHER): Payer: Medicare Other | Admitting: Family Medicine

## 2018-06-05 ENCOUNTER — Other Ambulatory Visit: Payer: Self-pay | Admitting: Family Medicine

## 2018-06-05 ENCOUNTER — Encounter: Payer: Self-pay | Admitting: Family Medicine

## 2018-06-05 VITALS — BP 134/77 | HR 91 | Temp 97.6°F | Resp 16 | Ht 68.0 in | Wt 119.0 lb

## 2018-06-05 DIAGNOSIS — G890 Central pain syndrome: Secondary | ICD-10-CM | POA: Diagnosis not present

## 2018-06-05 DIAGNOSIS — Z0000 Encounter for general adult medical examination without abnormal findings: Secondary | ICD-10-CM | POA: Diagnosis not present

## 2018-06-05 DIAGNOSIS — Z23 Encounter for immunization: Secondary | ICD-10-CM

## 2018-06-05 DIAGNOSIS — F319 Bipolar disorder, unspecified: Secondary | ICD-10-CM

## 2018-06-05 MED ORDER — BREXPIPRAZOLE 2 MG PO TABS: 4.0000 mg | ORAL_TABLET | Freq: Every day | ORAL | 0 refills | Status: DC

## 2018-06-05 MED ORDER — TRAZODONE HCL 300 MG PO TABS: 300.0000 mg | ORAL_TABLET | Freq: Every day | ORAL | 0 refills | Status: DC

## 2018-06-05 MED ORDER — GABAPENTIN 600 MG PO TABS: ORAL_TABLET | ORAL | 1 refills | Status: DC

## 2018-06-05 MED ORDER — DIVALPROEX SODIUM 500 MG PO DR TAB: 500.0000 mg | DELAYED_RELEASE_TABLET | Freq: Two times a day (BID) | ORAL | 0 refills | Status: DC

## 2018-06-05 MED ORDER — DULOXETINE HCL 30 MG PO CPEP: 90.0000 mg | ORAL_CAPSULE | Freq: Every day | ORAL | 0 refills | Status: DC

## 2018-06-05 NOTE — Patient Instructions (Addendum)
Thank you for coming to the office today.  Flu shot today  Agreed to refill Psychiatry meds - Rexulti, Trazodone, Duloxetine, Depakote today - for 1 month - I would strongly encourage that he get to [facility] for future refills and management after today. In future I may not be able to continue all of these medications.  May continue Iron supplement every day if taking still.  Continue protein / meal supplement daily as tolerated  Increase Gabapentin for pain / sleep - may add one additional 600mg  tablet to one dose a day for a week and then increase, now start 1 pill in morning, 1 pill in afternoon and TWO pills at night (600mg  x 2 pills = 1200mg ), after 1 week if tolerating well can take 1 pill in morning and TWO pills in afternoon and TWO pills in evening. Max dose is 2 pills 3 times a day.   Please schedule a Follow-up Appointment to: Return in about 3 months (around 09/05/2018) for Follow-up COPD, Weakness, Anemia.  If you have any other questions or concerns, please feel free to call the office or send a message through [place]. You may also schedule an earlier appointment if necessary.  Additionally, you may be receiving a survey about your experience at our office within a few days to 1 week by e-mail or mail. We value your feedback.  [name], DO [place]

## 2018-06-05 NOTE — Progress Notes (Signed)
Subjective:    Patient ID: David Alpers., male    DOB: [birth year], 62 y.o.   MRN: [id number]  David Oyen. is a 62 y.o. male presenting on 06/05/2018 for Annual Exam  Patient not accompanied by anyone at his appointment today.  HPI   Here for Annual Physical and Lab Review  Bipolar 1 / Depression, recurrent - Cannot get back to [place] - needs ride, missed prior apt, request refills - Request refill Rexulti, Trazodone, Duloxetine - for mood, sleep - Also inc dose Gabapentin requested - insomnia, pain with improvement  IMPROVED - Weight Loss / Generalized Muscle Atrophy and Weakness / Severe Protein Calorie Malnutrition / Chronic Pain - Last visit with me 02/2018, see prior notes for background information. - Interval - he has completed HH PT and RN - He is overall improving with some weight stable without loss, improved nutrition - Has not had palliatiave care set up - No new COPD flare or problem with breathing, actually improved now only on supplemental O2 overnight not during day - Mobility improved, transfers improved - He admits to eating good meals and not having problem with nutrition, takes supplement PRN - Limited by transportation cannot get to office often or hospital - has missed follow-up to Psychiatry at [facility] needs refills today - Previously followed by [facility] when we ordered it back in 11/2017, and he benefited from [facility] PT and he has tried to do some home exercise   Additional concerns: - History of anemia - onset after hospitalization 09/2017 after hip surgery, he has improved nutrition, and they have started iron pills without significant changes. Now recent lab shows improved Hemoglobin and folate. He denies dark stool or rectal bleeding  Health Maintenance: Due for Flu Shot, will receive today    Depression screen [facility] 2/9 02/13/2018 04/19/2017 10/27/2015  Decreased Interest 0 0 0  Down, Depressed, Hopeless 0 0 0  PHQ - 2 Score 0 0  0  Altered sleeping 0 - -  Tired, decreased energy 3 - -  Change in appetite 0 - -  Feeling bad or failure about yourself  0 - -  Trouble concentrating 0 - -  Moving slowly or fidgety/restless 0 - -  Suicidal thoughts 0 - -  PHQ-9 Score 3 - -  Difficult doing work/chores - - -    Past Medical History:  Diagnosis Date  . Bipolar disorder ([place])   . GERD (gastroesophageal reflux disease)   . Hyperlipidemia   . Hypertension   . Insomnia, controlled   . Obesity   . Oxygen deficiency    2 LNC at night  . Shortness of breath   . Stroke [facility]) 2014   R sided hemiplegia  . Substance abuse ([place]) 2016   cocaine use.   Past Surgical History:  Procedure Laterality Date  . CARPAL TUNNEL RELEASE    . INTRAMEDULLARY (IM) NAIL INTERTROCHANTERIC Right 09/21/2017   Procedure: INTRAMEDULLARY (IM) NAIL INTERTROCHANTRIC;  Surgeon: Corky Mull, MD;  Location: [facility] ORS;  Service: Orthopedics;  Laterality: Right;  . TONSILLECTOMY     Social History   Socioeconomic History  . Marital status: Single    Spouse name: Not on file  . Number of children: Not on file  . Years of education: Not on file  . Highest education level: Not on file  Occupational History  . Not on file  Social Needs  . Financial resource strain: Not on file  . Food  insecurity:    Worry: Not on file    Inability: Not on file  . Transportation needs:    Medical: Not on file    Non-medical: Not on file  Tobacco Use  . Smoking status: Current Every Day Smoker    Packs/day: 0.50    Years: 45.00    Pack years: 22.50    Types: Cigarettes  . Smokeless tobacco: Current User  . Tobacco comment: smoking 10-15 cigs daily   Substance and Sexual Activity  . Alcohol use: No    Alcohol/week: 0.0 standard drinks  . Drug use: No  . Sexual activity: Not on file  Lifestyle  . Physical activity:    Days per week: Not on file    Minutes per session: Not on file  . Stress: Not on file  Relationships  . Social connections:     Talks on phone: Not on file    Gets together: Not on file    Attends religious service: Not on file    Active member of club or organization: Not on file    Attends meetings of clubs or organizations: Not on file    Relationship status: Not on file  . Intimate partner violence:    Fear of current or ex partner: Not on file    Emotionally abused: Not on file    Physically abused: Not on file    Forced sexual activity: Not on file  Other Topics Concern  . Not on file  Social History Narrative  . Not on file   Family History  Problem Relation Age of Onset  . CAD Mother   . Hypertension Mother   . Stroke Mother   . Lung disease Father    Current Outpatient Medications on File Prior to Visit  Medication Sig  . albuterol (PROVENTIL) (2.5 MG/3ML) 0.083% nebulizer solution Take 3 mLs (2.5 mg total) by nebulization every 6 (six) hours as needed for wheezing or shortness of breath.  [name] amLODipine (NORVASC) 10 MG tablet Take 1 tablet (10 mg total) by mouth daily.  [name] aspirin EC 81 MG tablet Take 81 mg by mouth daily.  [name] atorvastatin (LIPITOR) 40 MG tablet TAKE 1 TABLET BY MOUTH DAILY.  [name] omeprazole (PRILOSEC) 20 MG capsule TAKE 1 CAPSULE BY MOUTH TWICE DAILY  . potassium chloride SA (K-DUR,KLOR-CON) 20 MEQ tablet TAKE 1 TABLET BY MOUTH EVERY DAY  . PROAIR HFA 108 (90 Base) MCG/ACT inhaler INHALE 2 PUFFS BY MOUTH INTO THE LUNGS FOUR TIMES DAILY AS NEEDED  . valsartan (DIOVAN) 80 MG tablet TAKE 1 TABLET(80 MG) BY MOUTH DAILY   No current facility-administered medications on file prior to visit.     Review of Systems  Constitutional: Negative for activity change, appetite change, chills, diaphoresis, fatigue and fever.  HENT: Negative for congestion and hearing loss.   Eyes: Negative for visual disturbance.  Respiratory: Positive for cough, shortness of breath and wheezing. Negative for apnea, choking and chest tightness.   Cardiovascular: Negative for chest pain, palpitations and leg  swelling.  Gastrointestinal: Negative for abdominal pain, anal bleeding, blood in stool, constipation, diarrhea, nausea and vomiting.  Endocrine: Negative for cold intolerance.  Genitourinary: Negative for difficulty urinating, dysuria, frequency and hematuria.  Musculoskeletal: Positive for arthralgias, back pain and gait problem. Negative for neck pain.  Skin: Negative for rash.  Allergic/Immunologic: Negative for environmental allergies.  Neurological: Negative for dizziness, weakness, light-headedness, numbness and headaches.  Hematological: Negative for adenopathy.  Psychiatric/Behavioral: Negative for behavioral problems, dysphoric mood  and sleep disturbance. The patient is not nervous/anxious.    Per HPI unless specifically indicated above     Objective:    BP 134/77   Pulse 91   Temp 97.6 F (36.4 C) (Oral)   Resp 16   Ht 5\' 8"  (1.727 m)   Wt 119 lb (54 kg)   BMI 18.09 kg/m   Wt Readings from Last 3 Encounters:  06/05/18 119 lb (54 kg)  02/13/18 119 lb (54 kg)  11/11/17 130 lb (59 kg)    Physical Exam  Constitutional: He is oriented to person, place, and time. No distress.  Chronically ill-appearing, mostly comfortable, cooperative, now with walker and able to ambulate, somewhat improved but still thin cachectic appearance  HENT:  Head: Normocephalic and atraumatic.  Mouth/Throat: Oropharynx is clear and moist.  Not wearing any supplemental oxygen.  Poor dentition  Eyes: EOM are normal. Right eye exhibits no discharge. Left eye exhibits no discharge.  Neck: Normal range of motion. Neck supple.  Cardiovascular: Normal rate, regular rhythm, normal heart sounds and intact distal pulses.  No murmur heard. Pulmonary/Chest: No respiratory distress. He has no wheezes. He has no rales.  Mildly reduced air movement diffusely similar at baseline, no focal crackles or wheezing.  Musculoskeletal: He exhibits no edema.  Significant muscle atrophy bilateral upper and lower  extremities, trunk, and facial temple regions  Improved RUE movement, still some hemiparesis with weakness unable to lift on it's own uses other arm to support. Improved RLE movement.  Neurological: He is alert and oriented to person, place, and time.  Skin: Skin is warm and dry. No rash noted. He is not diaphoretic. No erythema.  Thick dry skin  Psychiatric: He has a normal mood and affect. His behavior is normal.  Grooming slightly improved since las tvisit, good eye contact, normal speech and thoughts. Limited verbal.  Nursing note and vitals reviewed.  Results for orders placed or performed in visit on 05/09/18  Folate  Result Value Ref Range   Folate 14.4 ng/mL  CBC with Differential/Platelet  Result Value Ref Range   WBC 7.8 3.8 - 10.8 Thousand/uL   RBC 4.05 (L) 4.20 - 5.80 Million/uL   Hemoglobin 12.0 (L) 13.2 - 17.1 g/dL   HCT 36.3 (L) 38.5 - 50.0 %   MCV 89.6 80.0 - 100.0 fL   MCH 29.6 27.0 - 33.0 pg   MCHC 33.1 32.0 - 36.0 g/dL   RDW 13.4 11.0 - 15.0 %   Platelets 236 140 - 400 Thousand/uL   MPV 10.9 7.5 - 12.5 fL   Neutro Abs 5,421 1,500 - 7,800 cells/uL   Lymphs Abs 1,482 850 - 3,900 cells/uL   WBC mixed population 647 200 - 950 cells/uL   Eosinophils Absolute 211 15 - 500 cells/uL   Basophils Absolute 39 0 - 200 cells/uL   Neutrophils Relative % 69.5 %   Total Lymphocyte 19.0 %   Monocytes Relative 8.3 %   Eosinophils Relative 2.7 %   Basophils Relative 0.5 %  COMPLETE METABOLIC PANEL WITH GFR  Result Value Ref Range   Glucose, Bld 100 (H) 65 - 99 mg/dL   BUN 12 7 - 25 mg/dL   Creat 0.79 0.70 - 1.25 mg/dL   GFR, Est Non African American 96 > OR = 60 mL/min/1.53m2   GFR, Est African American 112 > OR = 60 mL/min/1.77m2   BUN/Creatinine Ratio NOT APPLICABLE 6 - 22 (calc)   Sodium 135 135 - 146 mmol/L   Potassium  3.7 3.5 - 5.3 mmol/L   Chloride 100 98 - 110 mmol/L   CO2 25 20 - 32 mmol/L   Calcium 9.4 8.6 - 10.3 mg/dL   Total Protein 6.9 6.1 - 8.1 g/dL    Albumin 4.4 3.6 - 5.1 g/dL   Globulin 2.5 1.9 - 3.7 g/dL (calc)   AG Ratio 1.8 1.0 - 2.5 (calc)   Total Bilirubin 0.4 0.2 - 1.2 mg/dL   Alkaline phosphatase (APISO) 72 40 - 115 U/L   AST 11 10 - 35 U/L   ALT 10 9 - 46 U/L  Hemoglobin A1c  Result Value Ref Range   Hgb A1c MFr Bld 4.8 <5.7 % of total Hgb   Mean Plasma Glucose 91 (calc)   eAG (mmol/L) 5.0 (calc)      Assessment & Plan:   Problem List Items Addressed This Visit    Bipolar 1 disorder, depressed ([place]) Previously followed by Psychiatry [facility] Advised to return if able, needs transportation Agree for 1 month supply rx for his meds to bridge him to next Psych    Relevant Medications   Brexpiprazole (REXULTI) 2 MG TABS   DULoxetine (CYMBALTA) 30 MG capsule   trazodone (DESYREL) 300 MG tablet   divalproex (DEPAKOTE) 500 MG DR tablet   Central pain syndrome (CPS) (Chronic) Secondary to chronic illness, multiple factors Improving ambulation with improvement, wt stable Refilled Duloxetine Increase dose Gabapentin dose titrate up as advised    Relevant Medications   DULoxetine (CYMBALTA) 30 MG capsule   gabapentin (NEURONTIN) 600 MG tablet   divalproex (DEPAKOTE) 500 MG DR tablet    Other Visit Diagnoses    Annual physical exam    -  Primary  Updated Health Maintenance information Reviewed recent lab results with patient Encouraged improvement to lifestyle    Needs flu shot       Relevant Orders   Flu Vaccine QUAD 6+ mos PF IM (Fluarix Quad PF) (Completed)      Meds ordered this encounter  Medications  . Brexpiprazole (REXULTI) 2 MG TABS    Sig: Take 4 mg by mouth at bedtime.    Dispense:  30 tablet    Refill:  0  . DULoxetine (CYMBALTA) 30 MG capsule    Sig: Take 3 capsules (90 mg total) by mouth daily.    Dispense:  90 capsule    Refill:  0  . gabapentin (NEURONTIN) 600 MG tablet    Sig: Start with 1 tablet (600mg ) 3 times daily, then increase gradually by 1 tablet per day every 1 week to max of 2 tab  (1200mg ) 3 times day    Dispense:  180 tablet    Refill:  1  . trazodone (DESYREL) 300 MG tablet    Sig: Take 1 tablet (300 mg total) by mouth at bedtime. Reported on 10/27/2015    Dispense:  30 tablet    Refill:  0  . divalproex (DEPAKOTE) 500 MG DR tablet    Sig: Take 1 tablet (500 mg total) by mouth 2 (two) times daily.    Dispense:  60 tablet    Refill:  0     Follow up plan: Return in about 3 months (around 09/05/2018) for Follow-up COPD, Weakness, Anemia.  [name], DO [facility] 06/05/2018, 5:20 PM

## 2018-06-24 ENCOUNTER — Other Ambulatory Visit: Payer: Self-pay | Admitting: Family Medicine

## 2018-06-24 DIAGNOSIS — J432 Centrilobular emphysema: Secondary | ICD-10-CM

## 2018-07-07 ENCOUNTER — Other Ambulatory Visit: Payer: Self-pay | Admitting: Family Medicine

## 2018-07-07 DIAGNOSIS — G890 Central pain syndrome: Secondary | ICD-10-CM

## 2018-07-07 DIAGNOSIS — F319 Bipolar disorder, unspecified: Secondary | ICD-10-CM

## 2018-07-08 ENCOUNTER — Other Ambulatory Visit: Payer: Self-pay | Admitting: Family Medicine

## 2018-07-08 DIAGNOSIS — G890 Central pain syndrome: Secondary | ICD-10-CM

## 2018-07-08 DIAGNOSIS — F319 Bipolar disorder, unspecified: Secondary | ICD-10-CM

## 2018-08-07 ENCOUNTER — Other Ambulatory Visit: Payer: Self-pay | Admitting: Nurse Practitioner

## 2018-08-07 ENCOUNTER — Other Ambulatory Visit: Payer: Self-pay | Admitting: Family Medicine

## 2018-08-07 DIAGNOSIS — G890 Central pain syndrome: Secondary | ICD-10-CM

## 2018-08-07 DIAGNOSIS — F319 Bipolar disorder, unspecified: Secondary | ICD-10-CM

## 2018-08-08 ENCOUNTER — Other Ambulatory Visit: Payer: Self-pay | Admitting: Family Medicine

## 2018-08-08 DIAGNOSIS — G890 Central pain syndrome: Secondary | ICD-10-CM

## 2018-08-08 DIAGNOSIS — F319 Bipolar disorder, unspecified: Secondary | ICD-10-CM

## 2018-09-04 ENCOUNTER — Other Ambulatory Visit: Payer: Self-pay | Admitting: Family Medicine

## 2018-09-04 DIAGNOSIS — E785 Hyperlipidemia, unspecified: Secondary | ICD-10-CM

## 2018-09-04 DIAGNOSIS — K219 Gastro-esophageal reflux disease without esophagitis: Secondary | ICD-10-CM

## 2018-09-05 ENCOUNTER — Telehealth: Payer: Self-pay | Admitting: Family Medicine

## 2018-09-05 NOTE — Telephone Encounter (Signed)
Pt needs a refill on potassium [phone]

## 2018-09-07 ENCOUNTER — Other Ambulatory Visit: Payer: Self-pay | Admitting: Family Medicine

## 2018-09-07 ENCOUNTER — Other Ambulatory Visit: Payer: Self-pay

## 2018-09-07 DIAGNOSIS — N182 Chronic kidney disease, stage 2 (mild): Principal | ICD-10-CM

## 2018-09-07 DIAGNOSIS — F319 Bipolar disorder, unspecified: Secondary | ICD-10-CM

## 2018-09-07 DIAGNOSIS — G890 Central pain syndrome: Secondary | ICD-10-CM

## 2018-09-07 DIAGNOSIS — I129 Hypertensive chronic kidney disease with stage 1 through stage 4 chronic kidney disease, or unspecified chronic kidney disease: Secondary | ICD-10-CM

## 2018-09-07 DIAGNOSIS — E876 Hypokalemia: Secondary | ICD-10-CM

## 2018-09-07 MED ORDER — POTASSIUM CHLORIDE CRYS ER 20 MEQ PO TBCR: 20.0000 meq | EXTENDED_RELEASE_TABLET | Freq: Every day | ORAL | 3 refills | Status: DC

## 2018-09-07 NOTE — Telephone Encounter (Signed)
Refilled  [name], [facility] 09/07/2018, 10:19 PM

## 2018-09-19 ENCOUNTER — Ambulatory Visit: Payer: Self-pay

## 2018-10-02 ENCOUNTER — Telehealth: Payer: Self-pay | Admitting: Family Medicine

## 2018-10-02 NOTE — Telephone Encounter (Signed)
Called to RESCHEDULE Medicare Annual Wellness Visit with the [facility]. Patient cancelled last AWV for 09/19/2018.  If patient returns call, please note: their last AWV was on 04/19/2017 please schedule AWV with [facility] any date AFTER 04/19/2018.  Thank you! For any questions please contact: Janace Hoard at [phone] or Skype [email or web address]

## 2018-10-11 ENCOUNTER — Ambulatory Visit (INDEPENDENT_AMBULATORY_CARE_PROVIDER_SITE_OTHER): Payer: Medicare Other | Admitting: Family Medicine

## 2018-10-11 ENCOUNTER — Encounter: Payer: Self-pay | Admitting: Family Medicine

## 2018-10-11 ENCOUNTER — Other Ambulatory Visit: Payer: Self-pay

## 2018-10-11 VITALS — BP 110/61 | HR 108 | Temp 98.2°F | Resp 16 | Ht 68.0 in | Wt 115.0 lb

## 2018-10-11 DIAGNOSIS — J449 Chronic obstructive pulmonary disease, unspecified: Secondary | ICD-10-CM

## 2018-10-11 DIAGNOSIS — G890 Central pain syndrome: Secondary | ICD-10-CM | POA: Diagnosis not present

## 2018-10-11 DIAGNOSIS — J9612 Chronic respiratory failure with hypercapnia: Secondary | ICD-10-CM

## 2018-10-11 DIAGNOSIS — E43 Unspecified severe protein-calorie malnutrition: Secondary | ICD-10-CM | POA: Diagnosis not present

## 2018-10-11 DIAGNOSIS — I69359 Hemiplegia and hemiparesis following cerebral infarction affecting unspecified side: Secondary | ICD-10-CM

## 2018-10-11 DIAGNOSIS — R634 Abnormal weight loss: Secondary | ICD-10-CM

## 2018-10-11 DIAGNOSIS — R6889 Other general symptoms and signs: Secondary | ICD-10-CM | POA: Diagnosis not present

## 2018-10-11 DIAGNOSIS — D509 Iron deficiency anemia, unspecified: Secondary | ICD-10-CM

## 2018-10-11 DIAGNOSIS — F319 Bipolar disorder, unspecified: Secondary | ICD-10-CM

## 2018-10-11 DIAGNOSIS — J431 Panlobular emphysema: Secondary | ICD-10-CM

## 2018-10-11 MED ORDER — FLUTICASONE-UMECLIDIN-VILANT 100-62.5-25 MCG/INH IN AEPB: 1.0000 | INHALATION_SPRAY | Freq: Every day | RESPIRATORY_TRACT | 0 refills | Status: DC

## 2018-10-11 MED ORDER — GABAPENTIN 600 MG PO TABS: 600.0000 mg | ORAL_TABLET | Freq: Three times a day (TID) | ORAL | 1 refills | Status: DC

## 2018-10-11 NOTE — Patient Instructions (Addendum)
Thank you for coming to the office today.  We will check some blood work today - to look for body nutrition and protein levels and chemistry / liver function and anemia.  Increase Gabapentin from 600mg  pill twice a day - up to one pill THREE times a day. New rx sent.  Referral sent to Hospice Service through - [facility] ([facility])  [facility] / [phone]  [place], [place] [address] Ph: [phone]  [address] [place], [place] [address] Ph: [phone]  They will contact you further for a home visit evaluation and determine what services are appropriate and needed.  If they initiate care with you then they can work on improving symptom control, pain, breathing, and weight loss. If you are in hospice then it may be active for up to 6 months, and may graduate their program.  Also if preferred or needed, we can switch to a referral again to [facility] to help at home with therapy and RN monitoring but this is only short term. May need to locate a [facility] for long term care in the future.  If worsening shortness of breath, can return for breathing treatment or go to hospital ED for treatment if needed.  Please schedule a Follow-up Appointment to: Return in about 6 weeks (around 11/22/2018) for Weight loss / home care.  If you have any other questions or concerns, please feel free to call the office or send a message through [place]. You may also schedule an earlier appointment if necessary.  Additionally, you may be receiving a survey about your experience at our office within a few days to 1 week by e-mail or mail. We value your feedback.  [name], DO [facility]

## 2018-10-11 NOTE — Progress Notes (Signed)
Subjective:    Patient ID: David Alpers., male    DOB: Dec 28, 1955, 63 y.o.   MRN: [id number]  David Fulgham. is a 63 y.o. male presenting on 10/11/2018 for Weight Loss  Patient is here at appointment alone, not accompanied by anyone else.  HPI   Weight Loss / Generalized Muscle Atrophy and Weakness / Severe Protein Calorie Malnutrition / Chronic Pain / End Stage COPD - Emphysema - Last visit with me 05/2018, see prior notes for background information. - Interval - he has completed HH PT and RN, with temporary benefit, but still believes he would need additional help at home. - Recent gradual worsening weight loss now down 4 more lbs in 5 months since last visit, despite improved nutrition, and reported up to 4-5 meals most days - Previously evaluated by Palliative Care in 03/2018, had been managed on current meds and had some improved weight gain at that time, however since then has had difficulty adhering to medications due to financial reasons and has continued to lose weight despite his efforts. - He still has persistent COPD problem with persistent dyspnea, without any recent acute flare up. Only on O2 supplemental at times, does not have it with him currently - His mobility has improved, now he is able to ambulate using rolling walker, has had some falls however by his report recently - Limited by transportation cannot get to office often or hospital - has missed follow-up to Psychiatry at [facility] - For his pain, Gabapentin is working but not lasting long enough, now taking 600mg  BID, requesting dose adjust  Previously followed by [facility] in past but seems to continue to require additional support and gradually worsening with continued weight loss  Denies dyspnea, chest pain, edema, nausea vomiting, worsening pain, productive cough, fever or chills  Bipolar 1 / Depression, chronic recurrent - Cannot get back to [place] - needs ride, missed prior apt, request  refills and we provided Rexulti temporarily, but now it is not covered and he is only taking Duloxetine and Trazodone regularly - he feels like his mood overall has improved, and is currently not endorsing depressive symptoms, but does admit frustrated with his current health status.  Iron deficiency Anemia Prior lab trend from Hgb 8.1 up to 12 on repeat in 2019. Now due for re-check as requested. Denies any active bleeding.   Depression screen [facility] 2/9 10/11/2018 02/13/2018 04/19/2017  Decreased Interest 0 0 0  Down, Depressed, Hopeless 0 0 0  PHQ - 2 Score 0 0 0  Altered sleeping 0 0 -  Tired, decreased energy 0 3 -  Change in appetite 0 0 -  Feeling bad or failure about yourself  0 0 -  Trouble concentrating 0 0 -  Moving slowly or fidgety/restless 0 0 -  Suicidal thoughts 0 0 -  PHQ-9 Score 0 3 -  Difficult doing work/chores Not difficult at all - -    Social History   Tobacco Use  . Smoking status: Current Every Day Smoker    Packs/day: 0.50    Years: 45.00    Pack years: 22.50    Types: Cigarettes  . Smokeless tobacco: Current User  . Tobacco comment: smoking 10-15 cigs daily   Substance Use Topics  . Alcohol use: No    Alcohol/week: 0.0 standard drinks  . Drug use: No    Review of Systems Per HPI unless specifically indicated above     Objective:    BP  110/61   Pulse (!) 108   Temp 98.2 F (36.8 C) (Oral)   Resp 16   Ht 5\' 8"  (1.727 m)   Wt 115 lb (52.2 kg)   SpO2 100%   BMI 17.49 kg/m   Wt Readings from Last 3 Encounters:  10/11/18 115 lb (52.2 kg)  06/05/18 119 lb (54 kg)  02/13/18 119 lb (54 kg)    Physical Exam Vitals signs and nursing note reviewed.  Constitutional:      General: He is not in acute distress.    Appearance: He is not diaphoretic.     Comments: Chronically ill-appearing, mostly comfortable, cooperative, now with walker and able to ambulate, very thin cachectic appearance with more weight loss  HENT:     Head: Normocephalic and  atraumatic.  Eyes:     General:        Right eye: No discharge.        Left eye: No discharge.  Neck:     Musculoskeletal: Normal range of motion and neck supple.  Cardiovascular:     Rate and Rhythm: Normal rate and regular rhythm.     Heart sounds: Normal heart sounds. No murmur.  Pulmonary:     Effort: No respiratory distress.     Breath sounds: No wheezing or rales.  Musculoskeletal:     Comments: Significant muscle atrophy bilateral upper and lower extremities, trunk, and facial temple regions  Persistent to worsening Right upper and lower extremity movement. Very limited if any active range of motion, with persistent hemiparesis, unable to lift arm or leg, but has some mild movement against resistance with grip and lower leg strength distal is 2 to 3 out of 5  Skin:    General: Skin is warm and dry.     Findings: No erythema or rash.     Comments: Thick dry skin  Neurological:     Mental Status: He is alert and oriented to person, place, and time.  Psychiatric:        Behavior: Behavior normal.     Comments: Grooming slightly improved since last visit, good eye contact, normal speech and thoughts. Limited verbal.        Assessment & Plan:   Problem List Items Addressed This Visit    Bipolar 1 disorder, depressed ([place])   Relevant Orders   CBC with Differential/Platelet   COMPLETE METABOLIC PANEL WITH GFR   Central pain syndrome (CPS) (Chronic)   Relevant Medications   gabapentin (NEURONTIN) 600 MG tablet   Chronic respiratory failure (HCC)   Relevant Orders   CBC with Differential/Platelet   COMPLETE METABOLIC PANEL WITH GFR   End stage COPD ([place])   Relevant Medications   Fluticasone-Umeclidin-Vilant (TRELEGY ELLIPTA) 100-62.5-25 MCG/INH AEPB   Panlobular emphysema ([place])   Relevant Medications   Fluticasone-Umeclidin-Vilant (TRELEGY ELLIPTA) 100-62.5-25 MCG/INH AEPB   Protein-calorie malnutrition, severe ([place]) - Primary   Relevant Orders   CBC with  Differential/Platelet   COMPLETE METABOLIC PANEL WITH GFR   Right 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) ([place]) (Chronic)    Other Visit Diagnoses    Weight loss       Iron deficiency anemia, unspecified iron deficiency anemia type       Relevant Orders   CBC with Differential/Platelet      Recurrent progressive clinical decline with regards to overall health and chronic medical conditions Concern with resumed weight loss and muscle atrophy, generalized weakness - worse with R-sided hemiparesis secondary to CVA  limited his function - but seems to have some improved mobility currently - With protein calorie malnutrition and chronic disease - End Stage COPD emphysema, concern with continued cachexia and decline - Previous palliative care patient, had improved in past 2019, was not on hospice - temporarily improved with [facility] RN PT in past from [place] - but seems to still require further intervention despite extensive [facility] treatment in past  - Of note prior APS case was opened and evaluated back in 11/2017 - concern for self neglect, there are still some concerns from patient's friend/caregiver that he is not maintaining his hygiene and showering and other ADLs  No evidence of Acute COPD flare, resp status seems stable at this time, but has some baseline dyspnea, limited by cost of inhalers - now without nebulizer or inhaler.  Plan Discussion again on concerns for his long-term poor prognosis and difficulty of managing his problems  Repeat referral to Hospice - now send to [facility] for home evaluation for hospice. As I strongly suspect he would benefit from this service, due to his declining nutritional status and chronic comorbid conditions with End Stage COPD / Respiratory failure.  - Check labs today, CMET (Albumin), CBC prior albumin was low 2.8, improved to 4.4 with wt gain in 2019, now due again for re-check  - For  pain, increased Gabapentin frequency to 600mg  TID dosing, instead of BID, new rx sent - For breathing, able to give him a Trelegy inhaler sample today for 1 week supply trial, unfortunately limited access to medication due to financial barriers, will anticipate future referral to chronic care management for him to get financial assistance if eligible in near future once program starts  - If determined not hospice criteria, would request - resume Palliative Services, and possibly social worker consult to determine if placement is appropriate for him.  - Follow-up within 6 weeks   Meds ordered this encounter  Medications  . gabapentin (NEURONTIN) 600 MG tablet    Sig: Take 1 tablet (600 mg total) by mouth 3 (three) times daily.    Dispense:  270 tablet    Refill:  1    Dose change  . Fluticasone-Umeclidin-Vilant (TRELEGY ELLIPTA) 100-62.5-25 MCG/INH AEPB    Sig: Inhale 1 puff into the lungs daily.    Dispense:  14 each    Refill:  0    Follow up plan: Return in about 6 weeks (around 11/22/2018) for Weight loss / home care.  Form for hospice referral completed, to be faxed 10/12/18  [name], [facility] 10/11/2018, 11:22 AM

## 2018-10-12 ENCOUNTER — Encounter: Payer: Self-pay | Admitting: Family Medicine

## 2018-10-12 DIAGNOSIS — N179 Acute kidney failure, unspecified: Secondary | ICD-10-CM | POA: Insufficient documentation

## 2018-10-12 LAB — CBC WITH DIFFERENTIAL/PLATELET
Absolute Monocytes: 664 cells/uL (ref 200–950)
Basophils Absolute: 49 cells/uL (ref 0–200)
Basophils Relative: 0.6 %
Eosinophils Absolute: 262 cells/uL (ref 15–500)
Eosinophils Relative: 3.2 %
HCT: 33 % — ABNORMAL LOW (ref 38.5–50.0)
Hemoglobin: 10.9 g/dL — ABNORMAL LOW (ref 13.2–17.1)
Lymphs Abs: 1443 cells/uL (ref 850–3900)
MCH: 28.5 pg (ref 27.0–33.0)
MCHC: 33 g/dL (ref 32.0–36.0)
MCV: 86.2 fL (ref 80.0–100.0)
MPV: 11.3 fL (ref 7.5–12.5)
Monocytes Relative: 8.1 %
Neutro Abs: 5781 cells/uL (ref 1500–7800)
Neutrophils Relative %: 70.5 %
Platelets: 285 10*3/uL (ref 140–400)
RBC: 3.83 10*6/uL — ABNORMAL LOW (ref 4.20–5.80)
RDW: 14.6 % (ref 11.0–15.0)
Total Lymphocyte: 17.6 %
WBC: 8.2 10*3/uL (ref 3.8–10.8)

## 2018-10-12 LAB — COMPLETE METABOLIC PANEL WITH GFR
AG Ratio: 1.1 (calc) (ref 1.0–2.5)
ALT: 4 U/L — ABNORMAL LOW (ref 9–46)
AST: 9 U/L — ABNORMAL LOW (ref 10–35)
Albumin: 3.5 g/dL — ABNORMAL LOW (ref 3.6–5.1)
Alkaline phosphatase (APISO): 83 U/L (ref 35–144)
BUN/Creatinine Ratio: 25 (calc) — ABNORMAL HIGH (ref 6–22)
BUN: 34 mg/dL — ABNORMAL HIGH (ref 7–25)
CO2: 26 mmol/L (ref 20–32)
Calcium: 8.5 mg/dL — ABNORMAL LOW (ref 8.6–10.3)
Chloride: 102 mmol/L (ref 98–110)
Creat: 1.37 mg/dL — ABNORMAL HIGH (ref 0.70–1.25)
GFR, Est African American: 64 mL/min/{1.73_m2} (ref >=60)
GFR, Est Non African American: 55 mL/min/{1.73_m2} — ABNORMAL LOW (ref >=60)
Globulin: 3.1 g/dL (calc) (ref 1.9–3.7)
Glucose, Bld: 90 mg/dL (ref 65–139)
Potassium: 4.6 mmol/L (ref 3.5–5.3)
Sodium: 136 mmol/L (ref 135–146)
Total Bilirubin: 0.2 mg/dL (ref 0.2–1.2)
Total Protein: 6.6 g/dL (ref 6.1–8.1)

## 2018-11-28 DIAGNOSIS — S36039A Unspecified laceration of spleen, initial encounter: Secondary | ICD-10-CM | POA: Diagnosis not present

## 2018-11-28 DIAGNOSIS — K255 Chronic or unspecified gastric ulcer with perforation: Secondary | ICD-10-CM | POA: Diagnosis not present

## 2018-11-28 DIAGNOSIS — W3400XA Accidental discharge from unspecified firearms or gun, initial encounter: Secondary | ICD-10-CM | POA: Diagnosis not present

## 2018-12-02 ENCOUNTER — Other Ambulatory Visit: Payer: Self-pay | Admitting: Family Medicine

## 2018-12-02 DIAGNOSIS — J432 Centrilobular emphysema: Secondary | ICD-10-CM

## 2018-12-05 ENCOUNTER — Ambulatory Visit (INDEPENDENT_AMBULATORY_CARE_PROVIDER_SITE_OTHER): Payer: Medicare Other | Admitting: Family Medicine

## 2018-12-05 ENCOUNTER — Encounter: Payer: Self-pay | Admitting: Family Medicine

## 2018-12-05 ENCOUNTER — Other Ambulatory Visit: Payer: Self-pay

## 2018-12-05 DIAGNOSIS — N182 Chronic kidney disease, stage 2 (mild): Secondary | ICD-10-CM

## 2018-12-05 DIAGNOSIS — Z515 Encounter for palliative care: Secondary | ICD-10-CM

## 2018-12-05 DIAGNOSIS — W3400XS Accidental discharge from unspecified firearms or gun, sequela: Secondary | ICD-10-CM

## 2018-12-05 DIAGNOSIS — Z9081 Acquired absence of spleen: Secondary | ICD-10-CM

## 2018-12-05 DIAGNOSIS — I129 Hypertensive chronic kidney disease with stage 1 through stage 4 chronic kidney disease, or unspecified chronic kidney disease: Secondary | ICD-10-CM | POA: Diagnosis not present

## 2018-12-05 DIAGNOSIS — G890 Central pain syndrome: Secondary | ICD-10-CM

## 2018-12-05 DIAGNOSIS — E785 Hyperlipidemia, unspecified: Secondary | ICD-10-CM | POA: Diagnosis not present

## 2018-12-05 DIAGNOSIS — K219 Gastro-esophageal reflux disease without esophagitis: Secondary | ICD-10-CM

## 2018-12-05 DIAGNOSIS — F319 Bipolar disorder, unspecified: Secondary | ICD-10-CM

## 2018-12-05 DIAGNOSIS — S31139S Puncture wound of abdominal wall without foreign body, unspecified quadrant without penetration into peritoneal cavity, sequela: Secondary | ICD-10-CM

## 2018-12-05 DIAGNOSIS — Z931 Gastrostomy status: Secondary | ICD-10-CM

## 2018-12-05 MED ORDER — TRAZODONE HCL 300 MG PO TABS: 300.0000 mg | ORAL_TABLET | Freq: Every day | ORAL | 1 refills | Status: DC

## 2018-12-05 MED ORDER — OMEPRAZOLE 20 MG PO CPDR: 20.0000 mg | DELAYED_RELEASE_CAPSULE | Freq: Two times a day (BID) | ORAL | 1 refills | Status: DC

## 2018-12-05 MED ORDER — RISPERIDONE 1 MG PO TABS: 1.0000 mg | ORAL_TABLET | Freq: Every day | ORAL | 0 refills | Status: DC

## 2018-12-05 MED ORDER — AMLODIPINE BESYLATE 5 MG PO TABS: 5.0000 mg | ORAL_TABLET | Freq: Every day | ORAL | 1 refills | Status: DC

## 2018-12-05 MED ORDER — DULOXETINE HCL 60 MG PO CPEP: 120.0000 mg | ORAL_CAPSULE | Freq: Every day | ORAL | 1 refills | Status: DC

## 2018-12-05 MED ORDER — GABAPENTIN 300 MG PO CAPS: 600.0000 mg | ORAL_CAPSULE | Freq: Two times a day (BID) | ORAL | 1 refills | Status: DC

## 2018-12-05 MED ORDER — TERAZOSIN HCL 1 MG PO CAPS: 1.0000 mg | ORAL_CAPSULE | Freq: Every day | ORAL | 1 refills | Status: DC

## 2018-12-05 MED ORDER — ATORVASTATIN CALCIUM 80 MG PO TABS: 80.0000 mg | ORAL_TABLET | Freq: Every day | ORAL | 1 refills | Status: DC

## 2018-12-05 MED ORDER — METOPROLOL TARTRATE 25 MG PO TABS: 25.0000 mg | ORAL_TABLET | Freq: Two times a day (BID) | ORAL | 1 refills | Status: DC

## 2018-12-05 MED ORDER — THIAMINE HCL 250 MG PO TABS: 250.0000 mg | ORAL_TABLET | Freq: Every day | ORAL | 1 refills | Status: AC

## 2018-12-05 NOTE — Patient Instructions (Addendum)
No MyChart ACcess. AVS info given by phone.
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
## 2018-12-07 ENCOUNTER — Ambulatory Visit (INDEPENDENT_AMBULATORY_CARE_PROVIDER_SITE_OTHER): Payer: Medicare Other | Admitting: *Deleted

## 2018-12-07 DIAGNOSIS — N182 Chronic kidney disease, stage 2 (mild): Secondary | ICD-10-CM

## 2018-12-07 DIAGNOSIS — J9612 Chronic respiratory failure with hypercapnia: Secondary | ICD-10-CM

## 2018-12-07 DIAGNOSIS — S31139S Puncture wound of abdominal wall without foreign body, unspecified quadrant without penetration into peritoneal cavity, sequela: Secondary | ICD-10-CM

## 2018-12-07 DIAGNOSIS — I129 Hypertensive chronic kidney disease with stage 1 through stage 4 chronic kidney disease, or unspecified chronic kidney disease: Secondary | ICD-10-CM

## 2018-12-07 DIAGNOSIS — J449 Chronic obstructive pulmonary disease, unspecified: Secondary | ICD-10-CM

## 2018-12-07 DIAGNOSIS — W3400XS Accidental discharge from unspecified firearms or gun, sequela: Secondary | ICD-10-CM

## 2018-12-07 NOTE — Chronic Care Management (AMB) (Signed)
Chronic Care Management   Initial Visit Note  12/07/2018 Name: Theador Jezewski. MRN: [id number] DOB: 25-Jan-1956  Referred by: Olin Hauser, DO Reason for referral : Care Coordination (Referral for [facility]) and Chronic Care Management (COPD, CKD, Malnutrition)   Ellwood Dense Dylan Monforte. is a 63 y.o. year old male who is a primary care patient of Olin Hauser, DO. The CCM team was consulted for assistance with chronic disease management and care coordination needs.   Review of patient status, including review of consultants reports, relevant laboratory and other test results, and collaboration with appropriate care team members and the patient's provider was performed as part of comprehensive patient evaluation and provision of chronic care management services.    SDOH (Social Determinants of Health) screening performed today. See Care Plan Entry related to challenges with: Alcohol/Substance Use Stress Caregiver strain.  Subjective: Spoke with patient's caregiver [employer]. She states she is his only care giver, although her boy friend who is home with an injury may be able to assist with some of [name]'s(the patient) feedings when I go back to work. Sherry states Hospice had planned to come out before the patient's admission to the hospital, and he could really use the help.   Objective:  Wt Readings from Last 3 Encounters:  10/11/18 115 lb (52.2 kg)  06/05/18 119 lb (54 kg)  02/13/18 119 lb (54 kg)     Goals Addressed            This Visit's Progress   . I need help with [name]'s care (pt-stated)       Current Barriers:  [name] Knowledge Deficits related to how to obtain hh/hospice to assist with care . Lacks caregiver support.   Nurse Case Manager Clinical Goal(s):  [name] Over the next 14 days, patient will work with HH/Hospice agency  to address needs related to in home care of feeding tube and abdominal dressing at tube.  Interventions:  . Collaborated  with several HH, hospice agencies and PCP regarding in home care for patient . Discussed plans with patient for ongoing care management follow up and provided patient with direct contact information for care management team . Provided patient and/or caregiver with verbal educational information about [place] and Palliative care (community resource).  Patient Self Care Activities:  . Currently UNABLE TO independently give self enteral feedings related to previous CVA causing right side paralysis  Initial goal documentation         Mr. Jansson was given information about Chronic Care Management services today including:  1. CCM service includes personalized support from designated clinical staff supervised by his physician, including individualized plan of care and coordination with other care providers 2. 24/7 contact phone numbers for assistance for urgent and routine care needs. 3. Service will only be billed when office clinical staff spend 20 minutes or more in a month to coordinate care. 4. Only one practitioner may furnish and bill the service in a calendar month. 5. The patient may stop CCM services at any time (effective at the end of the month) by phone call to the office staff. 6. The patient will be responsible for cost sharing (co-pay) of up to 20% of the service fee (after annual deductible is met).  Patient agreed to services and verbal consent obtained.   The CM team will reach out to the patient again over the next 14 days.   [name] Tanmay Halteman RN, BSN Nurse Case Pharmacist, [facility]  Care Management  [phone]) Business Mobile

## 2018-12-07 NOTE — Patient Instructions (Signed)
Visit Information  Goals Addressed            This Visit's Progress   . I need help with David Hall's care (pt-stated)       Current Barriers:  [name] Knowledge Deficits related to how to obtain hh/hospice to assist with care . Lacks caregiver support.   Nurse Case Manager Clinical Goal(s):  [name] Over the next 14 days, patient will work with HH/Hospice agency  to address needs related to in home care of feeding tube and abdominal dressing at tube.  Interventions:  . Collaborated with several HH, hospice agencies and PCP regarding in home care for patient . Discussed plans with patient for ongoing care management follow up and provided patient with direct contact information for care management team . Provided patient and/or caregiver with verbal educational information about [place] and Palliative care (community resource).  Patient Self Care Activities:  . Currently UNABLE TO independently give self enteral feedings related to previous CVA causing right side paralysis  Initial goal documentation        David Hall was given information about Chronic Care Management services today including:  1. CCM service includes personalized support from designated clinical staff supervised by his physician, including individualized plan of care and coordination with other care providers 2. 24/7 contact phone numbers for assistance for urgent and routine care needs. 3. Service will only be billed when office clinical staff spend 20 minutes or more in a month to coordinate care. 4. Only one practitioner may furnish and bill the service in a calendar month. 5. The patient may stop CCM services at any time (effective at the end of the month) by phone call to the office staff. 6. The patient will be responsible for cost sharing (co-pay) of up to 20% of the service fee (after annual deductible is met).  Patient agreed to services and verbal consent obtained.   The patient verbalized  understanding of instructions provided today and declined a print copy of patient instruction materials.   The CM team will reach out to the patient again over the next 14 days.  The patient has been provided with contact information for the chronic care management team and has been advised to call with any health related questions or concerns.   [name] [place] Souter RN, BSN Nurse Case Pharmacist, [facility] Care Management  [phone]) Business Mobile

## 2018-12-08 ENCOUNTER — Other Ambulatory Visit: Payer: Self-pay

## 2018-12-08 ENCOUNTER — Other Ambulatory Visit: Payer: Self-pay | Admitting: Primary Care

## 2018-12-08 ENCOUNTER — Telehealth: Payer: Self-pay | Admitting: Primary Care

## 2018-12-08 ENCOUNTER — Telehealth: Payer: Self-pay

## 2018-12-08 DIAGNOSIS — Z515 Encounter for palliative care: Secondary | ICD-10-CM
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
## 2018-12-08 NOTE — Telephone Encounter (Signed)
Attempted to call her back. Did not reach. I left voicemail.  This is a complex situation. I spoke with [place] yesterday about this for a long time, and we also spoke with [facility] yesterday on a conference call.  Will try to speak to her later.  [name] is on PAL today and is unavailable.  [name], [facility] 12/08/2018, 11:37 AM

## 2018-12-08 NOTE — Telephone Encounter (Addendum)
David Lean, Np from [place] called with questioning concerning the referral for palliative care. She states that they attempted to reach out to the patient several times while he was admitted in the hospital. The reason for her called today was because she recently received another referral and wanted to f/u on the patient current status. The previous referral was for Hospice Care. She states from previous notes it sounds like the patient need palliative care, because he currently has a feeding tube and treatment is still taking place. I informed her that the phone call concerning the referral probably came from Care Coordination.  I verified the patient phone numbers we had in the patient chart with her, because she states that she attempted to reach out to the patient but has not been successful. I also gave her  Janci Minor RN, BSN Nurse Case Manager per her request.   David Lean, NP [place] [phone] Cellphone (She requested that we don't give to patient.)

## 2018-12-08 NOTE — Telephone Encounter (Signed)
T/c to daughter, message left.

## 2018-12-11 ENCOUNTER — Ambulatory Visit: Payer: Medicare Other | Admitting: *Deleted

## 2018-12-11 DIAGNOSIS — S31139S Puncture wound of abdominal wall without foreign body, unspecified quadrant without penetration into peritoneal cavity, sequela: Secondary | ICD-10-CM

## 2018-12-11 DIAGNOSIS — J449 Chronic obstructive pulmonary disease, unspecified: Secondary | ICD-10-CM

## 2018-12-11 DIAGNOSIS — W3400XS Accidental discharge from unspecified firearms or gun, sequela: Secondary | ICD-10-CM

## 2018-12-11 NOTE — Chronic Care Management (AMB) (Signed)
  Chronic Care Management   Follow Up Note   12/11/2018 Name: Daison Braxton. MRN: [id number] DOB: 12/07/1955  Referred by: Olin Hauser, DO Reason for referral : Chronic Care Management (End stage COPD/G-tube dressing) and Care Coordination (Homehealth/hospice)   [name] is a 63 y.o. year old male who is a primary care patient of Olin Hauser, DO. The CCM team was consulted for assistance with chronic disease management and care coordination needs.    Review of patient status, including review of consultants reports, relevant laboratory and other test results, and collaboration with appropriate care team members and the patient's provider was performed as part of comprehensive patient evaluation and provision of chronic care management services.    Spoke with patient's caregiver late this evening after she completed work. Home hospice has not been initiated and home health has not been able to be established. She stated she did not feel comfortable taking tape off patient's skin and wanted his daughter to do it. Will attempt to reach out to patient's daughter tomorrow to assist with care of g-tube site.   Goals Addressed            This Visit's Progress   . I need help with [name]'s care (pt-stated)       Current Barriers:  [name] Knowledge Deficits related to how to obtain hh/hospice to assist with care . Lacks caregiver support.   Nurse Case Manager Clinical Goal(s):  [name] Over the next 14 days, patient will work with HH/Hospice agency  to address needs related to in home care of feeding tube and abdominal dressing at tube.  Interventions:  . Collaborated with several HH, hospice agencies and PCP regarding in home care for patient . Discussed plans with patient for ongoing care management follow up and provided patient with direct contact information for care management team . Provided patient and/or caregiver with verbal educational information  about [place] and Palliative care (community resource).  [name] Spoke with [name] NP with [facility]([phone]). Tele-visit done with patient/caregiver on Friday 12/08/18. Unable to determine need for Hospice at this time related to unable to do virtual visit.  . Message left with patient's roommate [name] for patient or caregiver [name] to return call. . Received a return call from [facility] @ 1745pm. Discussed with her how to cleanse around the patient's g-tube. [name] stated she would feel better if patient's daughter would do this. Requested [name] give daughter the RNCM's contact info. . Plan to call daughter([name]) tomorrow to teach how to care for g-tube site.    Patient Self Care Activities:  . Currently UNABLE TO independently give self enteral feedings related to previous CVA causing right side paralysis  Please see past updates related to this goal by clicking on the "Past Updates" button in the selected goal          The CM team will reach out to the patient again over the next 7 days.  The patient has been provided with contact information for the chronic care management team and has been advised to call with any health related questions or concerns.    [name] Brinn Westby RN, BSN Nurse Case Pharmacist, [facility] Care [facility]  [phone]) Business Mobile

## 2018-12-11 NOTE — Patient Instructions (Signed)
Thank you allowing the Chronic Care Management Team to be a part of your care! It was a pleasure speaking with you today!   CCM (Chronic Care Management) Team   Isobella Ascher RN, BSN Nurse Care Coordinator  [phone]  Harlow Asa PharmD  Clinical Pharmacist  [phone]  Eula Fried LCSW Clinical Social Worker [phone]  Goals Addressed            This Visit's Progress   . I need help with David Hall's care (pt-stated)       Current Barriers:  David Hall Knowledge Deficits related to how to obtain hh/hospice to assist with care . Lacks caregiver support.   Nurse Case Manager Clinical Goal(s):  David Hall Over the next 14 days, patient will work with HH/Hospice agency  to address needs related to in home care of feeding tube and abdominal dressing at tube.  Interventions:  . Collaborated with several HH, hospice agencies and PCP regarding in home care for patient . Discussed plans with patient for ongoing care management follow up and provided patient with direct contact information for care management team . Provided patient and/or caregiver with verbal educational information about [place] and Palliative care (community resource).  David Hall Spoke with David Hall with [facility]([phone]). Tele-visit done with patient/caregiver on Friday 12/08/18. Unable to determine need for Hospice at this time related to unable to do virtual visit.  . Message left with patient's roommate [name] for patient or caregiver David Hall to return call. . Received a return call from David Hall [facility] @ 1745pm. Discussed with her how to cleanse around the patient's g-tube. David Hall stated she would feel better if patient's daughter would do this. Requested David Hall give daughter the RNCM's contact info. . Plan to call daughter(David Hall) tomorrow to teach how to care for g-tube site.    Patient Self Care Activities:  . Currently UNABLE TO independently give self  enteral feedings related to previous CVA causing right side paralysis  Please see past updates related to this goal by clicking on the "Past Updates" button in the selected goal         The patient verbalized understanding of instructions provided today and declined a print copy of patient instruction materials.   The CM team will reach out to the patient again over the next 7 days.  The patient has been provided with contact information for the chronic care management team and has been advised to call with any health related questions or concerns.          Gastrostomy Tube Home Guide, Adult A gastrostomy tube, or G-tube, is a tube that is inserted through the abdomen into the stomach. The tube is used to give feedings and medicines when a person is unable to eat and drink enough on his or her own. How to care for a G-tube Supplies needed  Saline solution or clean, warm water and soap.  Cotton swab or gauze.  Precut gauze bandage (dressing) and tape, if needed. Instructions 1. Wash your hands with soap and water. 2. If there is a dressing between the person's skin and the tube, remove it. 3. Check the area where the tube enters the skin. Check for problems such as: ? Redness. ? Swelling. ? Pus-like drainage. ? Extra skin growth. 4. Moisten the cotton swab with the saline solution or soap and water mixture. Gently clean around the insertion site. Remove any drainage or crusting. ? When the G-tube is first put in,  a normal saline solution or water can be used to clean the skin. ? Mild soap and warm water can be used when the skin around the G-tube site has healed. 5. If there should be a dressing between the person's skin and the tube, apply it at this time. How to flush a G-tube Flush the G-tube regularly to keep it from clogging. Flush it before and after feedings and as often as told by the health care provider. Supplies needed  Purified or sterile water, warmed. If the  person has a weak disease-fighting (immune) system, or if he or she has difficulty fighting off infections (is immunocompromised), use only sterile water. ? If you are unsure about the amount of chemical contaminants in purified or drinking water, use sterile water. ? To purify drinking water by boiling:  Boil water for at least 1 minute. Keep lid over water while it boils. Allow water to cool to room temperature before using.  60cc G-tube syringe. Instructions 1. Wash your hands with soap and water. 2. Draw up 30 mL of warm water in a syringe. 3. Connect the syringe to the tube. 4. Slowly and gently push the water into the tube. G-tube problems and solutions  If the tube comes out: ? Cover the opening with a clean dressing and tape. ? Call a health care provider right away. ? A health care provider will need to put the tube back in within 4 hours.  If there is skin or scar tissue growing where the tube enters the skin: ? Keep the area clean and dry. ? Secure the tube with tape so that the tube does not move around too much. ? Call a health care provider.  If the tube gets clogged: ? Slowly push warm water into the tube with a large syringe. ? Do not force the fluid into the tube or push an object into the tube. ? If you are not able to unclog the tube, call a health care provider right away. Follow these instructions at home: Feedings  Give feedings at room temperature.  Cover and place unused feedings in the refrigerator.  If feedings are continuous: ? Do not put more than 4 hours worth of feedings in the feeding bag. ? Stop the feedings when you need to give medicine or flush the tube. Be sure to restart the feedings. ? Make sure the person's head is above his or her stomach (upright position). This will prevent choking and discomfort.  Replace feeding bags and syringes as told by the health care provider.  Make sure the person is in the right position during and after  feedings: ? During feedings, the person's position should be in the upright position. ? After a noncontinuous feeding (bolus feeding), have the person stay in the upright position for 1 hour. General instructions  Only use syringes made for G-tubes.  Do not pull or put tension on the tube.  Clamp the tube before removing the cap or disconnecting a syringe.  Measure the length of the G-tube every day from the insertion site to the end of the tube.  If the person's G-tube has a balloon, check the fluid in the balloon every week. The amount of fluid that should be in the balloon can be found in the manufacturer's specifications.  Make sure the person takes care of his or her oral health, such as by brushing his or her teeth.  Remove excess air from the G-tube as told by the person's health  care provider. This is called "venting."  Keep the area where the tube enters the skin clean and dry.  Do not push feedings, medicines, or flushes rapidly. Contact a health care provider if:  The person with the tube has any of these problems: ? Constipation. ? Fever.  There is a large amount of fluid or mucus-like liquid leaking from the tube.  Skin or scar tissue appears to be growing where the tube enters the skin.  The length of tube from the insertion site to the G-tube gets longer. Get help right away if:  The person with the tube has any of these problems: ? Severe abdominal pain. ? Severe tenderness. ? Severe bloating. ? Nausea. ? Vomiting. ? Trouble breathing. ? Shortness of breath.  Any of these problems happen in the area where the tube enters the skin: ? Redness, irritation, swelling, or soreness. ? Pus-like discharge. ? A bad smell.  The tube is clogged and cannot be flushed.  The tube comes out. Summary  A gastrostomy tube, or G-tube, is a tube that is inserted through the abdomen into the stomach. The tube is used to give feedings and medicines when a person is  unable to eat and drink enough on his or her own.  Check and clean the insertion site daily as told by the person's health care provider.  Flush the G-tube regularly to keep it from clogging. Flush it before and after feedings and as often as told by the person's health care provider.  Keep the area where the tube enters the skin clean and dry. This information is not intended to replace advice given to you by your health care provider. Make sure you discuss any questions you have with your health care provider. Document Released: 10/04/2001 Document Revised: 02/08/2017 Document Reviewed: 09/20/2016 Elsevier Interactive Patient Education  2019 [employer].

## 2018-12-12 ENCOUNTER — Ambulatory Visit: Payer: Self-pay | Admitting: *Deleted

## 2018-12-12 DIAGNOSIS — W3400XS Accidental discharge from unspecified firearms or gun, sequela: Secondary | ICD-10-CM

## 2018-12-12 DIAGNOSIS — S31139S Puncture wound of abdominal wall without foreign body, unspecified quadrant without penetration into peritoneal cavity, sequela: Secondary | ICD-10-CM

## 2018-12-12 NOTE — Chronic Care Management (AMB) (Signed)
  Chronic Care Management   Follow Up Note   12/12/2018 Name: David Hall. MRN: [id number] DOB: Nov 22, 1955  Referred by: David Hauser, DO Reason for referral : Care Coordination (HH/Hospice/Surgery Ctr)   David Hall. is a 63 y.o. year old male who is a primary care patient of David Hauser, DO. The CCM team was consulted for assistance with chronic disease management and care coordination needs.    Review of patient status, including review of consultants reports, relevant laboratory and other test results, and collaboration with appropriate care team members and the patient's provider was performed as part of comprehensive patient evaluation and provision of chronic care management services.    Goals Addressed            This Visit's Progress   . I need help with David Hall's care (pt-stated)       Current Barriers:  [name] Knowledge Deficits related to how to obtain hh/hospice to assist with care . Lacks caregiver support.   Nurse Case Manager Clinical Goal(s):  [name] Over the next 14 days, patient will work with HH/Hospice agency  to address needs related to in home care of feeding tube and abdominal dressing at tube.  Interventions:  . Collaborated with several HH, hospice agencies and PCP regarding in home care for patient . Discussed plans with patient for ongoing care management follow up and provided patient with direct contact information for care management team . Provided patient and/or caregiver with verbal educational information about [place] and Palliative care (community resource).  [name] Spoke with David Bathe NP with [facility]([phone]). Tele-visit done with patient/caregiver on Friday 12/08/18. Unable to determine need for Hospice at this time related to unable to do virtual visit.  . Message left with patient's roommate David Hall for patient or caregiver David Hall to return call. . Received  a return call from [facility] @ 1745pm. Discussed with her how to cleanse around the patient's g-tube. David Hall stated she would feel better if patient's daughter would do this. Requested David Hall give daughter the RNCM's contact info. . Plan to call daughter(David Hall) tomorrow to teach how to care for g-tube site.  . Placed calls to David Hall patient's listed emergency contact, to guide in g-tube care instructions- Message left no return call. . Placed a call to [facility] to request clarifications of duration of g-tube use-Message left no return call  . Secure EMR messages to Palliative Care and PCP to continue to keep them in the loop of communication.   Patient Self Care Activities:  . Currently UNABLE TO independently give self enteral feedings related to previous CVA causing right side paralysis  Please see past updates related to this goal by clicking on the "Past Updates" button in the selected goal          The CM team will reach out to the patient again over the next 7 days.  The patient has been provided with contact information for the chronic care management team and has been advised to call with any health related questions or concerns.    [name] Laquiesha Piacente RN, BSN Nurse Case Pharmacist, [facility]  [phone]) Business Mobile

## 2018-12-13 ENCOUNTER — Ambulatory Visit: Payer: Self-pay | Admitting: *Deleted

## 2018-12-13 DIAGNOSIS — Z515 Encounter for palliative care: Secondary | ICD-10-CM

## 2018-12-13 DIAGNOSIS — W3400XS Accidental discharge from unspecified firearms or gun, sequela: Secondary | ICD-10-CM

## 2018-12-13 DIAGNOSIS — S36039A Unspecified laceration of spleen, initial encounter: Secondary | ICD-10-CM | POA: Diagnosis not present

## 2018-12-13 DIAGNOSIS — S31139S Puncture wound of abdominal wall without foreign body, unspecified quadrant without penetration into peritoneal cavity, sequela: Secondary | ICD-10-CM

## 2018-12-13 DIAGNOSIS — K255 Chronic or unspecified gastric ulcer with perforation: Secondary | ICD-10-CM | POA: Diagnosis not present

## 2018-12-13 DIAGNOSIS — W3400XA Accidental discharge from unspecified firearms or gun, initial encounter: Secondary | ICD-10-CM | POA: Diagnosis not present

## 2018-12-13 NOTE — Chronic Care Management (AMB) (Signed)
Chronic Care Management   Follow Up Note   12/13/2018 Name: David Hall. MRN: [id number] DOB: Oct 21, 1955  Referred by: Olin Hauser, DO Reason for referral : No chief complaint on file.   David Kurtis Anastasia. is a 63 y.o. year old male who is a primary care patient of Olin Hauser, DO. The CCM team was consulted for assistance with chronic disease management and care coordination needs.    Review of patient status, including review of consultants reports, relevant laboratory and other test results, and collaboration with appropriate care team members and the patient's provider was performed as part of comprehensive patient evaluation and provision of chronic care management services.    Goals Addressed            This Visit's Progress   . I need help with [name]'s care (pt-stated)       Current Barriers:  [name] Knowledge Deficits related to how to obtain hh/hospice to assist with care . Lacks caregiver support.   Nurse Case Manager Clinical Goal(s):  [name] Over the next 14 days, patient will work with HH/Hospice agency  to address needs related to in home care of feeding tube and abdominal dressing at tube.  Interventions:  . Collaborated with several HH, hospice agencies and PCP regarding in home care for patient . Discussed plans with patient for ongoing care management follow up and provided patient with direct contact information for care management team . Provided patient and/or caregiver with verbal educational information about [place] and Palliative care (community resource).  [name] Spoke with [name] NP with [facility] Palliative [phone]). Tele-visit done with patient/caregiver on Friday 12/08/18. Unable to determine need for Hospice at this time related to unable to do virtual visit.  . Message left with patient's roommate [name] for patient or caregiver [name] to return call. . Received a return call  from [facility] @ 1745pm. Discussed with her how to cleanse around the patient's g-tube. [name] stated she would feel better if patient's daughter would do this. Requested [name] give daughter the RNCM's contact info. . Plan to call daughter([name]) tomorrow to teach how to care for g-tube site.  . Placed calls to [name] patient's listed emergency contact, to guide in g-tube care instructions- Message left no return call. . Placed a call to [facility] to request clarifications of duration of g-tube use-Message left to return call . Secure EMR messages to Palliative Care and PCP to continue to keep them in the loop of communication. . Call received from [place] with [facility]. She explained the plan for the g-tube is to be removed in 6 weeks after a speech eval. She stated patient's daughter [name] was taught how to care for the tube while pt was in the hospital. Confirmed pt had no other abdominal dressings. She stated the inpatient CM was unable to set up [facility] while patient was inpatient. She stated the g-tube was a 72french mickey J/G tube for reference. Patient is receiving enteral feeding supply from [facility].  . Secure message sent to PCP and Palliative care NP, with new information.  . Palliative Care NP continuing to follow for goals of care and potential hospice referral   Patient Self Care Activities:  . Currently UNABLE TO independently give self enteral feedings related to previous CVA causing right side paralysis  Please see past updates related to this goal by clicking on the "Past Updates" button  in the selected goal          The CM team will reach out to the patient again over the next 14 days.  The patient has been provided with contact information for the chronic care management team and has been advised to call with any health related questions or concerns.   [name] Cortavius Montesinos RN, BSN Nurse Case Pharmacist, [facility]  Care Management  [phone]) Business Mobile

## 2018-12-15 ENCOUNTER — Telehealth: Payer: Self-pay | Admitting: Primary Care

## 2018-12-15 NOTE — Telephone Encounter (Signed)
Continued attempts to reach for scheduling visit. Options left on VM, request for returned call during business hours.

## 2018-12-19 ENCOUNTER — Other Ambulatory Visit: Payer: Self-pay

## 2018-12-19 ENCOUNTER — Other Ambulatory Visit: Payer: Self-pay | Admitting: Primary Care

## 2018-12-19 ENCOUNTER — Ambulatory Visit: Payer: Self-pay | Admitting: *Deleted

## 2018-12-19 DIAGNOSIS — I6930 Unspecified sequelae of cerebral infarction: Secondary | ICD-10-CM

## 2018-12-19 DIAGNOSIS — Z515 Encounter for palliative care: Secondary | ICD-10-CM | POA: Diagnosis not present

## 2018-12-19 DIAGNOSIS — Z931 Gastrostomy status: Secondary | ICD-10-CM

## 2018-12-19 NOTE — Patient Instructions (Signed)
Thank you allowing the Chronic Care Management Team to be a part of your care! It was a pleasure speaking with you today!  CCM (Chronic Care Management) Team   David Haslam RN, BSN Nurse Care Coordinator  [phone]  David Hall PharmD  Clinical Pharmacist  [phone]  David Fried LCSW Clinical Social Worker [phone]  Goals Addressed            This Visit's Progress   . I need help with David Hall's care (pt-stated)       Current Barriers:  David Hall Kitchen Knowledge Deficits related to how to obtain hh/hospice to assist with care . Lacks caregiver support.   Nurse Case Manager Clinical Goal(s):  David Hall Kitchen Over the next 14 days, patient will work with HH/Hospice agency  to address needs related to in home care of feeding tube and abdominal dressing at tube.  Interventions:  . Collaborated with several HH, hospice agencies and PCP regarding in home care for patient . Discussed plans with patient for ongoing care management follow up and provided patient with direct contact information for care management team . Provided patient and/or caregiver with verbal educational information about [place] and Palliative care (community resource).  David Hall Kitchen Spoke with David Bathe NP with [facility] Palliative services([phone]). Tele-visit done with patient/caregiver on Friday 12/08/18. Unable to determine need for Hospice at this time related to unable to do virtual visit.  . Message left with patient's roommate David Hall for patient or caregiver David Hall to return call. . Received a return call from [facility] @ 1745pm. Discussed with her how to cleanse around the patient's g-tube. [name] stated she would feel better if patient's daughter would do this. Requested Ms. Hall give daughter the RNCM's contact info. . Plan to call daughter(David Hall) tomorrow to teach how to care for g-tube site.  . Placed calls to David Hall patient's listed emergency contact, to guide in g-tube care  instructions- Message left no return call. . Placed a call to [facility] to request clarifications of duration of g-tube use-Message left to return call . Secure EMR messages to Palliative Care and PCP to continue to keep them in the loop of communication. . Call received from [place] with [facility]. She explained the plan for the g-tube is to be removed in 6 weeks after a speech eval. She stated patient's daughter David Hall was taught how to care for the tube while pt was in the hospital. Confirmed pt had no other abdominal dressings. She stated the inpatient CM was unable to set up [facility] while patient was inpatient. She stated the g-tube was a 34french mickey J/G tube for reference. Patient is receiving enteral feeding supply from [facility].  . Secure message sent to PCP and Palliative care NP, with new information.  . Palliative Care NP continuing to follow for goals of care and potential hospice referral . Received a call from [employer] NP David Hall, to discuss patient's ongoing care needs. She states at this time patient is NOT Hospice appropriate. She recommends HH to assist with speech/swallowing safety with patient, PT for mobility and nursing assess new feeding tube.  . Placed a call to [facility] to place a referral. Had to leave a message for referral coordinator. . Return call received from [facility], [phone]) patient can receive HHPT, HHST and HHRN through their company.  . Called patient to make him/caregiver aware, but no answer message left for both.  David Hall Kitchen  Placed a second call to patient to make him aware of [facility] services starting. David Hall/caregiver answered.  .     Patient Self Care Activities:  . Currently UNABLE TO independently give self enteral feedings related to previous CVA causing right side paralysis  Please see past updates related to this goal by clicking on the "Past Updates" button in the  selected goal         The patient verbalized understanding of instructions provided today and declined a print copy of patient instruction materials.   The CM team will reach out to the patient again over the next 14 days.  The patient has been provided with contact information for the chronic care management team and has been advised to call with any health related questions or concerns.
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
## 2018-12-19 NOTE — Chronic Care Management (AMB) (Signed)
Chronic Care Management   Follow Up Note   12/19/2018 Name: David Hall. MRN: [id number] DOB: 03/08/1956  Referred by: Olin Hauser, DO Reason for referral : Chronic Care Management (COPD/New Feeding Tube) and Care Coordination [facility] Needs/PT/ST)   [name] is a 63 y.o. year old male who is a primary care patient of Olin Hauser, DO. The CCM team was consulted for assistance with chronic disease management and care coordination needs.    Review of patient status, including review of consultants reports, relevant laboratory and other test results, and collaboration with appropriate care team members and the patient's provider was performed as part of comprehensive patient evaluation and provision of chronic care management services.    Collaboration with [name] NP about patient's care. Hospice will not start at this time, recommendation for [facility] services. Made arrangements for [facility] PT,ST and Nursing through [facility]. Made patient's caregiver aware this service will be starting. Caregiver stating patient continues to choose to eat although he knows it could cause him to have pneumonia.  Goals Addressed            This Visit's Progress   . I need help with [name]'s care (pt-stated)       Current Barriers:  [name] Knowledge Deficits related to how to obtain hh/hospice to assist with care . Lacks caregiver support.   Nurse Case Manager Clinical Goal(s):  [name] Over the next 14 days, patient will work with HH/Hospice agency  to address needs related to in home care of feeding tube and abdominal dressing at tube.  Interventions:  . Collaborated with several HH, hospice agencies and PCP regarding in home care for patient . Discussed plans with patient for ongoing care management follow up and provided patient with direct contact information for care management team . Provided patient and/or caregiver with verbal educational information about [place] and Palliative care (community resource).  [name] Spoke with [name] NP with [facility] services([phone]). Tele-visit done with patient/caregiver on Friday 12/08/18. Unable to determine need for Hospice at this time related to unable to do virtual visit.  . Message left with patient's roommate [name] for patient or caregiver [name] to return call. . Received a return call from [facility] @ 1745pm. Discussed with her how to cleanse around the patient's g-tube. [name] stated she would feel better if patient's daughter would do this. Requested [name] give daughter the RNCM's contact info. . Plan to call daughter([name]) tomorrow to teach how to care for g-tube site.  . Placed calls to [name] patient's listed emergency contact, to guide in g-tube care instructions- Message left no return call. . Placed a call to [facility] to request clarifications of duration of g-tube use-Message left to return call . Secure EMR messages to Palliative Care and PCP to continue to keep them in the loop of communication. . Call received from [place] with [facility]. She explained the plan for the g-tube is to be removed in 6 weeks after a speech eval. She stated patient's daughter [name] was taught how to care for the tube while pt was in the hospital. Confirmed pt had no other abdominal dressings. She stated the inpatient CM was unable to set up [facility] while patient was inpatient. She stated the g-tube was a 58french mickey J/G tube for reference. Patient is receiving enteral feeding supply from [facility].  . Secure message sent to PCP and  Palliative care NP, with new information.  . Palliative Care NP continuing to follow for goals of care and potential hospice referral . Received a call from [place] NP [place], to discuss patient's ongoing care needs. She states at this time patient is NOT Hospice  appropriate. She recommends HH to assist with speech/swallowing safety with patient, PT for mobility and nursing assess new feeding tube.  . Placed a call to [facility] to place a referral. Had to leave a message for referral coordinator. . Return call received from [facility], [phone]) patient can receive HHPT, HHST and HHRN through their company.  . Called patient to make him/caregiver aware, but no answer message left for both.  . Placed a second call to patient to make him aware of [facility] services starting. Sherri Cates/caregiver answered.  .     Patient Self Care Activities:  . Currently UNABLE TO independently give self enteral feedings related to previous CVA causing right side paralysis  Please see past updates related to this goal by clicking on the "Past Updates" button in the selected goal          The CM team will reach out to the patient again over the next 14 days.  The patient has been provided with contact information for the chronic care management team and has been advised to call with any health related questions or concerns.    [name] Aislynn Cifelli RN, BSN Nurse Case Pharmacist, [facility]  [phone]) Business Mobile

## 2018-12-20 ENCOUNTER — Inpatient Hospital Stay
Admission: EM | Admit: 2018-12-20 | Discharge: 2018-12-26 | DRG: 871 | Disposition: A | Discharge status: Hospice | Payer: Medicare Other | Attending: Internal Medicine | Admitting: Internal Medicine

## 2018-12-20 ENCOUNTER — Ambulatory Visit: Payer: Self-pay | Admitting: *Deleted

## 2018-12-20 ENCOUNTER — Other Ambulatory Visit: Payer: Self-pay

## 2018-12-20 ENCOUNTER — Encounter: Payer: Self-pay | Admitting: Emergency Medicine

## 2018-12-20 ENCOUNTER — Other Ambulatory Visit (INDEPENDENT_AMBULATORY_CARE_PROVIDER_SITE_OTHER): Payer: Medicare Other | Admitting: Family Medicine

## 2018-12-20 ENCOUNTER — Emergency Department: Payer: Medicare Other

## 2018-12-20 DIAGNOSIS — Z66 Do not resuscitate: Secondary | ICD-10-CM | POA: Diagnosis present

## 2018-12-20 DIAGNOSIS — M2550 Pain in unspecified joint: Secondary | ICD-10-CM | POA: Diagnosis not present

## 2018-12-20 DIAGNOSIS — Z8249 Family history of ischemic heart disease and other diseases of the circulatory system: Secondary | ICD-10-CM

## 2018-12-20 DIAGNOSIS — I6930 Unspecified sequelae of cerebral infarction: Secondary | ICD-10-CM

## 2018-12-20 DIAGNOSIS — I10 Essential (primary) hypertension: Secondary | ICD-10-CM | POA: Diagnosis not present

## 2018-12-20 DIAGNOSIS — W19XXXA Unspecified fall, initial encounter: Secondary | ICD-10-CM | POA: Diagnosis present

## 2018-12-20 DIAGNOSIS — Z681 Body mass index (BMI) 19 or less, adult: Secondary | ICD-10-CM | POA: Diagnosis not present

## 2018-12-20 DIAGNOSIS — E440 Moderate protein-calorie malnutrition: Secondary | ICD-10-CM | POA: Diagnosis present

## 2018-12-20 DIAGNOSIS — R531 Weakness: Secondary | ICD-10-CM

## 2018-12-20 DIAGNOSIS — J690 Pneumonitis due to inhalation of food and vomit: Secondary | ICD-10-CM | POA: Diagnosis present

## 2018-12-20 DIAGNOSIS — R6520 Severe sepsis without septic shock: Secondary | ICD-10-CM | POA: Diagnosis not present

## 2018-12-20 DIAGNOSIS — W3400XD Accidental discharge from unspecified firearms or gun, subsequent encounter: Secondary | ICD-10-CM | POA: Diagnosis not present

## 2018-12-20 DIAGNOSIS — R911 Solitary pulmonary nodule: Secondary | ICD-10-CM | POA: Diagnosis present

## 2018-12-20 DIAGNOSIS — J431 Panlobular emphysema: Secondary | ICD-10-CM | POA: Diagnosis not present

## 2018-12-20 DIAGNOSIS — Z888 Allergy status to other drugs, medicaments and biological substances status: Secondary | ICD-10-CM

## 2018-12-20 DIAGNOSIS — Z515 Encounter for palliative care: Secondary | ICD-10-CM

## 2018-12-20 DIAGNOSIS — N182 Chronic kidney disease, stage 2 (mild): Secondary | ICD-10-CM | POA: Diagnosis present

## 2018-12-20 DIAGNOSIS — A419 Sepsis, unspecified organism: Secondary | ICD-10-CM | POA: Diagnosis present

## 2018-12-20 DIAGNOSIS — R42 Dizziness and giddiness: Secondary | ICD-10-CM | POA: Diagnosis not present

## 2018-12-20 DIAGNOSIS — K219 Gastro-esophageal reflux disease without esophagitis: Secondary | ICD-10-CM | POA: Diagnosis present

## 2018-12-20 DIAGNOSIS — Z8711 Personal history of peptic ulcer disease: Secondary | ICD-10-CM

## 2018-12-20 DIAGNOSIS — Z7401 Bed confinement status: Secondary | ICD-10-CM | POA: Diagnosis not present

## 2018-12-20 DIAGNOSIS — E785 Hyperlipidemia, unspecified: Secondary | ICD-10-CM | POA: Diagnosis present

## 2018-12-20 DIAGNOSIS — Z9981 Dependence on supplemental oxygen: Secondary | ICD-10-CM

## 2018-12-20 DIAGNOSIS — Z823 Family history of stroke: Secondary | ICD-10-CM

## 2018-12-20 DIAGNOSIS — G890 Central pain syndrome: Secondary | ICD-10-CM | POA: Diagnosis present

## 2018-12-20 DIAGNOSIS — G2581 Restless legs syndrome: Secondary | ICD-10-CM | POA: Diagnosis present

## 2018-12-20 DIAGNOSIS — R4702 Dysphasia: Secondary | ICD-10-CM | POA: Diagnosis present

## 2018-12-20 DIAGNOSIS — Z931 Gastrostomy status: Secondary | ICD-10-CM

## 2018-12-20 DIAGNOSIS — I129 Hypertensive chronic kidney disease with stage 1 through stage 4 chronic kidney disease, or unspecified chronic kidney disease: Secondary | ICD-10-CM | POA: Diagnosis present

## 2018-12-20 DIAGNOSIS — E871 Hypo-osmolality and hyponatremia: Secondary | ICD-10-CM | POA: Diagnosis present

## 2018-12-20 DIAGNOSIS — R1319 Other dysphagia: Secondary | ICD-10-CM | POA: Diagnosis present

## 2018-12-20 DIAGNOSIS — Z79891 Long term (current) use of opiate analgesic: Secondary | ICD-10-CM | POA: Diagnosis not present

## 2018-12-20 DIAGNOSIS — J9601 Acute respiratory failure with hypoxia: Secondary | ICD-10-CM | POA: Diagnosis present

## 2018-12-20 DIAGNOSIS — Z20828 Contact with and (suspected) exposure to other viral communicable diseases: Secondary | ICD-10-CM | POA: Diagnosis present

## 2018-12-20 DIAGNOSIS — J9612 Chronic respiratory failure with hypercapnia: Secondary | ICD-10-CM

## 2018-12-20 DIAGNOSIS — F1910 Other psychoactive substance abuse, uncomplicated: Secondary | ICD-10-CM | POA: Diagnosis present

## 2018-12-20 DIAGNOSIS — Z79899 Other long term (current) drug therapy: Secondary | ICD-10-CM

## 2018-12-20 DIAGNOSIS — I69351 Hemiplegia and hemiparesis following cerebral infarction affecting right dominant side: Secondary | ICD-10-CM | POA: Diagnosis not present

## 2018-12-20 DIAGNOSIS — Y95 Nosocomial condition: Secondary | ICD-10-CM | POA: Diagnosis present

## 2018-12-20 DIAGNOSIS — Z7982 Long term (current) use of aspirin: Secondary | ICD-10-CM | POA: Diagnosis not present

## 2018-12-20 DIAGNOSIS — J189 Pneumonia, unspecified organism: Secondary | ICD-10-CM | POA: Diagnosis present

## 2018-12-20 DIAGNOSIS — R918 Other nonspecific abnormal finding of lung field: Secondary | ICD-10-CM | POA: Diagnosis not present

## 2018-12-20 DIAGNOSIS — R1310 Dysphagia, unspecified: Secondary | ICD-10-CM | POA: Diagnosis not present

## 2018-12-20 DIAGNOSIS — E43 Unspecified severe protein-calorie malnutrition: Secondary | ICD-10-CM

## 2018-12-20 DIAGNOSIS — F17200 Nicotine dependence, unspecified, uncomplicated: Secondary | ICD-10-CM | POA: Diagnosis present

## 2018-12-20 DIAGNOSIS — D72829 Elevated white blood cell count, unspecified: Secondary | ICD-10-CM | POA: Diagnosis not present

## 2018-12-20 DIAGNOSIS — Z9081 Acquired absence of spleen: Secondary | ICD-10-CM | POA: Diagnosis not present

## 2018-12-20 DIAGNOSIS — R0689 Other abnormalities of breathing: Secondary | ICD-10-CM | POA: Diagnosis not present

## 2018-12-20 DIAGNOSIS — F319 Bipolar disorder, unspecified: Secondary | ICD-10-CM | POA: Diagnosis present

## 2018-12-20 DIAGNOSIS — Z885 Allergy status to narcotic agent status: Secondary | ICD-10-CM

## 2018-12-20 DIAGNOSIS — W3400XA Accidental discharge from unspecified firearms or gun, initial encounter: Secondary | ICD-10-CM | POA: Diagnosis not present

## 2018-12-20 DIAGNOSIS — R5381 Other malaise: Secondary | ICD-10-CM | POA: Diagnosis not present

## 2018-12-20 DIAGNOSIS — J90 Pleural effusion, not elsewhere classified: Secondary | ICD-10-CM | POA: Diagnosis not present

## 2018-12-20 LAB — URINALYSIS, COMPLETE (UACMP) WITH MICROSCOPIC
Bacteria, UA: NONE SEEN
Bilirubin Urine: NEGATIVE
Glucose, UA: NEGATIVE mg/dL
Hgb urine dipstick: NEGATIVE
Ketones, ur: NEGATIVE mg/dL
Leukocytes,Ua: NEGATIVE
Nitrite: NEGATIVE
Protein, ur: 30 mg/dL — AB
Specific Gravity, Urine: 1.012 (ref 1.005–1.030)
pH: 6 (ref 5.0–8.0)

## 2018-12-20 LAB — CBC WITH DIFFERENTIAL/PLATELET
Abs Immature Granulocytes: 0.28 10*3/uL — ABNORMAL HIGH (ref 0.00–0.07)
Basophils Absolute: 0.1 10*3/uL (ref 0.0–0.1)
Basophils Relative: 0 %
Eosinophils Absolute: 0 10*3/uL (ref 0.0–0.5)
Eosinophils Relative: 0 %
HCT: 36 % — ABNORMAL LOW (ref 39.0–52.0)
Hemoglobin: 11.7 g/dL — ABNORMAL LOW (ref 13.0–17.0)
Immature Granulocytes: 1 %
Lymphocytes Relative: 3 %
Lymphs Abs: 1 10*3/uL (ref 0.7–4.0)
MCH: 28.8 pg (ref 26.0–34.0)
MCHC: 32.5 g/dL (ref 30.0–36.0)
MCV: 88.7 fL (ref 80.0–100.0)
Monocytes Absolute: 1.6 10*3/uL — ABNORMAL HIGH (ref 0.1–1.0)
Monocytes Relative: 6 %
Neutro Abs: 25.7 10*3/uL — ABNORMAL HIGH (ref 1.7–7.7)
Neutrophils Relative %: 90 %
Platelets: 345 10*3/uL (ref 150–400)
RBC: 4.06 MIL/uL — ABNORMAL LOW (ref 4.22–5.81)
RDW: 16 % — ABNORMAL HIGH (ref 11.5–15.5)
WBC: 28.7 10*3/uL — ABNORMAL HIGH (ref 4.0–10.5)
nRBC: 0 % (ref 0.0–0.2)

## 2018-12-20 LAB — PROTIME-INR
INR: 1.2 (ref 0.8–1.2)
Prothrombin Time: 14.7 seconds (ref 11.4–15.2)

## 2018-12-20 LAB — COMPREHENSIVE METABOLIC PANEL
ALT: 10 U/L (ref 0–44)
AST: 13 U/L — ABNORMAL LOW (ref 15–41)
Albumin: 3.4 g/dL — ABNORMAL LOW (ref 3.5–5.0)
Alkaline Phosphatase: 58 U/L (ref 38–126)
Anion gap: 7 (ref 5–15)
BUN: 18 mg/dL (ref 8–23)
CO2: 26 mmol/L (ref 22–32)
Calcium: 8.6 mg/dL — ABNORMAL LOW (ref 8.9–10.3)
Chloride: 98 mmol/L (ref 98–111)
Creatinine, Ser: 0.66 mg/dL (ref 0.61–1.24)
GFR calc Af Amer: >60 mL/min (ref >60)
GFR calc non Af Amer: >60 mL/min (ref >60)
Glucose, Bld: 88 mg/dL (ref 70–99)
Potassium: 3.8 mmol/L (ref 3.5–5.1)
Sodium: 131 mmol/L — ABNORMAL LOW (ref 135–145)
Total Bilirubin: 0.6 mg/dL (ref 0.3–1.2)
Total Protein: 6.7 g/dL (ref 6.5–8.1)

## 2018-12-20 LAB — PROCALCITONIN: Procalcitonin: 0.12 ng/mL

## 2018-12-20 LAB — LIPASE, BLOOD: Lipase: 19 U/L (ref 11–51)

## 2018-12-20 LAB — LACTIC ACID, PLASMA
Lactic Acid, Venous: 0.8 mmol/L (ref 0.5–1.9)
Lactic Acid, Venous: 1.9 mmol/L (ref 0.5–1.9)

## 2018-12-20 LAB — SARS CORONAVIRUS 2 BY RT PCR (HOSPITAL ORDER, PERFORMED IN ~~LOC~~ HOSPITAL LAB): SARS Coronavirus 2: NEGATIVE

## 2018-12-20 LAB — APTT: aPTT: 37 seconds — ABNORMAL HIGH (ref 24–36)

## 2018-12-20 MED ORDER — AMLODIPINE BESYLATE 5 MG PO TABS
5.0000 mg | ORAL_TABLET | Freq: Every day | ORAL | Status: DC
  Administered 2018-12-21 – 2018-12-26 (×6): 5 mg via ORAL
  Filled 2018-12-20 (×6): qty 1

## 2018-12-20 MED ORDER — GABAPENTIN 300 MG PO CAPS
600.0000 mg | ORAL_CAPSULE | Freq: Two times a day (BID) | ORAL | Status: DC
  Administered 2018-12-20 – 2018-12-26 (×12): 600 mg via ORAL
  Filled 2018-12-20 (×12): qty 2

## 2018-12-20 MED ORDER — VANCOMYCIN HCL 500 MG IV SOLR
500.0000 mg | Freq: Two times a day (BID) | INTRAVENOUS | Status: DC
  Administered 2018-12-21: 04:00:00 500 mg via INTRAVENOUS
  Filled 2018-12-20 (×2): qty 500

## 2018-12-20 MED ORDER — SODIUM CHLORIDE 0.9 % IV SOLN
2.0000 g | Freq: Three times a day (TID) | INTRAVENOUS | Status: DC
  Administered 2018-12-21 – 2018-12-26 (×17): 2 g via INTRAVENOUS
  Filled 2018-12-20 (×20): qty 2

## 2018-12-20 MED ORDER — ATORVASTATIN CALCIUM 20 MG PO TABS
80.0000 mg | ORAL_TABLET | Freq: Every day | ORAL | Status: DC
  Administered 2018-12-20 – 2018-12-25 (×6): 80 mg via ORAL
  Filled 2018-12-20 (×6): qty 4

## 2018-12-20 MED ORDER — DULOXETINE HCL 30 MG PO CPEP
120.0000 mg | ORAL_CAPSULE | Freq: Every day | ORAL | Status: DC
  Administered 2018-12-21 – 2018-12-26 (×6): 120 mg via ORAL
  Filled 2018-12-20 (×6): qty 4

## 2018-12-20 MED ORDER — METOPROLOL TARTRATE 25 MG PO TABS
25.0000 mg | ORAL_TABLET | Freq: Two times a day (BID) | ORAL | Status: DC
  Administered 2018-12-20 – 2018-12-26 (×12): 25 mg via ORAL
  Filled 2018-12-20 (×12): qty 1

## 2018-12-20 MED ORDER — TRAZODONE HCL 50 MG PO TABS
300.0000 mg | ORAL_TABLET | Freq: Every day | ORAL | Status: DC
  Administered 2018-12-20 – 2018-12-25 (×6): 300 mg via ORAL
  Filled 2018-12-20 (×6): qty 6

## 2018-12-20 MED ORDER — RISPERIDONE 1 MG PO TABS
1.0000 mg | ORAL_TABLET | Freq: Every day | ORAL | Status: DC
  Administered 2018-12-20 – 2018-12-25 (×6): 1 mg via ORAL
  Filled 2018-12-20 (×7): qty 1

## 2018-12-20 MED ORDER — VITAMIN B-1 100 MG PO TABS
250.0000 mg | ORAL_TABLET | Freq: Every day | ORAL | Status: DC
  Administered 2018-12-21 – 2018-12-26 (×6): 250 mg via ORAL
  Filled 2018-12-20 (×6): qty 3

## 2018-12-20 MED ORDER — SODIUM CHLORIDE 0.9 % IV SOLN
2.0000 g | Freq: Once | INTRAVENOUS | Status: AC
  Administered 2018-12-20: 2 g via INTRAVENOUS
  Filled 2018-12-20: qty 2

## 2018-12-20 MED ORDER — VANCOMYCIN HCL IN DEXTROSE 1-5 GM/200ML-% IV SOLN
1000.0000 mg | Freq: Once | INTRAVENOUS | Status: AC
  Administered 2018-12-20: 1000 mg via INTRAVENOUS
  Filled 2018-12-20: qty 200

## 2018-12-20 MED ORDER — ASPIRIN EC 81 MG PO TBEC
81.0000 mg | DELAYED_RELEASE_TABLET | Freq: Every day | ORAL | Status: DC
  Administered 2018-12-21 – 2018-12-26 (×6): 81 mg via ORAL
  Filled 2018-12-20 (×6): qty 1

## 2018-12-20 MED ORDER — PANTOPRAZOLE SODIUM 40 MG PO TBEC
40.0000 mg | DELAYED_RELEASE_TABLET | Freq: Every day | ORAL | Status: DC
  Administered 2018-12-21 – 2018-12-26 (×6): 40 mg via ORAL
  Filled 2018-12-20 (×6): qty 1

## 2018-12-20 MED ORDER — ENOXAPARIN SODIUM 40 MG/0.4ML ~~LOC~~ SOLN
40.0000 mg | SUBCUTANEOUS | Status: DC
  Administered 2018-12-20 – 2018-12-24 (×5): 40 mg via SUBCUTANEOUS
  Filled 2018-12-20 (×5): qty 0.4

## 2018-12-20 MED ORDER — SODIUM CHLORIDE 0.9 % IV SOLN: 1.0000 g | Freq: Three times a day (TID) | INTRAVENOUS | Status: DC

## 2018-12-20 MED ORDER — TERAZOSIN HCL 1 MG PO CAPS
1.0000 mg | ORAL_CAPSULE | Freq: Every day | ORAL | Status: DC
  Administered 2018-12-20 – 2018-12-25 (×6): 1 mg via ORAL
  Filled 2018-12-20 (×7): qty 1

## 2018-12-20 MED ORDER — ALBUTEROL SULFATE (2.5 MG/3ML) 0.083% IN NEBU: 2.5000 mg | INHALATION_SOLUTION | RESPIRATORY_TRACT | Status: DC | PRN

## 2018-12-20 NOTE — Progress Notes (Signed)
CODE SEPSIS - PHARMACY COMMUNICATION  **Broad Spectrum Antibiotics should be administered within 1 hour of Sepsis diagnosis**  Time Code Sepsis Called/Page Received: 15:29  Antibiotics Ordered: Cefepime and Vancomycin  Time of 1st antibiotic administration: Cefepime given at 15:50  Additional action taken by pharmacy: N/A   If necessary, Name of Provider/Nurse Contacted: N/A    Vira Blanco ,PharmD Clinical Pharmacist  12/20/2018  3:51 PM
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
## 2018-12-20 NOTE — TOC Progression Note (Signed)
Transition of Care (TOC) - Progression Note    Patient Details  Name: Azure Barrales. MRN: [id number] Date of Birth: 1955-09-28  Transition of [facility]) CM/SW Contact  Shelbie Hutching, RN Phone Number: 12/20/2018, 5:18 PM  Clinical Narrative:    [name] with OP Care Management with [facility] requests a call when patient is ready to discharge, she will follow the patient OP.  [name] reports that the patient has been eating against medical advice- he failed his swallow study at [facility].  Janci Minor # [id number].  Patient is also followed by [facility] for Palliative care.  Santiago Glad with [facility] notified.     Expected Discharge Plan: [place] Barriers to Discharge: Continued Medical Work up  Expected Discharge Plan and Services Expected Discharge Plan: [place]   Discharge Planning Services: CM Consult   Living arrangements for the past 2 months: Single Family Home                               Date [place]: 12/20/18 Time [place]: 1630 Representative spoke with at [place]: Jana Half with [facility] notified of admission   Social Determinants of Health (SDOH) Interventions    Readmission Risk Interventions No flowsheet data found.

## 2018-12-20 NOTE — ED Notes (Signed)
Pt was able to void but unable to void in urinal. Pt voided on himself. This RN has changed pt's gown at this time. This RN was unable to collect urine sample at this time.

## 2018-12-20 NOTE — Progress Notes (Signed)
Pharmacy Antibiotic Note  David Hall. is a 63 y.o. male admitted on 12/20/2018 with pneumonia.  Pharmacy has been consulted for Cefepime, Vancomyin dosing.  Plan: Cefepime 2 gm IV X 1 given in ED 5/13 @ 1600. Cefepime 2 gm IV Q8H ordered to continue on 5/14 @ 0000.  Vancomycin 1 gm IV X 1 given in ED on 5/13 @ 1625. Vancomycin 500 mg IV Q12H ordered to start on 5/14 @ 0400. No peak and trough currently.   CrCl = 68 ml/min Ke = 0.06 hr-1 T1/2 = 11.5 hrs Vd = 35.9 L   AUC = 460.2 Vanc trough = 13.5    Height: 5\' 7"  (170.2 cm) Weight: 110 lb (49.9 kg) IBW/kg (Calculated) : 66.1  Temp (24hrs), Avg:98.8 F (37.1 C), Min:98.8 F (37.1 C), Max:98.8 F (37.1 C)  Recent Labs  Lab 12/20/18 1514 12/20/18 1615  WBC 28.7*  --   CREATININE  --  0.66  LATICACIDVEN  --  0.8    Estimated Creatinine Clearance: 67.6 mL/min (by C-G formula based on SCr of 0.66 mg/dL).    Allergies  Allergen Reactions  . Quetiapine Other (See Comments)    "feels like someone is sticking pins in my legs." "makes me feel like someone is sticking needles and knives in my legs"  . Seroquel [Quetiapine Fumarate] Other (See Comments)    "feels like someone is sticking pins in my legs."  . Codeine Itching and Rash    Antimicrobials this admission:   >>    >>   Dose adjustments this admission:   Microbiology results:  BCx:   UCx:    Sputum:    MRSA PCR:   Thank you for allowing pharmacy to be a part of this patient's care.  Lanny Donoso D 12/20/2018 7:02 PM

## 2018-12-20 NOTE — ED Notes (Signed)
ED Provider [place] at bedside. 

## 2018-12-20 NOTE — ED Notes (Signed)
ED TO INPATIENT HANDOFF REPORT  ED Nurse Name and Phone #: Bralee Feldt [phone]  S Name/Age/Gender David Hall. 63 y.o. male Room/Bed: ED10A/ED10A  Code Status   Code Status: Prior  Home/SNF/Other Home A/O x3. Is this baseline?NO  Triage Complete: Triage complete  Chief Complaint alt mental status  Triage Note Pt from home via [place]. Per EMS, pt's wife called EMS due pt falling this morning, wife reports no LOC, pt recalls falling and denies hitting his head. Per pt's wife, pt is AMS today. EMS VS 166/65; 96% on 4L via [place]; 97.9 temp axillary; 84HR. Pt is A/O to self and place a this time.    Allergies Allergies  Allergen Reactions  . Quetiapine Other (See Comments)    "feels like someone is sticking pins in my legs." "makes me feel like someone is sticking needles and knives in my legs"  . Seroquel [Quetiapine Fumarate] Other (See Comments)    "feels like someone is sticking pins in my legs."  . Codeine Itching and Rash    Level of Care/Admitting Diagnosis ED Disposition    ED Disposition Condition [facility] Area: [place] [[id number]]  Level of Care: Med-Surg [16]  Covid Evaluation: N/A  Diagnosis: Pneumonia [409811]  Admitting Physician: Max Sane [[id number]]  Attending Physician: Max Sane [[id number]]  Estimated length of stay: past midnight tomorrow  Certification:: I certify this patient will need inpatient services for at least 2 midnights  PT Class (Do Not Modify): Inpatient [101]  PT Acc Code (Do Not Modify): Private [1]       B Medical/Surgery History Past Medical History:  Diagnosis Date  . Bipolar disorder ([place])   . GERD (gastroesophageal reflux disease)   . Hyperlipidemia   . Hypertension   . Insomnia, controlled   . Obesity   . Oxygen deficiency    2 LNC at night  . Shortness of breath   . Stroke [facility]) 2014   R sided hemiplegia  . Substance abuse ([place]) 2016   cocaine use.   Past Surgical History:   Procedure Laterality Date  . CARPAL TUNNEL RELEASE    . INTRAMEDULLARY (IM) NAIL INTERTROCHANTERIC Right 09/21/2017   Procedure: INTRAMEDULLARY (IM) NAIL INTERTROCHANTRIC;  Surgeon: Corky Mull, MD;  Location: [facility] ORS;  Service: Orthopedics;  Laterality: Right;  . TONSILLECTOMY       A IV Location/Drains/Wounds Patient Lines/Drains/Airways Status   Active Line/Drains/Airways    Name:   Placement date:   Placement time:   Site:   Days:   Peripheral IV 12/20/18 Left Antecubital   12/20/18    1517    Antecubital   less than 1   Peripheral IV 12/20/18 Right Hand   12/20/18    1520    Hand   less than 1   Incision (Closed) 09/21/17 Hip   09/21/17    1856     455          Intake/Output Last 24 hours No intake or output data in the 24 hours ending 12/20/18 1731  Labs/Imaging Results for orders placed or performed during the hospital encounter of 12/20/18 (from the past 48 hour(s))  CBC with Differential/Platelet     Status: Abnormal   Collection Time: 12/20/18  3:14 PM  Result Value Ref Range   WBC 28.7 (H) 4.0 - 10.5 K/uL   RBC 4.06 (L) 4.22 - 5.81 MIL/uL   Hemoglobin 11.7 (L) 13.0 - 17.0 g/dL   HCT 36.0 (  L) 39.0 - 52.0 %   MCV 88.7 80.0 - 100.0 fL   MCH 28.8 26.0 - 34.0 pg   MCHC 32.5 30.0 - 36.0 g/dL   RDW 16.0 (H) 11.5 - 15.5 %   Platelets 345 150 - 400 K/uL   nRBC 0.0 0.0 - 0.2 %   Neutrophils Relative % 90 %   Neutro Abs 25.7 (H) 1.7 - 7.7 K/uL   Lymphocytes Relative 3 %   Lymphs Abs 1.0 0.7 - 4.0 K/uL   Monocytes Relative 6 %   Monocytes Absolute 1.6 (H) 0.1 - 1.0 K/uL   Eosinophils Relative 0 %   Eosinophils Absolute 0.0 0.0 - 0.5 K/uL   Basophils Relative 0 %   Basophils Absolute 0.1 0.0 - 0.1 K/uL   WBC Morphology MORPHOLOGY UNREMARKABLE    Immature Granulocytes 1 %   Abs Immature Granulocytes 0.28 (H) 0.00 - 0.07 K/uL   Tear Drop Cells PRESENT    Stomatocytes PRESENT     Comment: Performed at [facility], [address] [place], [place]  [address]  Comprehensive metabolic panel     Status: Abnormal   Collection Time: 12/20/18  4:15 PM  Result Value Ref Range   Sodium 131 (L) 135 - 145 mmol/L   Potassium 3.8 3.5 - 5.1 mmol/L   Chloride 98 98 - 111 mmol/L   CO2 26 22 - 32 mmol/L   Glucose, Bld 88 70 - 99 mg/dL   BUN 18 8 - 23 mg/dL   Creatinine, Ser 0.66 0.61 - 1.24 mg/dL   Calcium 8.6 (L) 8.9 - 10.3 mg/dL   Total Protein 6.7 6.5 - 8.1 g/dL   Albumin 3.4 (L) 3.5 - 5.0 g/dL   AST 13 (L) 15 - 41 U/L   ALT 10 0 - 44 U/L   Alkaline Phosphatase 58 38 - 126 U/L   Total Bilirubin 0.6 0.3 - 1.2 mg/dL   GFR calc non Af Amer >60 >60 mL/min   GFR calc Af Amer >60 >60 mL/min   Anion gap 7 5 - 15    Comment: Performed at [facility], [address] [place], [place] [address]  Lactic acid, plasma     Status: None   Collection Time: 12/20/18  4:15 PM  Result Value Ref Range   Lactic Acid, Venous 0.8 0.5 - 1.9 mmol/L    Comment: Performed at [facility], [address] [place], [place] [address]  Lipase, blood     Status: None   Collection Time: 12/20/18  4:15 PM  Result Value Ref Range   Lipase 19 11 - 51 U/L    Comment: Performed at [facility], [address] [place], [place] [address]  Procalcitonin     Status: None   Collection Time: 12/20/18  4:15 PM  Result Value Ref Range   Procalcitonin 0.12 ng/mL    Comment:        Interpretation: PCT (Procalcitonin) <= 0.5 ng/mL: Systemic infection (sepsis) is not likely. Local bacterial infection is possible. (NOTE)       Sepsis PCT Algorithm           Lower Respiratory Tract                                      Infection PCT Algorithm    ----------------------------     ----------------------------  PCT < 0.25 ng/mL                PCT < 0.10 ng/mL         Strongly encourage             Strongly discourage   discontinuation of antibiotics    initiation of antibiotics    ----------------------------     -----------------------------        PCT 0.25 - 0.50 ng/mL            PCT 0.10 - 0.25 ng/mL               OR       >80% decrease in PCT            Discourage initiation of                                            antibiotics      Encourage discontinuation           of antibiotics    ----------------------------     -----------------------------         PCT >= 0.50 ng/mL              PCT 0.26 - 0.50 ng/mL               AND        <80% decrease in PCT             Encourage initiation of                                             antibiotics       Encourage continuation           of antibiotics    ----------------------------     -----------------------------        PCT >= 0.50 ng/mL                  PCT > 0.50 ng/mL               AND         increase in PCT                  Strongly encourage                                      initiation of antibiotics    Strongly encourage escalation           of antibiotics                                     -----------------------------                                           PCT <= 0.25 ng/mL  OR                                        > 80% decrease in PCT                                     Discontinue / Do not initiate                                             antibiotics Performed at [facility], [address] [place], [place] [address]   Protime-INR     Status: None   Collection Time: 12/20/18  4:15 PM  Result Value Ref Range   Prothrombin Time 14.7 11.4 - 15.2 seconds   INR 1.2 0.8 - 1.2    Comment: (NOTE) INR goal varies based on device and disease states. Performed at [facility], [address] [place], [place] [address]   APTT     Status: Abnormal   Collection Time: 12/20/18  4:15 PM  Result Value Ref Range   aPTT 37 (H) 24 - 36 seconds    Comment:        IF BASELINE aPTT IS ELEVATED, SUGGEST PATIENT RISK ASSESSMENT BE USED TO DETERMINE APPROPRIATE ANTICOAGULANT  THERAPY. Performed at [facility], [address], [place], [place] [address]    Dg Chest Port 1 View  Result Date: 12/20/2018 CLINICAL DATA:  Altered mental status. EXAM: PORTABLE CHEST 1 VIEW COMPARISON:  Radiograph October 11, 2017. FINDINGS: Stable cardiomediastinal silhouette. No pneumothorax is noted. Minimal left pleural effusion is noted. New airspace opacity is noted in the right upper and lower lobes with associated pleural effusion. Bony thorax is unremarkable. IMPRESSION: New large right lung opacity is noted concerning for pneumonia or asymmetric edema with associated pleural effusion. Electronically Signed   By: Marijo Conception M.D.   On: 12/20/2018 15:56    Pending Labs Unresulted Labs (From admission, onward)    Start     Ordered   12/20/18 1529  SARS Coronavirus 2 (CEPHEID- Performed in [place] hospital lab), Hosp Order  (Symptomatic Patients Labs with Precautions )  ONCE - STAT,   STAT     12/20/18 1528   12/20/18 1527  Lactic acid, plasma  STAT Now then every 3 hours,   STAT     12/20/18 1527   12/20/18 1527  Blood Culture (routine x 2)  BLOOD CULTURE X 2,   STAT     12/20/18 1527   12/20/18 1527  Urinalysis, Complete w Microscopic  ONCE - STAT,   STAT     12/20/18 1527   12/20/18 1527  Urine culture  ONCE - STAT,   STAT     12/20/18 1527   Signed and Held  Culture, blood (routine x 2) Call MD if unable to obtain prior to antibiotics being given  BLOOD CULTURE X 2,   R    Comments:  If blood cultures drawn in Emergency Department - Do not draw and cancel order    Signed and Held   Signed and Held  Culture, sputum-assessment  Once,   R     Signed and Held   Signed and Held  Gram stain  Once,   R     Signed and Held   Signed and Held  HIV antibody (Routine Screening)  Once,   R     Signed and Held   Signed and Held  Strep pneumoniae urinary antigen  Once,   R     Signed and Held   Signed and Held  CBC  (enoxaparin (LOVENOX)    CrCl >/= 30 ml/min)  Once,    R    Comments:  Baseline for enoxaparin therapy IF NOT ALREADY DRAWN.  Notify MD if PLT < 100 K.    Signed and Held   Signed and Held  Creatinine, serum  (enoxaparin (LOVENOX)    CrCl >/= 30 ml/min)  Once,   R    Comments:  Baseline for enoxaparin therapy IF NOT ALREADY DRAWN.    Signed and Held   Signed and Held  Creatinine, serum  (enoxaparin (LOVENOX)    CrCl >/= 30 ml/min)  Weekly,   R    Comments:  while on enoxaparin therapy    Signed and Held   Signed and Held  Influenza panel by PCR (type A & B)  (Influenza PCR Panel)  Once,   R     Signed and Held   Signed and Held  Respiratory Panel by PCR  (Respiratory virus panel with precautions)  Once,   R     Signed and Held          Vitals/Pain Today's Vitals   12/20/18 1504 12/20/18 1507 12/20/18 1515 12/20/18 1530  BP:   136/65 134/68  Pulse: 85  82 82  Resp:    (!) 32  Temp: 98.8 F (37.1 C)     TempSrc: Oral     SpO2: 97%  96% 97%  Weight:  49.9 kg    Height:  5\' 7"  (1.702 m)      Isolation Precautions Airborne and Contact precautions  Medications Medications  vancomycin (VANCOCIN) IVPB 1000 mg/200 mL premix (0 mg Intravenous Stopped 12/20/18 1728)  ceFEPIme (MAXIPIME) 2 g in sodium chloride 0.9 % 100 mL IVPB (0 g Intravenous Stopped 12/20/18 1626)    Mobility power wheelchair High fall risk   Focused Assessments    R Recommendations: See Admitting Provider Note  Report given to:   Additional Notes:

## 2018-12-20 NOTE — ED Notes (Signed)
Pt has been provided a meal tray and a 4 oz water/ice per pt's request. This Rn will continue to monitor pt.
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
## 2018-12-20 NOTE — ED Triage Notes (Signed)
Pt from home via [facility]. Per EMS, pt's wife called EMS due pt falling this morning, wife reports no LOC, pt recalls falling and denies hitting his head. Per pt's wife, pt is AMS today. EMS VS 166/65; 96% on 4L via [place]; 97.9 temp axillary; 84HR. Pt is A/O to self and place a this time.

## 2018-12-20 NOTE — ED Notes (Signed)
This RN attempted to call report on Room 129 w/o success. This RN was told by assigned RN she is unable to receive report and she will call back in 10 minutes to receive report.

## 2018-12-20 NOTE — Chronic Care Management (AMB) (Signed)
  Chronic Care Management   Note  12/20/2018 Name: David Hall. MRN: [id number] DOB: [birth year]  Incoming call from inpatient case manager to make me aware patient is in the ED and will be admitted with pneumonia.  Expressed to CM patient's need for speech therapy evaluation.  Placed a call to patient's caregiver Sherri. Empathetic listening provided.   Follow up plan: The CM team will reach out to the patient again over the next 14 days.  The patient has been provided with contact information for the chronic care management team and has been advised to call with any health related questions or concerns.   [name] Eisa Necaise RN, BSN Nurse Case Pharmacist, [facility]  [phone]) Business Mobile
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
## 2018-12-21 ENCOUNTER — Inpatient Hospital Stay: Payer: Medicare Other

## 2018-12-21 LAB — RESPIRATORY PANEL BY PCR

## 2018-12-21 LAB — BLOOD CULTURE ID PANEL (REFLEXED)

## 2018-12-21 LAB — MRSA PCR SCREENING: MRSA by PCR: NEGATIVE

## 2018-12-21 LAB — INFLUENZA PANEL BY PCR (TYPE A & B)
Influenza A By PCR: NEGATIVE
Influenza B By PCR: NEGATIVE

## 2018-12-21 LAB — STREP PNEUMONIAE URINARY ANTIGEN: Strep Pneumo Urinary Antigen: NEGATIVE

## 2018-12-21 MED ORDER — OSMOLITE 1.5 CAL PO LIQD
237.0000 mL | Freq: Every day | ORAL | Status: DC
  Administered 2018-12-21 – 2018-12-25 (×19): 237 mL

## 2018-12-21 MED ORDER — ACETAMINOPHEN 325 MG PO TABS
650.0000 mg | ORAL_TABLET | Freq: Four times a day (QID) | ORAL | Status: DC | PRN
  Administered 2018-12-21 – 2018-12-24 (×3): 650 mg via ORAL
  Filled 2018-12-21 (×3): qty 2

## 2018-12-21 MED ORDER — SODIUM CHLORIDE 0.9 % IV SOLN
INTRAVENOUS | Status: DC | PRN
  Administered 2018-12-21 – 2018-12-25 (×7): 250 mL via INTRAVENOUS

## 2018-12-21 MED ORDER — VANCOMYCIN HCL IN DEXTROSE 1-5 GM/200ML-% IV SOLN
1000.0000 mg | Freq: Once | INTRAVENOUS | Status: AC
  Administered 2018-12-21: 23:00:00 1000 mg via INTRAVENOUS
  Filled 2018-12-21: qty 200

## 2018-12-21 MED ORDER — FREE WATER
150.0000 mL | Freq: Every day | Status: DC
  Administered 2018-12-21 – 2018-12-25 (×19): 150 mL

## 2018-12-21 MED ORDER — VANCOMYCIN HCL IN DEXTROSE 750-5 MG/150ML-% IV SOLN
750.0000 mg | Freq: Two times a day (BID) | INTRAVENOUS | Status: DC
  Administered 2018-12-22 – 2018-12-25 (×6): 750 mg via INTRAVENOUS
  Filled 2018-12-21 (×9): qty 150

## 2018-12-21 NOTE — Consult Note (Signed)
Pharmacy Antibiotic Note  David Hall. is a 63 y.o. male admitted on 12/20/2018 with pneumonia.  Pharmacy has been consulted for vancomycin and cefepime dosing. Vancomycin was discontinued this afternoon, however, his blood has grown out GPC and we are covering for the possibility of MRSA until we know more  Plan: 1) Vancomycin 750 mg IV Q 12 hrs following a 1000mg  loading dose  Goal AUC 400-550. Expected AUC: 530 SCr used: 0.8 (rounded up)  2) continue cefepime 2 grams IV every 8 hours  Height: 5\' 7"  (170.2 cm) Weight: 110 lb (49.9 kg) IBW/kg (Calculated) : 66.1  Temp (24hrs), Avg:98.4 F (36.9 C), Min:97.9 F (36.6 C), Max:99 F (37.2 C)  Recent Labs  Lab 12/20/18 1514 12/20/18 1615 12/20/18 2035  WBC 28.7*  --   --   CREATININE  --  0.66  --   LATICACIDVEN  --  0.8 1.9    Estimated Creatinine Clearance: 67.6 mL/min (by C-G formula based on SCr of 0.66 mg/dL).    Antimicrobials this admission: vancomycin 5/13 >>  cefepime 5/13 >>   Microbiology results: 5/13 BCx: 1/4 GPC 5/13 SARS CoV-2: negative  5/14 MRSA PCR: negative  Thank you for allowing pharmacy to be a part of this patient's care.  Dallie Piles, PharmD 12/21/2018 8:10 PM

## 2018-12-21 NOTE — Progress Notes (Signed)
PHARMACY - PHYSICIAN COMMUNICATION CRITICAL VALUE ALERT - BLOOD CULTURE IDENTIFICATION (BCID)  Results for orders placed or performed during the hospital encounter of 12/20/18  Blood Culture ID Panel (Reflexed) (Collected: 12/20/2018  3:32 PM)  Result Value Ref Range   Enterococcus species NOT DETECTED NOT DETECTED   Listeria monocytogenes NOT DETECTED NOT DETECTED   Staphylococcus species NOT DETECTED NOT DETECTED   Staphylococcus aureus (BCID) NOT DETECTED NOT DETECTED   Streptococcus species NOT DETECTED NOT DETECTED   Streptococcus agalactiae NOT DETECTED NOT DETECTED   Streptococcus pneumoniae NOT DETECTED NOT DETECTED   Streptococcus pyogenes NOT DETECTED NOT DETECTED   Acinetobacter baumannii NOT DETECTED NOT DETECTED   Enterobacteriaceae species NOT DETECTED NOT DETECTED   Enterobacter cloacae complex NOT DETECTED NOT DETECTED   Escherichia coli NOT DETECTED NOT DETECTED   Klebsiella oxytoca NOT DETECTED NOT DETECTED   Klebsiella pneumoniae NOT DETECTED NOT DETECTED   Proteus species NOT DETECTED NOT DETECTED   Serratia marcescens NOT DETECTED NOT DETECTED   Haemophilus influenzae NOT DETECTED NOT DETECTED   Neisseria meningitidis NOT DETECTED NOT DETECTED   Pseudomonas aeruginosa NOT DETECTED NOT DETECTED   Candida albicans NOT DETECTED NOT DETECTED   Candida glabrata NOT DETECTED NOT DETECTED   Candida krusei NOT DETECTED NOT DETECTED   Candida parapsilosis NOT DETECTED NOT DETECTED   Candida tropicalis NOT DETECTED NOT DETECTED   1/4 GPC in aerobic bottle only. No organisms identified  Name of physician (or Provider) Contacted: Dr Jerelyn Charles  Changes to prescribed antibiotics required: restarting vancomycin  Dallie Piles, PharmD 12/21/2018  7:55 PM

## 2018-12-21 NOTE — Progress Notes (Signed)
   Chaplain received an OR to complete or update an AD. Patient awake and alert upon arrival. Chaplain provided education and the patient confirmed understanding. No questions at this time. He confirmed interest in the AD documentation and affirmed that his children would be his choice for health care agent. He stated that he does not want CPR, nor to be on a ventilator if the need should arise. He hopes to go home tomorrow and inquired about transportation. Chaplain will follow up.

## 2018-12-21 NOTE — Progress Notes (Addendum)
Patient noted to cough when eating and drinking. MD notified, Pt made NPO at this time.

## 2018-12-21 NOTE — Progress Notes (Signed)
Please note, patient is currently followed by outpatient Palliative at home. CSW Annamaria Boots made aware.  Flo Shanks BSN, RN, [facility] [employer] [phone]
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
## 2018-12-22 LAB — BASIC METABOLIC PANEL
Anion gap: 6 (ref 5–15)
BUN: 17 mg/dL (ref 8–23)
CO2: 24 mmol/L (ref 22–32)
Calcium: 8.3 mg/dL — ABNORMAL LOW (ref 8.9–10.3)
Chloride: 104 mmol/L (ref 98–111)
Creatinine, Ser: 0.57 mg/dL — ABNORMAL LOW (ref 0.61–1.24)
GFR calc Af Amer: >60 mL/min (ref >60)
GFR calc non Af Amer: >60 mL/min (ref >60)
Glucose, Bld: 141 mg/dL — ABNORMAL HIGH (ref 70–99)
Potassium: 3.8 mmol/L (ref 3.5–5.1)
Sodium: 134 mmol/L — ABNORMAL LOW (ref 135–145)

## 2018-12-22 LAB — URINE CULTURE: Culture: NO GROWTH

## 2018-12-22 LAB — HIV ANTIBODY (ROUTINE TESTING W REFLEX): HIV Screen 4th Generation wRfx: NONREACTIVE
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
## 2018-12-22 NOTE — Care Management Important Message (Signed)
Important Message  Patient Details  Name: David Hall. MRN: [id number] Date of Birth: 10/30/1955   Medicare Important Message Given:  Yes    Juliann Pulse A [place] 12/22/2018, 10:50 AM

## 2018-12-23 LAB — EXPECTORATED SPUTUM ASSESSMENT W GRAM STAIN, RFLX TO RESP C

## 2018-12-23 MED ORDER — SODIUM CHLORIDE 0.9% FLUSH
3.0000 mL | Freq: Two times a day (BID) | INTRAVENOUS | Status: DC
  Administered 2018-12-23 – 2018-12-26 (×7): 3 mL via INTRAVENOUS

## 2018-12-23 MED ORDER — DIPHENHYDRAMINE HCL 25 MG PO CAPS
25.0000 mg | ORAL_CAPSULE | Freq: Once | ORAL | Status: AC
  Administered 2018-12-23: 01:00:00 25 mg
  Filled 2018-12-23: qty 1

## 2018-12-23 MED ORDER — ORAL CARE MOUTH RINSE: 15.0000 mL | Freq: Two times a day (BID) | OROMUCOSAL | Status: DC

## 2018-12-23 MED ORDER — SODIUM CHLORIDE 0.9% FLUSH
3.0000 mL | INTRAVENOUS | Status: DC | PRN
  Administered 2018-12-23 (×2): 3 mL via INTRAVENOUS
  Filled 2018-12-23 (×2): qty 3
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
## 2018-12-23 NOTE — Consult Note (Signed)
Pharmacy Antibiotic Note  David Hall. is a 63 y.o. male admitted on 12/20/2018 with pneumonia.  Pharmacy has been consulted for vancomycin and cefepime dosing. Vancomycin was discontinued this afternoon, however, his blood has grown out GPC, BCID was negative. Follow up with cultures and sensitivities. Day 2 of abx.    Plan: 1) Vancomycin 750 mg IV Q 12 hrs following a 1000mg  loading dose  Goal AUC 400-550. Expected AUC: 530 SCr used: 0.8 (rounded up)  2) continue cefepime 2 grams IV every 8 hours  Height: 5\' 7"  (170.2 cm) Weight: 110 lb (49.9 kg) IBW/kg (Calculated) : 66.1  Temp (24hrs), Avg:98.2 F (36.8 C), Min:97.8 F (36.6 C), Max:98.4 F (36.9 C)  Recent Labs  Lab 12/20/18 1514 12/20/18 1615 12/20/18 2035 12/22/18 1246  WBC 28.7*  --   --   --   CREATININE  --  0.66  --  0.57*  LATICACIDVEN  --  0.8 1.9  --     Estimated Creatinine Clearance: 67.6 mL/min (A) (by C-G formula based on SCr of 0.57 mg/dL (L)).    Antimicrobials this admission: vancomycin 5/13 >>  cefepime 5/13 >>   Microbiology results: 5/13 BCx: 1/4 GPC 5/13 SARS CoV-2: negative  5/14 MRSA PCR: negative  Thank you for allowing pharmacy to be a part of this patient's care.  Oswald Hillock, PharmD 12/23/2018 9:55 AM
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
## 2018-12-23 NOTE — Progress Notes (Signed)
Pt. suctionned for small amt thick brownish secretions and sent to Lab.

## 2018-12-23 NOTE — Progress Notes (Signed)
Productive cough with 2 sputum specimens collected rejected by lab; consulted respiratory with suction sputum specimen obtained and sent to lab. Cefepime and Vancomcyin IV antibiotics continued. Bolus gtube feedings given with pt having loose stools post feeds/Pt concerned over continued weight loss with dietitian referral submitted. PT consult done. Pt uses condom catheter. Has right sided weakness with RUE more involved from old stroke. Thickened sips of water given and tolerated.
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
## 2018-12-24 NOTE — Progress Notes (Signed)
After MD education with risk/concerns for aspiration, pt has requested diet. Educated, reinforced and encouraged to use high risk aspirations precautions when eating, for ex, sitting straight up, taking small bites/sips, hard swallow; stop if coughing starts. States he desires diet and understands.  Condom cath discontinued with pt encouraged to use urinal/BSC. Up to chair with 1+ and tolerated well for meal. Tolerating gtube feedings well; dietician note noted with pt updated. IV antibiotics continued. Has frequent congested nonproductive cough especially this morning. Pt anticipating discharge home soon.
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
## 2018-12-24 NOTE — Progress Notes (Signed)
Nutrition Brief Note  RD received consult for enteral/tube feeding initiation/management and calorie count. RD already following patient. Please see initial assessment from 5/14. RD aware patient has lost weight and RD suspects patient is malnourished (unable to determine without completing NFPE as we are working remotely). Weight re-gain would take months of continued nutrition support and will not happen during this admission. Patient is NPO so calorie count not appropriate. Continue nutrition plan per 5/14. Will continue to follow patient during admission.  Nutrition Intervention: -Continue bolus regimen of Osmolite 1.5 Cal 1 carton 5 times daily per G-portion of G-J tube. Provides 1775 kcal, 74.5 grams of protein, 905 mL H2O daily. -Continue flushing tube with 60 mL free water before and 90 mL free water after each bolus tube feeding. -Please be sure to feed in the GASTRIC portion of the G-J tube. If bolusing into jejunal portion, this will cause discomfort and diarrhea. Call RD if you have questions about which portion of the tube is gastric [phone]).  Willey Blade, MS, RD, LDN Pager: [phone] After Hours/Weekend Pager: [phone]

## 2018-12-25 ENCOUNTER — Ambulatory Visit: Payer: Self-pay | Admitting: *Deleted

## 2018-12-25 ENCOUNTER — Telehealth: Payer: Self-pay

## 2018-12-25 DIAGNOSIS — R531 Weakness: Secondary | ICD-10-CM

## 2018-12-25 DIAGNOSIS — E43 Unspecified severe protein-calorie malnutrition: Secondary | ICD-10-CM

## 2018-12-25 LAB — CREATININE, SERUM
Creatinine, Ser: 0.5 mg/dL — ABNORMAL LOW (ref 0.61–1.24)
GFR calc Af Amer: >60 mL/min (ref >60)
GFR calc non Af Amer: >60 mL/min (ref >60)

## 2018-12-25 LAB — CULTURE, BLOOD (ROUTINE X 2)
Culture: NO GROWTH
Special Requests: ADEQUATE
Special Requests: ADEQUATE

## 2018-12-25 MED ORDER — VANCOMYCIN HCL 500 MG IV SOLR
500.0000 mg | Freq: Two times a day (BID) | INTRAVENOUS | Status: DC
  Filled 2018-12-25 (×2): qty 500

## 2018-12-25 MED ORDER — ENOXAPARIN SODIUM 30 MG/0.3ML ~~LOC~~ SOLN
30.0000 mg | SUBCUTANEOUS | Status: DC
  Administered 2018-12-25: 21:00:00 30 mg via SUBCUTANEOUS
  Filled 2018-12-25: qty 0.3

## 2018-12-25 MED ORDER — ENSURE ENLIVE PO LIQD
237.0000 mL | Freq: Two times a day (BID) | ORAL | Status: DC
  Administered 2018-12-26 (×2): 237 mL via ORAL

## 2018-12-25 NOTE — Care Management Important Message (Signed)
Important Message  Patient Details  Name: David Hall. MRN: [id number] Date of Birth: 22-Aug-1955   Medicare Important Message Given:  Yes    Sinead Hockman Jen Mow, RN 12/25/2018, 10:56 AM

## 2018-12-25 NOTE — Consult Note (Signed)
Pharmacy Antibiotic Note  David Hall. is a 63 y.o. male admitted on 12/20/2018 with pneumonia.  Pharmacy has been consulted for vancomycin and cefepime dosing. Vancomycin was discontinued this afternoon, however, his blood has grown out GPC, BCID was negative. Follow up with cultures and sensitivities. Day 6 of abx.    Plan: 1) Vancomycin 500 mg IV Q 24 hrs. Goal AUC 400-550. Expected AUC: 460.2 Css min: 13.5 SCr used: 0.5 (rounded up to 0.80)  2) continue cefepime 2 grams IV every 8 hours  Height: 5\' 7"  (170.2 cm) Weight: 110 lb (49.9 kg) IBW/kg (Calculated) : 66.1  Temp (24hrs), Avg:98 F (36.7 C), Min:97.7 F (36.5 C), Max:98.2 F (36.8 C)  Recent Labs  Lab 12/20/18 1514 12/20/18 1615 12/20/18 2035 12/22/18 1246 12/25/18 0432  WBC 28.7*  --   --   --   --   CREATININE  --  0.66  --  0.57* 0.50*  LATICACIDVEN  --  0.8 1.9  --   --     Estimated Creatinine Clearance: 67.6 mL/min (A) (by C-G formula based on SCr of 0.5 mg/dL (L)).    Antimicrobials this admission: vancomycin 5/13 >>  cefepime 5/13 >>   Microbiology results: 5/13 BCx: 1/4 GPC 5/14 BCx: NG 4 days  5/13 SARS CoV-2: negative  5/14 MRSA PCR: negative  Thank you for allowing pharmacy to be a part of this patient's care.  Rowland Lathe, PharmD 12/25/2018 8:17 AM
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
## 2018-12-25 NOTE — Chronic Care Management (AMB) (Signed)
  Chronic Care Management   Follow Up Note   12/25/2018 Name: David Hall. MRN: [id number] DOB: [birth year]  Referred by: Olin Hauser, DO Reason for referral : Care Coordination (Inpatient CM/homecare/DC) and Chronic Care Management (Caregiver concerns)   David Hall. is a 63 y.o. year old male who is a primary care patient of Olin Hauser, DO. The CCM team was consulted for assistance with chronic disease management and care coordination needs.    Review of patient status, including review of consultants reports, relevant laboratory and other test results, and collaboration with appropriate care team members and the patient's provider was performed as part of comprehensive patient evaluation and provision of chronic care management services.    Goals Addressed            This Visit's Progress   . I need help with [name]'s care (pt-stated)       Current Barriers:  [name] Knowledge Deficits related to how to obtain hh/hospice to assist with care . Lacks caregiver support.   Nurse Case Manager Clinical Goal(s):  [name] Over the next 14 days, patient will work with HH/Hospice agency  to address needs related to in home care of feeding tube and abdominal dressing at tube.  Interventions:  . Incoming call from [facility] Cates/caregiver/roomate letting me know she will be at the doctor tomorrow with her boyfriend and will not be home until after 2:30 tomorrow. She spoke with me about patient's decision to eat even though he was advised not to.   . Placed a call to inpatient CM Yancey who stated she would give the message to [place] who is assigned this patient for discharge.  . Left a VM for Candace requesting a call back tomorrow to inquire about services to be set up for the patient.  .    Patient Self Care Activities:  . Currently UNABLE TO independently give self enteral feedings related to previous CVA causing right side paralysis  Please see past  updates related to this goal by clicking on the "Past Updates" button in the selected goal          The CM team will reach out to the patient again over the next 7 days.    [name] Jahzara Slattery RN, BSN Nurse Case Pharmacist, [facility]  [phone]) [employer]

## 2018-12-25 NOTE — Progress Notes (Signed)
PHARMACIST - PHYSICIAN COMMUNICATION  CONCERNING:  Enoxaparin (Lovenox) for DVT Prophylaxis    RECOMMENDATION: Patient was prescribed enoxaprin 40mg  q24 hours for VTE prophylaxis.   Filed Weights   12/20/18 1507  Weight: 110 lb (49.9 kg)    Body mass index is 17.23 kg/m.  Estimated Creatinine Clearance: 67.6 mL/min (A) (by C-G formula based on SCr of 0.5 mg/dL (L)).   Patient is candidate for enoxaparin 30mg  every 24 hours based on weight less than 50kg for male  DESCRIPTION: Pharmacy has adjusted enoxaparin dose per [facility] policy.   Patient is now receiving enoxaparin 30mg  every 24 hours.  Rowland Lathe, PharmD Clinical Pharmacist  12/25/2018 8:31 AM

## 2018-12-25 NOTE — Progress Notes (Signed)
Physical Therapy Treatment Patient Details Name: David Hall. MRN: [id number] DOB: 01-13-1956 Today's Date: 12/25/2018    History of Present Illness 63 yo Male came to ED after recent fall; upon admission was found to have right lower lobe pneumonia which is likely related to aspiration; Patient has a PMH significant for past CVA with right sided weakness, approximately 6 years ago. He reports using rollator for short distance ambulation at home at baseline; significant PMH: HTN, HLD, Bipolar, GERD, intermittent O2 use at home;     PT Comments    Patient in bed and amenable to therapy. Patient's SpO2 on room air at start of session was 92%. Patient performed all bed mobility Mod I with use of rails; STS SBA/CGA; ambulation ~130 feet with RW, SBA/CGA. Patient's SpO2 after bout of walking was 85% on room air. With purse lip breathing for ~3 min, SpO2 returned to 92%. Patient did not complain of SOB during walking but did state that he did not think he would be able to walk for another bout. Patient will continue to benefit from skilled therapeutic services to address deficits in strength, balance, mobility, and overall function.   Patient in bed with all needs in reach. Nursing notified regarding SpO2 concerns.    Follow Up Recommendations  Home health PT;Supervision/Assistance - 24 hour     Equipment Recommendations  None recommended by PT    Recommendations for Other Services       Precautions / Restrictions Precautions Precautions: Fall Restrictions Weight Bearing Restrictions: No    Mobility  Bed Mobility Overal bed mobility: Modified Independent             General bed mobility comments: uses trapeze bar, able to transition supine to sitting and sit to supine mod I with bar; patient reports that he has a bar at home;   Transfers Overall transfer level: Needs assistance Equipment used: Rolling walker (2 wheeled) Transfers: Sit to/from Stand Sit to Stand: Min  guard;Supervision         General transfer comment: requires increased time to place RUE onto RW using LUE to assist with placement; SBA/CGA for safety with transfer;   Ambulation/Gait Ambulation/Gait assistance: Min guard Gait Distance (Feet): 130 Feet Assistive device: Rolling walker (2 wheeled) Gait Pattern/deviations: Step-through pattern;Decreased dorsiflexion - right;Narrow base of support Gait velocity: decreased   General Gait Details: Patient's limitations from hx of CVA persist; no LOB and able to negotiate obstacles without apparent difficulty.   Stairs             Wheelchair Mobility    Modified Rankin (Stroke Patients Only)       Balance Overall balance assessment: Needs assistance Sitting-balance support: No upper extremity supported;Feet supported Sitting balance-Leahy Scale: Fair     Standing balance support: During functional activity;Single extremity supported Standing balance-Leahy Scale: Poor Standing balance comment: requires RW and CGA for safety with ambulation;                             Cognition Arousal/Alertness: Awake/alert Behavior During Therapy: WFL for tasks assessed/performed Overall Cognitive Status: Within Functional Limits for tasks assessed                                        Exercises      General Comments  Pertinent Vitals/Pain Pain Score: 8  Pain Location: abdominal pain Pain Descriptors / Indicators: Aching;Sore Pain Intervention(s): Limited activity within patient's tolerance;Monitored during session    Home Living                      Prior Function            PT Goals (current goals can now be found in the care plan section) Acute Rehab PT Goals Patient Stated Goal: to get stronger and walk better PT Goal Formulation: With patient Time For Goal Achievement: 01/06/19 Potential to Achieve Goals: Good Progress towards PT goals: Progressing toward goals     Frequency    Min 2X/week      PT Plan      Co-evaluation              AM-PAC PT "6 Clicks" Mobility   Outcome Measure  Help needed turning from your back to your side while in a flat bed without using bedrails?: A Little Help needed moving from lying on your back to sitting on the side of a flat bed without using bedrails?: A Lot Help needed moving to and from a bed to a chair (including a wheelchair)?: A Little Help needed standing up from a chair using your arms (e.g., wheelchair or bedside chair)?: A Little Help needed to walk in hospital room?: A Little Help needed climbing 3-5 steps with a railing? : A Lot 6 Click Score: 16    End of Session Equipment Utilized During Treatment: Gait belt Activity Tolerance: Patient tolerated treatment well;Patient limited by fatigue Patient left: in bed;with bed alarm set;with call bell/phone within reach Nurse Communication: Mobility status PT Visit Diagnosis: Unsteadiness on feet (R26.81);Muscle weakness (generalized) (M62.81);Difficulty in walking, not elsewhere classified (R26.2)     Time: 0347-4259 PT Time Calculation (min) (ACUTE ONLY): 19 min  Charges:  $Therapeutic Activity: 8-22 mins                    Myles Gip PT, DPT [phone] 12/25/2018, 12:17 PM
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
## 2018-12-26 ENCOUNTER — Ambulatory Visit: Payer: Self-pay | Admitting: *Deleted

## 2018-12-26 ENCOUNTER — Telehealth: Payer: Self-pay | Admitting: Family Medicine

## 2018-12-26 DIAGNOSIS — E43 Unspecified severe protein-calorie malnutrition: Secondary | ICD-10-CM

## 2018-12-26 LAB — CREATININE, SERUM
Creatinine, Ser: 0.61 mg/dL (ref 0.61–1.24)
GFR calc Af Amer: >60 mL/min (ref >60)
GFR calc non Af Amer: >60 mL/min (ref >60)

## 2018-12-26 LAB — CULTURE, BLOOD (ROUTINE X 2)
Culture: NO GROWTH
Culture: NO GROWTH
Special Requests: ADEQUATE
Special Requests: ADEQUATE

## 2018-12-26 LAB — CULTURE, RESPIRATORY W GRAM STAIN: Culture: NORMAL

## 2018-12-26 MED ORDER — MORPHINE SULFATE 20 MG/5ML PO SOLN: 5.0000 mg | ORAL | 0 refills | Status: DC | PRN

## 2018-12-26 MED ORDER — MORPHINE SULFATE 15 MG PO TABS: 15.0000 mg | ORAL_TABLET | ORAL | 0 refills | Status: DC | PRN

## 2018-12-26 NOTE — Telephone Encounter (Signed)
Patient discharged from [facility] to hospice, he had palliative care [facility] previously.  Verbal order given to Hospice Triage to admit patient and I will server as attending, will receive fax from [place] at [facility] with formal orders for signature  [name], [employer] 12/26/2018, 2:15 PM

## 2018-12-26 NOTE — TOC Progression Note (Signed)
Transition of Care (TOC) - Progression Note    Patient Details  Name: David Hall. MRN: [id number] Date of Birth: [birth year]  Transition of Care [facility]) CM/SW [place], [place] Phone Number: 12/26/2018, 9:50 AM  Clinical Narrative:   CSW spoke with patient regarding discharge plan. Patient would like to discharge home with hospice services. Patient reports that he no longer wants to have feedings through tube and wants to eat. Patient would like hospice services through [facility]. Patient reports that he needs no equipment at this time. CSW notified Santiago Glad with [facility] of hospice referral.     Expected Discharge Plan: [place] Barriers to Discharge: Continued Medical Work up  Expected Discharge Plan and Services Expected Discharge Plan: [place]   Discharge Planning Services: CM Consult   Living arrangements for the past 2 months: Single Family Home Expected Discharge Date: 12/26/18                             Date HH Agency Contacted: 12/20/18 Time [place]: 1630 Representative spoke with at [place]: Jana Half with [facility] notified of admission   Social Determinants of Health (SDOH) Interventions    Readmission Risk Interventions No flowsheet data found.

## 2018-12-26 NOTE — Progress Notes (Signed)
Pt to be discharged home via ems from [place]. A/o no distress. Packet in chart.  Sl d/cd.  Dressed  In transport pack . Ems called .  Sherry Caregiver at home waiting for pt.

## 2018-12-26 NOTE — Progress Notes (Signed)
Pt left via ems at this time to home. No distress or c/o.

## 2018-12-26 NOTE — Plan of Care (Signed)
  Problem: Activity: Goal: Ability to tolerate increased activity will improve Outcome: Progressing   Problem: Clinical Measurements: Goal: Ability to maintain a body temperature in the normal range will improve Outcome: Progressing   Problem: Respiratory: Goal: Ability to maintain adequate ventilation will improve Outcome: Progressing Goal: Ability to maintain a clear airway will improve Outcome: Progressing   
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
## 2018-12-26 NOTE — Progress Notes (Signed)
SLP Cancellation Note  Patient Details Name: David Hall. MRN: [id number] DOB: 12-14-1955   Cancelled treatment:       Reason Eval/Treat Not Completed: (chart reviewed; notes reviewed). Pt is now on a regular diet w/ thin liquids per MD order; "Patient now on a regular diet per his request, has some labored breathing" per MD note yesterday. Also per MD note yesterday, "Patient wants to eat, he states that he understands he has aspiration pneumonia and will recur. Patient states that he wants to eat and be comfortable. Continue regular diet stop tube feeds.".  ST services will sign off at this time. Aspiration precautions were given to pt as posted in room. NSG updated. MD to reconsult if any needs while admitted.     Orinda Kenner, [place], CCC-SLP Aarini Slee 12/26/2018, 9:21 AM

## 2018-12-26 NOTE — Chronic Care Management (AMB) (Signed)
  Chronic Care Management   Follow Up Note   12/26/2018 Name: David Hall. MRN: [id number] DOB: 09/21/1955  Referred by: Olin Hauser, DO Reason for referral : No chief complaint on file.   David Hall. is a 63 y.o. year old male who is a primary care patient of Olin Hauser, DO. The CCM team was consulted for assistance with chronic disease management and care coordination needs.    Review of patient status, including review of consultants reports, relevant laboratory and other test results, and collaboration with appropriate care team members and the patient's provider was performed as part of comprehensive patient evaluation and provision of chronic care management services.    Goals Addressed            This Visit's Progress   . I need help with [name]'s care (pt-stated)       Current Barriers:  [name] Knowledge Deficits related to how to obtain hh/hospice to assist with care . Lacks caregiver support.   Nurse Case Manager Clinical Goal(s):  [name] Over the next 14 days, patient will work with HH/Hospice agency  to address needs related to in home care of feeding tube and abdominal dressing at tube.  Interventions:  . Incoming call from [place] at [facility], to discuss patient's discharge plan and transportation needs . Incoming call from [employer]. Made her aware patient would not be coming home til this afternoon via non emergent transport. Talked with Sherri about patient's plan to come home with Hospice. Sherri stating she was going to speak with the nurse caring for the patient also.  . Incoming from [facility] Rep. Santiago Glad stating she was able to speak to the patient at the bedside to explain Hospice services.  . Secure message sent to PCP office making them awar of patient's planned discharge from home with Hospice services.   Patient Self Care Activities:  . Currently UNABLE TO independently give self enteral  feedings related to previous CVA causing right side paralysis  Please see past updates related to this goal by clicking on the "Past Updates" button in the selected goal          The CM team will reach out to the patient again over the next 2 days.    [name] Breona Cherubin RN, BSN Nurse Case Pharmacist, [facility]  [phone]) Business Mobile
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
## 2018-12-27 ENCOUNTER — Telehealth: Payer: Self-pay

## 2018-12-27 NOTE — Telephone Encounter (Signed)
Transition Care Management Follow-up Telephone Call  Date of discharge and from where: 12/26/2018 [facility]  How have you been since you were released from the hospital? "okay he is wanting to eat real food for now so were doing a little of both, the hospice nurse comes today so we will know more then"  Any questions or concerns? No   Items Reviewed:  Did the pt receive and understand the discharge instructions provided? Yes   Medications obtained and verified? Yes   Any new allergies since your discharge? No   Dietary orders reviewed? Yes  Do you have support at home? Yes   Functional Questionnaire: (I = Independent and D = Dependent) ADLs:   Bathing/Dressing- i  Meal Prep- d  Eating- tube feeding and recently decided to eat some  Maintaining continence- i  Transferring/Ambulation-  walker  Managing Meds- d  Follow up appointments reviewed:   PCP Hospital f/u appt confirmed? Yes  Scheduled to see Dr.karamalegos via phone call on 01/04/2019 @ 10am  Patient will call if he decides to cancel this visit. They will decide after the hospice nurse comes today.    [facility] f/u appt confirmed? n/a  Are transportation arrangements needed? No   If their condition worsens, is the pt aware to call PCP or go to the Emergency Dept.? Yes  Was the patient provided with contact information for the PCP's office or ED? Yes  Was to pt encouraged to call back with questions or concerns? Yes

## 2018-12-27 NOTE — Telephone Encounter (Signed)
Received fax orders for admit to hospice with standing orders. Signed and printed copy of last H&P DC Summary to be faxed back to [facility] hospice tomorrow 12/28/18  [name], [facility] 12/27/2018, 5:00 PM

## 2018-12-28 ENCOUNTER — Ambulatory Visit: Payer: Self-pay | Admitting: *Deleted

## 2018-12-28 ENCOUNTER — Telehealth: Payer: Self-pay | Admitting: Family Medicine

## 2018-12-28 DIAGNOSIS — E43 Unspecified severe protein-calorie malnutrition: Secondary | ICD-10-CM

## 2018-12-28 DIAGNOSIS — J449 Chronic obstructive pulmonary disease, unspecified: Secondary | ICD-10-CM | POA: Diagnosis not present

## 2018-12-28 DIAGNOSIS — J431 Panlobular emphysema: Secondary | ICD-10-CM | POA: Diagnosis not present

## 2018-12-28 NOTE — Telephone Encounter (Signed)
Spoke with David Hall caregiver, hospice arrived to admit and start services and he declined because he "wanted to get better" and was told that they could do palliative care and home health instead, I advised her that this is the opposite of what the plan had been most recently we had discussed in detail previously that he has failed home health and palliative services, with decline and concerns of aspiration, now I again recommended that home hospice is my recommendation for short term trial at least maybe 1 month to see if he can regain some strength and nutrition with additional nursing support at home, among other strategies. If improving we can change it to palliative and HH in future. David Hall agrees and understands this recommendation.  Also I am willing to do oral antibiotics if needed for comfort but this should be determined by hospice for future goals of care if admissions to hospital or not and if IV antibiotic or not.  Spoke with David Minor LPN regarding this situation, she agrees as we have coordinated closely on this home situation for David Hall. She will contact [facility] today to speak with the hospice nurse and discuss our wishes to proceed with home hospice and emphasize it is a short term trial to see if he can show signs of improvement first with nursing support before we change to palliative and home health.  David Putnam, DO [facility] 12/28/2018, 12:25 PM
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
## 2018-12-28 NOTE — Telephone Encounter (Signed)
[facility] called said that they went to pt house and he refused service said that he wanted home  Health

## 2019-01-04 ENCOUNTER — Ambulatory Visit (INDEPENDENT_AMBULATORY_CARE_PROVIDER_SITE_OTHER): Payer: Medicare Other | Admitting: Family Medicine

## 2019-01-04 ENCOUNTER — Other Ambulatory Visit: Payer: Self-pay

## 2019-01-04 ENCOUNTER — Encounter: Payer: Self-pay | Admitting: Family Medicine

## 2019-01-04 DIAGNOSIS — E43 Unspecified severe protein-calorie malnutrition: Secondary | ICD-10-CM

## 2019-01-04 DIAGNOSIS — Z515 Encounter for palliative care: Secondary | ICD-10-CM | POA: Diagnosis not present

## 2019-01-04 DIAGNOSIS — J690 Pneumonitis due to inhalation of food and vomit: Secondary | ICD-10-CM | POA: Diagnosis not present

## 2019-01-04 DIAGNOSIS — J449 Chronic obstructive pulmonary disease, unspecified: Secondary | ICD-10-CM | POA: Diagnosis not present

## 2019-01-04 DIAGNOSIS — Z931 Gastrostomy status: Secondary | ICD-10-CM

## 2019-01-04 DIAGNOSIS — J9612 Chronic respiratory failure with hypercapnia: Secondary | ICD-10-CM | POA: Diagnosis not present

## 2019-01-04 DIAGNOSIS — A419 Sepsis, unspecified organism: Secondary | ICD-10-CM | POA: Insufficient documentation

## 2019-01-04 NOTE — Progress Notes (Addendum)
Subjective:    Patient ID: David Hall., male    DOB: 06/07/1956, 63 y.o.   MRN: [id number]  [name]. is a 63 y.o. male presenting on 01/04/2019 for Hospitalization Follow-up (Aspiration pneumonia)  Virtual / Telehealth Encounter - Telephone  The purpose of this virtual visit is to provide medical care while limiting exposure to the novel coronavirus (COVID19) for both patient and office staff.  Consent was obtained for remote visit:  Yes.   Answered questions that patient had about telehealth interaction:  Yes.   I discussed the limitations, risks, security and privacy concerns of performing an evaluation and management service by video/telephone. I also discussed with the patient that there may be a patient responsible charge related to this service. The patient expressed understanding and agreed to proceed.  Patient Location: Home Provider Location: [facility] (Office)   HPI   History provided by patient and caregiver, David Hall, on phone.  HOSPITAL FOLLOW-UP VISIT  Hospital/Location: [facility] Date of Admission: 12/20/18 Date of Discharge: 12/26/18 Transitions of care telephone call: Completed by Tyler Aas LPN on 7/68/08  Reason for Admission: Acute respiratory failure, Dyspnea Primary (+Secondary) Diagnosis: Acute Respiratory Failure due to aspiration pneumonia, sepsis  - Hospital H&P and Discharge Summary have been reviewed - Patient presents today 9 days  after recent hospitalization. Brief summary of recent course, patient had symptoms of acute respiratory distress and failure, had developed pneumonia from aspiration after continued to eat PO food, hospitalized, treated and discharged on to [facility].  - Today reports overall has done well after discharge. Symptoms of respiratory distress have improved. He has improved with hospice nursing care at home, he has hospital bed to help sit up, he is on morphine as needed for pain,  which is helping, there was some question from patient/caregiver that he has history of opioids, but seems to do well on the current medication. - Regarding Hospital bed, needs more nurse education and help with positioning while during eating, he is often not following instructions and lays down still at times. - He wishes to have PEG tube removed as it causes some discomfort for him and he prefers to eat regular food by mouth, he declines to use PEG tube - this was discussed in detail with hospice and is in line with his goals of care and comfort to be able to eat regular food   I have reviewed the discharge medication list, and have reconciled the current and discharge medications today.   Current Outpatient Medications:  .  albuterol (VENTOLIN HFA) 108 (90 Base) MCG/ACT inhaler, INHALE 2 PUFFS BY MOUTH FOUR TIMES DAILY AS NEEDED (Patient taking differently: Inhale 1 puff into the lungs every 4 (four) hours as needed. ), Disp: 8.5 g, Rfl: 2 .  amLODipine (NORVASC) 5 MG tablet, Take 1 tablet (5 mg total) by mouth daily. (Patient taking differently: Take 5 mg by mouth daily. Per G-tube), Disp: 90 tablet, Rfl: 1 .  aspirin EC 81 MG tablet, Take 81 mg by mouth daily., Disp: , Rfl:  .  atorvastatin (LIPITOR) 80 MG tablet, Take 1 tablet (80 mg total) by mouth at bedtime. (Patient taking differently: Take 80 mg by mouth at bedtime. Per G-tube), Disp: 90 tablet, Rfl: 1 .  DULoxetine (CYMBALTA) 60 MG capsule, Take 2 capsules (120 mg total) by mouth daily. (Patient taking differently: Take 120 mg by mouth daily. Per G-tube), Disp: 180 capsule, Rfl: 1 .  gabapentin (NEURONTIN) 300  MG capsule, Take 2 capsules (600 mg total) by mouth 2 (two) times daily. (Patient taking differently: Take 600 mg by mouth 2 (two) times daily. Per G-tube), Disp: 360 capsule, Rfl: 1 .  metoprolol tartrate (LOPRESSOR) 25 MG tablet, Take 1 tablet (25 mg total) by mouth 2 (two) times daily., Disp: 180 tablet, Rfl: 1 .  morphine  (MSIR) 15 MG tablet, Take 1 tablet (15 mg total) by mouth every 4 (four) hours as needed for severe pain., Disp: 30 tablet, Rfl: 0 .  omeprazole (PRILOSEC) 20 MG capsule, Take 1 capsule (20 mg total) by mouth 2 (two) times daily. (Patient taking differently: Take 20 mg by mouth 2 (two) times daily. Per G-tube), Disp: 180 capsule, Rfl: 1 .  risperiDONE (RISPERDAL) 1 MG tablet, Take 1 tablet (1 mg total) by mouth at bedtime. (Patient taking differently: Take 1 mg by mouth at bedtime. Per G-tube), Disp: 90 tablet, Rfl: 0 .  terazosin (HYTRIN) 1 MG capsule, Take 1 capsule (1 mg total) by mouth at bedtime. (Patient taking differently: Take 1 mg by mouth at bedtime. Per G-tube), Disp: 90 capsule, Rfl: 1 .  thiamine 250 MG tablet, Take 1 tablet (250 mg total) by mouth daily. (Patient taking differently: Take 250 mg by mouth daily. Per G-tube), Disp: 90 tablet, Rfl: 1 .  trazodone (DESYREL) 300 MG tablet, Take 1 tablet (300 mg total) by mouth at bedtime. Reported on 10/27/2015 (Patient taking differently: Take 300 mg by mouth at bedtime. Per G-tube), Disp: 90 tablet, Rfl: 1  ------------------------------------------------------------------------- Social History   Tobacco Use  . Smoking status: Current Every Day Smoker    Packs/day: 1.50    Years: 45.00    Pack years: 67.50    Types: Cigarettes  . Smokeless tobacco: Current User  . Tobacco comment: smoking 10-15 cigs daily   Substance Use Topics  . Alcohol use: No    Alcohol/week: 0.0 standard drinks    Frequency: Never  . Drug use: No    Review of Systems Per HPI unless specifically indicated above     Objective:    There were no vitals taken for this visit.  Wt Readings from Last 3 Encounters:  12/20/18 110 lb (49.9 kg)  10/11/18 115 lb (52.2 kg)  06/05/18 119 lb (54 kg)    Physical Exam   No physical exam completed. Remote virtual visit today by phone.   Results for orders placed or performed during the hospital encounter of  12/20/18  Blood Culture (routine x 2)  Result Value Ref Range   Specimen Description BLOOD BLOOD RIGHT HAND    Special Requests      BOTTLES DRAWN AEROBIC AND ANAEROBIC Blood Culture adequate volume   Culture      NO GROWTH 5 DAYS Performed at [facility], [address] [place], [place] [address]    Report Status 12/25/2018 FINAL   Blood Culture (routine x 2)  Result Value Ref Range   Specimen Description      BLOOD LEFT ANTECUBITAL Performed at [facility], [address] [place], [place] [address]    Special Requests      BOTTLES DRAWN AEROBIC AND ANAEROBIC Blood Culture adequate volume Performed at [facility], [address] [place], [place] [address]    Culture  Setup Time      GRAM POSITIVE COCCI AEROBIC BOTTLE ONLY CRITICAL RESULT CALLED TO, READ BACK BY AND VERIFIED WITH: RODNEY GRUBB 12/21/18 @ 1945  [place]    Culture (  A)     STAPHYLOCOCCUS SPECIES (COAGULASE NEGATIVE) THE SIGNIFICANCE OF ISOLATING THIS ORGANISM FROM A SINGLE SET OF BLOOD CULTURES WHEN MULTIPLE SETS ARE DRAWN IS UNCERTAIN. PLEASE NOTIFY THE MICROBIOLOGY DEPARTMENT WITHIN ONE WEEK IF SPECIATION AND SENSITIVITIES ARE REQUIRED. Performed at [facility], [place] [address] [place], [place] [address]    Report Status 12/25/2018 FINAL   Urine culture  Result Value Ref Range   Specimen Description      URINE, RANDOM Performed at [facility], [address] [place], [place] [address]    Special Requests      NONE Performed at [facility], [address] [place], [place] [address]    Culture      NO GROWTH Performed at [facility], [place] [address], [place], [place] [address]    Report Status 12/22/2018 FINAL   SARS Coronavirus 2 (CEPHEID- Performed in [facility]), [facility] Order  Result Value Ref Range   SARS Coronavirus 2 NEGATIVE NEGATIVE  Culture, sputum-assessment  Result Value Ref Range   Specimen Description SPUTUM     Special Requests NONE    Sputum evaluation      Sputum specimen not acceptable for testing.  Please recollect.   Performed at [facility], [address] [place], [place] [address]    Report Status 12/23/2018 FINAL   Respiratory Panel by PCR  Result Value Ref Range   Adenovirus NOT DETECTED NOT DETECTED   Coronavirus 229E NOT DETECTED NOT DETECTED   Coronavirus HKU1 NOT DETECTED NOT DETECTED   Coronavirus NL63 NOT DETECTED NOT DETECTED   Coronavirus OC43 NOT DETECTED NOT DETECTED   Metapneumovirus NOT DETECTED NOT DETECTED   Rhinovirus / Enterovirus NOT DETECTED NOT DETECTED   Influenza A NOT DETECTED NOT DETECTED   Influenza B NOT DETECTED NOT DETECTED   Parainfluenza Virus 1 NOT DETECTED NOT DETECTED   Parainfluenza Virus 2 NOT DETECTED NOT DETECTED   Parainfluenza Virus 3 NOT DETECTED NOT DETECTED   Parainfluenza Virus 4 NOT DETECTED NOT DETECTED   Respiratory Syncytial Virus NOT DETECTED NOT DETECTED   Bordetella pertussis NOT DETECTED NOT DETECTED   Chlamydophila pneumoniae NOT DETECTED NOT DETECTED   Mycoplasma pneumoniae NOT DETECTED NOT DETECTED  MRSA PCR Screening  Result Value Ref Range   MRSA by PCR NEGATIVE NEGATIVE  Blood Culture ID Panel (Reflexed)  Result Value Ref Range   Enterococcus species NOT DETECTED NOT DETECTED   Listeria monocytogenes NOT DETECTED NOT DETECTED   Staphylococcus species NOT DETECTED NOT DETECTED   Staphylococcus aureus (BCID) NOT DETECTED NOT DETECTED   Streptococcus species NOT DETECTED NOT DETECTED   Streptococcus agalactiae NOT DETECTED NOT DETECTED   Streptococcus pneumoniae NOT DETECTED NOT DETECTED   Streptococcus pyogenes NOT DETECTED NOT DETECTED   Acinetobacter baumannii NOT DETECTED NOT DETECTED   Enterobacteriaceae species NOT DETECTED NOT DETECTED   Enterobacter cloacae complex NOT DETECTED NOT DETECTED   Escherichia coli NOT DETECTED NOT DETECTED   Klebsiella oxytoca NOT DETECTED NOT DETECTED   Klebsiella  pneumoniae NOT DETECTED NOT DETECTED   Proteus species NOT DETECTED NOT DETECTED   Serratia marcescens NOT DETECTED NOT DETECTED   Haemophilus influenzae NOT DETECTED NOT DETECTED   Neisseria meningitidis NOT DETECTED NOT DETECTED   Pseudomonas aeruginosa NOT DETECTED NOT DETECTED   Candida albicans NOT DETECTED NOT DETECTED   Candida glabrata NOT DETECTED NOT DETECTED   Candida krusei NOT DETECTED NOT DETECTED   Candida parapsilosis NOT DETECTED NOT DETECTED  Candida tropicalis NOT DETECTED NOT DETECTED  CULTURE, BLOOD (ROUTINE X 2) w Reflex to ID Panel  Result Value Ref Range   Specimen Description BLOOD BLOOD LEFT HAND    Special Requests      BOTTLES DRAWN AEROBIC AND ANAEROBIC Blood Culture adequate volume   Culture      NO GROWTH 5 DAYS Performed at [facility], [address] [place], [place] [address]    Report Status 12/26/2018 FINAL   CULTURE, BLOOD (ROUTINE X 2) w Reflex to ID Panel  Result Value Ref Range   Specimen Description BLOOD BLOOD RIGHT HAND    Special Requests      BOTTLES DRAWN AEROBIC AND ANAEROBIC Blood Culture adequate volume   Culture      NO GROWTH 5 DAYS Performed at [facility], [address] [place], [place] [address]    Report Status 12/26/2018 FINAL   Expectorated sputum assessment w rflx to resp cult  Result Value Ref Range   Specimen Description SPUTUM    Special Requests NONE    Sputum evaluation      Sputum specimen not acceptable for testing.  Please recollect.   Performed at [facility], [address], [place], [place] [address]    Report Status 12/23/2018 FINAL   Culture, respiratory  Result Value Ref Range   Specimen Description      INDUCED SPUTUM Performed at [facility], [address] [place], [place] [address]    Special Requests      NONE Performed at [facility], [place], [place] [address]    Gram Stain      RARE WBC PRESENT, PREDOMINANTLY  MONONUCLEAR NO ORGANISMS SEEN    Culture      FEW Consistent with normal respiratory flora. Performed at [facility], [place] [address] [place], [place] [address]    Report Status 12/26/2018 FINAL   Lactic acid, plasma  Result Value Ref Range   Lactic Acid, Venous 1.9 0.5 - 1.9 mmol/L  Urinalysis, Complete w Microscopic  Result Value Ref Range   Color, Urine YELLOW (A) YELLOW   APPearance CLEAR (A) CLEAR   Specific Gravity, Urine 1.012 1.005 - 1.030   pH 6.0 5.0 - 8.0   Glucose, UA NEGATIVE NEGATIVE mg/dL   Hgb urine dipstick NEGATIVE NEGATIVE   Bilirubin Urine NEGATIVE NEGATIVE   Ketones, ur NEGATIVE NEGATIVE mg/dL   Protein, ur 30 (A) NEGATIVE mg/dL   Nitrite NEGATIVE NEGATIVE   Leukocytes,Ua NEGATIVE NEGATIVE   RBC / HPF 0-5 0 - 5 RBC/hpf   WBC, UA 0-5 0 - 5 WBC/hpf   Bacteria, UA NONE SEEN NONE SEEN   Squamous Epithelial / LPF 0-5 0 - 5   Mucus PRESENT   CBC with Differential/Platelet  Result Value Ref Range   WBC 28.7 (H) 4.0 - 10.5 K/uL   RBC 4.06 (L) 4.22 - 5.81 MIL/uL   Hemoglobin 11.7 (L) 13.0 - 17.0 g/dL   HCT 36.0 (L) 39.0 - 52.0 %   MCV 88.7 80.0 - 100.0 fL   MCH 28.8 26.0 - 34.0 pg   MCHC 32.5 30.0 - 36.0 g/dL   RDW 16.0 (H) 11.5 - 15.5 %   Platelets 345 150 - 400 K/uL   nRBC 0.0 0.0 - 0.2 %   Neutrophils Relative % 90 %   Neutro Abs 25.7 (H) 1.7 - 7.7 K/uL   Lymphocytes Relative 3 %   Lymphs Abs 1.0 0.7 -  4.0 K/uL   Monocytes Relative 6 %   Monocytes Absolute 1.6 (H) 0.1 - 1.0 K/uL   Eosinophils Relative 0 %   Eosinophils Absolute 0.0 0.0 - 0.5 K/uL   Basophils Relative 0 %   Basophils Absolute 0.1 0.0 - 0.1 K/uL   WBC Morphology MORPHOLOGY UNREMARKABLE    Immature Granulocytes 1 %   Abs Immature Granulocytes 0.28 (H) 0.00 - 0.07 K/uL   Tear Drop Cells PRESENT    Stomatocytes PRESENT   Comprehensive metabolic panel  Result Value Ref Range   Sodium 131 (L) 135 - 145 mmol/L   Potassium 3.8 3.5 - 5.1 mmol/L   Chloride 98 98 - 111 mmol/L    CO2 26 22 - 32 mmol/L   Glucose, Bld 88 70 - 99 mg/dL   BUN 18 8 - 23 mg/dL   Creatinine, Ser 0.66 0.61 - 1.24 mg/dL   Calcium 8.6 (L) 8.9 - 10.3 mg/dL   Total Protein 6.7 6.5 - 8.1 g/dL   Albumin 3.4 (L) 3.5 - 5.0 g/dL   AST 13 (L) 15 - 41 U/L   ALT 10 0 - 44 U/L   Alkaline Phosphatase 58 38 - 126 U/L   Total Bilirubin 0.6 0.3 - 1.2 mg/dL   GFR calc non Af Amer >60 >60 mL/min   GFR calc Af Amer >60 >60 mL/min   Anion gap 7 5 - 15  Lactic acid, plasma  Result Value Ref Range   Lactic Acid, Venous 0.8 0.5 - 1.9 mmol/L  Lipase, blood  Result Value Ref Range   Lipase 19 11 - 51 U/L  Procalcitonin  Result Value Ref Range   Procalcitonin 0.12 ng/mL  Protime-INR  Result Value Ref Range   Prothrombin Time 14.7 11.4 - 15.2 seconds   INR 1.2 0.8 - 1.2  APTT  Result Value Ref Range   aPTT 37 (H) 24 - 36 seconds  HIV antibody (Routine Screening)  Result Value Ref Range   HIV Screen 4th Generation wRfx Non Reactive Non Reactive  Strep pneumoniae urinary antigen  Result Value Ref Range   Strep Pneumo Urinary Antigen NEGATIVE NEGATIVE  Influenza panel by PCR (type A & B)  Result Value Ref Range   Influenza A By PCR NEGATIVE NEGATIVE   Influenza B By PCR NEGATIVE NEGATIVE  Basic metabolic panel  Result Value Ref Range   Sodium 134 (L) 135 - 145 mmol/L   Potassium 3.8 3.5 - 5.1 mmol/L   Chloride 104 98 - 111 mmol/L   CO2 24 22 - 32 mmol/L   Glucose, Bld 141 (H) 70 - 99 mg/dL   BUN 17 8 - 23 mg/dL   Creatinine, Ser 0.57 (L) 0.61 - 1.24 mg/dL   Calcium 8.3 (L) 8.9 - 10.3 mg/dL   GFR calc non Af Amer >60 >60 mL/min   GFR calc Af Amer >60 >60 mL/min   Anion gap 6 5 - 15  Creatinine, serum  Result Value Ref Range   Creatinine, Ser 0.50 (L) 0.61 - 1.24 mg/dL   GFR calc non Af Amer >60 >60 mL/min   GFR calc Af Amer >60 >60 mL/min  Creatinine, serum  Result Value Ref Range   Creatinine, Ser 0.61 0.61 - 1.24 mg/dL   GFR calc non Af Amer >60 >60 mL/min   GFR calc Af Amer >60 >60  mL/min      Assessment & Plan:   Problem List Items Addressed This Visit    Chronic respiratory failure ([place])  End stage COPD ([place])   Hospice care patient   Pneumonia - Primary   Protein-calorie malnutrition, severe ([place])    Other Visit Diagnoses    S/P percutaneous endoscopic gastrostomy (PEG) tube placement [facility])       Relevant Orders   Ambulatory referral to Interventional Radiology      Clinically improved after hospitalization for acute sepsis / aspiration pneumonia acute respiratory failure, now stabilized back to baseline respiratory status, he has known End Stage COPD and chronic resp failure at baseline. He has had difficulty over past few months as per documentation with difficulty aspiration on PO intake after his prior hospitalization and has G-tube that he prefers not to use, now admitted to hospice (was on palliative care) his goals of care include comfort and eating regular food by mouth, and his wishes are to remove G-tube, requesting this order  Plan - Discussed goals of care and plan with patient, caregiver, and also called [facility] (spoke with his nurse Colletta Maryland), and agree to proceed with plan to order G-Tube removal at [facility] by Interventional Radiology to be scheduled - Continue hospital bed and upright positioning for meals, to limit or reduce aspiration, tried to reinforce this, sometimes it seems patient is not always following or adhering to care advice - Continue with current medications PRN for symptom relief, discussed with hospice nurse, no new orders requested, notify if any updates  Considered oral antibiotics if need in future for recurrent aspiration episode. Otherwise, ultimately his plan of care or goals - would likely not include IV antibiotics or more aggressive intervention. We can consider outpatient oral antibiotic if needed for comfort.  Follow-up as planned - his next visit with hospice is next week, otherwise they are doing remote care  check in  No orders of the defined types were placed in this encounter.   Follow up plan: Return in about 4 weeks (around 02/01/2019), or if symptoms worsen or fail to improve, for hospice updates.  [name], [facility] 01/04/2019, 10:21 AM

## 2019-01-04 NOTE — Patient Instructions (Addendum)
AVS info given by phone. 

## 2019-01-04 NOTE — Addendum Note (Signed)
Addended by: Olin Hauser on: 01/04/2019 01:08 PM   Modules accepted: Level of Service

## 2019-01-05 ENCOUNTER — Other Ambulatory Visit: Payer: Self-pay | Admitting: Family Medicine

## 2019-01-05 NOTE — Telephone Encounter (Signed)
I spoke with patient/caregiver yesterday at his virtual visit, and advised them that [facility] is managing his medication that we have ordered including pain medication.  I also spoke with his hospice nurse - Colletta Maryland [phone]) and she did not have any new orders or concerns for me.  If anything is needed they should reach out to hospice - I am not sure the best patient number for them to call for medical questions - so they can check on status of pain medication. They may have a limited number of doses per week, and they need to talk to them if there is a problem with the medication.  [name], DO [facility] 01/05/2019, 12:04 PM

## 2019-01-05 NOTE — Telephone Encounter (Signed)
Pt.care taker called said that pt need refill on morphine 15mg 

## 2019-01-05 NOTE — Telephone Encounter (Signed)
Attempted to contact the pt, no answer. LMOM.

## 2019-01-08 ENCOUNTER — Telehealth: Payer: Self-pay

## 2019-01-08 ENCOUNTER — Other Ambulatory Visit: Payer: Self-pay

## 2019-01-08 DIAGNOSIS — Z931 Gastrostomy status: Secondary | ICD-10-CM

## 2019-01-08 NOTE — Telephone Encounter (Signed)
Attempted to contact [facility] to f/u on referral. No answer. LMOM to return my call.

## 2019-01-08 NOTE — Telephone Encounter (Signed)
The pt caregiver was notified. No questioning or concerns.

## 2019-01-08 NOTE — Telephone Encounter (Signed)
I spoke with someone at [facility] concerning recent referral for GI tube removal. She requested that we place an order to get the tube removed.

## 2019-01-08 NOTE — Telephone Encounter (Signed)
-----   Message from Olin Hauser, DO sent at 01/04/2019 12:27 PM EDT ----- Regarding: Check on status of G-Tube (PEG) tube removal schedule at [facility] Next week - after 01/08/19 - can you check on status of this referral order placed 01/04/19 for Interventional Radiology to remove his G-Tube/PEG Tube?  Last time we had an issue where it was not scheduled.  [name], DO [facility] 01/04/2019, 12:28 PM

## 2019-01-08 NOTE — Telephone Encounter (Signed)
-----   Message from Olin Hauser, DO sent at 01/04/2019 12:27 PM EDT ----- Regarding: Check on status of G-Tube (PEG) tube removal schedule at [facility] Next week - after 01/08/19 - can you check on status of this referral order placed 01/04/19 for Interventional Radiology to remove his G-Tube/PEG Tube?  Last time we had an issue where it was not scheduled.  [name], DO [facility] 01/04/2019, 12:28 PM

## 2019-01-10 ENCOUNTER — Other Ambulatory Visit: Payer: Self-pay | Admitting: Family Medicine

## 2019-01-10 DIAGNOSIS — G890 Central pain syndrome: Secondary | ICD-10-CM

## 2019-01-10 DIAGNOSIS — Z515 Encounter for palliative care: Secondary | ICD-10-CM

## 2019-01-10 DIAGNOSIS — J9612 Chronic respiratory failure with hypercapnia: Secondary | ICD-10-CM

## 2019-01-10 NOTE — Telephone Encounter (Signed)
Update 539pm 6/3  Received call back from [facility] triage after hours, they said that based on chart review, patient was not out of medicine on last update and due to history of opiate usage in past, they will contact the primary hospice nurse who is managing this patient, Colletta Maryland, and she will contact us tomorrow Thurs 6/4 to provide any update on what medication is needed.  I would like to know an update on the pain medication  - Which one and dosage and how often he is using the medication  - If it needs to be refilled, what is the duration that hospice recommends? Have they been filling for 1 week only, or longer?  [name], DO [facility] 01/10/2019, 5:41 PM

## 2019-01-10 NOTE — Telephone Encounter (Addendum)
I was not quite clear on the original message we received.  I contacted patient's [place] team to ask more information  I have been confused by the pain medication for this patient.  He was not on pain medication prior to hospice. I signed their standing orders which includes Morphine and pain medication to be given as needed. There was not a specific order for morphine actively on his chart, and I do not have any update from hospice on how often he is actually needing / taking the morphine.  I called [facility] and spoke with on call nurse, who will contact patient and the nurse who is requesting and will call me back later today, after hours, they have my mobile #.   [name], DO [facility] 01/10/2019, 5:28 PM

## 2019-01-10 NOTE — Telephone Encounter (Signed)
Pt hospice called said that pt was out of morphine 15 mg   Called [facility]

## 2019-01-11 ENCOUNTER — Ambulatory Visit: Payer: Self-pay | Admitting: *Deleted

## 2019-01-11 DIAGNOSIS — J690 Pneumonitis due to inhalation of food and vomit: Secondary | ICD-10-CM

## 2019-01-11 MED ORDER — MORPHINE SULFATE 15 MG PO TABS: 15.0000 mg | ORAL_TABLET | Freq: Three times a day (TID) | ORAL | 0 refills | Status: DC | PRN

## 2019-01-11 NOTE — Telephone Encounter (Signed)
Spoke with David Hall this AM 01/11/19, 830am. Confirmed dose morphine sulfate 15mg  q 8 hr PRN pain, out for 2 days, needs 2 week supply #42 tablets, then she will contact us again.  Sent rx to [facility].  [name], [facility] 01/11/2019, 8:26 AM

## 2019-01-12 ENCOUNTER — Other Ambulatory Visit: Payer: Self-pay

## 2019-01-12 ENCOUNTER — Ambulatory Visit
Admission: RE | Admit: 2019-01-12 | Discharge: 2019-01-12 | Disposition: A | Discharge status: Home / Self Care | Source: Ambulatory Visit | Attending: Family Medicine | Admitting: Family Medicine

## 2019-01-12 DIAGNOSIS — Z431 Encounter for attention to gastrostomy: Secondary | ICD-10-CM | POA: Insufficient documentation

## 2019-01-12 DIAGNOSIS — Z931 Gastrostomy status: Secondary | ICD-10-CM

## 2019-01-12 MED ORDER — LIDOCAINE VISCOUS HCL 2 % MT SOLN
OROMUCOSAL | Status: AC
  Filled 2019-01-12: qty 15

## 2019-01-12 NOTE — Discharge Instructions (Signed)
Incision and Drainage  Incision and drainage is a surgical procedure to open and drain a fluid-filled sac. The sac may be filled with pus, mucus, or blood. Examples of fluid-filled sacs that may need surgical drainage include cysts, skin infections (abscesses), and red lumps that develop from a ruptured cyst or a small abscess (boils). You may need this procedure if the affected area is large, painful, infected, or not healing well. Tell a health care provider about:  Any allergies you have.  All medicines you are taking, including vitamins, herbs, eye drops, creams, and over-the-counter medicines.  Any problems you or family members have had with anesthetic medicines.  Any blood disorders you have.  Any surgeries you have had.  Any medical conditions you have.  Whether you are pregnant or may be pregnant. What are the risks? Generally, this is a safe procedure. However, problems may occur, including:  Infection.  Bleeding.  Allergic reactions to medicines.  Scarring. What happens before the procedure?  You may need an ultrasound or other imaging tests to see how large or deep the fluid-filled sac is.  You may have blood tests to check for infection.  You may get a tetanus shot.  You may be given antibiotic medicine to help prevent infection.  Follow instructions from your health care provider about eating or drinking restrictions.  Ask your health care provider about: ? Changing or stopping your regular medicines. This is especially important if you are taking diabetes medicines or blood thinners. ? Taking medicines such as aspirin and ibuprofen. These medicines can thin your blood. Do not take these medicines before your procedure if your health care provider instructs you not to.  Plan to have someone take you home after the procedure.  If you will be going home right after the procedure, plan to have someone stay with you for 24 hours. What happens during the  procedure?  To reduce your risk of infection: ? Your health care team will wash or sanitize their hands. ? Your skin will be washed with soap.  You will be given one or more of the following: ? A medicine to help you relax (sedative). ? A medicine to numb the area (local anesthetic). ? A medicine to make you fall asleep (general anesthetic).  An incision will be made in the top of the fluid-filled sac.  The contents of the sac may be squeezed out, or a syringe or tube (catheter)may be used to empty the sac.  The catheter may be left in place for several weeks to drain any fluid. Or, your health care provider may stitch open the edges of the incision to make a long-term opening for drainage (marsupialization).  The inside of the sac may be washed out (irrigated) with a sterile solution and packed with gauze before it is covered with a bandage (dressing). The procedure may vary among health care providers and hospitals. What happens after the procedure?  Your blood pressure, heart rate, breathing rate, and blood oxygen level will be monitored often until the medicines you were given have worn off.  Do not drive for 24 hours if you received a sedative. This information is not intended to replace advice given to you by your health care provider. Make sure you discuss any questions you have with your health care provider. Document Released: 01/19/2001 Document Revised: 01/01/2016 Document Reviewed: 05/16/2015 Elsevier Interactive Patient Education  2019 [employer].
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
## 2019-01-12 NOTE — Progress Notes (Signed)
Patient here today for gtube removal per Dr Kathlene Cote, tolerated well, dressing dry and intact. Denies complaints.
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
## 2019-01-15 ENCOUNTER — Ambulatory Visit: Payer: Self-pay | Admitting: *Deleted

## 2019-01-15 DIAGNOSIS — Z515 Encounter for palliative care: Secondary | ICD-10-CM

## 2019-01-15 NOTE — Chronic Care Management (AMB) (Signed)
  Chronic Care Management   Note  01/15/2019 Name: David Hall. MRN: [id number] DOB: [birth year]  Incoming call from patient's friend and caregiver Sandrea Hughs. Caregiver stated patient was doing well. Was able to have feeding tube removed, tolerated well and is continuing to take PO's well. Removal site clean and dry.  Caregiver stated Hospice services continue and are helpful to her. Empathetic listening to caregiver as she is caring for her significant other plus patient. Encouraged her to take some time for herself. Caregiver did not identify any real needs at this time.   Follow up plan: The patient has been provided with contact information for the care management team and has been advised to call with any health related questions or concerns.  No further follow up required: Hospice involved  [place], BSN Nurse Case Pharmacist, [facility] Care Management  [phone]) Business Mobile

## 2019-01-24 ENCOUNTER — Telehealth: Payer: Self-pay

## 2019-01-24 DIAGNOSIS — Z515 Encounter for palliative care: Secondary | ICD-10-CM

## 2019-01-24 DIAGNOSIS — G890 Central pain syndrome: Secondary | ICD-10-CM

## 2019-01-24 DIAGNOSIS — J9612 Chronic respiratory failure with hypercapnia: Secondary | ICD-10-CM

## 2019-01-24 MED ORDER — MORPHINE SULFATE 15 MG PO TABS: 15.0000 mg | ORAL_TABLET | Freq: Three times a day (TID) | ORAL | 0 refills | Status: DC | PRN

## 2019-01-24 NOTE — Telephone Encounter (Addendum)
David Hall  From Hospice called stating the patient needs a refill on his Morphine 15 MG sent to the [facility]. She stated that he only got a total of 6 pills left.

## 2019-01-24 NOTE — Telephone Encounter (Signed)
Reviewed [place] CSRS PMP AWARE recent controlled rx.  Last ordered 01/10/19, for 2 week supply. Filled 6/4  Managed by hospice.  Sent refill Morphine 15mg  q 8 hr PRN pain. #42.  [name], [facility] 01/24/2019, 4:51 PM

## 2019-02-08 ENCOUNTER — Other Ambulatory Visit: Payer: Self-pay | Admitting: Family Medicine

## 2019-02-08 DIAGNOSIS — J432 Centrilobular emphysema: Secondary | ICD-10-CM

## 2019-02-12 ENCOUNTER — Other Ambulatory Visit: Payer: Self-pay

## 2019-02-12 DIAGNOSIS — Z515 Encounter for palliative care: Secondary | ICD-10-CM

## 2019-02-12 DIAGNOSIS — J9612 Chronic respiratory failure with hypercapnia: Secondary | ICD-10-CM

## 2019-02-12 DIAGNOSIS — G890 Central pain syndrome: Secondary | ICD-10-CM

## 2019-02-12 MED ORDER — MORPHINE SULFATE 15 MG PO TABS: 15.0000 mg | ORAL_TABLET | Freq: Three times a day (TID) | ORAL | 0 refills | Status: DC | PRN

## 2019-02-12 NOTE — Telephone Encounter (Signed)
Anderson Malta, RN from [facility] called requesting a refill on the pt Morphine to be sent to [facility]. Her call back number is [phone].

## 2019-02-27 ENCOUNTER — Telehealth: Payer: Self-pay

## 2019-02-27 DIAGNOSIS — G890 Central pain syndrome: Secondary | ICD-10-CM

## 2019-02-27 DIAGNOSIS — J9612 Chronic respiratory failure with hypercapnia: Secondary | ICD-10-CM

## 2019-02-27 DIAGNOSIS — Z515 Encounter for palliative care: Secondary | ICD-10-CM

## 2019-02-27 MED ORDER — MORPHINE SULFATE 15 MG PO TABS: 15.0000 mg | ORAL_TABLET | Freq: Three times a day (TID) | ORAL | 0 refills | Status: DC | PRN

## 2019-02-27 NOTE — Telephone Encounter (Signed)
[facility] for 90 pills or 30 day supply.  Checked [place] CSRS PMP AWARE for controlled rx.  Patient on hospice, reviewed faxed hospice updates recently.  [name], DO [facility] 02/27/2019, 3:40 PM

## 2019-02-27 NOTE — Telephone Encounter (Signed)
Family is getting close to being out of morphine.  Please call [facility] and can give 30 days. Hospice nurse Tammi Klippel [phone].

## 2019-02-28 ENCOUNTER — Other Ambulatory Visit: Payer: Self-pay

## 2019-02-28 DIAGNOSIS — E785 Hyperlipidemia, unspecified: Secondary | ICD-10-CM

## 2019-02-28 MED ORDER — ATORVASTATIN CALCIUM 80 MG PO TABS: 80.0000 mg | ORAL_TABLET | Freq: Every day | ORAL | 1 refills | Status: AC

## 2019-03-05 ENCOUNTER — Other Ambulatory Visit: Payer: Self-pay | Admitting: Family Medicine

## 2019-03-05 DIAGNOSIS — F319 Bipolar disorder, unspecified: Secondary | ICD-10-CM

## 2019-03-09 ENCOUNTER — Ambulatory Visit: Payer: Self-pay | Admitting: *Deleted

## 2019-03-09 DIAGNOSIS — J9612 Chronic respiratory failure with hypercapnia: Secondary | ICD-10-CM

## 2019-03-09 NOTE — Chronic Care Management (AMB) (Signed)
  Chronic Care Management   Outreach Note  03/09/2019 Name: David Hall. MRN: [id number] DOB: 11-01-1955  Referred by: Olin Hauser, DO Reason for referral : Chronic Care Management (Unsuccessful outreach returning caretakers call)   An unsuccessful telephone outreach was attempted today. The patient was referred to the case management team for assistance with chronic care management and care coordination. Patient's caretaker Sandrea Hughs called me and left a voicemail. Unable to reach her on return call.   Follow Up Plan: A HIPPA compliant phone message was left for the patient providing contact information and requesting a return call.  The care management team will reach out to the patient again over the next 14 days.    [name] Jerral Mccauley RN, BSN Nurse Case Pharmacist, [facility] Care Management  [phone]) Business Mobile

## 2019-03-12 ENCOUNTER — Ambulatory Visit: Payer: Self-pay | Admitting: *Deleted

## 2019-03-12 ENCOUNTER — Other Ambulatory Visit: Payer: Self-pay | Admitting: Family Medicine

## 2019-03-12 DIAGNOSIS — J432 Centrilobular emphysema: Secondary | ICD-10-CM

## 2019-03-12 DIAGNOSIS — Z515 Encounter for palliative care: Secondary | ICD-10-CM

## 2019-03-12 DIAGNOSIS — J9612 Chronic respiratory failure with hypercapnia: Secondary | ICD-10-CM

## 2019-03-12 NOTE — Chronic Care Management (AMB) (Signed)
  Chronic Care Management   Follow Up Note   03/12/2019 Name: Sheikh Leverich. MRN: [id number] DOB: June 10, 1956  Referred by: Olin Hauser, DO Reason for referral : Chronic Care Management (Hospice Patient)   Garrus Gauthreaux. is a 63 y.o. year old male who is a primary care patient of Olin Hauser, DO. The CCM team was consulted for assistance with chronic disease management and care coordination needs.    Review of patient status, including review of consultants reports, relevant laboratory and other test results, and collaboration with appropriate care team members and the patient's provider was performed as part of comprehensive patient evaluation and provision of chronic care management services.    Returned a phone call from patient's care giver Sandrea Hughs. Sherri stated she and her spouse [name] may be moving away from [name]'s home. She stated the plans are not definite yet, but she wanted me to be aware. Sherri stated she would keep me updated on the plans for the patient's care. Sherri stated the patient was doing well and she thinks Hospice will "turn him lose soon."  EMR reveals patient is still with Hospice, but will continue to monitor for changes in status.     The patient has been provided with contact information for the care management team and has been advised to call with any health related questions or concerns.    [name] Abdulloh Ullom RN, BSN Nurse Case Pharmacist, [facility] Care Management  [phone]) Business Mobile

## 2019-03-13 ENCOUNTER — Ambulatory Visit: Payer: Self-pay

## 2019-03-26 ENCOUNTER — Telehealth: Payer: Self-pay | Admitting: Family Medicine

## 2019-03-26 ENCOUNTER — Other Ambulatory Visit: Payer: Self-pay

## 2019-03-26 DIAGNOSIS — G890 Central pain syndrome: Secondary | ICD-10-CM

## 2019-03-26 DIAGNOSIS — Z515 Encounter for palliative care: Secondary | ICD-10-CM

## 2019-03-26 MED ORDER — GABAPENTIN 300 MG PO CAPS: 600.0000 mg | ORAL_CAPSULE | Freq: Two times a day (BID) | ORAL | 1 refills | Status: AC

## 2019-03-26 MED ORDER — ASPIRIN EC 81 MG PO TBEC: 81.0000 mg | DELAYED_RELEASE_TABLET | Freq: Every day | ORAL | 1 refills | Status: AC

## 2019-03-26 MED ORDER — DULOXETINE HCL 60 MG PO CPEP: 120.0000 mg | ORAL_CAPSULE | Freq: Every day | ORAL | 1 refills | Status: DC

## 2019-03-26 MED ORDER — MORPHINE SULFATE ER 15 MG PO TBCR: 15.0000 mg | EXTENDED_RELEASE_TABLET | Freq: Two times a day (BID) | ORAL | 0 refills | Status: DC

## 2019-03-26 NOTE — Telephone Encounter (Signed)
Spoke to [facility] nurse with Hospice who states there is concern at the home with conflicting stories and count when nurse visits and counts lockbox and med tray. The room mate states that patient asks for 2-3 pain pills more daily when hurting and Homer denies that. After we discussed ideas she does agree that long acting at least will not deliver immediate pain relief fast acting preventing anyone from having to ask for more meds and keeping count the same. I fear as well that he may not be getting his pain meds and unless has labs to check we would not know really but at least this may minimize the danger of that. Pain remains at 8 daily but she did say there is always a lot of arguing and yelling and that patient states he hasnt been getting the meds.

## 2019-03-26 NOTE — Telephone Encounter (Signed)
DC Morphine 15mg  IR  Sent rx MS Contin 15mg  BID q 12 hr / #30 pills for 15 day or 2 week supply.  [name], [facility] 03/26/2019, 3:08 PM

## 2019-03-26 NOTE — Telephone Encounter (Signed)
Received request today  hospice nurse Tammi Klippel [phone] wants all this refill for patient and also asking if morphine short acting can be switched to long acting one.  ---------------------------  Attempted to call back but did not reach her, and mailbox was full could not leave message.  Could you attempt to call again when available?  I would need to confirm with them his current dose:  It should be Morphine IR 15mg  one tablet every 8 hours or 3 a day for 45mg  in 24 hours.  If we switch to long acting morphine, it would be for only one pill twice a day or every 12 hours, we will no longer rx the immediate relief pain medicine.  It would be one long acting MS Contin pill every 12 hours.  Let me know if she is okay with this and when to send rx.   [name], [facility] 03/26/2019, 12:49 PM

## 2019-03-27 NOTE — Telephone Encounter (Signed)
Received message that Hospice RN called back and requested med change from local [facility] pharmacy to [facility].  Will need to call hospice tomorrow to discuss this rx plan.  David Hall, [facility] 03/27/2019, 5:58 PM

## 2019-03-28 MED ORDER — MORPHINE SULFATE ER 15 MG PO TBCR: 15.0000 mg | EXTENDED_RELEASE_TABLET | Freq: Two times a day (BID) | ORAL | 0 refills | Status: DC

## 2019-03-28 NOTE — Addendum Note (Signed)
Addended by: Olin Hauser on: 03/28/2019 10:47 AM   Modules accepted: Orders

## 2019-03-28 NOTE — Addendum Note (Signed)
Addended by: Olin Hauser on: 03/28/2019 12:53 PM   Modules accepted: Orders
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
## 2019-04-07 ENCOUNTER — Other Ambulatory Visit: Payer: Self-pay | Admitting: Family Medicine

## 2019-04-07 DIAGNOSIS — K219 Gastro-esophageal reflux disease without esophagitis: Secondary | ICD-10-CM

## 2019-04-07 DIAGNOSIS — F319 Bipolar disorder, unspecified: Secondary | ICD-10-CM

## 2019-04-11 ENCOUNTER — Other Ambulatory Visit: Payer: Self-pay | Admitting: Family Medicine

## 2019-04-11 DIAGNOSIS — G890 Central pain syndrome: Secondary | ICD-10-CM

## 2019-04-11 DIAGNOSIS — Z515 Encounter for palliative care: Secondary | ICD-10-CM

## 2019-04-11 MED ORDER — MORPHINE SULFATE ER 15 MG PO TBCR: 15.0000 mg | EXTENDED_RELEASE_TABLET | Freq: Two times a day (BID) | ORAL | 0 refills | Status: DC

## 2019-04-25 ENCOUNTER — Other Ambulatory Visit: Payer: Self-pay | Admitting: Family Medicine

## 2019-04-25 DIAGNOSIS — Z515 Encounter for palliative care: Secondary | ICD-10-CM

## 2019-04-25 DIAGNOSIS — G890 Central pain syndrome: Secondary | ICD-10-CM

## 2019-04-25 MED ORDER — MORPHINE SULFATE ER 15 MG PO TBCR: 15.0000 mg | EXTENDED_RELEASE_TABLET | Freq: Two times a day (BID) | ORAL | 0 refills | Status: DC

## 2019-04-25 NOTE — Telephone Encounter (Signed)
Anderson Malta RN with  Hospice called requesting refill on 15 mg morphine called into [facility]. Anderson Malta call back # is  [phone]

## 2019-04-25 NOTE — Telephone Encounter (Signed)
PDMP reviewed and reviewed past notes from Dr. Elwanda [place] dates are 8/17 from [facility] despite attempt from this office to cancel that Rx and 8/19 from [facility].    Anderson Malta confirms not having seen [facility] prescription from 8/17 in patient's home and likely suspects diversion.  For today, New Rx will be sent to [facility] for 15 days.  Med will be delivered and handed to the patient tonight.  Anderson Malta, RN hospice has an appointment to visit patient tonight.  There is also a lock box in the home and no diversion has been noted to date since last fill when Anderson Malta started putting this medication in patient's pill box.

## 2019-05-23 ENCOUNTER — Other Ambulatory Visit: Payer: Self-pay | Admitting: Family Medicine

## 2019-05-23 DIAGNOSIS — J432 Centrilobular emphysema: Secondary | ICD-10-CM

## 2019-05-29 ENCOUNTER — Telehealth: Payer: Self-pay | Admitting: Family Medicine

## 2019-05-29 DIAGNOSIS — G890 Central pain syndrome: Secondary | ICD-10-CM

## 2019-05-29 DIAGNOSIS — Z515 Encounter for palliative care: Secondary | ICD-10-CM

## 2019-05-29 MED ORDER — MORPHINE SULFATE ER 15 MG PO TBCR: 15.0000 mg | EXTENDED_RELEASE_TABLET | Freq: Two times a day (BID) | ORAL | 0 refills | Status: DC

## 2019-05-29 NOTE — Telephone Encounter (Signed)
Can you call hospice back and confirm the current dose on Morphine MS Contin? Previous was 15mg  every 12 hours, for 30 pills or 2 week supply. But last one does say it was dose of 30mg  every 12 hours.  Can you clarify with Hospice on what is correct dosage of his Morphine MS Contin pill and how often he is taking it? Thanks.  [name], [facility] 05/29/2019, 2:26 PM

## 2019-05-29 NOTE — Telephone Encounter (Signed)
Sent rx Morphine MS Contin 15mg  BID q 12 hrs, 30 pills for 2 weeks upply.  If needs to be increase we can change to 30 twice a day only with recommendation of hospice. Otherwise, I am not clear on the reason for last dosage increase.  [name], [facility] 05/29/2019, 3:36 PM

## 2019-05-29 NOTE — Telephone Encounter (Signed)
Mitzy from hospice requesting morphine refill for pt before pharmacy close at 7 pm

## 2019-05-29 NOTE — Telephone Encounter (Signed)
Mitzi [phone] was covering for other nurse and his chart shows 30 mg from previous Hx she is not too sure, she also want Albuterol Rx to be send to the [facility].

## 2019-06-07 ENCOUNTER — Other Ambulatory Visit: Payer: Self-pay | Admitting: Family Medicine

## 2019-06-07 DIAGNOSIS — I129 Hypertensive chronic kidney disease with stage 1 through stage 4 chronic kidney disease, or unspecified chronic kidney disease: Secondary | ICD-10-CM

## 2019-06-07 DIAGNOSIS — N182 Chronic kidney disease, stage 2 (mild): Secondary | ICD-10-CM

## 2019-06-07 DIAGNOSIS — G890 Central pain syndrome: Secondary | ICD-10-CM

## 2019-06-07 DIAGNOSIS — F319 Bipolar disorder, unspecified: Secondary | ICD-10-CM

## 2019-06-08 ENCOUNTER — Other Ambulatory Visit: Payer: Self-pay

## 2019-06-08 ENCOUNTER — Telehealth: Payer: Self-pay

## 2019-06-08 DIAGNOSIS — G890 Central pain syndrome: Secondary | ICD-10-CM

## 2019-06-08 DIAGNOSIS — Z515 Encounter for palliative care: Secondary | ICD-10-CM

## 2019-06-08 MED ORDER — MORPHINE SULFATE ER 30 MG PO TBCR: 30.0000 mg | EXTENDED_RELEASE_TABLET | Freq: Two times a day (BID) | ORAL | 0 refills | Status: DC

## 2019-06-08 NOTE — Telephone Encounter (Signed)
Last refill of Morphine was sent as 15mg  as we did not have a record of the 30mg  dose. We called hospice and asked to check their records and they were not able to clarify with [place] if it was 15 or 30 mg.  Now I will go ahead and send the 30mg  dose as requested. Rx sent to [place], [facility] 06/08/2019, 6:25 PM

## 2019-06-08 NOTE — Telephone Encounter (Signed)
I spoke with Anderson Malta, RN of Hospice who called to request a increase on the pt Morphine. She state that the prescription was increase to 30 MG  prior, but the last refill was sent over for 15MG . Anderson Malta state that the patient is declining/ dying and need the medication to control his pain. She state that there is no more disrepancies with his medication, because she picks up the prescription and manages the medication.

## 2019-06-09 NOTE — Telephone Encounter (Signed)
Left detail message on her personal voice mail with explanation about dosage.

## 2019-06-22 ENCOUNTER — Other Ambulatory Visit: Payer: Self-pay | Admitting: Family Medicine

## 2019-06-22 DIAGNOSIS — Z515 Encounter for palliative care: Secondary | ICD-10-CM

## 2019-06-22 DIAGNOSIS — G890 Central pain syndrome: Secondary | ICD-10-CM

## 2019-06-25 ENCOUNTER — Other Ambulatory Visit: Payer: Self-pay | Admitting: Family Medicine

## 2019-06-25 NOTE — Telephone Encounter (Signed)
Checked PDMP, last filled morphine MS contin 30mg  was on 06/08/19  Last rx refill was already sent was on 06/22/19.  I do not see that last rx filled by pharmacy yet.  Can you call hospice back to clarify what the question or problem is?  [facility] should have the rx sent on 06/22/19.  [name], DO [facility] 06/25/2019, 5:18 PM

## 2019-06-25 NOTE — Telephone Encounter (Signed)
Received a call from Hospice ? requesting refill on MS  Contin 30 mg called into  [facility]

## 2019-06-26 NOTE — Telephone Encounter (Signed)
I attempted to contact Anderson Malta, RN for palliative care, no answer. LMOM to return my call.

## 2019-06-27 NOTE — Telephone Encounter (Signed)
Pharmacy error they received his Rx.

## 2019-06-29 ENCOUNTER — Telehealth: Payer: Self-pay | Admitting: Family Medicine

## 2019-06-29 NOTE — Telephone Encounter (Signed)
Anderson Malta with hospice called  [phone] said that she need a verbal

## 2019-06-29 NOTE — Telephone Encounter (Signed)
Agree. Verbal certification.  Let me know if they need any paperwork.  [name], [facility] 06/29/2019, 12:40 PM

## 2019-06-29 NOTE — Telephone Encounter (Signed)
Verbal given to re certify  Hospice service.

## 2019-07-04 ENCOUNTER — Other Ambulatory Visit: Payer: Self-pay | Admitting: Family Medicine

## 2019-07-04 DIAGNOSIS — G890 Central pain syndrome: Secondary | ICD-10-CM

## 2019-07-04 DIAGNOSIS — Z515 Encounter for palliative care: Secondary | ICD-10-CM

## 2019-07-14 ENCOUNTER — Other Ambulatory Visit: Payer: Self-pay

## 2019-07-14 ENCOUNTER — Emergency Department

## 2019-07-14 ENCOUNTER — Emergency Department
Admission: EM | Admit: 2019-07-14 | Discharge: 2019-07-14 | Disposition: A | Discharge status: Home / Self Care | Attending: Emergency Medicine | Admitting: Emergency Medicine

## 2019-07-14 DIAGNOSIS — F17210 Nicotine dependence, cigarettes, uncomplicated: Secondary | ICD-10-CM | POA: Insufficient documentation

## 2019-07-14 DIAGNOSIS — N182 Chronic kidney disease, stage 2 (mild): Secondary | ICD-10-CM | POA: Diagnosis not present

## 2019-07-14 DIAGNOSIS — J690 Pneumonitis due to inhalation of food and vomit: Secondary | ICD-10-CM | POA: Diagnosis not present

## 2019-07-14 DIAGNOSIS — R0602 Shortness of breath: Secondary | ICD-10-CM | POA: Insufficient documentation

## 2019-07-14 DIAGNOSIS — I129 Hypertensive chronic kidney disease with stage 1 through stage 4 chronic kidney disease, or unspecified chronic kidney disease: Secondary | ICD-10-CM | POA: Insufficient documentation

## 2019-07-14 DIAGNOSIS — D72829 Elevated white blood cell count, unspecified: Secondary | ICD-10-CM | POA: Diagnosis not present

## 2019-07-14 DIAGNOSIS — Z7982 Long term (current) use of aspirin: Secondary | ICD-10-CM | POA: Insufficient documentation

## 2019-07-14 DIAGNOSIS — I69951 Hemiplegia and hemiparesis following unspecified cerebrovascular disease affecting right dominant side: Secondary | ICD-10-CM | POA: Diagnosis not present

## 2019-07-14 DIAGNOSIS — Z20828 Contact with and (suspected) exposure to other viral communicable diseases: Secondary | ICD-10-CM | POA: Insufficient documentation

## 2019-07-14 DIAGNOSIS — Z66 Do not resuscitate: Secondary | ICD-10-CM | POA: Diagnosis not present

## 2019-07-14 DIAGNOSIS — Z79899 Other long term (current) drug therapy: Secondary | ICD-10-CM | POA: Diagnosis not present

## 2019-07-14 DIAGNOSIS — T17908A Unspecified foreign body in respiratory tract, part unspecified causing other injury, initial encounter: Secondary | ICD-10-CM

## 2019-07-14 LAB — URINALYSIS, COMPLETE (UACMP) WITH MICROSCOPIC
Bilirubin Urine: NEGATIVE
Glucose, UA: NEGATIVE mg/dL
Ketones, ur: NEGATIVE mg/dL
Leukocytes,Ua: NEGATIVE
Nitrite: NEGATIVE
Protein, ur: NEGATIVE mg/dL
Specific Gravity, Urine: 1.01 (ref 1.005–1.030)
Squamous Epithelial / HPF: NONE SEEN (ref 0–5)
pH: 7 (ref 5.0–8.0)

## 2019-07-14 LAB — TROPONIN I (HIGH SENSITIVITY)
Troponin I (High Sensitivity): 14 ng/L (ref <18)
Troponin I (High Sensitivity): 15 ng/L (ref <18)

## 2019-07-14 LAB — CBC WITH DIFFERENTIAL/PLATELET
Abs Immature Granulocytes: 0.25 10*3/uL — ABNORMAL HIGH (ref 0.00–0.07)
Basophils Absolute: 0.1 10*3/uL (ref 0.0–0.1)
Basophils Relative: 0 %
Eosinophils Absolute: 0 10*3/uL (ref 0.0–0.5)
Eosinophils Relative: 0 %
HCT: 38 % — ABNORMAL LOW (ref 39.0–52.0)
Hemoglobin: 13.2 g/dL (ref 13.0–17.0)
Immature Granulocytes: 1 %
Lymphocytes Relative: 5 %
Lymphs Abs: 1.5 10*3/uL (ref 0.7–4.0)
MCH: 28 pg (ref 26.0–34.0)
MCHC: 34.7 g/dL (ref 30.0–36.0)
MCV: 80.5 fL (ref 80.0–100.0)
Monocytes Absolute: 2.1 10*3/uL — ABNORMAL HIGH (ref 0.1–1.0)
Monocytes Relative: 7 %
Neutro Abs: 24.6 10*3/uL — ABNORMAL HIGH (ref 1.7–7.7)
Neutrophils Relative %: 87 %
Platelets: 377 10*3/uL (ref 150–400)
RBC: 4.72 MIL/uL (ref 4.22–5.81)
RDW: 16.7 % — ABNORMAL HIGH (ref 11.5–15.5)
WBC: 28.5 10*3/uL — ABNORMAL HIGH (ref 4.0–10.5)
nRBC: 0 % (ref 0.0–0.2)

## 2019-07-14 LAB — COMPREHENSIVE METABOLIC PANEL
ALT: 9 U/L (ref 0–44)
AST: 15 U/L (ref 15–41)
Albumin: 3.8 g/dL (ref 3.5–5.0)
Alkaline Phosphatase: 74 U/L (ref 38–126)
Anion gap: 11 (ref 5–15)
BUN: 11 mg/dL (ref 8–23)
CO2: 25 mmol/L (ref 22–32)
Calcium: 8.4 mg/dL — ABNORMAL LOW (ref 8.9–10.3)
Chloride: 95 mmol/L — ABNORMAL LOW (ref 98–111)
Creatinine, Ser: 0.71 mg/dL (ref 0.61–1.24)
GFR calc Af Amer: >60 mL/min (ref >60)
GFR calc non Af Amer: >60 mL/min (ref >60)
Glucose, Bld: 120 mg/dL — ABNORMAL HIGH (ref 70–99)
Potassium: 2.8 mmol/L — ABNORMAL LOW (ref 3.5–5.1)
Sodium: 131 mmol/L — ABNORMAL LOW (ref 135–145)
Total Bilirubin: 0.5 mg/dL (ref 0.3–1.2)
Total Protein: 7.3 g/dL (ref 6.5–8.1)

## 2019-07-14 LAB — POC SARS CORONAVIRUS 2 AG: SARS Coronavirus 2 Ag: NEGATIVE

## 2019-07-14 LAB — BLOOD GAS, VENOUS
Acid-Base Excess: 4.5 mmol/L — ABNORMAL HIGH (ref 0.0–2.0)
Bicarbonate: 29.8 mmol/L — ABNORMAL HIGH (ref 20.0–28.0)
O2 Saturation: 81.7 %
Patient temperature: 37
pCO2, Ven: 46 mmHg (ref 44.0–60.0)
pH, Ven: 7.42 (ref 7.250–7.430)
pO2, Ven: 45 mmHg (ref 32.0–45.0)

## 2019-07-14 LAB — LACTIC ACID, PLASMA
Lactic Acid, Venous: 1.9 mmol/L (ref 0.5–1.9)
Lactic Acid, Venous: 2.8 mmol/L (ref 0.5–1.9)

## 2019-07-14 MED ORDER — METHYLPREDNISOLONE SODIUM SUCC 125 MG IJ SOLR
125.0000 mg | Freq: Once | INTRAMUSCULAR | Status: AC
  Administered 2019-07-14: 125 mg via INTRAMUSCULAR
  Filled 2019-07-14: qty 2

## 2019-07-14 MED ORDER — LORAZEPAM 2 MG/ML IJ SOLN
0.5000 mg | Freq: Once | INTRAMUSCULAR | Status: AC
  Administered 2019-07-14: 0.5 mg via INTRAVENOUS
  Filled 2019-07-14: qty 1

## 2019-07-14 MED ORDER — SODIUM CHLORIDE 0.9 % IV SOLN
1.0000 g | Freq: Once | INTRAVENOUS | Status: AC
  Administered 2019-07-14: 1 g via INTRAVENOUS
  Filled 2019-07-14: qty 10

## 2019-07-14 MED ORDER — PREDNISONE 10 MG PO TABS: 50.0000 mg | ORAL_TABLET | Freq: Every day | ORAL | 0 refills | Status: AC

## 2019-07-14 MED ORDER — AZITHROMYCIN 250 MG PO TABS: ORAL_TABLET | ORAL | 0 refills | Status: AC

## 2019-07-14 NOTE — ED Notes (Signed)
Pt assisted to toilet. Pt provided meal tray. Pt removed cardiac monitor.

## 2019-07-14 NOTE — ED Notes (Signed)
Date and time results received: 07/14/19 2100 (use smartphrase ".now" to insert current time)  Test: Lactid acid  Critical Value: 2.8  Name of Provider Notified: Vladimir Crofts, NP   Orders Received? Or Actions Taken?: Acknowledge

## 2019-07-14 NOTE — ED Notes (Signed)
VBG sent to lab, respiratory therapist notified.

## 2019-07-14 NOTE — ED Provider Notes (Signed)
[facility] Emergency Department Provider Note ____________________________________________   None    (approximate)  I have reviewed the triage vital signs and the nursing notes.   HISTORY  Chief Complaint Shortness of Breath  HPI David Hall. is a 63 y.o. male presents to the emergency department from home due to productive cough and shortness of breath.  Patient is a hospice patient with a history of chronic respiratory failure with hypercapnia.  Patient states that the shortness of breath got worse last night and the productive cough started today.  No known fever.  He is anxious and states that he has not had his pain medications.         Past Medical History:  Diagnosis Date  . Bipolar disorder ([place])   . GERD (gastroesophageal reflux disease)   . Hyperlipidemia   . Hypertension   . Insomnia, controlled   . Obesity   . Oxygen deficiency    2 LNC at night  . Shortness of breath   . Stroke [facility]) 2014   R sided hemiplegia  . Substance abuse ([place]) 2016   cocaine use.    Patient Active Problem List   Diagnosis Date Noted  . Pneumonia 12/20/2018  . Protein-calorie malnutrition, severe ([place]) 02/13/2018  . Atrophy of muscle of multiple sites 02/13/2018  . Generalized weakness 02/13/2018  . Hospice care patient 11/12/2017  . History of fracture of right hip 09/21/2017  . Hypokalemia 04/19/2017  . GERD (gastroesophageal reflux disease) 12/10/2015  . 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 Right 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) ([place]) 10/27/2015  . Abnormal MRI of head 10/27/2015  . Memory loss 07/25/2015  . Faintness 07/25/2015  . On home oxygen therapy 04/02/2015  . Bipolar 1 disorder, depressed ([place]) 01/27/2015  . Pulmonary nodule 01/27/2015  . Blood glucose elevated 01/27/2015  . Restless leg 01/27/2015  . Compulsive tobacco user syndrome 01/27/2015  . Arthritis of knee,  degenerative 01/27/2015  . Benign hypertension with CKD (chronic kidney disease), stage II 01/27/2015  . Cocaine abuse ([place]) 01/27/2015  . End stage COPD ([place]) 01/27/2015  . Chronic respiratory failure ([place]) 11/12/2014  . Panlobular emphysema ([place]) 11/12/2014  . Recurrent falls 05/29/2014  . Cervical pain 05/29/2014  . Bursitis of elbow 05/29/2014  . Dislocated shoulder 05/21/2014  . Lung mass 05/21/2014  . Dyslipidemia 10/11/2013  . History of CVA with residual deficit 09/28/2013    Past Surgical History:  Procedure Laterality Date  . CARPAL TUNNEL RELEASE    . INTRAMEDULLARY (IM) NAIL INTERTROCHANTERIC Right 09/21/2017   Procedure: INTRAMEDULLARY (IM) NAIL INTERTROCHANTRIC;  Surgeon: Corky Mull, MD;  Location: [facility] ORS;  Service: Orthopedics;  Laterality: Right;  . TONSILLECTOMY      Prior to Admission medications   Medication Sig Start Date End Date Taking? Authorizing Provider  amLODipine (NORVASC) 5 MG tablet TAKE 1 TABLET(5 MG) BY MOUTH DAILY Patient taking differently: Take 5 mg by mouth daily.  06/07/19  Yes Karamalegos, Devonne Doughty, DO  aspirin EC 81 MG tablet Take 1 tablet (81 mg total) by mouth daily. 03/26/19  Yes Karamalegos, Devonne Doughty, DO  DULoxetine (CYMBALTA) 60 MG capsule TAKE 2 CAPSULES BY MOUTH DAILY Patient taking differently: Take 120 mg by mouth daily.  06/07/19  Yes Karamalegos, Devonne Doughty, DO  gabapentin (NEURONTIN) 300 MG capsule Take 2 capsules (600 mg total) by mouth 2 (two) times daily. 03/26/19  Yes Karamalegos, Devonne Doughty, DO  Melatonin 10 MG  CAPS Take 10 mg by mouth at bedtime.   Yes [provider]  metoprolol tartrate (LOPRESSOR) 25 MG tablet TAKE 1 TABLET(25 MG) BY MOUTH TWICE DAILY Patient taking differently: Take 25 mg by mouth 2 (two) times daily.  06/07/19  Yes Karamalegos, Devonne Doughty, DO  morphine (MS CONTIN) 30 MG 12 hr tablet Take 1 tablet (30 mg total) by mouth every 12 (twelve) hours. 07/04/19  Yes Karamalegos, Devonne Doughty, DO   omeprazole (PRILOSEC) 20 MG capsule TAKE 1 CAPSULE BY MOUTH TWICE DAILY Patient taking differently: Take 20 mg by mouth 2 (two) times daily before a meal.  04/08/19  Yes Karamalegos, Alexander J, DO  risperiDONE (RISPERDAL) 1 MG tablet TAKE 1 TABLET(1 MG) BY MOUTH AT BEDTIME Patient taking differently: Take 1 mg by mouth at bedtime.  06/07/19  Yes Karamalegos, Devonne Doughty, DO  terazosin (HYTRIN) 1 MG capsule TAKE 1 CAPSULE(1 MG) BY MOUTH AT BEDTIME Patient taking differently: Take 1 mg by mouth at bedtime.  06/07/19  Yes Karamalegos, Devonne Doughty, DO  thiamine 250 MG tablet Take 1 tablet (250 mg total) by mouth daily. Patient taking differently: Take 250 mg by mouth daily. Per G-tube 12/05/18  Yes Karamalegos, Devonne Doughty, DO  trazodone (DESYREL) 300 MG tablet TAKE 1 TABLET BY MOUTH AT BEDTIME Patient taking differently: Take 150 mg by mouth at bedtime.  04/08/19  Yes Karamalegos, Alexander J, DO  albuterol (VENTOLIN HFA) 108 (90 Base) MCG/ACT inhaler INHALE 2 PUFFS INTO THE LUNGS FOUR TIMES DAILY AS NEEDED Patient taking differently: Inhale 2 puffs into the lungs every 4 (four) hours as needed for wheezing or shortness of breath.  05/23/19   Karamalegos, Devonne Doughty, DO  atorvastatin (LIPITOR) 80 MG tablet Take 1 tablet (80 mg total) by mouth at bedtime. Patient not taking: Reported on 07/14/2019 02/28/19   Olin Hauser, DO  azithromycin (ZITHROMAX) 250 MG tablet 2 tablets today, then 1 tablet for the next 4 days. 07/14/19   Londen Lorge B, FNP  predniSONE (DELTASONE) 10 MG tablet Take 5 tablets (50 mg total) by mouth daily. 07/14/19   Efraim Vanallen, Johnette Abraham B, FNP    Allergies Quetiapine, Seroquel [quetiapine fumarate], and Codeine  Family History  Problem Relation Age of Onset  . CAD Mother   . Hypertension Mother   . Stroke Mother   . Lung disease Father     Social History Social History   Tobacco Use  . Smoking status: Current Every Day Smoker    Packs/day: 1.50    Years:  45.00    Pack years: 67.50    Types: Cigarettes  . Smokeless tobacco: Current User  . Tobacco comment: smoking 10-15 cigs daily   Substance Use Topics  . Alcohol use: No    Alcohol/week: 0.0 standard drinks    Frequency: Never  . Drug use: No    Review of Systems  Constitutional: No fever/chills Eyes: No visual changes. ENT: No sore throat. Cardiovascular: Denies chest pain. Respiratory: Positive for shortness of breath. Gastrointestinal: No abdominal pain.  No nausea, no vomiting.  No diarrhea.  No constipation. Genitourinary: Negative for dysuria. Musculoskeletal: Negative for back pain. Skin: Negative for rash. Neurological: Negative for headaches, focal weakness or numbness. ___________________________________________   PHYSICAL EXAM:  VITAL SIGNS: ED Triage Vitals  Enc Vitals Group     BP 07/14/19 1822 (!) 172/83     Pulse Rate 07/14/19 1822 (!) 106     Resp 07/14/19 1822 (!) 27     Temp 07/14/19  1822 99.4 F (37.4 C)     Temp Source 07/14/19 1822 Oral     SpO2 07/14/19 1822 100 %     Weight 07/14/19 1824 135 lb (61.2 kg)     Height 07/14/19 1824 5\' 10"  (1.778 m)     Head Circumference --      Peak Flow --      Pain Score 07/14/19 1824 8     Pain Loc --      Pain Edu? --      Excl. in [place]? --     Constitutional: Alert and oriented. Chronically ill appearing and in no acute distress. Eyes: Conjunctivae are normal Head: Atraumatic. Nose: No congestion/rhinnorhea. Mouth/Throat: Mucous membranes are dry.  Oropharynx non-erythematous. Neck: No stridor.   Hematological/Lymphatic/Immunilogical: No cervical lymphadenopathy. Cardiovascular: Normal rate, regular rhythm. Grossly normal heart sounds.  Good peripheral circulation. Respiratory: Normal respiratory effort.  No retractions. Diminished throughout. Gastrointestinal: Soft. No distention. No abdominal bruits. Genitourinary:  Musculoskeletal: No lower extremity tenderness nor edema.  No joint effusions.  Neurologic:  Normal speech and language. No gross focal neurologic deficits are appreciated. No gait instability. Skin:  Skin is warm, dry and intact. No rash noted. Psychiatric: Mood and affect are normal. Speech and behavior are normal.  ____________________________________________   LABS (all labs ordered are listed, but only abnormal results are displayed)  Labs Reviewed  COMPREHENSIVE METABOLIC PANEL - Abnormal; Notable for the following components:      Result Value   Sodium 131 (*)    Potassium 2.8 (*)    Chloride 95 (*)    Glucose, Bld 120 (*)    Calcium 8.4 (*)    All other components within normal limits  CBC WITH DIFFERENTIAL/PLATELET - Abnormal; Notable for the following components:   WBC 28.5 (*)    HCT 38.0 (*)    RDW 16.7 (*)    Neutro Abs 24.6 (*)    Monocytes Absolute 2.1 (*)    Abs Immature Granulocytes 0.25 (*)    All other components within normal limits  URINALYSIS, COMPLETE (UACMP) WITH MICROSCOPIC - Abnormal; Notable for the following components:   Color, Urine YELLOW (*)    APPearance CLEAR (*)    Hgb urine dipstick SMALL (*)    Bacteria, UA RARE (*)    All other components within normal limits  LACTIC ACID, PLASMA - Abnormal; Notable for the following components:   Lactic Acid, Venous 2.8 (*)    All other components within normal limits  BLOOD GAS, VENOUS - Abnormal; Notable for the following components:   Bicarbonate 29.8 (*)    Acid-Base Excess 4.5 (*)    All other components within normal limits  LACTIC ACID, PLASMA  POC SARS CORONAVIRUS 2 AG -  ED  POC SARS CORONAVIRUS 2 AG  TROPONIN I (HIGH SENSITIVITY)  TROPONIN I (HIGH SENSITIVITY)   ____________________________________________  EKG  ED ECG REPORT I, Yousra Ivens, FNP-BC personally viewed and interpreted this ECG.   Date: 07/14/2019  EKG Time: 1823  Rate: 106  Rhythm: sinus tachycardia  Axis: normal  Intervals:none  ST&T Change: no ST elevation   ____________________________________________  RADIOLOGY  ED MD interpretation:    No acute findings.  Official radiology report(s): Dg Chest Portable 1 View  Result Date: 07/14/2019 CLINICAL DATA:  Shortness of breath since last night, coughing up copious amounts of phlegm, history hypertension, stroke, smoker, emphysema EXAM: PORTABLE CHEST 1 VIEW COMPARISON:  The portable exam 1826 hours compared to 12/20/2018 FINDINGS: Volume loss  in the RIGHT hemithorax with mediastinal shift to the RIGHT. Normal heart size and pulmonary vascularity. Emphysematous changes with extensive scarring and volume loss in the RIGHT lower lung. Improved RIGHT lung infiltrates since prior study. No acute infiltrate, pleural effusion or pneumothorax. Bones demineralized. IMPRESSION: Emphysematous changes with volume loss and basilar scarring in RIGHT lung. No acute abnormalities. Electronically Signed   By: Lavonia Dana M.D.   On: 07/14/2019 18:51    ____________________________________________   PROCEDURES  Procedure(s) performed (including Critical Care):  Procedures  ____________________________________________   INITIAL IMPRESSION / ASSESSMENT AND PLAN     63 year old male presenting to the emergency department for treatment and evaluation of shortness of breath.  He has a longstanding history of aspiration pneumonia and chronic respiratory failure with hypercapnia.  See HPI for further details.  DIFFERENTIAL DIAGNOSIS  COVID-19, aspiration pneumonia, acute on chronic respiratory failure with hypercapnia  ED COURSE  David Hall with hospice returned page. She states that other than the DNR, he has no other special care instructions. She states that he makes his own health care decisions.  ----------------------------------------- 7:42 PM on 07/14/2019 -----------------------------------------  Chest x-ray does not show any acute abnormality, however patient has a white blood cell count of 28.5 with  a neutrophil count of 24.6 and monocytes of 2.1.  He will be covered with antibiotics.  After discussing admission with the patient, he states that he would like to have IV antibiotics tonight and then discharged home with oral antibiotics.  He does not want to be admitted.  This seems reasonable since he is currently on hospice and is a DNR.  He was advised that his shortness of breath may worsen and he may develop respiratory distress which may lead to death, he states that he is well aware of this but still wants to go home.  Rocephin and Solu-Medrol have been ordered.  Awaiting Covid test.  ----------------------------------------- 9:11 PM on 07/14/2019 -----------------------------------------  COVID-19 test is negative.  The patient's repeat lactic acid is 2.8. Patient was advised that his blood tests show that his infection is getting worse. He refuses to stay and states that he is "about ready to go anyway" in reference to dying. He was advised that he may return to the ER if his symptoms worsen. He was also advised that he should stay in contact with his hospice nurse/staff. ____________________________________________   FINAL CLINICAL IMPRESSION(S) / ED DIAGNOSES  Final diagnoses:  Leukocytosis, unspecified type  SOB (shortness of breath)  Chronic pulmonary aspiration, initial encounter     ED Discharge Orders         Ordered    azithromycin (ZITHROMAX) 250 MG tablet     07/14/19 2048    predniSONE (DELTASONE) 10 MG tablet  Daily     07/14/19 2048           Note:  This document was prepared using Dragon voice recognition software and may include unintentional dictation errors.   Victorino Dike, FNP 07/14/19 2128    Carrie Mew, MD 07/14/19 2325

## 2019-07-14 NOTE — ED Notes (Signed)
Report given to Sylvia RN

## 2019-07-14 NOTE — ED Notes (Signed)
Provided pt with a warm blanket, pt's ride arrived informed pt will be discharge shorthly pt's rife left reported she will be back at 10:15pm, pt aware

## 2019-07-14 NOTE — ED Notes (Signed)
Pt provided blanket per request. Pt assisted to toilet.

## 2019-07-14 NOTE — ED Triage Notes (Signed)
Pt on hospice care at home, pt with c/o SOB since last night, coughing up copious amounts of phlegm per EMS

## 2019-08-10 DEATH — deceased
